# Patient Record
Sex: Female | Born: 1970
Health system: Southern US, Community
[De-identification: ages and names within clinical notes are randomized; demographics above are authoritative.]

## PROBLEM LIST (undated history)

## (undated) DIAGNOSIS — I214 Non-ST elevation (NSTEMI) myocardial infarction: Secondary | ICD-10-CM

## (undated) DIAGNOSIS — F419 Anxiety disorder, unspecified: Secondary | ICD-10-CM

## (undated) DIAGNOSIS — D649 Anemia, unspecified: Secondary | ICD-10-CM

## (undated) DIAGNOSIS — I2510 Atherosclerotic heart disease of native coronary artery without angina pectoris: Secondary | ICD-10-CM

## (undated) DIAGNOSIS — R102 Pelvic and perineal pain: Secondary | ICD-10-CM

## (undated) DIAGNOSIS — R011 Cardiac murmur, unspecified: Secondary | ICD-10-CM

## (undated) DIAGNOSIS — R519 Headache, unspecified: Secondary | ICD-10-CM

## (undated) DIAGNOSIS — I255 Ischemic cardiomyopathy: Secondary | ICD-10-CM

## (undated) DIAGNOSIS — T884XXA Failed or difficult intubation, initial encounter: Secondary | ICD-10-CM

## (undated) DIAGNOSIS — E786 Lipoprotein deficiency: Secondary | ICD-10-CM

## (undated) DIAGNOSIS — R51 Headache: Secondary | ICD-10-CM

## (undated) DIAGNOSIS — N920 Excessive and frequent menstruation with regular cycle: Secondary | ICD-10-CM

## (undated) HISTORY — PX: ABDOMINAL HYSTERECTOMY: SHX81

## (undated) HISTORY — DX: Ischemic cardiomyopathy: I25.5

## (undated) HISTORY — PX: OTHER SURGICAL HISTORY: SHX169

## (undated) HISTORY — DX: Non-ST elevation (NSTEMI) myocardial infarction: I21.4

## (undated) HISTORY — DX: Atherosclerotic heart disease of native coronary artery without angina pectoris: I25.10

## (undated) HISTORY — PX: WISDOM TOOTH EXTRACTION: SHX21

---

## 2008-04-05 ENCOUNTER — Emergency Department (HOSPITAL_COMMUNITY): Admission: EM | Admit: 2008-04-05 | Discharge: 2008-04-05 | Payer: Self-pay | Admitting: Emergency Medicine

## 2008-05-17 ENCOUNTER — Encounter: Admission: RE | Admit: 2008-05-17 | Discharge: 2008-05-17 | Payer: Self-pay | Admitting: Obstetrics and Gynecology

## 2008-06-15 ENCOUNTER — Encounter (INDEPENDENT_AMBULATORY_CARE_PROVIDER_SITE_OTHER): Payer: Self-pay | Admitting: Diagnostic Radiology

## 2008-06-15 ENCOUNTER — Ambulatory Visit (HOSPITAL_COMMUNITY): Admission: RE | Admit: 2008-06-15 | Discharge: 2008-06-15 | Payer: Self-pay | Admitting: Surgery

## 2008-08-08 ENCOUNTER — Encounter (INDEPENDENT_AMBULATORY_CARE_PROVIDER_SITE_OTHER): Payer: Self-pay | Admitting: Surgery

## 2008-08-08 ENCOUNTER — Ambulatory Visit (HOSPITAL_COMMUNITY): Admission: RE | Admit: 2008-08-08 | Discharge: 2008-08-09 | Payer: Self-pay | Admitting: Surgery

## 2009-02-02 IMAGING — US US SOFT TISSUE HEAD/NECK
1 series · 13 of 25 positions shown · non-contrast
Comparison: None.

CLINICAL DATA: Thyroid goiter.

THYROID ULTRASOUND
TECHNIQUE: Ultrasound examination of the thyroid gland and
adjacent soft tissues was performed.

[Series 1: us soft tissue head/neck · 0.11mm/px · 13 of 26 slices shown]
[im 1/26]
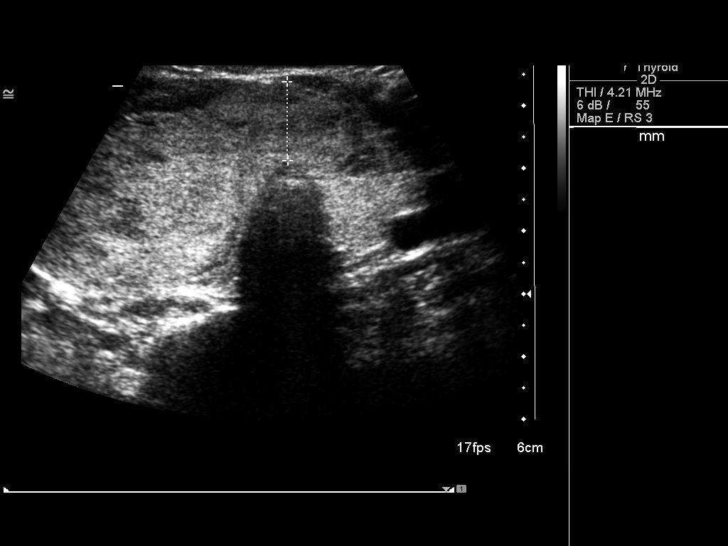
[im 3/26]
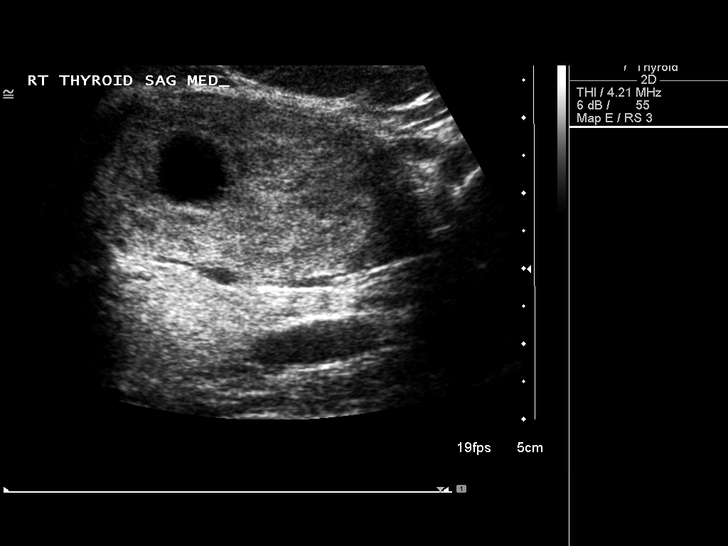
[im 5/26]
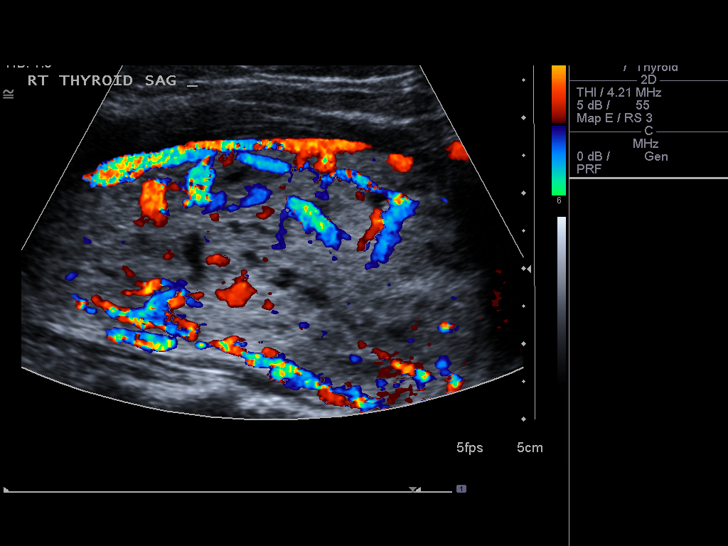
[im 7/26]
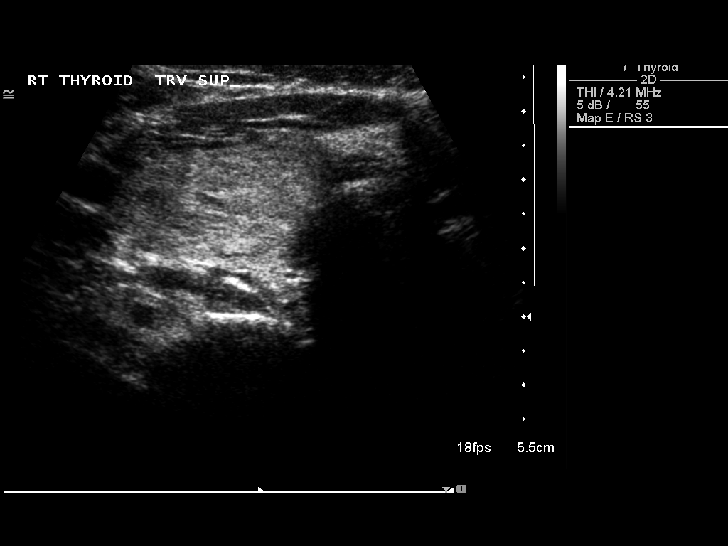
[im 9/26]
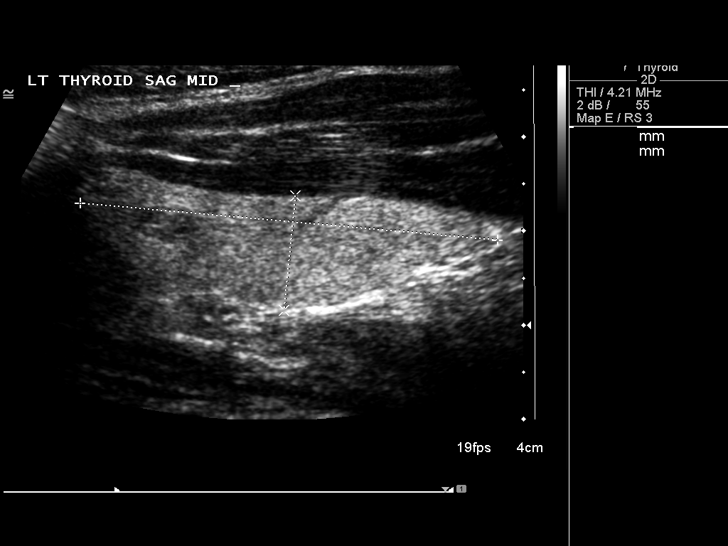
[im 11/26]
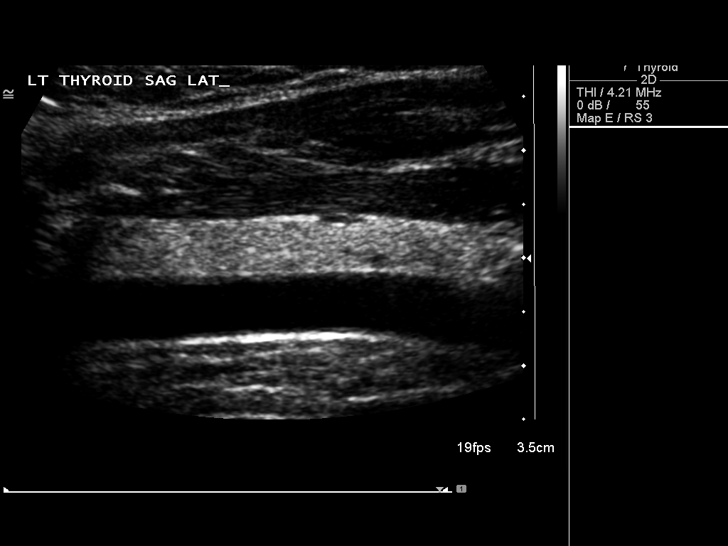
[im 13/26]
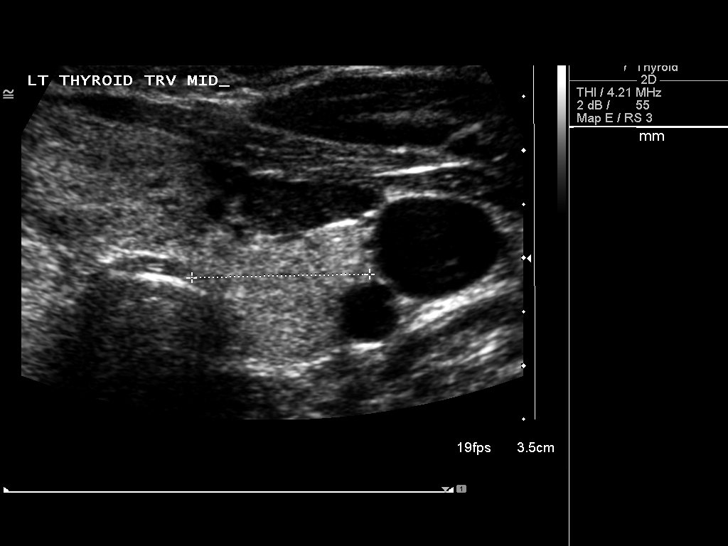
[im 15/26]
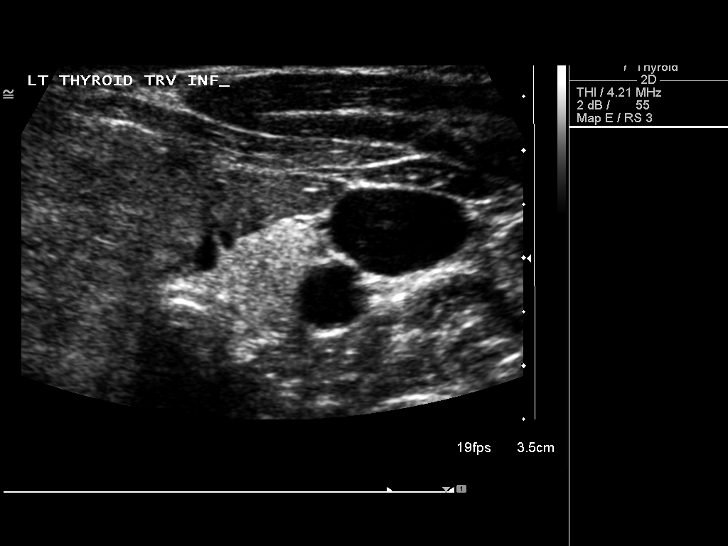
[im 17/26]
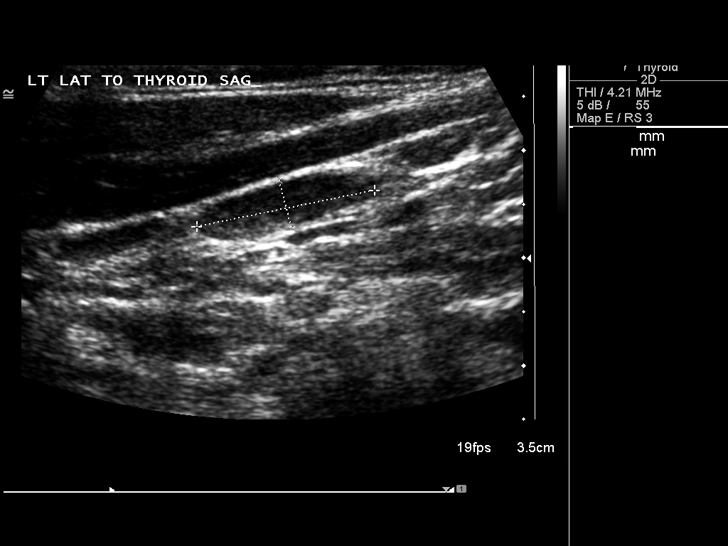
[im 19/26]
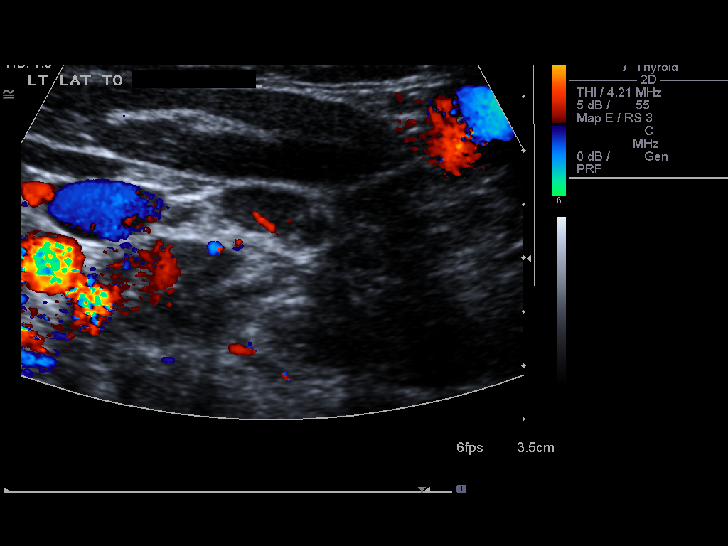
[im 21/26]
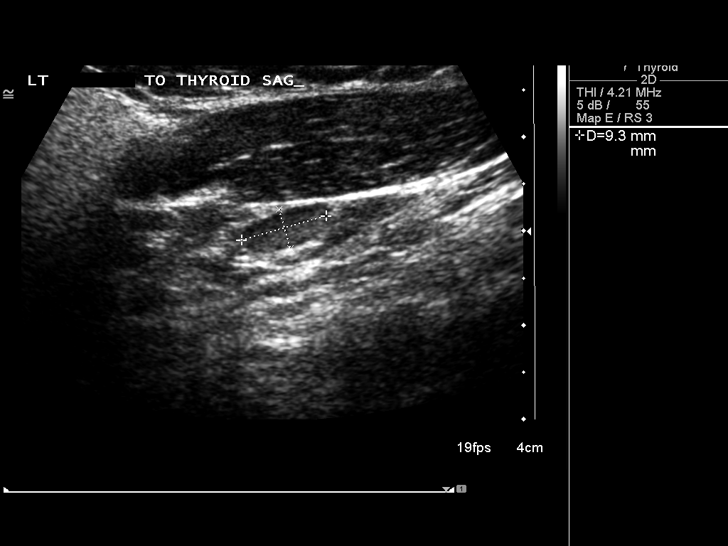
[im 23/26]
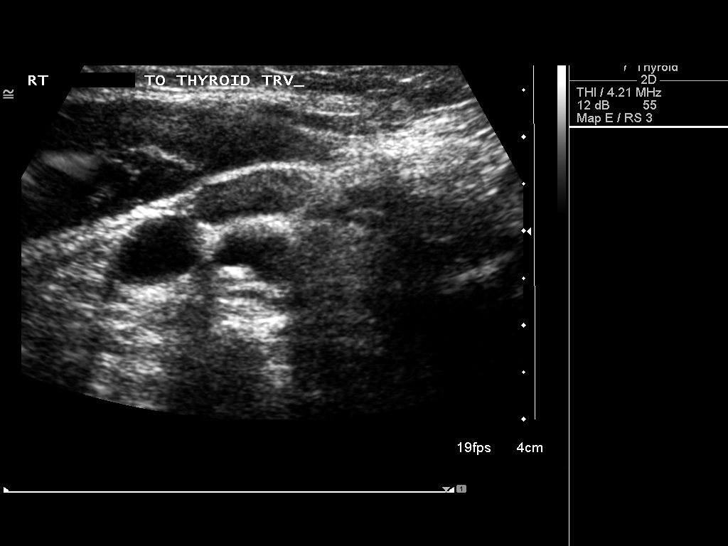
[im 26/26]
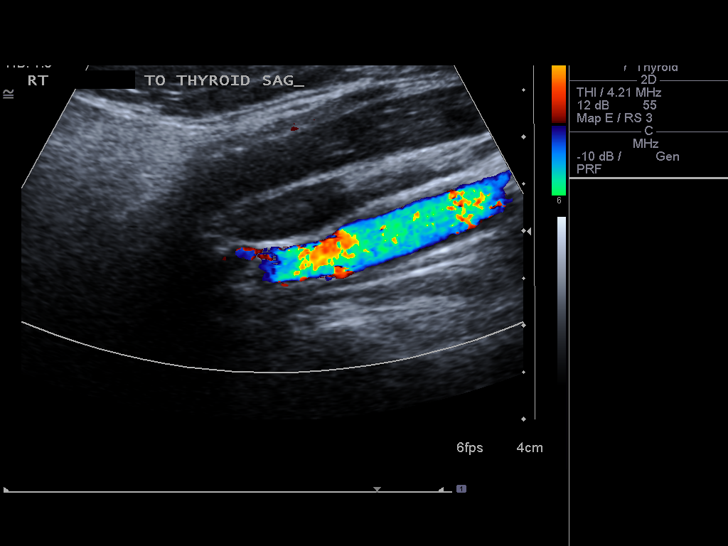

[13 of 25 positions shown; findings below may reference images not displayed]

FINDINGS: Right lobe of the thyroid gland measures 7.1 x 3.3 x
cm.  The left lobe of the thyroid gland measures 4.4 x 1.2 x
cm.  The isthmus measures 1.3 cm.

The entire right lobe of the thyroid gland (extending into the
isthmus) appears heterogeneous with different echogenicity than the
left lobe of the thyroid gland and may represent a dominant mass
replacing a majority of the right lobe.  Fine-needle aspirate
recommended to exclude malignancy.

Within the upper pole of the left lobe of the thyroid gland is a
0.6 x 0.4 x 0.6 cm solid-appearing lesion.  Stability of this can
be confirmed on follow-up.

Lateral to the left lobe of thyroid gland are not what appeared be
lymph nodes spanning over 0.9 and 1.7 cm in length with maximal
transverse dimension is 0.9 cm.
IMPRESSION: The entire right lobe of the thyroid gland (with extension into the
isthmus) appears heterogeneous and may represent a dominant solid
mass.  Fine-needle aspirate recommended to exclude malignancy.

Dominant left upper pole lesion measures up to 0.6 cm.  Stability
can be confirmed on follow-up.

## 2010-09-05 LAB — HEMOGLOBIN AND HEMATOCRIT, BLOOD
HCT: 33.8 % — ABNORMAL LOW (ref 36.0–46.0)
Hemoglobin: 10.9 g/dL — ABNORMAL LOW (ref 12.0–15.0)

## 2010-09-05 LAB — CBC
HCT: 27.5 % — ABNORMAL LOW (ref 36.0–46.0)
Hemoglobin: 9.1 g/dL — ABNORMAL LOW (ref 12.0–15.0)
MCHC: 33.1 g/dL (ref 30.0–36.0)
MCV: 78.3 fL (ref 78.0–100.0)
Platelets: 222 10*3/uL (ref 150–400)
WBC: 7.1 10*3/uL (ref 4.0–10.5)

## 2010-09-05 LAB — CALCIUM: Calcium: 8.1 mg/dL — ABNORMAL LOW (ref 8.4–10.5)

## 2010-10-08 NOTE — Op Note (Signed)
NAMEJASHAE, [name]                ACCOUNT NO.:  [id number]   MEDICAL RECORD NO.:  [id number]          PATIENT TYPE:  OIB   LOCATION:  1526                         FACILITY:  [facility]   PHYSICIAN:  Thornton Park. Daphine Deutscher, MD  DATE OF BIRTH:  16-Jan-1971   DATE OF PROCEDURE:  08/08/2008  DATE OF DISCHARGE:                               OPERATIVE REPORT   PREOPERATIVE DIAGNOSIS:  Right thyroid mass in a 41 year old lady with  follicular cells seen on thyroid aspirate and a history of right-sided  torticollis.   PROCEDURE:  Right thyroid lobectomy.   SURGEON:  Thornton Park. Daphine Deutscher, MD.   ASSISTANT:  Clovis Pu. Cornett, M.D.   ANESTHESIA:  General endotracheal.   DESCRIPTION OF PROCEDURE:  [name] was taken to room 11 on August 08, 2008 and given general anesthesia.  The neck was hyperextended by first  putting a roll parallel to the spine between the shoulder blades and  then hyperextending her neck.  The neck was prepped with a chlorhexidine  equivalent and draped sterilely.  The mass was very palpable and  distorting the anatomy on the right but I found 2 fingerbreadths above  the clavicle on both sides a nice skin crease and went ahead and  confined my incision at that location.  The incision was made and  carried down below the platysma and then the subplatysmal flaps were  generated.  I was operating from the left side and concentrated on the  patient's right side and began a tedious dissection because she had a  lot of stickiness around this lobe consistent with perhaps either the  aspirate or thyroiditis.  We went ahead and mobilized this and I stayed  right on the gland, using medium clips where necessary and the Harmonic  scalpel.  I went up and identified the superior pole after first  ligating the middle thyroid vein.  I mobilized the superior pole,  getting right at the top and perhaps leaving a little bit of tissue at  the superior pole, which I ligated with 2-0 silks, clips,  and then used  the Harmonic scalpel.  I mobilized the inferior pole, then came up  inferiorly, identifying the inferior arterial feeders.  I stayed on the  gland, teasing that away, and could see where the recurrent laryngeal  nerve would be entering up near the ligament of Allyson Sabal and again stayed  on the gland and did not appear to interrupt the blood supply to what  was probably the location of the parathyroids nor interfere with the  recurrent laryngeal nerve.  This was then divided in the midline with  the Harmonic scalpel.  We then sent the specimen off and observed the  bed for bleeding.  We  irrigated and no bleeding was seen.  I did put some little tabs of  Surgicel in the thyroid bed.  I closed the strap muscles with  interrupted 4-0 Vicryl and then the platysma with interrupted 5-0  Monocryl.  Staples were used intermittently on the skin.  The patient  was awakened and taken to the recovery room  in satisfactory condition.      Thornton Park Daphine Deutscher, MD  Electronically Signed     MBM/MEDQ  D:  08/08/2008  T:  08/08/2008  Job:  [id number]   cc:   C. Duane Lope, M.D.  Fax: [phone]

## 2011-02-25 LAB — CK TOTAL AND CKMB (NOT AT ARMC)
CK, MB: 3.6
Relative Index: 1
Total CK: 362 — ABNORMAL HIGH
Total CK: 366 — ABNORMAL HIGH

## 2011-08-10 ENCOUNTER — Emergency Department (HOSPITAL_COMMUNITY)
Admission: EM | Admit: 2011-08-10 | Discharge: 2011-08-10 | Disposition: A | Discharge status: Home / Self Care | Payer: Federal, State, Local not specified - PPO | Attending: Emergency Medicine | Admitting: Emergency Medicine

## 2011-08-10 ENCOUNTER — Encounter (HOSPITAL_COMMUNITY): Payer: Self-pay | Admitting: *Deleted

## 2011-08-10 ENCOUNTER — Emergency Department (HOSPITAL_COMMUNITY): Payer: Federal, State, Local not specified - PPO

## 2011-08-10 DIAGNOSIS — N83209 Unspecified ovarian cyst, unspecified side: Secondary | ICD-10-CM

## 2011-08-10 DIAGNOSIS — K8020 Calculus of gallbladder without cholecystitis without obstruction: Secondary | ICD-10-CM | POA: Insufficient documentation

## 2011-08-10 DIAGNOSIS — R109 Unspecified abdominal pain: Secondary | ICD-10-CM | POA: Insufficient documentation

## 2011-08-10 DIAGNOSIS — N200 Calculus of kidney: Secondary | ICD-10-CM | POA: Insufficient documentation

## 2011-08-10 HISTORY — DX: Anxiety disorder, unspecified: F41.9

## 2011-08-10 LAB — POCT PREGNANCY, URINE: Preg Test, Ur: NEGATIVE

## 2011-08-10 LAB — COMPREHENSIVE METABOLIC PANEL
ALT: 24 U/L (ref 0–35)
Calcium: 9.1 mg/dL (ref 8.4–10.5)
Creatinine, Ser: 0.83 mg/dL (ref 0.50–1.10)
GFR calc non Af Amer: 85 mL/min — ABNORMAL LOW (ref >90)
Glucose, Bld: 149 mg/dL — ABNORMAL HIGH (ref 70–99)
Potassium: 3.4 mEq/L — ABNORMAL LOW (ref 3.5–5.1)

## 2011-08-10 LAB — URINE MICROSCOPIC-ADD ON

## 2011-08-10 LAB — LIPASE, BLOOD: Lipase: 51 U/L (ref 11–59)

## 2011-08-10 LAB — CBC
MCH: 22.2 pg — ABNORMAL LOW (ref 26.0–34.0)
MCHC: 31.2 g/dL (ref 30.0–36.0)
Platelets: 275 10*3/uL (ref 150–400)
RBC: 3.97 MIL/uL (ref 3.87–5.11)
WBC: 6.6 10*3/uL (ref 4.0–10.5)

## 2011-08-10 LAB — URINALYSIS, ROUTINE W REFLEX MICROSCOPIC
Protein, ur: NEGATIVE mg/dL
Specific Gravity, Urine: 1.011 (ref 1.005–1.030)
pH: 7.5 (ref 5.0–8.0)

## 2011-08-10 LAB — DIFFERENTIAL
Lymphs Abs: 1.7 10*3/uL (ref 0.7–4.0)
Monocytes Absolute: 0.5 10*3/uL (ref 0.1–1.0)
Monocytes Relative: 7 % (ref 3–12)
Neutro Abs: 4.3 10*3/uL (ref 1.7–7.7)
Neutrophils Relative %: 65 % (ref 43–77)

## 2011-08-10 MED ORDER — HYDROCODONE-ACETAMINOPHEN 5-325 MG PO TABS: 1.0000 | ORAL_TABLET | ORAL | Status: AC | PRN

## 2011-08-10 MED ORDER — ONDANSETRON HCL 4 MG/2ML IJ SOLN
4.0000 mg | Freq: Once | INTRAMUSCULAR | Status: AC
  Administered 2011-08-10: 4 mg via INTRAVENOUS
  Filled 2011-08-10: qty 2

## 2011-08-10 MED ORDER — MORPHINE SULFATE 4 MG/ML IJ SOLN
4.0000 mg | Freq: Once | INTRAMUSCULAR | Status: AC
  Administered 2011-08-10: 4 mg via INTRAVENOUS
  Filled 2011-08-10: qty 1

## 2011-08-10 NOTE — ED Notes (Signed)
Patient given discharge instructions, information, prescriptions, and diet order. Patient states that they adequately understand discharge information given and to return to ED if symptoms return or worsen.     

## 2011-08-10 NOTE — ED Provider Notes (Signed)
History     CSN: [id number]  Arrival date & time 08/10/11  4098   First MD Initiated Contact with Patient 08/10/11 2031      Chief Complaint  Patient presents with  . Flank Pain     HPI  History provided by the patient. Patient is a 41 year old female with history of anxiety who presents with complaints of increasing right flank and back pain that began this evening. Pain is sharp and cramping. Pain seemed to become worse after eating. Patient did have some radiation of pain to right groin but this resolved. Patient reports having similar episode 2 weeks ago but it resolved on its own overnight. Symptoms are associated with slight nausea. Patient denies having fever, chills, vomiting, diarrhea, constipation, dysuria, hematuria, urinary frequency. Patient is currently menstruating. She denies lower abdomen or pelvic cramps. Patient has no significant history of surgery on abdomen.    Past Medical History  Diagnosis Date  . Anxiety     Past Surgical History  Procedure Date  . Goiter     removed it    No family history on file.  History  Substance Use Topics  . Smoking status: Not on file  . Smokeless tobacco: Not on file  . Alcohol Use: No    OB History    Grav Para Term Preterm Abortions TAB SAB Ect Mult Living                  Review of Systems  Constitutional: Negative for fever and chills.  Respiratory: Negative for shortness of breath.   Cardiovascular: Negative for chest pain.  Gastrointestinal: Positive for nausea. Negative for vomiting, abdominal pain, diarrhea and constipation.  Genitourinary: Positive for flank pain and vaginal bleeding. Negative for dysuria, frequency, hematuria and vaginal discharge.  All other systems reviewed and are negative.    Allergies  Bactrim  Home Medications   Current Outpatient Rx  Name Route Sig Dispense Refill  . ACETAMINOPHEN 500 MG PO TABS Oral Take 500 mg by mouth every 6 (six) hours as needed. For pain    .  CALCIUM CARBONATE 600 MG PO TABS Oral Take 600 mg by mouth daily.    Marland Kitchen VITAMIN D 1000 UNITS PO TABS Oral Take 1,000 Units by mouth daily.    Marland Kitchen ROSUVASTATIN CALCIUM 10 MG PO TABS Oral Take 10 mg by mouth daily.    . VENLAFAXINE HCL 100 MG PO TABS Oral Take 100 mg by mouth 2 (two) times daily.      BP 159/75  Pulse 92  Temp(Src) 98.5 F (36.9 C) (Oral)  Resp 16  SpO2 99%  LMP 08/10/2011  Physical Exam  Nursing note and vitals reviewed. Constitutional: She is oriented to person, place, and time. She appears well-developed and well-nourished. No distress.  HENT:  Head: Normocephalic.  Cardiovascular: Normal rate and regular rhythm.   Pulmonary/Chest: Effort normal and breath sounds normal. No respiratory distress. She has no wheezes. She has no rales.  Abdominal: Soft. She exhibits no distension. There is CVA tenderness. There is no rebound, no guarding and negative Murphy's sign.       Right CVA tenderness  Musculoskeletal: She exhibits no edema and no tenderness.  Neurological: She is alert and oriented to person, place, and time.  Skin: Skin is warm and dry. No rash noted.  Psychiatric: She has a normal mood and affect. Her behavior is normal.    ED Course  Procedures   Results for orders placed during the hospital  encounter of 08/10/11  URINALYSIS, ROUTINE W REFLEX MICROSCOPIC      Component Value Range   Color, Urine YELLOW  YELLOW    APPearance CLEAR  CLEAR    Specific Gravity, Urine 1.011  1.005 - 1.030    pH 7.5  5.0 - 8.0    Glucose, UA NEGATIVE  NEGATIVE (mg/dL)   Hgb urine dipstick SMALL (*) NEGATIVE    Bilirubin Urine NEGATIVE  NEGATIVE    Ketones, ur NEGATIVE  NEGATIVE (mg/dL)   Protein, ur NEGATIVE  NEGATIVE (mg/dL)   Urobilinogen, UA 0.2  0.0 - 1.0 (mg/dL)   Nitrite NEGATIVE  NEGATIVE    Leukocytes, UA NEGATIVE  NEGATIVE   CBC      Component Value Range   WBC 6.6  4.0 - 10.5 (K/uL)   RBC 3.97  3.87 - 5.11 (MIL/uL)   Hemoglobin 8.8 (*) 12.0 - 15.0 (g/dL)     HCT 21.3 (*) 08.6 - 46.0 (%)   MCV 71.0 (*) 78.0 - 100.0 (fL)   MCH 22.2 (*) 26.0 - 34.0 (pg)   MCHC 31.2  30.0 - 36.0 (g/dL)   RDW 57.8 (*) 46.9 - 15.5 (%)   Platelets 275  150 - 400 (K/uL)  DIFFERENTIAL      Component Value Range   Neutrophils Relative 65  43 - 77 (%)   Neutro Abs 4.3  1.7 - 7.7 (K/uL)   Lymphocytes Relative 26  12 - 46 (%)   Lymphs Abs 1.7  0.7 - 4.0 (K/uL)   Monocytes Relative 7  3 - 12 (%)   Monocytes Absolute 0.5  0.1 - 1.0 (K/uL)   Eosinophils Relative 1  0 - 5 (%)   Eosinophils Absolute 0.1  0.0 - 0.7 (K/uL)   Basophils Relative 0  0 - 1 (%)   Basophils Absolute 0.0  0.0 - 0.1 (K/uL)  COMPREHENSIVE METABOLIC PANEL      Component Value Range   Sodium 134 (*) 135 - 145 (mEq/L)   Potassium 3.4 (*) 3.5 - 5.1 (mEq/L)   Chloride 99  96 - 112 (mEq/L)   CO2 27  19 - 32 (mEq/L)   Glucose, Bld 149 (*) 70 - 99 (mg/dL)   BUN 10  6 - 23 (mg/dL)   Creatinine, Ser 6.29  0.50 - 1.10 (mg/dL)   Calcium 9.1  8.4 - 52.8 (mg/dL)   Total Protein 7.2  6.0 - 8.3 (g/dL)   Albumin 3.8  3.5 - 5.2 (g/dL)   AST 27  0 - 37 (U/L)   ALT 24  0 - 35 (U/L)   Alkaline Phosphatase 55  39 - 117 (U/L)   Total Bilirubin 0.2 (*) 0.3 - 1.2 (mg/dL)   GFR calc non Af Amer 85 (*) >90 (mL/min)   GFR calc Af Amer >90  >90 (mL/min)  LIPASE, BLOOD      Component Value Range   Lipase 51  11 - 59 (U/L)  POCT PREGNANCY, URINE      Component Value Range   Preg Test, Ur NEGATIVE  NEGATIVE   URINE MICROSCOPIC-ADD ON      Component Value Range   Squamous Epithelial / LPF RARE  RARE    RBC / HPF 3-6  <3 (RBC/hpf)   Bacteria, UA RARE  RARE      Ct Abdomen Pelvis Wo Contrast  08/10/2011  *RADIOLOGY REPORT*  Clinical Data: evaluate for kidney stone  CT ABDOMEN AND PELVIS WITHOUT CONTRAST  Technique:  Multidetector CT imaging of  the abdomen and pelvis was performed following the standard protocol without intravenous contrast.  Comparison: Rule none  Findings: Lung bases are clear.  There is a stone  identified within the neck of the gallbladder measuring 8 mm.  No focal liver abnormality.  There is no biliary dilatation.  The pancreas appears within normal limits.  The spleen appears normal.  Left adrenal nodule measures 0.9 cm and 19 HU.  The right adrenal gland is normal.  Normal appearance of the right kidney.  The inferior pole of the left kidney there is a stone measuring 0.6 cm, image 28.  No right hydronephrosis or hydroureter.  There is no left-sided hydronephrosis or hydroureter. No ureterolithiasis identified. Urinary bladder is collapsed around a Foley catheter.  No enlarged upper abdominal lymph nodes.  There is no pelvic or inguinal adenopathy.  Left ovarian cyst measures 4.1 x 3.3 cm.  Septated cyst within the right ovary measures 4.7 x 3.2 x 5.3 cm.  Enlarged fibroid uterus is identified.  The cervix appears thickened and there are multiple cystic structures identified.  There is no free fluid identified within the pelvis.  IMPRESSION:  1.  Nonobstructing left renal calculus. 2.  Bilateral ovarian cysts.  Within the right ovary there is a large septated cyst.  Suggest further imaging with pelvic sonogram. 3.  Thickening and cystic changes involving the cervix. In a patient presenting with chronic vaginal bleeding, gynecologic follow up would be advised.  Original Report Authenticated By: Rosealee Albee, M.D.     1. Cholelithiasis   2. Ovarian cyst       MDM  8:45 PM patient seen and evaluated. Patient no acute distress.   Pt feeling better after pain medications.  I have discussed with pt and husband lab results and CT scan.  No obstructing kidney stones.  8mm gallstone.  Pt also with ovarian cysts and thickening of cervix.  Pt does state that she has upcoming appointment with OB/GYN on Thursday.  I will also provide referral for general surgeon for follow up of gallstone.  Pt was discussed with attending physician.  He agrees with plan.   Angus Seller, [place] 08/11/11 2326

## 2011-08-10 NOTE — ED Notes (Signed)
Pt began having pain x 2 weeks ago that has been intermittent since that point.  Pt began to have pain again this evening just before dinner, but attributed the pain to being tired.  Pt's pain became worse after eating.  Pt states she has been nauseated without vomitus or diarrhea.

## 2011-08-10 NOTE — ED Notes (Signed)
Pt sts that she is having extreme pain and discomfort in her right lower flank/ back pain. Pt sts that the pain is also in her left lower flank/back but that it is much worse on her right side. Patient sts that she had this pain earlier in the week, took tylenol and felt better. sts pain today is worse than earlier this week. Patient sts she is very nauseated.

## 2011-08-10 NOTE — Discharge Instructions (Signed)
You were seen and evaluated today for your right back and side pains. Your lab tests today have not shown any concerning signs for your symptoms. Your CAT scan today does show that you have gallstones. At this time your providers feel this may be the cause of your symptoms of pain. You have been given a referral for the general surgeon to followup with these symptoms. Your CAT scan today also showed that you have ovarian cysts and changes around your uterus. Please keep your appointment with your OB/GYN doctor tomorrow for continued evaluation and workup. If you develop any worsening pain, persistent nausea vomiting, fever, chills please return to the emergency room.  Biliary Colic  Biliary colic is a steady or irregular pain in the upper abdomen. It is usually under the right side of the rib cage. It happens when gallstones interfere with the normal flow of bile from the gallbladder. Bile is a liquid that helps to digest fats. Bile is made in the liver and stored in the gallbladder. When you eat a meal, bile passes from the gallbladder through the cystic duct and the common bile duct into the small intestine. There, it mixes with partially digested food. If a gallstone blocks either of these ducts, the normal flow of bile is blocked. The muscle cells in the bile duct contract forcefully to try to move the stone. This causes the pain of biliary colic.  SYMPTOMS   A person with biliary colic usually complains of pain in the upper abdomen. This pain can be:   In the center of the upper abdomen just below the breastbone.   In the upper-right part of the abdomen, near the gallbladder and liver.   Spread back toward the right shoulder blade.   Nausea and vomiting.   The pain usually occurs after eating.   Biliary colic is usually triggered by the digestive system's demand for bile. The demand for bile is high after fatty meals. Symptoms can also occur when a person who has been fasting suddenly eats a  very large meal. Most episodes of biliary colic pass after 1 to 5 hours. After the most intense pain passes, your abdomen may continue to ache mildly for about 24 hours.  DIAGNOSIS  After you describe your symptoms, your caregiver will perform a physical exam. He or she will pay attention to the upper right portion of your belly (abdomen). This is the area of your liver and gallbladder. An ultrasound will help your caregiver look for gallstones. Specialized scans of the gallbladder may also be done. Blood tests may be done, especially if you have fever or if your pain persists. PREVENTION  Biliary colic can be prevented by controlling the risk factors for gallstones. Some of these risk factors, such as heredity, increasing age, and pregnancy are a normal part of life. Obesity and a high-fat diet are risk factors you can change through a healthy lifestyle. Women going through menopause who take hormone replacement therapy (estrogen) are also more likely to develop biliary colic. TREATMENT   Pain medication may be prescribed.   You may be encouraged to eat a fat-free diet.   If the first episode of biliary colic is severe, or episodes of colic keep retuning, surgery to remove the gallbladder (cholecystectomy) is usually recommended. This procedure can be done through small incisions using an instrument called a laparoscope. The procedure often requires a brief stay in the hospital. Some people can leave the hospital the same day. It is the most  widely used treatment in people troubled by painful gallstones. It is effective and safe, with no complications in more than 90% of cases.   If surgery cannot be done, medication that dissolves gallstones may be used. This medication is expensive and can take months or years to work. Only small stones will dissolve.   Rarely, medication to dissolve gallstones is combined with a procedure called shock-wave lithotripsy. This procedure uses carefully aimed shock  waves to break up gallstones. In many people treated with this procedure, gallstones form again within a few years.  PROGNOSIS  If gallstones block your cystic duct or common bile duct, you are at risk for repeated episodes of biliary colic. There is also a 25% chance that you will develop a gallbladder infection(acute cholecystitis), or some other complication of gallstones within 10 to 20 years. If you have surgery, schedule it at a time that is convenient for you and at a time when you are not sick. HOME CARE INSTRUCTIONS   Drink plenty of clear fluids.   Avoid fatty, greasy or fried foods, or any foods that make your pain worse.   Take medications as directed.  SEEK MEDICAL CARE IF:   You develop a fever over 100.5 F (38.1 C).   Your pain gets worse over time.   You develop nausea that prevents you from eating and drinking.   You develop vomiting.  SEEK IMMEDIATE MEDICAL CARE IF:   You have continuous or severe belly (abdominal) pain which is not relieved with medications.   You develop nausea and vomiting which is not relieved with medications.   You have symptoms of biliary colic and you suddenly develop a fever and shaking chills. This may signal cholecystitis. Call your caregiver immediately.   You develop a yellow color to your skin or the white part of your eyes (jaundice).  Document Released: 10/13/2005 Document Revised: 05/01/2011 Document Reviewed: 12/23/2007 [facility] Patient Information 2012 [place], [place].   Cholelithiasis Cholelithiasis (also called gallstones) is a form of gallbladder disease where gallstones form in your gallbladder. The gallbladder is a non-essential organ that stores bile made in the liver, which helps digest fats. Gallstones begin as small crystals and slowly grow into stones. Gallstone pain occurs when the gallbladder spasms, and a gallstone is blocking the duct. Pain can also occur when a stone passes out of the duct.  Women are more likely  to develop gallstones than men. Other factors that increase the risk of gallbladder disease are:  Having multiple pregnancies. Physicians sometimes advise removing diseased gallbladders before future pregnancies.   Obesity.   Diets heavy in fried foods and fat.   Increasing age (older than 1).   Prolonged use of medications containing female hormones.   Diabetes mellitus.   Rapid weight loss.   Family history of gallstones (heredity).  SYMPTOMS  Feeling sick to your stomach (nauseous).   Abdominal pain.   Yellowing of the skin (jaundice).   Sudden pain. It may persist from several minutes to several hours.   Worsening pain with deep breathing or when jarred.   Fever.   Tenderness to the touch.  In some cases, when gallstones do not move into the bile duct, people have no pain or symptoms. These are called "silent" gallstones. TREATMENT In severe cases, emergency surgery may be required. HOME CARE INSTRUCTIONS   Only take over-the-counter or prescription medicines for pain, discomfort, or fever as directed by your caregiver.   Follow a low-fat diet until seen again. Fat causes  the gallbladder to contract, which can result in pain.   Follow up as instructed. Attacks are almost always recurrent and surgery is usually required for permanent treatment.  SEEK IMMEDIATE MEDICAL CARE IF:   Your pain increases and is not controlled by medications.   You have an oral temperature above 102 F (38.9 C), not controlled by medication.   You develop nausea and vomiting.  MAKE SURE YOU:   Understand these instructions.   Will watch your condition.   Will get help right away if you are not doing well or get worse.  Document Released: 05/08/2005 Document Revised: 05/01/2011 Document Reviewed: 07/11/2010 [facility] Patient Information 2012 [place], [place].   Ovarian Cyst The ovaries are small organs that are on each side of the uterus. The ovaries are the organs that produce  the female hormones, estrogen and progesterone. An ovarian cyst is a sac filled with fluid that can vary in its size. It is normal for a small cyst to form in women who are in the childbearing age and who have menstrual periods. This type of cyst is called a follicle cyst that becomes an ovulation cyst (corpus luteum cyst) after it produces the women's egg. It later goes away on its own if the woman does not become pregnant. There are other kinds of ovarian cysts that may cause problems and may need to be treated. The most serious problem is a cyst with cancer. It should be noted that menopausal women who have an ovarian cyst are at a higher risk of it being a cancer cyst. They should be evaluated very quickly, thoroughly and followed closely. This is especially true in menopausal women because of the high rate of ovarian cancer in women in menopause. CAUSES AND TYPES OF OVARIAN CYSTS:  FUNCTIONAL CYST: The follicle/corpus luteum cyst is a functional cyst that occurs every month during ovulation with the menstrual cycle. They go away with the next menstrual cycle if the woman does not get pregnant. Usually, there are no symptoms with a functional cyst.   ENDOMETRIOMA CYST: This cyst develops from the lining of the uterus tissue. This cyst gets in or on the ovary. It grows every month from the bleeding during the menstrual period. It is also called a "chocolate cyst" because it becomes filled with blood that turns brown. This cyst can cause pain in the lower abdomen during intercourse and with your menstrual period.   CYSTADENOMA CYST: This cyst develops from the cells on the outside of the ovary. They usually are not cancerous. They can get very big and cause lower abdomen pain and pain with intercourse. This type of cyst can twist on itself, cut off its blood supply and cause severe pain. It also can easily rupture and cause a lot of pain.   DERMOID CYST: This type of cyst is sometimes found in both  ovaries. They are found to have different kinds of body tissue in the cyst. The tissue includes skin, teeth, hair, and/or cartilage. They usually do not have symptoms unless they get very big. Dermoid cysts are rarely cancerous.   POLYCYSTIC OVARY: This is a rare condition with hormone problems that produces many small cysts on both ovaries. The cysts are follicle-like cysts that never produce an egg and become a corpus luteum. It can cause an increase in body weight, infertility, acne, increase in body and facial hair and lack of menstrual periods or rare menstrual periods. Many women with this problem develop type 2 diabetes. The exact  cause of this problem is unknown. A polycystic ovary is rarely cancerous.   THECA LUTEIN CYST: Occurs when too much hormone (human chorionic gonadotropin) is produced and over-stimulates the ovaries to produce an egg. They are frequently seen when doctors stimulate the ovaries for invitro-fertilization (test tube babies).   LUTEOMA CYST: This cyst is seen during pregnancy. Rarely it can cause an obstruction to the birth canal during labor and delivery. They usually go away after delivery.  SYMPTOMS   Pelvic pain or pressure.   Pain during sexual intercourse.   Increasing girth (swelling) of the abdomen.   Abnormal menstrual periods.   Increasing pain with menstrual periods.   You stop having menstrual periods and you are not pregnant.  DIAGNOSIS  The diagnosis can be made during:  Routine or annual pelvic examination (common).   Ultrasound.   X-ray of the pelvis.   CT Scan.   MRI.   Blood tests.  TREATMENT   Treatment may only be to follow the cyst monthly for 2 to 3 months with your caregiver. Many go away on their own, especially functional cysts.   May be aspirated (drained) with a long needle with ultrasound, or by laparoscopy (inserting a tube into the pelvis through a small incision).   The whole cyst can be removed by laparoscopy.    Sometimes the cyst may need to be removed through an incision in the lower abdomen.   Hormone treatment is sometimes used to help dissolve certain cysts.   Birth control pills are sometimes used to help dissolve certain cysts.  HOME CARE INSTRUCTIONS  Follow your caregiver's advice regarding:  Medicine.   Follow up visits to evaluate and treat the cyst.   You may need to come back or make an appointment with another caregiver, to find the exact cause of your cyst, if your caregiver is not a gynecologist.   Get your yearly and recommended pelvic examinations and Pap tests.   Let your caregiver know if you have had an ovarian cyst in the past.  SEEK MEDICAL CARE IF:   Your periods are late, irregular, they stop, or are painful.   Your stomach (abdomen) or pelvic pain does not go away.   Your stomach becomes larger or swollen.   You have pressure on your bladder or trouble emptying your bladder completely.   You have painful sexual intercourse.   You have feelings of fullness, pressure, or discomfort in your stomach.   You lose weight for no apparent reason.   You feel generally ill.   You become constipated.   You lose your appetite.   You develop acne.   You have an increase in body and facial hair.   You are gaining weight, without changing your exercise and eating habits.   You think you are pregnant.  SEEK IMMEDIATE MEDICAL CARE IF:   You have increasing abdominal pain.   You feel sick to your stomach (nausea) and/or vomit.   You develop a fever that comes on suddenly.   You develop abdominal pain during a bowel movement.   Your menstrual periods become heavier than usual.  Document Released: 05/12/2005 Document Revised: 05/01/2011 Document Reviewed: 03/15/2009 [facility] Patient Information 2012 [place], [place].

## 2011-08-13 NOTE — ED Provider Notes (Signed)
Medical screening examination/treatment/procedure(s) were performed by non-physician practitioner and as supervising physician I was immediately available for consultation/collaboration.   Landis Cassaro, MD 08/13/11 0824 

## 2011-08-21 ENCOUNTER — Encounter (INDEPENDENT_AMBULATORY_CARE_PROVIDER_SITE_OTHER): Payer: Federal, State, Local not specified - PPO | Admitting: General Surgery

## 2011-08-26 ENCOUNTER — Other Ambulatory Visit: Payer: Self-pay | Admitting: Obstetrics and Gynecology

## 2011-08-27 ENCOUNTER — Inpatient Hospital Stay (HOSPITAL_COMMUNITY): Admission: RE | Admit: 2011-08-27 | Payer: Federal, State, Local not specified - PPO | Source: Ambulatory Visit

## 2011-08-27 ENCOUNTER — Other Ambulatory Visit: Payer: Self-pay | Admitting: Obstetrics and Gynecology

## 2011-08-27 ENCOUNTER — Other Ambulatory Visit (HOSPITAL_COMMUNITY): Payer: Self-pay | Admitting: *Deleted

## 2011-09-02 ENCOUNTER — Encounter (HOSPITAL_COMMUNITY)
Admission: RE | Admit: 2011-09-02 | Discharge: 2011-09-02 | Disposition: A | Discharge status: Home / Self Care | Payer: Federal, State, Local not specified - PPO | Source: Ambulatory Visit | Attending: Obstetrics and Gynecology | Admitting: Obstetrics and Gynecology

## 2011-09-02 ENCOUNTER — Encounter (HOSPITAL_COMMUNITY): Payer: Self-pay

## 2011-09-02 DIAGNOSIS — D649 Anemia, unspecified: Secondary | ICD-10-CM | POA: Insufficient documentation

## 2011-09-02 HISTORY — DX: Lipoprotein deficiency: E78.6

## 2011-09-02 HISTORY — DX: Anemia, unspecified: D64.9

## 2011-09-02 HISTORY — DX: Cardiac murmur, unspecified: R01.1

## 2011-09-02 MED ORDER — FERUMOXYTOL INJECTION 510 MG/17 ML
510.0000 mg | INTRAVENOUS | Status: DC
  Administered 2011-09-02: 510 mg via INTRAVENOUS

## 2011-09-02 MED ORDER — FERUMOXYTOL INJECTION 510 MG/17 ML
INTRAVENOUS | Status: AC
  Filled 2011-09-02: qty 17

## 2011-09-02 MED ORDER — SODIUM CHLORIDE 0.9 % IV SOLN
INTRAVENOUS | Status: DC
  Administered 2011-09-02: 14:00:00 via INTRAVENOUS

## 2011-09-02 NOTE — Discharge Instructions (Signed)
Next appointment is Tuesday 09/09/11 at 1:30   Short Stay [phone]    Call your doctor for any problems.   Ferumoxytol injection What is this medicine? FERUMOXYTOL is an iron complex. Iron is used to make healthy red blood cells, which carry oxygen and nutrients throughout the body. This medicine is used to treat iron deficiency anemia in people with chronic kidney disease. This medicine may be used for other purposes; ask your health care provider or pharmacist if you have questions. What should I tell my health care provider before I take this medicine? They need to know if you have any of these conditions: -anemia not caused by low iron levels -high levels of iron in the blood -magnetic resonance imaging (MRI) test scheduled -an unusual or allergic reaction to iron, other medicines, foods, dyes, or preservatives -pregnant or trying to get pregnant -breast-feeding How should I use this medicine? This medicine is for infusion into a vein. It is given by a health care professional in a hospital or clinic setting. Talk to your pediatrician regarding the use of this medicine in children. Special care may be needed. Overdosage: If you think you've taken too much of this medicine contact a poison control center or emergency room at once. Overdosage: If you think you have taken too much of this medicine contact a poison control center or emergency room at once. NOTE: This medicine is only for you. Do not share this medicine with others. What if I miss a dose? It is important not to miss your dose. Call your doctor or health care professional if you are unable to keep an appointment. What may interact with this medicine? This medicine may interact with the following medications: -other iron products This list may not describe all possible interactions. Give your health care provider a list of all the medicines, herbs, non-prescription drugs, or dietary supplements you use. Also tell them if you  smoke, drink alcohol, or use illegal drugs. Some items may interact with your medicine. What should I watch for while using this medicine? Visit your doctor or healthcare professional regularly. Tell your doctor or healthcare professional if your symptoms do not start to get better or if they get worse. You may need blood work done while you are taking this medicine. You may need to follow a special diet. Talk to your doctor. Foods that contain iron include: whole grains/cereals, dried fruits, beans, or peas, leafy green vegetables, and organ meats (liver, kidney). What side effects may I notice from receiving this medicine? Side effects that you should report to your doctor or health care professional as soon as possible: -allergic reactions like skin rash, itching or hives, swelling of the face, lips, or tongue -breathing problems -changes in blood pressure -feeling faint or lightheaded, falls -fever or chills -flushing, sweating, or hot feelings -swelling of the ankles or feet Side effects that usually do not require medical attention (Report these to your doctor or health care professional if they continue or are bothersome.): -diarrhea -headache -nausea, vomiting -stomach pain This list may not describe all possible side effects. Call your doctor for medical advice about side effects. You may report side effects to FDA at 1-800-FDA-1088. Where should I keep my medicine? This drug is given in a hospital or clinic and will not be stored at home. NOTE: This sheet is a summary. It may not cover all possible information. If you have questions about this medicine, talk to your doctor, pharmacist, or health care provider.  2012, [employer]. (02/02/2008 9:48:25 PM)

## 2011-09-04 ENCOUNTER — Other Ambulatory Visit (HOSPITAL_COMMUNITY): Payer: Self-pay | Admitting: *Deleted

## 2011-09-09 ENCOUNTER — Encounter (HOSPITAL_COMMUNITY): Payer: Self-pay

## 2011-09-09 ENCOUNTER — Encounter (HOSPITAL_COMMUNITY)
Admission: RE | Admit: 2011-09-09 | Discharge: 2011-09-09 | Disposition: A | Discharge status: Home / Self Care | Payer: Federal, State, Local not specified - PPO | Source: Ambulatory Visit | Attending: Obstetrics and Gynecology | Admitting: Obstetrics and Gynecology

## 2011-09-09 MED ORDER — SODIUM CHLORIDE 0.9 % IV SOLN
INTRAVENOUS | Status: DC
  Administered 2011-09-09: 14:00:00 via INTRAVENOUS

## 2011-09-09 MED ORDER — FERUMOXYTOL INJECTION 510 MG/17 ML
INTRAVENOUS | Status: AC
  Administered 2011-09-09: 510 mg via INTRAVENOUS
  Filled 2011-09-09: qty 17

## 2011-09-09 MED ORDER — FERUMOXYTOL INJECTION 510 MG/17 ML
510.0000 mg | INTRAVENOUS | Status: AC
  Administered 2011-09-09: 510 mg via INTRAVENOUS

## 2011-09-09 NOTE — Discharge Instructions (Signed)
Contact your D for any questions or concerns

## 2011-09-23 ENCOUNTER — Encounter (HOSPITAL_COMMUNITY): Payer: Self-pay | Admitting: Pharmacist

## 2011-09-24 ENCOUNTER — Other Ambulatory Visit: Payer: Self-pay | Admitting: Obstetrics and Gynecology

## 2011-09-29 ENCOUNTER — Encounter (HOSPITAL_COMMUNITY)
Admission: RE | Admit: 2011-09-29 | Discharge: 2011-09-29 | Disposition: A | Discharge status: Home / Self Care | Payer: Federal, State, Local not specified - PPO | Source: Ambulatory Visit | Attending: Obstetrics and Gynecology | Admitting: Obstetrics and Gynecology

## 2011-09-29 ENCOUNTER — Encounter (HOSPITAL_COMMUNITY): Payer: Self-pay

## 2011-09-29 LAB — SURGICAL PCR SCREEN: MRSA, PCR: NEGATIVE

## 2011-09-29 LAB — COMPREHENSIVE METABOLIC PANEL
AST: 18 U/L (ref 0–37)
Albumin: 4.1 g/dL (ref 3.5–5.2)
Alkaline Phosphatase: 41 U/L (ref 39–117)
BUN: 7 mg/dL (ref 6–23)
Chloride: 103 mEq/L (ref 96–112)
Potassium: 3.3 mEq/L — ABNORMAL LOW (ref 3.5–5.1)
Total Bilirubin: 0.2 mg/dL — ABNORMAL LOW (ref 0.3–1.2)

## 2011-09-29 LAB — CBC
HCT: 40.1 % (ref 36.0–46.0)
RDW: 21.9 % — ABNORMAL HIGH (ref 11.5–15.5)
WBC: 5 10*3/uL (ref 4.0–10.5)

## 2011-09-29 NOTE — Patient Instructions (Addendum)
20 Elisavet Buehrer  09/29/2011   Your procedure is scheduled on:  5/13  Enter through the Main Entrance of [facility] at 1130 AM.  Pick up the phone at the desk and dial [phone].   Call this number if you have problems the morning of surgery: [phone]   Remember:   Do not eat food:After Midnight.  Do not drink clear liquids: after 7AM  Take these medicines the morning of surgery with A SIP OF WATER: NA   Do not wear jewelry, make-up or nail polish.  Do not wear lotions, powders, or perfumes. You may wear deodorant.  Do not shave 48 hours prior to surgery.  Do not bring valuables to the hospital.  Contacts, dentures or bridgework may not be worn into surgery.  Leave suitcase in the car. After surgery it may be brought to your room.  For patients admitted to the hospital, checkout time is 11:00 AM the day of discharge.   Patients discharged the day of surgery will not be allowed to drive home.  Name and phone number of your driver: NA  Special Instructions: CHG Shower Use Special Wash: 1/2 bottle night before surgery and 1/2 bottle morning of surgery.   Please read over the following fact sheets that you were given: MRSA Information

## 2011-10-06 ENCOUNTER — Encounter (HOSPITAL_COMMUNITY): Payer: Self-pay | Admitting: *Deleted

## 2011-10-06 ENCOUNTER — Ambulatory Visit (HOSPITAL_COMMUNITY): Payer: Federal, State, Local not specified - PPO | Admitting: Anesthesiology

## 2011-10-06 ENCOUNTER — Ambulatory Visit (HOSPITAL_COMMUNITY)
Admission: RE | Admit: 2011-10-06 | Discharge: 2011-10-07 | Disposition: A | Discharge status: Home / Self Care | Payer: Federal, State, Local not specified - PPO | Source: Ambulatory Visit | Attending: Obstetrics and Gynecology | Admitting: Obstetrics and Gynecology

## 2011-10-06 ENCOUNTER — Encounter (HOSPITAL_COMMUNITY): Payer: Self-pay | Admitting: Anesthesiology

## 2011-10-06 ENCOUNTER — Encounter (HOSPITAL_COMMUNITY)
Admission: RE | Disposition: A | Discharge status: Home / Self Care | Payer: Self-pay | Source: Ambulatory Visit | Attending: Obstetrics and Gynecology

## 2011-10-06 DIAGNOSIS — N946 Dysmenorrhea, unspecified: Secondary | ICD-10-CM | POA: Insufficient documentation

## 2011-10-06 DIAGNOSIS — N83209 Unspecified ovarian cyst, unspecified side: Secondary | ICD-10-CM | POA: Insufficient documentation

## 2011-10-06 DIAGNOSIS — N949 Unspecified condition associated with female genital organs and menstrual cycle: Secondary | ICD-10-CM | POA: Insufficient documentation

## 2011-10-06 DIAGNOSIS — D500 Iron deficiency anemia secondary to blood loss (chronic): Secondary | ICD-10-CM | POA: Insufficient documentation

## 2011-10-06 DIAGNOSIS — N920 Excessive and frequent menstruation with regular cycle: Secondary | ICD-10-CM | POA: Insufficient documentation

## 2011-10-06 HISTORY — DX: Pelvic and perineal pain: R10.2

## 2011-10-06 HISTORY — DX: Excessive and frequent menstruation with regular cycle: N92.0

## 2011-10-06 LAB — PREPARE RBC (CROSSMATCH)

## 2011-10-06 LAB — ABO/RH: ABO/RH(D): A POS

## 2011-10-06 SURGERY — ROBOTIC ASSISTED TOTAL HYSTERECTOMY
Anesthesia: General | Site: Abdomen | Wound class: Clean Contaminated

## 2011-10-06 MED ORDER — MIDAZOLAM HCL 2 MG/2ML IJ SOLN
INTRAMUSCULAR | Status: AC
  Filled 2011-10-06: qty 2

## 2011-10-06 MED ORDER — ZOLPIDEM TARTRATE 5 MG PO TABS: 5.0000 mg | ORAL_TABLET | Freq: Every evening | ORAL | Status: DC | PRN

## 2011-10-06 MED ORDER — DEXAMETHASONE SODIUM PHOSPHATE 10 MG/ML IJ SOLN
INTRAMUSCULAR | Status: AC
  Filled 2011-10-06: qty 1

## 2011-10-06 MED ORDER — DEXTROSE IN LACTATED RINGERS 5 % IV SOLN
INTRAVENOUS | Status: DC
  Administered 2011-10-07: via INTRAVENOUS

## 2011-10-06 MED ORDER — MORPHINE SULFATE 10 MG/ML IJ SOLN
INTRAMUSCULAR | Status: AC
  Filled 2011-10-06: qty 1

## 2011-10-06 MED ORDER — NEOSTIGMINE METHYLSULFATE 1 MG/ML IJ SOLN
INTRAMUSCULAR | Status: DC | PRN
  Administered 2011-10-06: 3 mg via INTRAVENOUS

## 2011-10-06 MED ORDER — SUFENTANIL CITRATE 50 MCG/ML IV SOLN
INTRAVENOUS | Status: AC
  Filled 2011-10-06: qty 1

## 2011-10-06 MED ORDER — ARTIFICIAL TEARS OP OINT
TOPICAL_OINTMENT | OPHTHALMIC | Status: DC | PRN
  Administered 2011-10-06: 1 via OPHTHALMIC

## 2011-10-06 MED ORDER — PROPOFOL 10 MG/ML IV EMUL
INTRAVENOUS | Status: DC | PRN
  Administered 2011-10-06: 100 mg via INTRAVENOUS
  Administered 2011-10-06: 50 mg via INTRAVENOUS

## 2011-10-06 MED ORDER — ROCURONIUM BROMIDE 50 MG/5ML IV SOLN
INTRAVENOUS | Status: AC
  Filled 2011-10-06: qty 1

## 2011-10-06 MED ORDER — ARTIFICIAL TEARS OP OINT
TOPICAL_OINTMENT | OPHTHALMIC | Status: AC
  Filled 2011-10-06: qty 3.5

## 2011-10-06 MED ORDER — LACTATED RINGERS IV SOLN
INTRAVENOUS | Status: DC
  Administered 2011-10-06: 13:00:00 via INTRAVENOUS
  Administered 2011-10-06: 125 mL/h via INTRAVENOUS

## 2011-10-06 MED ORDER — PROMETHAZINE HCL 25 MG/ML IJ SOLN: 6.2500 mg | INTRAMUSCULAR | Status: DC | PRN

## 2011-10-06 MED ORDER — MIDAZOLAM HCL 2 MG/2ML IJ SOLN: 0.5000 mg | Freq: Once | INTRAMUSCULAR | Status: DC | PRN

## 2011-10-06 MED ORDER — VENLAFAXINE HCL ER 75 MG PO CP24
75.0000 mg | ORAL_CAPSULE | Freq: Every day | ORAL | Status: DC
  Filled 2011-10-06: qty 1

## 2011-10-06 MED ORDER — OXYCODONE-ACETAMINOPHEN 5-325 MG PO TABS
1.0000 | ORAL_TABLET | ORAL | Status: DC | PRN
Start: 2011-10-06 — End: 2011-10-07
  Administered 2011-10-07: 1 via ORAL
  Filled 2011-10-06: qty 1

## 2011-10-06 MED ORDER — KETOROLAC TROMETHAMINE 30 MG/ML IJ SOLN
15.0000 mg | Freq: Once | INTRAMUSCULAR | Status: AC | PRN
  Administered 2011-10-06: 30 mg via INTRAVENOUS

## 2011-10-06 MED ORDER — ACETAMINOPHEN 325 MG PO TABS: 325.0000 mg | ORAL_TABLET | ORAL | Status: DC | PRN

## 2011-10-06 MED ORDER — KETOROLAC TROMETHAMINE 30 MG/ML IJ SOLN
INTRAMUSCULAR | Status: AC
  Filled 2011-10-06: qty 1

## 2011-10-06 MED ORDER — FENTANYL CITRATE 0.05 MG/ML IJ SOLN
INTRAMUSCULAR | Status: AC
  Administered 2011-10-06: 50 ug via INTRAVENOUS
  Filled 2011-10-06: qty 2

## 2011-10-06 MED ORDER — FENTANYL CITRATE 0.05 MG/ML IJ SOLN
25.0000 ug | INTRAMUSCULAR | Status: DC | PRN
  Administered 2011-10-06: 50 ug via INTRAVENOUS

## 2011-10-06 MED ORDER — DEXAMETHASONE SODIUM PHOSPHATE 10 MG/ML IJ SOLN
INTRAMUSCULAR | Status: DC | PRN
  Administered 2011-10-06: 10 mg via INTRAVENOUS

## 2011-10-06 MED ORDER — SUFENTANIL CITRATE 50 MCG/ML IV SOLN
INTRAVENOUS | Status: DC | PRN
  Administered 2011-10-06: 20 ug via INTRAVENOUS
  Administered 2011-10-06 (×3): 10 ug via INTRAVENOUS

## 2011-10-06 MED ORDER — ONDANSETRON HCL 4 MG/2ML IJ SOLN
INTRAMUSCULAR | Status: AC
  Filled 2011-10-06: qty 2

## 2011-10-06 MED ORDER — MORPHINE SULFATE 10 MG/ML IJ SOLN
INTRAMUSCULAR | Status: DC | PRN
  Administered 2011-10-06: 10 mg via INTRAVENOUS

## 2011-10-06 MED ORDER — NEOSTIGMINE METHYLSULFATE 1 MG/ML IJ SOLN
INTRAMUSCULAR | Status: AC
  Filled 2011-10-06: qty 10

## 2011-10-06 MED ORDER — MEPERIDINE HCL 25 MG/ML IJ SOLN: 6.2500 mg | INTRAMUSCULAR | Status: DC | PRN

## 2011-10-06 MED ORDER — ONDANSETRON HCL 4 MG/2ML IJ SOLN
INTRAMUSCULAR | Status: DC | PRN
  Administered 2011-10-06: 4 mg via INTRAVENOUS

## 2011-10-06 MED ORDER — BUPIVACAINE HCL (PF) 0.25 % IJ SOLN
INTRAMUSCULAR | Status: AC
  Filled 2011-10-06: qty 30

## 2011-10-06 MED ORDER — CEFAZOLIN SODIUM 1-5 GM-% IV SOLN
INTRAVENOUS | Status: AC
  Filled 2011-10-06: qty 50

## 2011-10-06 MED ORDER — MIDAZOLAM HCL 5 MG/5ML IJ SOLN
INTRAMUSCULAR | Status: DC | PRN
  Administered 2011-10-06: 2 mg via INTRAVENOUS

## 2011-10-06 MED ORDER — MICROFIBRILLAR COLL HEMOSTAT EX PADS
MEDICATED_PAD | CUTANEOUS | Status: DC | PRN
  Administered 2011-10-06: 1 via TOPICAL

## 2011-10-06 MED ORDER — LIDOCAINE HCL (CARDIAC) 20 MG/ML IV SOLN
INTRAVENOUS | Status: DC | PRN
  Administered 2011-10-06: 40 mg via INTRAVENOUS

## 2011-10-06 MED ORDER — GLYCOPYRROLATE 0.2 MG/ML IJ SOLN
INTRAMUSCULAR | Status: AC
  Filled 2011-10-06: qty 2

## 2011-10-06 MED ORDER — ROCURONIUM BROMIDE 100 MG/10ML IV SOLN
INTRAVENOUS | Status: DC | PRN
  Administered 2011-10-06 (×2): 10 mg via INTRAVENOUS
  Administered 2011-10-06: 50 mg via INTRAVENOUS

## 2011-10-06 MED ORDER — GLYCOPYRROLATE 0.2 MG/ML IJ SOLN
INTRAMUSCULAR | Status: DC | PRN
  Administered 2011-10-06: 0.1 mg via INTRAVENOUS
  Administered 2011-10-06: .4 mg via INTRAVENOUS

## 2011-10-06 MED ORDER — BUPIVACAINE HCL (PF) 0.25 % IJ SOLN
INTRAMUSCULAR | Status: DC | PRN
  Administered 2011-10-06: 17 mL

## 2011-10-06 MED ORDER — TRAMADOL HCL 50 MG PO TABS: 50.0000 mg | ORAL_TABLET | Freq: Four times a day (QID) | ORAL | Status: DC | PRN

## 2011-10-06 MED ORDER — LACTATED RINGERS IR SOLN
Status: DC | PRN
  Administered 2011-10-06: 3000 mL

## 2011-10-06 MED ORDER — CEFAZOLIN SODIUM 1-5 GM-% IV SOLN
1.0000 g | INTRAVENOUS | Status: AC
  Administered 2011-10-06: 1 g via INTRAVENOUS

## 2011-10-06 MED ORDER — PROPOFOL 10 MG/ML IV EMUL
INTRAVENOUS | Status: AC
  Filled 2011-10-06: qty 40

## 2011-10-06 MED ORDER — LIDOCAINE HCL (CARDIAC) 20 MG/ML IV SOLN
INTRAVENOUS | Status: AC
  Filled 2011-10-06: qty 5

## 2011-10-06 SURGICAL SUPPLY — 72 items
BAG URINE DRAINAGE (UROLOGICAL SUPPLIES) ×4 IMPLANT
BARRIER ADHS 3X4 INTERCEED (GAUZE/BANDAGES/DRESSINGS) IMPLANT
BLADE LAP MORCELLATOR 15X9.5 (ELECTROSURGICAL) ×4 IMPLANT
CABLE HIGH FREQUENCY MONO STRZ (ELECTRODE) ×4 IMPLANT
CATH FOLEY 3WAY  5CC 16FR (CATHETERS) ×1
CATH FOLEY 3WAY 5CC 16FR (CATHETERS) ×3 IMPLANT
CHLORAPREP W/TINT 26ML (MISCELLANEOUS) ×4 IMPLANT
CLOTH BEACON ORANGE TIMEOUT ST (SAFETY) ×4 IMPLANT
CONT PATH 16OZ SNAP LID 3702 (MISCELLANEOUS) ×4 IMPLANT
COVER MAYO STAND STRL (DRAPES) ×4 IMPLANT
COVER TABLE BACK 60X90 (DRAPES) ×8 IMPLANT
COVER TIP SHEARS 8 DVNC (MISCELLANEOUS) ×3 IMPLANT
COVER TIP SHEARS 8MM DA VINCI (MISCELLANEOUS) ×1
DECANTER SPIKE VIAL GLASS SM (MISCELLANEOUS) ×4 IMPLANT
DERMABOND ADVANCED (GAUZE/BANDAGES/DRESSINGS) ×1
DERMABOND ADVANCED .7 DNX12 (GAUZE/BANDAGES/DRESSINGS) ×3 IMPLANT
DRAPE HUG U DISPOSABLE (DRAPE) ×4 IMPLANT
DRAPE LG THREE QUARTER DISP (DRAPES) ×8 IMPLANT
DRAPE MONITOR DA VINCI (DRAPE) IMPLANT
DRAPE WARM FLUID 44X44 (DRAPE) ×4 IMPLANT
ELECT REM PT RETURN 9FT ADLT (ELECTROSURGICAL) ×4
ELECTRODE REM PT RTRN 9FT ADLT (ELECTROSURGICAL) ×3 IMPLANT
EVACUATOR SMOKE 8.L (FILTER) ×4 IMPLANT
GAUZE VASELINE 3X9 (GAUZE/BANDAGES/DRESSINGS) IMPLANT
GLOVE BIO SURGEON STRL SZ7.5 (GLOVE) ×12 IMPLANT
GOWN STRL REIN XL XLG (GOWN DISPOSABLE) ×24 IMPLANT
GYRUS RUMI II 2.5CM BLUE (DISPOSABLE)
GYRUS RUMI II 3.5CM BLUE (DISPOSABLE)
GYRUS RUMI II 4.0CM BLUE (DISPOSABLE)
HEMOSTAT SURGICEL 2X14 (HEMOSTASIS) ×4 IMPLANT
KIT ACCESSORY DA VINCI DISP (KITS) ×1
KIT ACCESSORY DVNC DISP (KITS) ×3 IMPLANT
KIT DISP ACCESSORY 4 ARM (KITS) IMPLANT
NEEDLE INSUFFLATION 14GA 120MM (NEEDLE) ×4 IMPLANT
OCCLUDER COLPOPNEUMO (BALLOONS) ×8 IMPLANT
PACK LAVH (CUSTOM PROCEDURE TRAY) ×4 IMPLANT
PAD PREP 24X48 CUFFED NSTRL (MISCELLANEOUS) ×8 IMPLANT
PLUG CATH AND CAP STER (CATHETERS) ×4 IMPLANT
PROTECTOR NERVE ULNAR (MISCELLANEOUS) ×8 IMPLANT
RUMI II 3.0CM BLUE KOH-EFFICIE (DISPOSABLE) ×4 IMPLANT
RUMI II GYRUS 2.5CM BLUE (DISPOSABLE) IMPLANT
RUMI II GYRUS 3.5CM BLUE (DISPOSABLE) IMPLANT
RUMI II GYRUS 4.0CM BLUE (DISPOSABLE) IMPLANT
SET CYSTO W/LG BORE CLAMP LF (SET/KITS/TRAYS/PACK) ×4 IMPLANT
SET IRRIG TUBING LAPAROSCOPIC (IRRIGATION / IRRIGATOR) ×4 IMPLANT
SOLUTION ELECTROLUBE (MISCELLANEOUS) ×4 IMPLANT
SPONGE LAP 18X18 X RAY DECT (DISPOSABLE) ×4 IMPLANT
SUT VIC AB 0 CT1 27 (SUTURE) ×4
SUT VIC AB 0 CT1 27XBRD ANBCTR (SUTURE) ×6 IMPLANT
SUT VIC AB 0 CT1 27XBRD ANTBC (SUTURE) ×6 IMPLANT
SUT VICRYL 0 UR6 27IN ABS (SUTURE) ×8 IMPLANT
SUT VICRYL RAPIDE 4/0 PS 2 (SUTURE) ×8 IMPLANT
SUT VLOC 180 0 9IN  GS21 (SUTURE) ×1
SUT VLOC 180 0 9IN GS21 (SUTURE) ×3 IMPLANT
SYR 50ML LL SCALE MARK (SYRINGE) ×12 IMPLANT
SYRINGE 10CC LL (SYRINGE) ×4 IMPLANT
SYSTEM CONVERTIBLE TROCAR (TROCAR) IMPLANT
TIP RUMI ORANGE 6.7MMX12CM (TIP) ×4 IMPLANT
TIP UTERINE 5.1X6CM LAV DISP (MISCELLANEOUS) IMPLANT
TIP UTERINE 6.7X10CM GRN DISP (MISCELLANEOUS) IMPLANT
TIP UTERINE 6.7X6CM WHT DISP (MISCELLANEOUS) IMPLANT
TIP UTERINE 6.7X8CM BLUE DISP (MISCELLANEOUS) IMPLANT
TOWEL OR 17X24 6PK STRL BLUE (TOWEL DISPOSABLE) ×12 IMPLANT
TROCAR DISP BLADELESS 8 DVNC (TROCAR) ×3 IMPLANT
TROCAR DISP BLADELESS 8MM (TROCAR) ×1
TROCAR XCEL 12X100 BLDLESS (ENDOMECHANICALS) IMPLANT
TROCAR XCEL NON-BLD 5MMX100MML (ENDOMECHANICALS) ×4 IMPLANT
TROCAR Z-THREAD 12X150 (TROCAR) ×4 IMPLANT
TROCAR Z-THREAD FIOS 12X100MM (TROCAR) IMPLANT
TUBING FILTER THERMOFLATOR (ELECTROSURGICAL) ×4 IMPLANT
WARMER LAPAROSCOPE (MISCELLANEOUS) ×4 IMPLANT
WATER STERILE IRR 1000ML POUR (IV SOLUTION) ×12 IMPLANT

## 2011-10-06 NOTE — Op Note (Signed)
10/06/2011  4:41 PM  PATIENT:  [name]  41 y.o. female  PRE-OPERATIVE DIAGNOSIS:  Menorrhagia, Pelvic Pain  POST-OPERATIVE DIAGNOSIS:  Menorrhagia, pelvic pain  PROCEDURE:  Procedure(s): ROBOTIC ASSISTED TOTAL HYSTERECTOMY BILATERAL SALPINGECTOMY ROBOTIC ASSISTED LAPAROSCOPIC LYSIS OF ADHESION RIGHT OOPHERECTOMY CUL DE PLASTY UTERINE MORCELLATION  SURGEON:  Surgeon(s): Lenoard Aden, MD Alphonsus Sias. Ernestina Penna, MD  ASSISTANTSErnestina Penna, MD   ANESTHESIA:   local and general  ESTIMATED BLOOD LOSS: * No blood loss amount entered *   DRAINS: Urinary Catheter (Foley)   LOCAL MEDICATIONS USED:  MARCAINE     SPECIMEN:  Source of Specimen:  UTERUS , CERVIX, BILATERAL TUBES , RIGHT OVARY  DISPOSITION OF SPECIMEN:  PATHOLOGY  COUNTS:  YES  DICTATION # [id number]  PLAN OF CARE: DC IN AM  PATIENT DISPOSITION:  PACU - hemodynamically stable.

## 2011-10-06 NOTE — Transfer of Care (Signed)
Immediate Anesthesia Transfer of Care Note  Patient: [name]  Procedure(s) Performed: Procedure(s) (LRB): ROBOTIC ASSISTED TOTAL HYSTERECTOMY (N/A) BILATERAL SALPINGECTOMY (Bilateral) ROBOTIC ASSISTED LAPAROSCOPIC LYSIS OF ADHESION (N/A)  Patient Location: PACU  Anesthesia Type: General  Level of Consciousness: awake  Airway & Oxygen Therapy: Patient Spontanous Breathing and Patient connected to nasal cannula oxygen  Post-op Assessment: Report given to PACU RN, Post -op Vital signs reviewed and stable and Patient moving all extremities X 4  Post vital signs: Reviewed and stable  Complications: No apparent anesthesia complications

## 2011-10-06 NOTE — H&P (Signed)
NAMESTEFAN, [name]                ACCOUNT NO.:  [id number]  MEDICAL RECORD NO.:  [id number]  LOCATION:                                 FACILITY:  PHYSICIAN:  Lenoard Aden, M.D.DATE OF BIRTH:  01/04/1971  DATE OF ADMISSION:  10/06/2011 DATE OF DISCHARGE:                             HISTORY & PHYSICAL   CHIEF COMPLAINT:  Dysmenorrhea and menorrhagia with secondary anemia. Hemoglobin of 8.8 prior to iron therapy.  HISTORY OF PRESENT ILLNESS:  She is a 41 year old white female, G1, P1 with a history of dysmenorrhea, menorrhagia, and fibroids for definitive therapy.  ALLERGIES:  She is allergic to __________  MEDICATIONS:  __________, iron supplements __________  __________ one vaginal delivery, history of removal of thyroid goiter.  FAMILY HISTORY:  Noncontributory.  PHYSICAL EXAMINATION:  GENERAL:  Well-developed, well-nourished female with a height of 58-1/2 inches and weight of 140 pounds. HEENT:  Normal NECK:  Supple.  Full range of motion. LUNGS:  Clear. HEART:  Regular rhythm. ABDOMEN:  Soft, nontender. PELVIC:  An irregularly shaped 10-12 week size uterus.  No adnexal masses. EXTREMITIES:  No cords. NEURO:  Nonfocal. SKIN:  Intact.  IMPRESSION: 1. __________ dysmenorrhea, menorrhagia, secondary anemia __________     therapy. 2. Bilateral simple ovarian cyst.  PLAN:  Da Vinci-assisted total laparoscopic hysterectomy, bilateral salpingectomy, possible ovarian cystectomy.  Risks of anesthesia, infection, bleeding, injury to abdominal organs, need for repair were discussed.  Delayed versus immediate complications to include bowel and bladder injury noted.  The patient acknowledges and wishes to proceed.     Lenoard Aden, M.D.     RJT/MEDQ  D:  10/05/2011  T:  10/05/2011  Job:  [id number]

## 2011-10-06 NOTE — Anesthesia Postprocedure Evaluation (Signed)
Anesthesia Post Note  Patient: [name]  Procedure(s) Performed: Procedure(s) (LRB): ROBOTIC ASSISTED TOTAL HYSTERECTOMY (N/A) BILATERAL SALPINGECTOMY (Bilateral) ROBOTIC ASSISTED LAPAROSCOPIC LYSIS OF ADHESION (N/A)  Anesthesia type: General  Patient location: PACU  Post pain: Pain level controlled  Post assessment: Post-op Vital signs reviewed  Last Vitals:  Filed Vitals:   10/06/11 1745  BP:   Pulse:   Temp: 37.1 C  Resp:     Post vital signs: Reviewed  Level of consciousness: sedated  Complications: No apparent anesthesia complications

## 2011-10-06 NOTE — Anesthesia Preprocedure Evaluation (Addendum)
Anesthesia Evaluation  Patient identified by MRN, date of birth, ID band Patient awake    Reviewed: Allergy & Precautions, H&P , Patient's Chart, lab work & pertinent test results, reviewed documented beta blocker date and time   History of Anesthesia Complications Negative for: history of anesthetic complications  Airway Mallampati: III TM Distance: >3 FB Neck ROM: full  Mouth opening: Limited Mouth Opening  Dental No notable dental hx.    Pulmonary neg pulmonary ROS,  breath sounds clear to auscultation  Pulmonary exam normal       Cardiovascular Exercise Tolerance: Good negative cardio ROS  + Valvular Problems/Murmurs Rhythm:regular Rate:Normal     Neuro/Psych PSYCHIATRIC DISORDERS negative neurological ROS  negative psych ROS   GI/Hepatic negative GI ROS, Neg liver ROS,   Endo/Other  negative endocrine ROS  Renal/GU negative Renal ROS     Musculoskeletal   Abdominal   Peds  Hematology negative hematology ROS (+)   Anesthesia Other Findings Anxiety     Anemia   due to heavy periods    Hypocholesteremia   medical treatment Heart murmur   aschild antibiotics if having Dental work    Pelvic pain     Menorrhagia    Reproductive/Obstetrics negative OB ROS                          Anesthesia Physical Anesthesia Plan  ASA: II  Anesthesia Plan: General ETT   Post-op Pain Management:    Induction:   Airway Management Planned: Video Laryngoscope Planned  Additional Equipment:   Intra-op Plan:   Post-operative Plan:   Informed Consent: I have reviewed the patients History and Physical, chart, labs and discussed the procedure including the risks, benefits and alternatives for the proposed anesthesia with the patient or authorized representative who has indicated his/her understanding and acceptance.   Dental Advisory Given  Plan Discussed with: CRNA and Surgeon  Anesthesia Plan  Comments:        Anesthesia Quick Evaluation

## 2011-10-06 NOTE — Progress Notes (Signed)
Patient ID: [name], female   DOB: [birth year], 41 y.o.   MRN: [id number] Patient seen and examined. Consent witnessed and signed. No changes noted. Update completed.  H&P dictated.

## 2011-10-07 LAB — CBC
MCHC: 33.4 g/dL (ref 30.0–36.0)
Platelets: 223 10*3/uL (ref 150–400)
RDW: 21.1 % — ABNORMAL HIGH (ref 11.5–15.5)

## 2011-10-07 MED ORDER — TRAMADOL HCL 50 MG PO TABS: 50.0000 mg | ORAL_TABLET | Freq: Four times a day (QID) | ORAL | Status: AC | PRN

## 2011-10-07 MED ORDER — OXYCODONE-ACETAMINOPHEN 5-325 MG PO TABS: 1.0000 | ORAL_TABLET | ORAL | Status: AC | PRN

## 2011-10-07 NOTE — Progress Notes (Signed)
Pt   Ambulated  Out   Teaching complete  

## 2011-10-07 NOTE — Addendum Note (Signed)
Addendum  created 10/07/11 1610 by Graciela Husbands, CRNA   Modules edited:Notes Section

## 2011-10-07 NOTE — Anesthesia Postprocedure Evaluation (Signed)
  Anesthesia Post-op Note  Patient: [name]  Procedure(s) Performed: Procedure(s) (LRB): ROBOTIC ASSISTED TOTAL HYSTERECTOMY (N/A) BILATERAL SALPINGECTOMY (Bilateral) ROBOTIC ASSISTED LAPAROSCOPIC LYSIS OF ADHESION (N/A)  Patient Location: 317  Anesthesia Type: General  Level of Consciousness: awake, alert  and oriented  Airway and Oxygen Therapy: Patient Spontanous Breathing  Post-op Pain: mild  Post-op Assessment: Post-op Vital signs reviewed and Patient's Cardiovascular Status Stable  Post-op Vital Signs: Reviewed and stable  Complications: No apparent anesthesia complications

## 2011-10-07 NOTE — Op Note (Signed)
[name], [name]                ACCOUNT NO.:  [id number]  MEDICAL RECORD NO.:  [id number]  LOCATION:  9317                          FACILITY:  [facility]  PHYSICIAN:  Lenoard Aden, M.D.DATE OF BIRTH:  Oct 14, 1970  DATE OF PROCEDURE: DATE OF DISCHARGE:                              OPERATIVE REPORT   SURGEON:  Lenoard Aden, MD  DESCRIPTION OF PROCEDURE:  After being apprised of the risks of anesthesia, infection, bleeding, injury to abdominal organs, need for repair delayed versus immediate complications to include bowel and bladder injury, possible need for repair, the patient was brought to the operating room and was administered a general anesthetic without complications.  She was prepped and draped in usual sterile fashion. Feet were placed in Yellofin stirrups.  Exam under anesthesia revealed a 12-14-week size anteflexed and irregularly shaped uterus and no palpable adnexal masses.  RUMI retractor was placed in standard fashion vaginally after placement of suture and anterior lip of the cervix, which were secured through the RUMI retractor.  At this time, a supraumbilical incision made with a scalpel.  Veress needle was placed, opening pressure of -2.  A 3.5 liters of CO2 was insufflated without difficulty. Trocar was placed atraumatically.  Picture was taken.  Visualization reveals a multilobulated uterus consistent with large fibroid, normal left ovary, normal left tube, normal right tube, right ovary as enlarged and adhesed into the right cul-de-sac.  At this time, accessory ports were made, two on the right and two on the left.  Three robotic ports were placed and one 5-mm assistant port.  At this time, the robot was docked in a standard fashion.  ProGrasp, PK forceps and Endo Shears were placed.  At this time, the procedure was initiated on the left side where the retroperitoneal space was entered cephalad to the round ligament parallel to the infundibulum ligament and  the ureter was identified.  The space was then dissected.  The tubo-ovarian ligament was clamped and cut.  The right tube was separated.  The round ligament was divided.  The bladder flap was developed sharply.  The retroperitoneal space was entered.  The ureter was identified and pushed down off the medial leaf of the peritoneum.  The left uterine vessels were skeletonized, cauterized, but not cut.  The uterus was then pushed to the patient's left.  The tube was detached along the mesosalpinx. Retroperitoneal space was entered in standard fashion and due to the multiple adhesions and enlargement of this ovary, decision was made to remove the ovary.  Thereby, the ureter was identified.  The IP ligament was isolated to the retroperitoneal space, cauterized and cut.  Further development of retroperitoneal space allowed [place] dissection of the right ovary off the ovarian fossa on noting the ureter during the entire process.  At this time, the right round ligament was cauterized, cut, and divided.  The bladder flap was developed.  Uterine vessels were skeletonized.  At this time, the uterine vessels on the uterus were cauterized and cut.  The balloon was inflated and the specimen was then detached at the cervical vaginal junction, detached circumferentially using the Endo Shears.  Due to the enlargement of the specimen,  the specimen was retracted, the RUMI retractor was placed.  The vagina was then closed using a 0 V-Loc suture in a continuous running fashion. Culdoplasty suture also having been placed.  Good hemostasis was achieved.  Irrigation was accomplished and the vagina was completely closed.  At this time, the morcellated was entered and the uterus, tubes, and right ovary were removed through the morcellator in a standard fashion.  Good hemostasis was noted.  All pieces were removed. The instruments were then all removed under direct visualization.  CO2 was released.  Incision was closed  using 0 Vicryl, 4-0 Vicryl, and Dermabond.  Everything was removed from the vagina.  Urine was clear.  Please note, the ureters were noted to be peristalsing bilaterally and normally at the end of the procedure.  The patient tolerated the procedure well, was awakened and transferred to recovery in good condition.     Lenoard Aden, M.D.     RJT/MEDQ  D:  10/06/2011  T:  10/07/2011  Job:  [id number]

## 2011-10-07 NOTE — Progress Notes (Signed)
1 Day Post-Op Procedure(s) (LRB): ROBOTIC ASSISTED TOTAL HYSTERECTOMY (N/A) BILATERAL SALPINGECTOMY (Bilateral) ROBOTIC ASSISTED LAPAROSCOPIC LYSIS OF ADHESION (N/A)  Subjective: Patient reports nausea, incisional pain, tolerating PO, + flatus and no problems voiding.     Component Value Date/Time  WBC 11.1* 10/07/2011 0535  RBC 4.44 10/07/2011 0535  HGB 12.4 10/07/2011 0535  HCT 37.1 10/07/2011 0535  PLT 223 10/07/2011 0535  MCV 83.6 10/07/2011 0535  MCH 27.9 10/07/2011 0535  MCHC 33.4 10/07/2011 0535  RDW 21.1* 10/07/2011 0535  LYMPHSABS 1.7 08/10/2011 2049  MONOABS 0.5 08/10/2011 2049  EOSABS 0.1 08/10/2011 2049  BASOSABS 0.0 08/10/2011 2049  Objective: BP 110/69  Pulse 86  Temp(Src) 98.4 F (36.9 C) (Oral)  Resp 18  Ht 5' (1.524 m)  Wt 63.504 kg (140 lb)  BMI 27.34 kg/m2  SpO2 95%  I have reviewed patient's vital signs, intake and output, medications and labs.  General: alert, cooperative, appears stated age, no distress and mildly obese Resp: clear to auscultation bilaterally and normal percussion bilaterally Cardio: regular rate and rhythm, S1, S2 normal, no murmur, click, rub or gallop and normal apical impulse GI: soft, non-tender; bowel sounds normal; no masses,  no organomegaly and incision: clean, dry and intact Extremities: extremities normal, atraumatic, no cyanosis or edema, Homans sign is negative, no sign of DVT and no edema, redness or tenderness in the calves or thighs Vaginal Bleeding: minimal  Assessment: s/p Procedure(s) (LRB): ROBOTIC ASSISTED TOTAL HYSTERECTOMY (N/A) BILATERAL SALPINGECTOMY (Bilateral) ROBOTIC ASSISTED LAPAROSCOPIC LYSIS OF ADHESION (N/A): stable, progressing well and tolerating diet  Plan: Advance diet Encourage ambulation Advance to PO medication Discontinue IV fluids Discharge home  LOS: 1 day    Britanny Marksberry J 10/07/2011, 7:25 AM

## 2011-10-09 LAB — TYPE AND SCREEN
Antibody Screen: NEGATIVE
Unit division: 0

## 2015-10-21 DIAGNOSIS — R1032 Left lower quadrant pain: Secondary | ICD-10-CM | POA: Diagnosis not present

## 2015-10-26 DIAGNOSIS — N83202 Unspecified ovarian cyst, left side: Secondary | ICD-10-CM | POA: Diagnosis not present

## 2015-10-26 DIAGNOSIS — K4020 Bilateral inguinal hernia, without obstruction or gangrene, not specified as recurrent: Secondary | ICD-10-CM | POA: Diagnosis not present

## 2015-11-01 DIAGNOSIS — N83209 Unspecified ovarian cyst, unspecified side: Secondary | ICD-10-CM | POA: Diagnosis not present

## 2015-11-13 ENCOUNTER — Ambulatory Visit: Payer: Self-pay | Admitting: Surgery

## 2015-11-13 DIAGNOSIS — Z01818 Encounter for other preprocedural examination: Secondary | ICD-10-CM | POA: Diagnosis not present

## 2015-11-13 DIAGNOSIS — K5909 Other constipation: Secondary | ICD-10-CM | POA: Diagnosis not present

## 2015-11-13 DIAGNOSIS — K4020 Bilateral inguinal hernia, without obstruction or gangrene, not specified as recurrent: Secondary | ICD-10-CM | POA: Diagnosis not present

## 2015-11-13 NOTE — H&P (Signed)
[name] 11/13/2015 9:49 AM Location: [facility] Patient #: [id number] DOB: [birth year] Married / Language: English / Race: White Female  Patient Care Team: Lona Kettle, MD as PCP - General (Family Medicine) Michael Boston, MD as Consulting Physician (General Surgery) Brien Few, MD as Consulting Physician (Obstetrics and Gynecology)   History of Present Illness Rachel Hector MD; 11/13/2015 1:26 PM) The patient is a 45 year old female who presents for an evaluation of a hernia. Note for "Hernia": Patient sent for surgical consultation by her gynecologist, Dr. Bryson Corona. Concern for left inguinal hernia.  Pleasant Hispanic woman. Originally from [place] and then [place]. Has been in [place] for many years now. Noticed left lower quadrant/groin pain intermittently over the past year. Now constant soreness. Discussed with her primary care physician. Concern perhaps an ovarian issue based on studies. Saw her gynecologist. She does have a 5 cm left ovarian cyst. She already has had a hysterectomy and right salpingo-oophorectomy. Dr. Ronita Hipps suspected more hernia issue based on a recent CT scan that showed small bilateral fat-containing inguinal hernias. Patient has a brother-in-law is a doctor that wondered if she had an incarcerated hernia full of small intestine given the fact that the patient has had some constipation issues. However it's not severe. 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 Can walk a half hour without difficulty. No history kidney stones. No problems with urination or urinary tract infections. She's never abdominal surgery. She did have an episode of severe headaches after a skiing accident and is seen her neurologist. She is on a headache medicine that she thinks is an antiseizure medicine. She's been warned against using Tylenol or other over-the-counter pain medications with that.   Other Problems Illene Regulus, CMA;  11/13/2015 9:49 AM) Heart murmur Migraine Headache  Past Surgical History Illene Regulus, CMA; 11/13/2015 9:49 AM) Thyroid Surgery  Diagnostic Studies History Lars Mage Spillers, CMA; 11/13/2015 9:49 AM) Mammogram >3 years ago  Allergies Lars Mage Spillers, CMA; 11/13/2015 9:50 AM) Bactrim *ANTI-INFECTIVE AGENTS - MISC.*    Review of Systems Lars Mage Spillers CMA; 11/13/2015 9:49 AM) Gastrointestinal Present- Abdominal Pain. Not Present- Bloating, Bloody Stool, Change in Bowel Habits, Chronic diarrhea, Constipation, Difficulty Swallowing, Excessive gas, Gets full quickly at meals, Hemorrhoids, Indigestion, Nausea, Rectal Pain and Vomiting.  Vitals (Alisha Spillers CMA; 11/13/2015 9:50 AM) 11/13/2015 9:49 AM Weight: 130 lb Height: 60in Body Surface Area: 1.55 m Body Mass Index: 25.39 kg/m  Pulse: 54 (Regular)  BP: 120/70 (Sitting, Left Arm, Standard)       Physical Exam Rachel Hector MD; 11/13/2015 10:24 AM) General Mental Status-Alert. General Appearance-Not in acute distress, Not Sickly. Orientation-Oriented X3. Hydration-Well hydrated. Voice-Normal.  Integumentary Global Assessment Upon inspection and palpation of skin surfaces of the - Axillae: non-tender, no inflammation or ulceration, no drainage. and Distribution of scalp and body hair is normal. General Characteristics Temperature - normal warmth is noted.  Head and Neck Head-normocephalic, atraumatic with no lesions or palpable masses. Face Global Assessment - atraumatic, no absence of expression. Neck Global Assessment - no abnormal movements, no bruit auscultated on the right, no bruit auscultated on the left, no decreased range of motion, non-tender. Trachea-midline. Thyroid Gland Characteristics - non-tender.  Eye Eyeball - Left-Extraocular movements intact, No Nystagmus. Eyeball - Right-Extraocular movements intact, No Nystagmus. Cornea - Left-No Hazy. Cornea -  Right-No Hazy. Sclera/Conjunctiva - Left-No scleral icterus, No Discharge. Sclera/Conjunctiva - Right-No scleral icterus, No Discharge. Pupil - Left-Direct reaction to light normal. Pupil -  Right-Direct reaction to light normal.  ENMT Ears Pinna - Left - no drainage observed, no generalized tenderness observed. Right - no drainage observed, no generalized tenderness observed. Nose and Sinuses External Inspection of the Nose - no destructive lesion observed. Inspection of the nares - Left - quiet respiration. Right - quiet respiration. Mouth and Throat Lips - Upper Lip - no fissures observed, no pallor noted. Lower Lip - no fissures observed, no pallor noted. Nasopharynx - no discharge present. Oral Cavity/Oropharynx - Tongue - no dryness observed. Oral Mucosa - no cyanosis observed. Hypopharynx - no evidence of airway distress observed.  Chest and Lung Exam Inspection Movements - Normal and Symmetrical. Accessory muscles - No use of accessory muscles in breathing. Palpation Palpation of the chest reveals - Non-tender. Auscultation Breath sounds - Normal and Clear.  Cardiovascular Auscultation Rhythm - Regular. Murmurs & Other Heart Sounds - Auscultation of the heart reveals - No Murmurs and No Systolic Clicks.  Abdomen Inspection Inspection of the abdomen reveals - No Visible peristalsis and No Abnormal pulsations. Umbilicus - No Bleeding, No Urine drainage. Palpation/Percussion Palpation and Percussion of the abdomen reveal - Soft, Non Tender, No Rebound tenderness, No Rigidity (guarding) and No Cutaneous hyperesthesia. Note: Abdomen soft. Nontender, nondistended. No guarding. No umbilical no other hernias   Female Genitourinary Sexual Maturity Tanner 5 - Adult hair pattern. Note: Discomfort in left groin. Less on the right. Subtle bulging with coughing left greater than right. No vaginal bleeding nor discharge   Peripheral Vascular Upper Extremity Inspection -  Left - No Cyanotic nailbeds, Not Ischemic. Right - No Cyanotic nailbeds, Not Ischemic.  Neurologic Neurologic evaluation reveals -normal attention span and ability to concentrate, able to name objects and repeat phrases. Appropriate fund of knowledge , normal sensation and normal coordination. Mental Status Affect - not angry, not paranoid. Cranial Nerves-Normal Bilaterally. Gait-Normal.  Neuropsychiatric Mental status exam performed with findings of-able to articulate well with normal speech/language, rate, volume and coherence, thought content normal with ability to perform basic computations and apply abstract reasoning and no evidence of hallucinations, delusions, obsessions or homicidal/suicidal ideation.  Musculoskeletal Global Assessment Spine, Ribs and Pelvis - no instability, subluxation or laxity. Right Upper Extremity - no instability, subluxation or laxity.  Lymphatic Head & Neck  General Head & Neck Lymphatics: Bilateral - Description - No Localized lymphadenopathy. Axillary  General Axillary Region: Bilateral - Description - No Localized lymphadenopathy. Femoral & Inguinal  Generalized Femoral & Inguinal Lymphatics: Left - Description - No Localized lymphadenopathy. Right - Description - No Localized lymphadenopathy.    Assessment & Plan Rachel Hector MD; 11/13/2015 10:27 AM) BILATERAL INGUINAL HERNIA WITHOUT OBSTRUCTION OR GANGRENE, RECURRENCE NOT SPECIFIED (K40.20) Impression: Bilateral inguinal hernias seen on CT scan. Left side rather symptomatic. They are not large. However I suspect given the fact that her pain is reproduced with activity and straining, it is most likely the etiology for her groin pain.  I think she would benefit from surgery to repair both areas since she has evidence on the contralateral side as well. Laparoscopic underlay repair mesh.  Should she require her left ovary removed for a persistent or enlarging cyst, can coordinate with  Dr. table in. Can try and do this robotically fats which Dr. Rutherford Limerick prefers. She is due to see him next month for a follow-up ultrasound and will let me know what the plan as. Her graft I think it is reasonable to control pain with ice and heat. Usually recommend her over-the-counter anti-inflammatories.  She recalls being told by her neurologist to avoid some of those medications do not conflict with her headache medicine. She cannot recall the name of the medicine now. She will touch weightbase with the neurologist. She feels reassured that it is not incarcerated with small bowel bowel. She has a lot of relatives that her physicians that have been giving her advice. While Dr. Hassell Done in our group did her thyroid surgery many years ago, she feels comfortable with me being her hernia surgeon should it come to that. PREOP - ING HERNIA - ENCOUNTER FOR PREOPERATIVE EXAMINATION FOR GENERAL SURGICAL PROCEDURE (Z01.818) Current Plans You are being scheduled for surgery - Our schedulers will call you.  You should hear from our office's scheduling department within 5 working days about the location, date, and time of surgery. We try to make accommodations for patient's preferences in scheduling surgery, but sometimes the OR schedule or the surgeon's schedule prevents [place] from making those accommodations.  If you have not heard from our office [phone]) in 5 working days, call the office and ask for your surgeon's nurse.  If you have other questions about your diagnosis, plan, or surgery, call the office and ask for your surgeon's nurse.  Written instructions provided The anatomy & physiology of the abdominal wall and pelvic floor was discussed. The pathophysiology of hernias in the inguinal and pelvic region was discussed. Natural history risks such as progressive enlargement, pain, incarceration, and strangulation was discussed. Contributors to complications such as smoking, obesity, diabetes, prior surgery,  etc were discussed.  I feel the risks of no intervention will lead to serious problems that outweigh the operative risks; therefore, I recommended surgery to reduce and repair the hernia. I explained laparoscopic techniques with possible need for an open approach. I noted usual use of mesh to patch and/or buttress hernia repair  Risks such as bleeding, infection, abscess, need for further treatment, heart attack, death, and other risks were discussed. I noted a good likelihood this will help address the problem. Goals of post-operative recovery were discussed as well. Possibility that this will not correct all symptoms was explained. I stressed the importance of low-impact activity, aggressive pain control, avoiding constipation, & not pushing through pain to minimize risk of post-operative chronic pain or injury. Possibility of reherniation was discussed. We will work to minimize complications.  An educational handout further explaining the pathology & treatment options was given as well. Questions were answered. The patient expresses understanding & wishes to proceed with surgery.  Pt Education - Pamphlet Given - Laparoscopic Hernia Repair: discussed with patient and provided information. Pt Education - [facility] Pain Control (Pecola Haxton) Pt Education - [facility] Hernia Post-Op HCI (Ryszard Socarras): discussed with patient and provided information. CHRONIC CONSTIPATION (K59.09) Impression: Mild constipation. She was worried that small bowel may be in the inguinal hernia as her brother-in-law Dr. suggested. I do not think that is a case. There are 2 small and just contained fat based on CT scan. She feels reassured.  I think she would benefit from a fiber bowel regimen to help overcome mild constipation. Consider colonoscopy if it does not improve. Only mild/moderate constipation. Not severe. Current Plans Pt Education - [facility] Constipation (AT) Pt Education - [facility] Good Bowel Health (Shelli Portilla)  [name], M.D.,  F.A.C.S. Gastrointestinal and Minimally Invasive Surgery [facility] [address], [place], [place] [address] [phone] Main / Paging

## 2015-12-06 DIAGNOSIS — N83292 Other ovarian cyst, left side: Secondary | ICD-10-CM | POA: Diagnosis not present

## 2015-12-06 DIAGNOSIS — R1032 Left lower quadrant pain: Secondary | ICD-10-CM | POA: Diagnosis not present

## 2015-12-06 DIAGNOSIS — G43719 Chronic migraine without aura, intractable, without status migrainosus: Secondary | ICD-10-CM | POA: Diagnosis not present

## 2015-12-18 ENCOUNTER — Ambulatory Visit: Payer: Self-pay | Admitting: Surgery

## 2015-12-18 NOTE — H&P (Signed)
[name]  Location: [facility] Surgery Patient #: [id number] DOB: 16-Aug-1970 Married / Language: English / Race: White Female   History of Present Illness  The patient is a 45 year old female who presents for an evaluation of a hernia. Note for "Hernia": Patient sent for surgical consultation by her gynecologist, Dr. Bryson Corona. Concern for left inguinal hernia.  Pleasant Hispanic woman. Originally from [place] and then [place]. Has been in [place] for many years now. Noticed left lower quadrant/groin pain intermittently over the past year. Now constant soreness. Discussed with her primary care physician. Concern perhaps an ovarian issue based on studies. Saw her gynecologist. She does have a 5 cm left ovarian cyst. She already has had a hysterectomy and right salpingo-oophorectomy. Dr. Ronita Hipps suspected more hernia issue based on a recent CT scan that showed small bilateral fat-containing inguinal hernias. Patient has a brother-in-law is a doctor that wondered if she had an incarcerated hernia full of small intestine given the fact that the patient has had some constipation issues. However it's not severe. 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 Can walk a half hour without difficulty. No history kidney stones. No problems with urination or urinary tract infections. She's never abdominal surgery. She did have an episode of severe headaches after a skiing accident and is seen her neurologist. She is on a headache medicine that she thinks is an antiseizure medicine. She's been warned against using Tylenol or other over-the-counter pain medications with that.   Other Problems Illene Regulus, CMA; 11/13/2015 9:49 AM) Heart murmur Migraine Headache  Past Surgical History Illene Regulus, CMA; 11/13/2015 9:49 AM) Thyroid Surgery  Diagnostic Studies History Lars Mage Spillers, CMA; 11/13/2015 9:49 AM) Mammogram >3 years ago  Allergies Lars Mage Spillers,  CMA; 11/13/2015 9:50 AM) Bactrim *ANTI-INFECTIVE AGENTS - MISC.*    Review of Systems Lars Mage Spillers CMA; 11/13/2015 9:49 AM) Gastrointestinal Present- Abdominal Pain. Not Present- Bloating, Bloody Stool, Change in Bowel Habits, Chronic diarrhea, Constipation, Difficulty Swallowing, Excessive gas, Gets full quickly at meals, Hemorrhoids, Indigestion, Nausea, Rectal Pain and Vomiting.  Vitals (Alisha Spillers CMA; 11/13/2015 9:50 AM) 11/13/2015 9:49 AM Weight: 130 lb Height: 60in Body Surface Area: 1.55 m Body Mass Index: 25.39 kg/m  Pulse: 54 (Regular)  BP: 120/70 (Sitting, Left Arm, Standard)       Physical Exam Adin Hector MD; 11/13/2015 10:24 AM) General Mental Status-Alert. General Appearance-Not in acute distress, Not Sickly. Orientation-Oriented X3. Hydration-Well hydrated. Voice-Normal.  Integumentary Global Assessment Upon inspection and palpation of skin surfaces of the - Axillae: non-tender, no inflammation or ulceration, no drainage. and Distribution of scalp and body hair is normal. General Characteristics Temperature - normal warmth is noted.  Head and Neck Head-normocephalic, atraumatic with no lesions or palpable masses. Face Global Assessment - atraumatic, no absence of expression. Neck Global Assessment - no abnormal movements, no bruit auscultated on the right, no bruit auscultated on the left, no decreased range of motion, non-tender. Trachea-midline. Thyroid Gland Characteristics - non-tender.  Eye Eyeball - Left-Extraocular movements intact, No Nystagmus. Eyeball - Right-Extraocular movements intact, No Nystagmus. Cornea - Left-No Hazy. Cornea - Right-No Hazy. Sclera/Conjunctiva - Left-No scleral icterus, No Discharge. Sclera/Conjunctiva - Right-No scleral icterus, No Discharge. Pupil - Left-Direct reaction to light normal. Pupil - Right-Direct reaction to light normal.  ENMT Ears Pinna - Left -  no drainage observed, no generalized tenderness observed. Right - no drainage observed, no generalized tenderness observed. Nose and Sinuses External Inspection of the Nose -  no destructive lesion observed. Inspection of the nares - Left - quiet respiration. Right - quiet respiration. Mouth and Throat Lips - Upper Lip - no fissures observed, no pallor noted. Lower Lip - no fissures observed, no pallor noted. Nasopharynx - no discharge present. Oral Cavity/Oropharynx - Tongue - no dryness observed. Oral Mucosa - no cyanosis observed. Hypopharynx - no evidence of airway distress observed.  Chest and Lung Exam Inspection Movements - Normal and Symmetrical. Accessory muscles - No use of accessory muscles in breathing. Palpation Palpation of the chest reveals - Non-tender. Auscultation Breath sounds - Normal and Clear.  Cardiovascular Auscultation Rhythm - Regular. Murmurs & Other Heart Sounds - Auscultation of the heart reveals - No Murmurs and No Systolic Clicks.  Abdomen Inspection Inspection of the abdomen reveals - No Visible peristalsis and No Abnormal pulsations. Umbilicus - No Bleeding, No Urine drainage. Palpation/Percussion Palpation and Percussion of the abdomen reveal - Soft, Non Tender, No Rebound tenderness, No Rigidity (guarding) and No Cutaneous hyperesthesia. Note: Abdomen soft. Nontender, nondistended. No guarding. No umbilical no other hernias   Female Genitourinary Sexual Maturity Tanner 5 - Adult hair pattern. Note: Discomfort in left groin. Less on the right. Subtle bulging with coughing left greater than right. No vaginal bleeding nor discharge   Peripheral Vascular Upper Extremity Inspection - Left - No Cyanotic nailbeds, Not Ischemic. Right - No Cyanotic nailbeds, Not Ischemic.  Neurologic Neurologic evaluation reveals -normal attention span and ability to concentrate, able to name objects and repeat phrases. Appropriate fund of knowledge , normal  sensation and normal coordination. Mental Status Affect - not angry, not paranoid. Cranial Nerves-Normal Bilaterally. Gait-Normal.  Neuropsychiatric Mental status exam performed with findings of-able to articulate well with normal speech/language, rate, volume and coherence, thought content normal with ability to perform basic computations and apply abstract reasoning and no evidence of hallucinations, delusions, obsessions or homicidal/suicidal ideation.  Musculoskeletal Global Assessment Spine, Ribs and Pelvis - no instability, subluxation or laxity. Right Upper Extremity - no instability, subluxation or laxity.  Lymphatic Head & Neck  General Head & Neck Lymphatics: Bilateral - Description - No Localized lymphadenopathy. Axillary  General Axillary Region: Bilateral - Description - No Localized lymphadenopathy. Femoral & Inguinal  Generalized Femoral & Inguinal Lymphatics: Left - Description - No Localized lymphadenopathy. Right - Description - No Localized lymphadenopathy.    Assessment & Plan ( BILATERAL INGUINAL HERNIA WITHOUT OBSTRUCTION OR GANGRENE, RECURRENCE NOT SPECIFIED (K40.20) Impression: Bilateral inguinal hernias seen on CT scan. Left side rather symptomatic. They are not large. However I suspect given the fact that her pain is reproduced with activity and straining, it is most likely the etiology for her groin pain.  I think she would benefit from surgery to repair both areas since she has evidence on the contralateral side as well. Laparoscopic underlay repair mesh.  Should she require her left ovary removed for a persistent or enlarging cyst, can coordinate with Dr. Hinton Lovely. Can try and do this robotically which Dr. Ronita Hipps prefers. She is due to see him next month for a follow-up ultrasound and will let me know what the plan as. Her graft I think it is reasonable to control pain with ice and heat. Usually recommend her over-the-counter anti-inflammatories. She  recalls being told by her neurologist to avoid some of those medications do not conflict with her headache medicine. She cannot recall the name of the medicine now. She will touch weightbase with the neurologist. She feels reassured that it is not incarcerated with  small bowel bowel. She has a lot of relatives that her physicians that have been giving her advice. While Dr. Hassell Done in our group did her thyroid surgery many years ago, she feels comfortable with me being her hernia surgeon should it come to that.    PREOP - ING HERNIA - ENCOUNTER FOR PREOPERATIVE EXAMINATION FOR GENERAL SURGICAL PROCEDURE (Z01.818) Current Plans You are being scheduled for surgery - Our schedulers will call you.  You should hear from our office's scheduling department within 5 working days about the location, date, and time of surgery. We try to make accommodations for patient's preferences in scheduling surgery, but sometimes the OR schedule or the surgeon's schedule prevents [place] from making those accommodations.  If you have not heard from our office [phone]) in 5 working days, call the office and ask for your surgeon's nurse.  If you have other questions about your diagnosis, plan, or surgery, call the office and ask for your surgeon's nurse.  Written instructions provided The anatomy & physiology of the abdominal wall and pelvic floor was discussed. The pathophysiology of hernias in the inguinal and pelvic region was discussed. Natural history risks such as progressive enlargement, pain, incarceration, and strangulation was discussed. Contributors to complications such as smoking, obesity, diabetes, prior surgery, etc were discussed.  I feel the risks of no intervention will lead to serious problems that outweigh the operative risks; therefore, I recommended surgery to reduce and repair the hernia. I explained laparoscopic techniques with possible need for an open approach. I noted usual use of mesh to patch  and/or buttress hernia repair  Risks such as bleeding, infection, abscess, need for further treatment, heart attack, death, and other risks were discussed. I noted a good likelihood this will help address the problem. Goals of post-operative recovery were discussed as well. Possibility that this will not correct all symptoms was explained. I stressed the importance of low-impact activity, aggressive pain control, avoiding constipation, & not pushing through pain to minimize risk of post-operative chronic pain or injury. Possibility of reherniation was discussed. We will work to minimize complications.  An educational handout further explaining the pathology & treatment options was given as well. Questions were answered. The patient expresses understanding & wishes to proceed with surgery.  Pt Education - Pamphlet Given - Laparoscopic Hernia Repair: discussed with patient and provided information. Pt Education - [facility] Pain Control (Evelin Cake) Pt Education - [facility] Hernia Post-Op HCI (Siobahn Worsley): discussed with patient and provided information.  CHRONIC CONSTIPATION (K59.09) Impression: Mild constipation. She was worried that small bowel may be in the inguinal hernia as her brother-in-law Dr. suggested. I do not think that is a case. There are 2 small and just contained fat based on CT scan. She feels reassured.  I think she would benefit from a fiber bowel regimen to help overcome mild constipation. Consider colonoscopy if it does not improve. Only mild/moderate constipation. Not severe. Current Plans Pt Education - [facility] Constipation (AT) Pt Education - [facility] Good Bowel Health (Ojani Berenson)  Adin Hector, M.D., F.A.C.S. Gastrointestinal and Minimally Invasive Surgery [facility] [address] [place], [place] [address] [phone] Main / Paging

## 2016-01-07 NOTE — Patient Instructions (Addendum)
[name]  01/07/2016   Your procedure is scheduled on: 01-22-16  Report to [facility] Main  Entrance take [facility]  elevators to 3rd floor to  [place] at 530  AM.  Call this number if you have problems the morning of surgery [phone]   Remember: ONLY 1 PERSON MAY GO WITH YOU TO SHORT STAY TO GET  READY MORNING OF [place].  Do not eat food or drink liquids :After Midnight.     Take these medicines the morning of surgery with A SIP OF WATER: none               You may not have any metal on your body including hair pins and              piercings  Do not wear jewelry, make-up, lotions, powders or perfumes, deodorant             Do not wear nail polish.  Do not shave  48 hours prior to surgery.     Do not bring valuables to the hospital. [place].  Contacts, dentures or bridgework may not be worn into surgery.      Patients discharged the day of surgery will not be allowed to drive home.  Name and phone number of your driver:  Special Instructions: coughing and deep breathing exercises, leg exercises               Please read over the following fact sheets you were given: _____________________________________________________________________             [facility] - Preparing for Surgery Before surgery, you can play an important role.  Because skin is not sterile, your skin needs to be as free of germs as possible.  You can reduce the number of germs on your skin by washing with CHG (chlorahexidine gluconate) soap before surgery.  CHG is an antiseptic cleaner which kills germs and bonds with the skin to continue killing germs even after washing. Please DO NOT use if you have an allergy to CHG or antibacterial soaps.  If your skin becomes reddened/irritated stop using the CHG and inform your nurse when you arrive at Short Stay. Do not shave (including legs and underarms) for at least 48 hours  prior to the first CHG shower.  You may shave your face/neck. Please follow these instructions carefully:  1.  Shower with CHG Soap the night before surgery and the  morning of Surgery.  2.  If you choose to wash your hair, wash your hair first as usual with your  normal  shampoo.  3.  After you shampoo, rinse your hair and body thoroughly to remove the  shampoo.                           4.  Use CHG as you would any other liquid soap.  You can apply chg directly  to the skin and wash                       Gently with a scrungie or clean washcloth.  5.  Apply the CHG Soap to your body ONLY FROM THE NECK DOWN.   Do not use on face/ open  Wound or open sores. Avoid contact with eyes, ears mouth and genitals (private parts).                       Wash face,  Genitals (private parts) with your normal soap.             6.  Wash thoroughly, paying special attention to the area where your surgery  will be performed.  7.  Thoroughly rinse your body with warm water from the neck down.  8.  DO NOT shower/wash with your normal soap after using and rinsing off  the CHG Soap.                9.  Pat yourself dry with a clean towel.            10.  Wear clean pajamas.            11.  Place clean sheets on your bed the night of your first shower and do not  sleep with pets. Day of Surgery : Do not apply any lotions/deodorants the morning of surgery.  Please wear clean clothes to the hospital/surgery center.  FAILURE TO FOLLOW THESE INSTRUCTIONS MAY RESULT IN THE CANCELLATION OF YOUR SURGERY PATIENT SIGNATURE_________________________________  NURSE SIGNATURE__________________________________  ________________________________________________________________________

## 2016-01-11 ENCOUNTER — Encounter (HOSPITAL_COMMUNITY): Payer: Self-pay | Admitting: *Deleted

## 2016-01-11 ENCOUNTER — Encounter (HOSPITAL_COMMUNITY)
Admission: RE | Admit: 2016-01-11 | Discharge: 2016-01-11 | Disposition: A | Discharge status: Home / Self Care | Payer: Federal, State, Local not specified - PPO | Source: Ambulatory Visit | Attending: Surgery | Admitting: Surgery

## 2016-01-11 DIAGNOSIS — Z01812 Encounter for preprocedural laboratory examination: Secondary | ICD-10-CM | POA: Insufficient documentation

## 2016-01-11 HISTORY — DX: Headache, unspecified: R51.9

## 2016-01-11 HISTORY — DX: Headache: R51

## 2016-01-11 LAB — CBC
HEMATOCRIT: 38.8 % (ref 36.0–46.0)
HEMOGLOBIN: 13.3 g/dL (ref 12.0–15.0)
MCH: 30 pg (ref 26.0–34.0)
MCHC: 34.3 g/dL (ref 30.0–36.0)
MCV: 87.6 fL (ref 78.0–100.0)
Platelets: 245 10*3/uL (ref 150–400)
RBC: 4.43 MIL/uL (ref 3.87–5.11)
RDW: 12.6 % (ref 11.5–15.5)
WBC: 5.2 10*3/uL (ref 4.0–10.5)

## 2016-01-11 NOTE — Progress Notes (Signed)
Spoke with [name] at [place] office of [facility] and informed that patient saw the Acetaminophen on my worksheet as a preop drug and informed us that cannnot take Tylenol .  Tylenol causes her to have headaches.  [name] stated she would send Dr Johney Maine a message.

## 2016-01-22 ENCOUNTER — Ambulatory Visit (HOSPITAL_COMMUNITY): Payer: Federal, State, Local not specified - PPO | Admitting: Anesthesiology

## 2016-01-22 ENCOUNTER — Encounter (HOSPITAL_COMMUNITY)
Admission: RE | Disposition: A | Discharge status: Home / Self Care | Payer: Self-pay | Source: Ambulatory Visit | Attending: Surgery

## 2016-01-22 ENCOUNTER — Ambulatory Visit (HOSPITAL_COMMUNITY)
Admission: RE | Admit: 2016-01-22 | Discharge: 2016-01-22 | Disposition: A | Discharge status: Home / Self Care | Payer: Federal, State, Local not specified - PPO | Source: Ambulatory Visit | Attending: Surgery | Admitting: Surgery

## 2016-01-22 ENCOUNTER — Encounter (HOSPITAL_COMMUNITY): Payer: Self-pay | Admitting: Anesthesiology

## 2016-01-22 DIAGNOSIS — K5909 Other constipation: Secondary | ICD-10-CM | POA: Insufficient documentation

## 2016-01-22 DIAGNOSIS — G43909 Migraine, unspecified, not intractable, without status migrainosus: Secondary | ICD-10-CM | POA: Insufficient documentation

## 2016-01-22 DIAGNOSIS — T884XXA Failed or difficult intubation, initial encounter: Secondary | ICD-10-CM

## 2016-01-22 DIAGNOSIS — Z9079 Acquired absence of other genital organ(s): Secondary | ICD-10-CM | POA: Diagnosis not present

## 2016-01-22 DIAGNOSIS — K4020 Bilateral inguinal hernia, without obstruction or gangrene, not specified as recurrent: Secondary | ICD-10-CM | POA: Diagnosis not present

## 2016-01-22 DIAGNOSIS — N83202 Unspecified ovarian cyst, left side: Secondary | ICD-10-CM | POA: Diagnosis not present

## 2016-01-22 DIAGNOSIS — Z90721 Acquired absence of ovaries, unilateral: Secondary | ICD-10-CM | POA: Diagnosis not present

## 2016-01-22 DIAGNOSIS — K4120 Bilateral femoral hernia, without obstruction or gangrene, not specified as recurrent: Secondary | ICD-10-CM | POA: Insufficient documentation

## 2016-01-22 DIAGNOSIS — Z79899 Other long term (current) drug therapy: Secondary | ICD-10-CM | POA: Diagnosis not present

## 2016-01-22 DIAGNOSIS — D1779 Benign lipomatous neoplasm of other sites: Secondary | ICD-10-CM | POA: Diagnosis not present

## 2016-01-22 DIAGNOSIS — Z90710 Acquired absence of both cervix and uterus: Secondary | ICD-10-CM | POA: Diagnosis not present

## 2016-01-22 HISTORY — DX: Failed or difficult intubation, initial encounter: T88.4XXA

## 2016-01-22 HISTORY — PX: INGUINAL HERNIA REPAIR: SHX194

## 2016-01-22 SURGERY — REPAIR, HERNIA, INGUINAL, BILATERAL, LAPAROSCOPIC
Anesthesia: General | Site: Abdomen | Laterality: Bilateral

## 2016-01-22 MED ORDER — BUPIVACAINE LIPOSOME 1.3 % IJ SUSP
20.0000 mL | Freq: Once | INTRAMUSCULAR | Status: DC
  Filled 2016-01-22: qty 20

## 2016-01-22 MED ORDER — CHLORHEXIDINE GLUCONATE CLOTH 2 % EX PADS: 6.0000 | MEDICATED_PAD | Freq: Once | CUTANEOUS | Status: DC

## 2016-01-22 MED ORDER — ATROPINE SULFATE 0.4 MG/ML IV SOSY
PREFILLED_SYRINGE | INTRAVENOUS | Status: AC
  Filled 2016-01-22: qty 2.5

## 2016-01-22 MED ORDER — SUGAMMADEX SODIUM 200 MG/2ML IV SOLN
INTRAVENOUS | Status: AC
  Filled 2016-01-22: qty 2

## 2016-01-22 MED ORDER — DEXAMETHASONE SODIUM PHOSPHATE 10 MG/ML IJ SOLN
INTRAMUSCULAR | Status: AC
  Filled 2016-01-22: qty 1

## 2016-01-22 MED ORDER — ONDANSETRON HCL 4 MG/2ML IJ SOLN
INTRAMUSCULAR | Status: DC | PRN
  Administered 2016-01-22: 4 mg via INTRAVENOUS

## 2016-01-22 MED ORDER — LACTATED RINGERS IV SOLN
INTRAVENOUS | Status: DC
  Administered 2016-01-22: 1000 mL via INTRAVENOUS

## 2016-01-22 MED ORDER — ROCURONIUM BROMIDE 100 MG/10ML IV SOLN
INTRAVENOUS | Status: DC | PRN
  Administered 2016-01-22: 5 mg via INTRAVENOUS
  Administered 2016-01-22: 50 mg via INTRAVENOUS

## 2016-01-22 MED ORDER — MIDAZOLAM HCL 2 MG/2ML IJ SOLN
INTRAMUSCULAR | Status: DC | PRN
  Administered 2016-01-22: 2 mg via INTRAVENOUS

## 2016-01-22 MED ORDER — EPHEDRINE 5 MG/ML INJ
INTRAVENOUS | Status: AC
  Filled 2016-01-22: qty 10

## 2016-01-22 MED ORDER — MEPERIDINE HCL 25 MG/ML IJ SOLN
INTRAMUSCULAR | Status: DC | PRN
  Administered 2016-01-22: 12.5 mg via INTRAVENOUS

## 2016-01-22 MED ORDER — BUPIVACAINE-EPINEPHRINE 0.25% -1:200000 IJ SOLN
INTRAMUSCULAR | Status: DC | PRN
  Administered 2016-01-22: 50 mL

## 2016-01-22 MED ORDER — PROMETHAZINE HCL 25 MG/ML IJ SOLN: 6.2500 mg | INTRAMUSCULAR | Status: DC | PRN

## 2016-01-22 MED ORDER — SUFENTANIL CITRATE 50 MCG/ML IV SOLN
INTRAVENOUS | Status: DC | PRN
  Administered 2016-01-22 (×2): 10 ug via INTRAVENOUS

## 2016-01-22 MED ORDER — ROCURONIUM BROMIDE 10 MG/ML (PF) SYRINGE
PREFILLED_SYRINGE | INTRAVENOUS | Status: AC
  Filled 2016-01-22: qty 10

## 2016-01-22 MED ORDER — DEXAMETHASONE SODIUM PHOSPHATE 10 MG/ML IJ SOLN
INTRAMUSCULAR | Status: DC | PRN
  Administered 2016-01-22: 10 mg via INTRAVENOUS

## 2016-01-22 MED ORDER — PROPOFOL 10 MG/ML IV BOLUS
INTRAVENOUS | Status: DC | PRN
  Administered 2016-01-22: 50 mg via INTRAVENOUS
  Administered 2016-01-22: 100 mg via INTRAVENOUS

## 2016-01-22 MED ORDER — MEPERIDINE HCL 50 MG/ML IJ SOLN
INTRAMUSCULAR | Status: AC
  Filled 2016-01-22: qty 1

## 2016-01-22 MED ORDER — FENTANYL CITRATE (PF) 100 MCG/2ML IJ SOLN: 25.0000 ug | INTRAMUSCULAR | Status: DC | PRN

## 2016-01-22 MED ORDER — SUFENTANIL CITRATE 50 MCG/ML IV SOLN
INTRAVENOUS | Status: AC
  Filled 2016-01-22: qty 1

## 2016-01-22 MED ORDER — MIDAZOLAM HCL 2 MG/2ML IJ SOLN
INTRAMUSCULAR | Status: AC
  Filled 2016-01-22: qty 2

## 2016-01-22 MED ORDER — BUPIVACAINE-EPINEPHRINE 0.25% -1:200000 IJ SOLN
INTRAMUSCULAR | Status: AC
Start: 2016-01-22 — End: 2016-01-22
  Filled 2016-01-22: qty 1

## 2016-01-22 MED ORDER — SUGAMMADEX SODIUM 200 MG/2ML IV SOLN
INTRAVENOUS | Status: DC | PRN
  Administered 2016-01-22: 200 mg via INTRAVENOUS

## 2016-01-22 MED ORDER — SODIUM CHLORIDE 0.9 % IJ SOLN
INTRAMUSCULAR | Status: AC
  Filled 2016-01-22: qty 10

## 2016-01-22 MED ORDER — TRAMADOL HCL 50 MG PO TABS: 50.0000 mg | ORAL_TABLET | Freq: Four times a day (QID) | ORAL | 0 refills | Status: DC | PRN

## 2016-01-22 MED ORDER — BUPIVACAINE-EPINEPHRINE 0.25% -1:200000 IJ SOLN
INTRAMUSCULAR | Status: AC
  Filled 2016-01-22: qty 1

## 2016-01-22 MED ORDER — CELECOXIB 200 MG PO CAPS
400.0000 mg | ORAL_CAPSULE | ORAL | Status: AC
  Administered 2016-01-22: 400 mg via ORAL
  Filled 2016-01-22: qty 2

## 2016-01-22 MED ORDER — KETOROLAC TROMETHAMINE 30 MG/ML IJ SOLN
INTRAMUSCULAR | Status: AC
  Filled 2016-01-22: qty 1

## 2016-01-22 MED ORDER — ATROPINE SULFATE 0.4 MG/ML IJ SOLN
INTRAMUSCULAR | Status: DC | PRN
  Administered 2016-01-22: 0.4 mg via INTRAVENOUS

## 2016-01-22 MED ORDER — GABAPENTIN 300 MG PO CAPS
300.0000 mg | ORAL_CAPSULE | ORAL | Status: AC
  Administered 2016-01-22: 300 mg via ORAL
  Filled 2016-01-22: qty 1

## 2016-01-22 MED ORDER — KETOROLAC TROMETHAMINE 30 MG/ML IJ SOLN
INTRAMUSCULAR | Status: DC | PRN
  Administered 2016-01-22: 30 mg via INTRAVENOUS

## 2016-01-22 MED ORDER — LIDOCAINE HCL (CARDIAC) 20 MG/ML IV SOLN
INTRAVENOUS | Status: DC | PRN
  Administered 2016-01-22: 100 mg via INTRAVENOUS

## 2016-01-22 MED ORDER — CEFAZOLIN SODIUM-DEXTROSE 2-4 GM/100ML-% IV SOLN
2.0000 g | INTRAVENOUS | Status: AC
  Administered 2016-01-22: 2 g via INTRAVENOUS

## 2016-01-22 MED ORDER — SUCCINYLCHOLINE CHLORIDE 20 MG/ML IJ SOLN
INTRAMUSCULAR | Status: DC | PRN
  Administered 2016-01-22: 40 mg via INTRAVENOUS
  Administered 2016-01-22: 60 mg via INTRAVENOUS

## 2016-01-22 MED ORDER — ONDANSETRON HCL 4 MG/2ML IJ SOLN
INTRAMUSCULAR | Status: AC
  Filled 2016-01-22: qty 2

## 2016-01-22 MED ORDER — CEFAZOLIN SODIUM-DEXTROSE 2-4 GM/100ML-% IV SOLN
INTRAVENOUS | Status: AC
  Filled 2016-01-22: qty 100

## 2016-01-22 MED ORDER — LACTATED RINGERS IR SOLN
Status: DC | PRN
  Administered 2016-01-22: 1000 mL

## 2016-01-22 MED ORDER — PROPOFOL 10 MG/ML IV BOLUS
INTRAVENOUS | Status: AC
  Filled 2016-01-22: qty 20

## 2016-01-22 MED ORDER — LACTATED RINGERS IV SOLN
INTRAVENOUS | Status: DC | PRN
  Administered 2016-01-22: 07:00:00 via INTRAVENOUS

## 2016-01-22 SURGICAL SUPPLY — 32 items
CABLE HIGH FREQUENCY MONO STRZ (ELECTRODE) ×3 IMPLANT
CHLORAPREP W/TINT 26ML (MISCELLANEOUS) ×3 IMPLANT
COVER SURGICAL LIGHT HANDLE (MISCELLANEOUS) ×3 IMPLANT
DECANTER SPIKE VIAL GLASS SM (MISCELLANEOUS) ×3 IMPLANT
DEVICE SECURE STRAP 25 ABSORB (INSTRUMENTS) IMPLANT
DRAPE LAPAROSCOPIC ABDOMINAL (DRAPES) ×3 IMPLANT
DRAPE WARM FLUID 44X44 (DRAPE) ×3 IMPLANT
DRSG TEGADERM 2-3/8X2-3/4 SM (GAUZE/BANDAGES/DRESSINGS) ×6 IMPLANT
DRSG TEGADERM 4X4.75 (GAUZE/BANDAGES/DRESSINGS) ×3 IMPLANT
ELECT REM PT RETURN 9FT ADLT (ELECTROSURGICAL) ×3
ELECTRODE REM PT RTRN 9FT ADLT (ELECTROSURGICAL) ×2 IMPLANT
GAUZE SPONGE 2X2 8PLY STRL LF (GAUZE/BANDAGES/DRESSINGS) ×2 IMPLANT
GLOVE ECLIPSE 8.0 STRL XLNG CF (GLOVE) ×3 IMPLANT
GLOVE INDICATOR 8.0 STRL GRN (GLOVE) ×3 IMPLANT
GOWN STRL REUS W/TWL XL LVL3 (GOWN DISPOSABLE) ×6 IMPLANT
IRRIG SUCT STRYKERFLOW 2 WTIP (MISCELLANEOUS)
IRRIGATION SUCT STRKRFLW 2 WTP (MISCELLANEOUS) IMPLANT
KIT BASIN OR (CUSTOM PROCEDURE TRAY) ×3 IMPLANT
MESH ULTRAPRO 6X6 15CM15CM (Mesh General) ×6 IMPLANT
PAD POSITIONING PINK XL (MISCELLANEOUS) ×3 IMPLANT
SCISSORS LAP 5X35 DISP (ENDOMECHANICALS) ×3 IMPLANT
SLEEVE ADV FIXATION 5X100MM (TROCAR) ×3 IMPLANT
SPONGE GAUZE 2X2 STER 10/PKG (GAUZE/BANDAGES/DRESSINGS) ×1
SUT MNCRL AB 4-0 PS2 18 (SUTURE) ×3 IMPLANT
SUT VIC AB 3-0 SH 27 (SUTURE) ×2
SUT VIC AB 3-0 SH 27XBRD (SUTURE) ×4 IMPLANT
TACKER 5MM HERNIA 3.5CML NAB (ENDOMECHANICALS) IMPLANT
TOWEL OR 17X26 10 PK STRL BLUE (TOWEL DISPOSABLE) ×3 IMPLANT
TRAY LAPAROSCOPIC (CUSTOM PROCEDURE TRAY) ×3 IMPLANT
TROCAR ADV FIXATION 5X100MM (TROCAR) ×3 IMPLANT
TROCAR XCEL BLUNT TIP 100MML (ENDOMECHANICALS) ×3 IMPLANT
TUBING INSUF HEATED (TUBING) ×3 IMPLANT

## 2016-01-22 NOTE — Interval H&P Note (Signed)
History and Physical Interval Note:  01/22/2016 7:32 AM  [name]  has presented today for surgery, with the diagnosis of Bilateral inguinal hernias  The various methods of treatment have been discussed with the patient and family. After consideration of risks, benefits and other options for treatment, the patient has consented to  Procedure(s): Wright (Bilateral) INSERTION OF MESH (Bilateral) as a surgical intervention .  The patient's history has been reviewed, patient examined, no change in status, stable for surgery.  I have reviewed the patient's chart and labs.  Questions were answered to the patient's satisfaction.     Satsuki Zillmer C.

## 2016-01-22 NOTE — H&P (Signed)
[name]  Location: [facility] Patient #: [id number] DOB: 12/20/1970 Married / Language: English / Race: White Female  Patient Care Team: Lona Kettle, MD as PCP - General (Family Medicine) Michael Boston, MD as Consulting Physician (General Surgery) Brien Few, MD as Consulting Physician (Obstetrics and Gynecology)   History of Present Illness  The patient is a 45 year old female who presents for an evaluation of a hernia. Note for "Hernia": Patient sent for surgical consultation by her gynecologist, Dr. Bryson Corona. Concern for left inguinal hernia.  Pleasant Hispanic woman. Originally from [place] and then [place]. Has been in [place] for many years now. Noticed left lower quadrant/groin pain intermittently over the past year. Now constant soreness. Discussed with her primary care physician. Concern perhaps an ovarian issue based on studies. Saw her gynecologist. She does have a 5 cm left ovarian cyst. She already has had a hysterectomy and right salpingo-oophorectomy. Dr. Ronita Hipps suspected more hernia issue based on a recent CT scan that showed small bilateral fat-containing inguinal hernias. Patient has a brother-in-law is a doctor that wondered if she had an incarcerated hernia full of small intestine given the fact that the patient has had some constipation issues. However it's not severe. 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 Can walk a half hour without difficulty. No history kidney stones. No problems with urination or urinary tract infections. She's never abdominal surgery. She did have an episode of severe headaches after a skiing accident and is seen her neurologist. She is on a headache medicine that she thinks is an antiseizure medicine. She's been warned against using Tylenol or other over-the-counter pain medications with that.  No new events   Other Problems Illene Regulus, CMA; 11/13/2015 9:49 AM) Heart murmur Migraine  Headache  Past Surgical History Illene Regulus, CMA; 11/13/2015 9:49 AM) Thyroid Surgery  Diagnostic Studies History Lars Mage Spillers, CMA; 11/13/2015 9:49 AM) Mammogram >3 years ago  Allergies Lars Mage Spillers, CMA; 11/13/2015 9:50 AM) Bactrim *ANTI-INFECTIVE AGENTS - MISC.*    Review of Systems Lars Mage Spillers CMA; 11/13/2015 9:49 AM) Gastrointestinal Present- Abdominal Pain. Not Present- Bloating, Bloody Stool, Change in Bowel Habits, Chronic diarrhea, Constipation, Difficulty Swallowing, Excessive gas, Gets full quickly at meals, Hemorrhoids, Indigestion, Nausea, Rectal Pain and Vomiting.  Vitals (Alisha Spillers CMA; 11/13/2015 9:50 AM) 11/13/2015 9:49 AM Weight: 130 lb Height: 60in Body Surface Area: 1.55 m Body Mass Index: 25.39 kg/m  Pulse: 54 (Regular)  BP: 120/70 (Sitting, Left Arm, Standard)  BP (!) 144/77 (BP Location: Right Arm)   Pulse 65   Temp 98.3 F (36.8 C) (Oral)   Resp 18   LMP 08/10/2011   SpO2 99%       Physical Exam Adin Hector MD; 11/13/2015 10:24 AM) General Mental Status-Alert. General Appearance-Not in acute distress, Not Sickly. Orientation-Oriented X3. Hydration-Well hydrated. Voice-Normal.  Integumentary Global Assessment Upon inspection and palpation of skin surfaces of the - Axillae: non-tender, no inflammation or ulceration, no drainage. and Distribution of scalp and body hair is normal. General Characteristics Temperature - normal warmth is noted.  Head and Neck Head-normocephalic, atraumatic with no lesions or palpable masses. Face Global Assessment - atraumatic, no absence of expression. Neck Global Assessment - no abnormal movements, no bruit auscultated on the right, no bruit auscultated on the left, no decreased range of motion, non-tender. Trachea-midline. Thyroid Gland Characteristics - non-tender.  Eye Eyeball - Left-Extraocular movements intact, No Nystagmus. Eyeball -  Right-Extraocular movements intact, No Nystagmus. Cornea -  Left-No Hazy. Cornea - Right-No Hazy. Sclera/Conjunctiva - Left-No scleral icterus, No Discharge. Sclera/Conjunctiva - Right-No scleral icterus, No Discharge. Pupil - Left-Direct reaction to light normal. Pupil - Right-Direct reaction to light normal.  ENMT Ears Pinna - Left - no drainage observed, no generalized tenderness observed. Right - no drainage observed, no generalized tenderness observed. Nose and Sinuses External Inspection of the Nose - no destructive lesion observed. Inspection of the nares - Left - quiet respiration. Right - quiet respiration. Mouth and Throat Lips - Upper Lip - no fissures observed, no pallor noted. Lower Lip - no fissures observed, no pallor noted. Nasopharynx - no discharge present. Oral Cavity/Oropharynx - Tongue - no dryness observed. Oral Mucosa - no cyanosis observed. Hypopharynx - no evidence of airway distress observed.  Chest and Lung Exam Inspection Movements - Normal and Symmetrical. Accessory muscles - No use of accessory muscles in breathing. Palpation Palpation of the chest reveals - Non-tender. Auscultation Breath sounds - Normal and Clear.  Cardiovascular Auscultation Rhythm - Regular. Murmurs & Other Heart Sounds - Auscultation of the heart reveals - No Murmurs and No Systolic Clicks.  Abdomen Inspection Inspection of the abdomen reveals - No Visible peristalsis and No Abnormal pulsations. Umbilicus - No Bleeding, No Urine drainage. Palpation/Percussion Palpation and Percussion of the abdomen reveal - Soft, Non Tender, No Rebound tenderness, No Rigidity (guarding) and No Cutaneous hyperesthesia. Note: Abdomen soft. Nontender, nondistended. No guarding. No umbilical no other hernias   Female Genitourinary Sexual Maturity Tanner 5 - Adult hair pattern. Note: Discomfort in left groin. Less on the right. Subtle bulging with coughing left greater than right. No  vaginal bleeding nor discharge   Peripheral Vascular Upper Extremity Inspection - Left - No Cyanotic nailbeds, Not Ischemic. Right - No Cyanotic nailbeds, Not Ischemic.  Neurologic Neurologic evaluation reveals -normal attention span and ability to concentrate, able to name objects and repeat phrases. Appropriate fund of knowledge , normal sensation and normal coordination. Mental Status Affect - not angry, not paranoid. Cranial Nerves-Normal Bilaterally. Gait-Normal.  Neuropsychiatric Mental status exam performed with findings of-able to articulate well with normal speech/language, rate, volume and coherence, thought content normal with ability to perform basic computations and apply abstract reasoning and no evidence of hallucinations, delusions, obsessions or homicidal/suicidal ideation.  Musculoskeletal Global Assessment Spine, Ribs and Pelvis - no instability, subluxation or laxity. Right Upper Extremity - no instability, subluxation or laxity.  Lymphatic Head & Neck  General Head & Neck Lymphatics: Bilateral - Description - No Localized lymphadenopathy. Axillary  General Axillary Region: Bilateral - Description - No Localized lymphadenopathy. Femoral & Inguinal  Generalized Femoral & Inguinal Lymphatics: Left - Description - No Localized lymphadenopathy. Right - Description - No Localized lymphadenopathy.    Assessment & Plan  BILATERAL INGUINAL HERNIA WITHOUT OBSTRUCTION OR GANGRENE, RECURRENCE NOT SPECIFIED (K40.20) Impression: Bilateral inguinal hernias seen on CT scan. Left side rather symptomatic. They are not large. However I suspect given the fact that her pain is reproduced with activity and straining, it is most likely the etiology for her groin pain.  I think she would benefit from surgery to repair both areas since she has evidence on the contralateral side as well. Laparoscopic underlay repair mesh.  Should she require her left ovary removed for a  persistent or enlarging cyst, can coordinate with Dr. Ronita Hipps. Can try and do this robotically fats which Dr. Ronita Hipps prefers. She is due to see him next month for a follow-up ultrasound and will let me know what  the plan as. Her graft I think it is reasonable to control pain with ice and heat. Usually recommend her over-the-counter anti-inflammatories. She recalls being told by her neurologist to avoid some of those medications do not conflict with her headache medicine. She cannot recall the name of the medicine now. She will touch weightbase with the neurologist. She feels reassured that it is not incarcerated with small bowel bowel. She has a lot of relatives that her physicians that have been giving her advice. While Dr. Hassell Done in our group did her thyroid surgery many years ago, she feels comfortable with me being her hernia surgeon should it come to that.  PREOP - ING HERNIA - ENCOUNTER FOR PREOPERATIVE EXAMINATION FOR GENERAL SURGICAL PROCEDURE (Z01.818) Current Plans You are being scheduled for surgery - Our schedulers will call you.  You should hear from our office's scheduling department within 5 working days about the location, date, and time of surgery. We try to make accommodations for patient's preferences in scheduling surgery, but sometimes the OR schedule or the surgeon's schedule prevents [place] from making those accommodations.  If you have not heard from our office [phone]) in 5 working days, call the office and ask for your surgeon's nurse.  If you have other questions about your diagnosis, plan, or surgery, call the office and ask for your surgeon's nurse.  Written instructions provided The anatomy & physiology of the abdominal wall and pelvic floor was discussed. The pathophysiology of hernias in the inguinal and pelvic region was discussed. Natural history risks such as progressive enlargement, pain, incarceration, and strangulation was discussed. Contributors to complications  such as smoking, obesity, diabetes, prior surgery, etc were discussed.  I feel the risks of no intervention will lead to serious problems that outweigh the operative risks; therefore, I recommended surgery to reduce and repair the hernia. I explained laparoscopic techniques with possible need for an open approach. I noted usual use of mesh to patch and/or buttress hernia repair  Risks such as bleeding, infection, abscess, need for further treatment, heart attack, death, and other risks were discussed. I noted a good likelihood this will help address the problem. Goals of post-operative recovery were discussed as well. Possibility that this will not correct all symptoms was explained. I stressed the importance of low-impact activity, aggressive pain control, avoiding constipation, & not pushing through pain to minimize risk of post-operative chronic pain or injury. Possibility of reherniation was discussed. We will work to minimize complications.  An educational handout further explaining the pathology & treatment options was given as well. Questions were answered. The patient expresses understanding & wishes to proceed with surgery.  Pt Education - Pamphlet Given - Laparoscopic Hernia Repair: discussed with patient and provided information. Pt Education - [facility]CS Pain Control (Tovia Kisner) Pt Education - [facility] Hernia Post-Op HCI (Osborne Serio): discussed with patient and provided information. CHRONIC CONSTIPATION (K59.09) Impression: Mild constipation. She was worried that small bowel may be in the inguinal hernia as her brother-in-law Dr. suggested. I do not think that is a case. There are 2 small and just contained fat based on CT scan. She feels reassured.  I think she would benefit from a fiber bowel regimen to help overcome mild constipation. Consider colonoscopy if it does not improve. Only mild/moderate constipation. Not severe. Current Plans Pt Education - CCS Constipation (AT) Pt Education - [facility] Good Bowel  Health (Daaiel Starlin)  Adin Hector, M.D., F.A.C.S. Gastrointestinal and Minimally Invasive Surgery [facility] [address]  [place], [place] [address] [phone] Main / Paging

## 2016-01-22 NOTE — Anesthesia Preprocedure Evaluation (Signed)
Anesthesia Evaluation  Patient identified by MRN, date of birth, ID band Patient awake    Reviewed: Allergy & Precautions, NPO status , Patient's Chart, lab work & pertinent test results  Airway Mallampati: II  TM Distance: >3 FB Neck ROM: Full    Dental no notable dental hx.    Pulmonary neg pulmonary ROS,    Pulmonary exam normal breath sounds clear to auscultation       Cardiovascular negative cardio ROS Normal cardiovascular exam Rhythm:Regular Rate:Normal     Neuro/Psych  Headaches, Anxiety    GI/Hepatic negative GI ROS, Neg liver ROS,   Endo/Other  negative endocrine ROS  Renal/GU negative Renal ROS  negative genitourinary   Musculoskeletal negative musculoskeletal ROS (+)   Abdominal   Peds negative pediatric ROS (+)  Hematology  (+) anemia ,   Anesthesia Other Findings   Reproductive/Obstetrics negative OB ROS                             Anesthesia Physical Anesthesia Plan  ASA: I  Anesthesia Plan: General   Post-op Pain Management:    Induction: Intravenous  Airway Management Planned: Oral ETT  Additional Equipment:   Intra-op Plan:   Post-operative Plan: Extubation in OR  Informed Consent: I have reviewed the patients History and Physical, chart, labs and discussed the procedure including the risks, benefits and alternatives for the proposed anesthesia with the patient or authorized representative who has indicated his/her understanding and acceptance.   Dental advisory given  Plan Discussed with: CRNA  Anesthesia Plan Comments:         Anesthesia Quick Evaluation

## 2016-01-22 NOTE — Op Note (Signed)
01/22/2016  9:24 AM  PATIENT:  [name]  45 y.o. female  Patient Care Team: Lona Kettle, MD as PCP - General (Family Medicine) Michael Boston, MD as Consulting Physician (General Surgery) Brien Few, MD as Consulting Physician (Obstetrics and Gynecology)  PRE-OPERATIVE DIAGNOSIS:  Bilateral inguinal hernias  POST-OPERATIVE DIAGNOSIS:    Bilateral inguinal hernias Bilateral femoral hernias  PROCEDURE:   LAPAROSCOPIC BILATERAL FEMORAL WITH INSERTION OF MESH LAPAROSCOPIC BILATERAL INGUINAL HERNIA REPAIRS WITH INSERTION OF MESH  SURGEON:  Surgeon(s): Michael Boston, MD  ASSISTANT: RNFA   ANESTHESIA:   Regional ilioinguinal and genitofemoral and spermatic cord nerve blocks with GETA  EBL:  Total I/O In: -  Out: 57 [Blood:50]  Delay start of Pharmacological VTE agent (>24hrs) due to surgical blood loss or risk of bleeding:  no  DRAINS: NONE  SPECIMEN:  NONE  DISPOSITION OF SPECIMEN:  N/A  COUNTS:  YES  PLAN OF CARE: Discharge to home after PACU  PATIENT DISPOSITION:  PACU - hemodynamically stable.  INDICATION:   Patient with worsening left-sided groin pain.  Status post hysterectomy and right salpingo-oophorectomy.  Had CT scan showing bilateral hernias.  Suspicion that the left was definite symptomatic.  I offered laparoscopic exploration and repair of hernias found.  The anatomy & physiology of the abdominal wall and pelvic floor was discussed.  The pathophysiology of hernias in the inguinal and pelvic region was discussed.  Natural history risks such as progressive enlargement, pain, incarceration & strangulation was discussed.   Contributors to complications such as smoking, obesity, diabetes, prior surgery, etc were discussed.    I feel the risks of no intervention will lead to serious problems that outweigh the operative risks; therefore, I recommended surgery to reduce and repair the hernia.  I explained laparoscopic techniques with possible need for an open  approach.  I noted usual use of mesh to patch and/or buttress hernia repair  Risks such as bleeding, infection, abscess, need for further treatment, heart attack, death, and other risks were discussed.  I noted a good likelihood this will help address the problem.   Goals of post-operative recovery were discussed as well.  Possibility that this will not correct all symptoms was explained.  I stressed the importance of low-impact activity, aggressive pain control, avoiding constipation, & not pushing through pain to minimize risk of post-operative chronic pain or injury. Possibility of reherniation was discussed.  We will work to minimize complications.     An educational handout further explaining the pathology & treatment options was given as well.  Questions were answered.  The patient expresses understanding & wishes to proceed with surgery.  OR FINDINGS: Patient had right greater than left inguinal hernias with inguinal canal lipomas.  Patient had left greater than right femoral hernias.  Repair with underlay 15 x 15 cm mesh.  Ultrapro.  DESCRIPTION:   The patient was identified & brought into the operating room. The patient was positioned supine with arms tucked. SCDs were active during the entire case. The patient underwent general anesthesia without any difficulty.  The abdomen was prepped and draped in a sterile fashion. The patient's bladder was emptied.  A Surgical Timeout confirmed our plan.  I made a transverse incision through the inferior umbilical fold.  I made a small transverse nick through the anterior rectus fascia contralateral to the inguinal hernia side and placed a 0-vicryl stitch through the fascia.  I placed a Hasson trocar into the preperitoneal plane.  Entry was clean.  We induced carbon  dioxide insufflation. Camera inspection revealed no injury.  I used a 28mm angled scope to bluntly free the peritoneum off the infraumbilical anterior abdominal wall.  I created enough of a  preperitoneal pocket to place 56mm ports into the right & left mid-abdomen into this preperitoneal cavity.  I focused attention on the left side since that was the symptomatic hernia side.   I used blunt & focused sharp dissection to free the peritoneum off the flank and down to the pubic rim.  I freed the anteriolateral bladder wall off the anteriolateral pelvic wall, sparing midline attachments.   I located a swath of peritoneum going into a hernia fascial defect at the internal ring and femoral canal consistent with indirect inguinal and femoral hernias.  I gradually freed the peritoneal hernia sac off safely and reduced it into the preperitoneal space.  I freed the peritoneum off the round ligament spermatic vessels & vas deferens.  Really was quite atrophic and it up having transected freed off.  Closed the peritoneal defect at that location.  This consisted of prior adhesions from her hysterectomy and nephrectomy I freed peritoneum off the retroperitoneum along the psoas muscle.    I checked & assured hemostasis.    I turned attention on the opposite side.  I did dissection in a similar, mirror-image fashion. The patient had a slightly larger right indirect inguinal hernia.  A more subtle but definite right femoral hernias well.  There are no evidence of any direct space nor obturator hernias.      I chose 15x15 cm sheets of ultra-lightweight polypropylene mesh (Ultrapro), one for each side.  I cut a single sigmoid-shaped slit ~6cm from a corner of each mesh.  I placed the meshes into the preperitoneal space & laid them as overlapping diamonds such that at the inferior points, a 6x6 cm corner flap rested in the true anterolateral pelvis, covering the obturator & femoral foramina.   I allowed the bladder to return to the pubis, this helping tuck the corners of the mesh in the anteriolateral pelvis.  The medial corners overlapped each other across midline cephalad to the pubic rim.   This provided >2 inch  coverage around the hernia.  Because the defects well covered and not particularly large, I did not place any tacks.  I held the hernia sacs cephalad & evacuated carbon dioxide.  I closed the fascia with absorbable suture.  I closed the skin using 4-0 monocryl stitch.  Sterile dressings were applied.   The patient was extubated & arrived in the PACU in stable condition..  I had discussed postoperative care with the patient in the holding area.  Instructions are written in the chart.  I discussed operative findings, updated the patient's status, discussed probable steps to recovery, and gave postoperative recommendations to the patient's spouse.  Recommendations were made.  Questions were answered.  He expressed understanding & appreciation.   Adin Hector, M.D., F.A.C.S. Gastrointestinal and Minimally Invasive Surgery [facility] [address], [place], [place] [address] [phone] Main / Paging

## 2016-01-22 NOTE — Anesthesia Postprocedure Evaluation (Signed)
Anesthesia Post Note  Patient: [name]  Procedure(s) Performed: Procedure(s) (LRB): LAPAROSCOPIC BILATERAL FEMORAL AND RIGHT INGUINAL HERNIA REPAIRWITH INSERTION OF MESH (Bilateral)  Patient location during evaluation: PACU Anesthesia Type: General Level of consciousness: awake and alert Pain management: pain level controlled Vital Signs Assessment: post-procedure vital signs reviewed and stable Respiratory status: spontaneous breathing, nonlabored ventilation, respiratory function stable and patient connected to nasal cannula oxygen Cardiovascular status: blood pressure returned to baseline and stable Postop Assessment: no signs of nausea or vomiting Anesthetic complications: no    Last Vitals:  Vitals:   01/22/16 1014 01/22/16 1122  BP: 125/77 117/71  Pulse: 94 92  Resp: 16 16  Temp: 36.9 C 37.1 C    Last Pain:  Vitals:   01/22/16 1122  TempSrc: Oral  PainSc: 1                  Mita Vallo J

## 2016-01-22 NOTE — Discharge Instructions (Signed)
HERNIA REPAIR: POST OP INSTRUCTIONS ° °###################################################################### ° °EAT °Gradually transition to a high fiber diet with a fiber supplement over the next few weeks after discharge.  Start with a pureed / full liquid diet (see below) ° °WALK °Walk an hour a day.  Control your pain to do that.   ° °CONTROL PAIN °Control pain so that you can walk, sleep, tolerate sneezing/coughing, go up/down stairs. ° °HAVE A BOWEL MOVEMENT DAILY °Keep your bowels regular to avoid problems.  OK to try a laxative to override constipation.  OK to use an antidairrheal to slow down diarrhea.  Call if not better after 2 tries ° °CALL IF YOU HAVE PROBLEMS/CONCERNS °Call if you are still struggling despite following these instructions. °Call if you have concerns not answered by these instructions ° °###################################################################### ° ° ° °1. DIET: Follow a light bland diet the first 24 hours after arrival home, such as soup, liquids, crackers, etc.  Be sure to include lots of fluids daily.  Avoid fast food or heavy meals as your are more likely to get nauseated.  Eat a low fat the next few days after surgery. °2. Take your usually prescribed home medications unless otherwise directed. °3. PAIN CONTROL: °a. Pain is best controlled by a usual combination of three different methods TOGETHER: °i. Ice/Heat °ii. Over the counter pain medication °iii. Prescription pain medication °b. Most patients will experience some swelling and bruising around the hernia(s) such as the bellybutton, groins, or old incisions.  Ice packs or heating pads (30-60 minutes up to 6 times a day) will help. Use ice for the first few days to help decrease swelling and bruising, then switch to heat to help relax tight/sore spots and speed recovery.  Some people prefer to use ice alone, heat alone, alternating between ice & heat.  Experiment to what works for you.  Swelling and bruising can take  several weeks to resolve.   °c. It is helpful to take an over-the-counter pain medication regularly for the first few weeks.  Choose one of the following that works best for you: °i. Naproxen (Aleve, etc)  Two 220mg tabs twice a day °ii. Ibuprofen (Advil, etc) Three 200mg tabs four times a day (every meal & bedtime) °iii. Acetaminophen (Tylenol, etc) 325-650mg four times a day (every meal & bedtime) °d. A  prescription for pain medication should be given to you upon discharge.  Take your pain medication as prescribed.  °i. If you are having problems/concerns with the prescription medicine (does not control pain, nausea, vomiting, rash, itching, etc), please call us [phone] to see if we need to switch you to a different pain medicine that will work better for you and/or control your side effect better. °ii. If you need a refill on your pain medication, please contact your pharmacy.  They will contact our office to request authorization. Prescriptions will not be filled after 5 pm or on week-ends. °4. Avoid getting constipated.  Between the surgery and the pain medications, it is common to experience some constipation.  Increasing fluid intake and taking a fiber supplement (such as Metamucil, Citrucel, FiberCon, MiraLax, etc) 1-2 times a day regularly will usually help prevent this problem from occurring.  A mild laxative (prune juice, Milk of Magnesia, MiraLax, etc) should be taken according to package directions if there are no bowel movements after 48 hours.   °5. Wash / shower every day.  You may shower over the dressings as they are waterproof.   °6. Remove   your waterproof bandages 5 days after surgery.  You may leave the incision open to air.  You may replace a dressing/Band-Aid to cover the incision for comfort if you wish.  Continue to shower over incision(s) after the dressing is off. ° ° ° °7. ACTIVITIES as tolerated:   °a. You may resume regular (light) daily activities beginning the next day--such  as daily self-care, walking, climbing stairs--gradually increasing activities as tolerated.  If you can walk 30 minutes without difficulty, it is safe to try more intense activity such as jogging, treadmill, bicycling, low-impact aerobics, swimming, etc. °b. Save the most intensive and strenuous activity for last such as sit-ups, heavy lifting, contact sports, etc  Refrain from any heavy lifting or straining until you are off narcotics for pain control.   °c. DO NOT PUSH THROUGH PAIN.  Let pain be your guide: If it hurts to do something, don't do it.  Pain is your body warning you to avoid that activity for another week until the pain goes down. °d. You may drive when you are no longer taking prescription pain medication, you can comfortably wear a seatbelt, and you can safely maneuver your car and apply brakes. °e. You may have sexual intercourse when it is comfortable.  °8. FOLLOW UP in our office °a. Please call [facility] at [phone] to set up an appointment to see your surgeon in the office for a follow-up appointment approximately 2-3 weeks after your surgery. °b. Make sure that you call for this appointment the day you arrive home to insure a convenient appointment time. °9.  IF YOU HAVE DISABILITY OR FAMILY LEAVE FORMS, BRING THEM TO THE OFFICE FOR PROCESSING.  DO NOT GIVE THEM TO YOUR DOCTOR. ° °WHEN TO CALL US [phone]: °1. Poor pain control °2. Reactions / problems with new medications (rash/itching, nausea, etc)  °3. Fever over 101.5 F (38.5 C) °4. Inability to urinate °5. Nausea and/or vomiting °6. Worsening swelling or bruising °7. Continued bleeding from incision. °8. Increased pain, redness, or drainage from the incision ° ° The clinic staff is available to answer your questions during regular business hours (8:30am-5pm).  Please don’t hesitate to call and ask to speak to one of our nurses for clinical concerns.  ° If you have a medical emergency, go to the nearest emergency room or call  911. ° A surgeon from [facility] is always on call at the hospitals in [place] ° °[facility] °[address], [address], [place], [place]  [address] ? ° [address] [address], [place], [place]   [address] °MAIN: [phone] ? TOLL FREE: [phone] ? FAX: [phone] °[email or web address] ° ° °

## 2016-01-22 NOTE — Anesthesia Procedure Notes (Addendum)
Procedure Name: Intubation Date/Time: 01/22/2016 7:52 AM Performed by: Danley Danker L Patient Re-evaluated:Patient Re-evaluated prior to inductionOxygen Delivery Method: Circle system utilized Preoxygenation: Pre-oxygenation with 100% oxygen Intubation Type: IV induction Ventilation: Mask ventilation with difficulty, Two handed mask ventilation required and Oral airway inserted - appropriate to patient size Laryngoscope Size: Glidescope and 3 Grade View: Grade I Tube type: Parker flex tip Tube size: 7.0 mm Number of attempts: 2 Airway Equipment and Method: Video-laryngoscopy (Difficult Mask ventilation requiring 2 hands) Placement Confirmation: ETT inserted through vocal cords under direct vision,  positive ETCO2 and breath sounds checked- equal and bilateral Secured at: 21 cm Tube secured with: Tape Dental Injury: Teeth and Oropharynx as per pre-operative assessment  Difficulty Due To: Difficulty was anticipated, Difficult Airway- due to reduced neck mobility, Difficult Airway- due to anterior larynx and Difficult Airway- due to limited oral opening Future Recommendations: Recommend- induction with short-acting agent, and alternative techniques readily available

## 2016-01-22 NOTE — Transfer of Care (Signed)
Immediate Anesthesia Transfer of Care Note  Patient: [name]  Procedure(s) Performed: Procedure(s): LAPAROSCOPIC BILATERAL FEMORAL AND RIGHT INGUINAL HERNIA REPAIRWITH INSERTION OF MESH (Bilateral)  Patient Location: PACU  Anesthesia Type:General  Level of Consciousness: awake  Airway & Oxygen Therapy: Patient Spontanous Breathing and Patient connected to face mask oxygen  Post-op Assessment: Report given to RN and Post -op Vital signs reviewed and stable  Post vital signs: Reviewed and stable  Last Vitals:  Vitals:   01/22/16 0545  BP: (!) 144/77  Pulse: 65  Resp: 18  Temp: 36.8 C    Last Pain:  Vitals:   01/22/16 0545  TempSrc: Oral      Patients Stated Pain Goal: 4 ([id number])  Complications: No apparent anesthesia complications

## 2016-02-12 DIAGNOSIS — K080 Exfoliation of teeth due to systemic causes: Secondary | ICD-10-CM | POA: Diagnosis not present

## 2016-02-26 DIAGNOSIS — F430 Acute stress reaction: Secondary | ICD-10-CM | POA: Diagnosis not present

## 2016-03-04 DIAGNOSIS — F430 Acute stress reaction: Secondary | ICD-10-CM | POA: Diagnosis not present

## 2016-03-13 DIAGNOSIS — F4310 Post-traumatic stress disorder, unspecified: Secondary | ICD-10-CM | POA: Diagnosis not present

## 2016-03-19 DIAGNOSIS — F430 Acute stress reaction: Secondary | ICD-10-CM | POA: Diagnosis not present

## 2016-03-27 DIAGNOSIS — F430 Acute stress reaction: Secondary | ICD-10-CM | POA: Diagnosis not present

## 2016-04-02 DIAGNOSIS — F4323 Adjustment disorder with mixed anxiety and depressed mood: Secondary | ICD-10-CM | POA: Diagnosis not present

## 2016-04-02 DIAGNOSIS — F410 Panic disorder [episodic paroxysmal anxiety] without agoraphobia: Secondary | ICD-10-CM | POA: Diagnosis not present

## 2016-04-09 DIAGNOSIS — F430 Acute stress reaction: Secondary | ICD-10-CM | POA: Diagnosis not present

## 2016-04-15 DIAGNOSIS — F430 Acute stress reaction: Secondary | ICD-10-CM | POA: Diagnosis not present

## 2016-04-23 DIAGNOSIS — F430 Acute stress reaction: Secondary | ICD-10-CM | POA: Diagnosis not present

## 2016-04-23 DIAGNOSIS — F410 Panic disorder [episodic paroxysmal anxiety] without agoraphobia: Secondary | ICD-10-CM | POA: Diagnosis not present

## 2016-04-23 DIAGNOSIS — F4323 Adjustment disorder with mixed anxiety and depressed mood: Secondary | ICD-10-CM | POA: Diagnosis not present

## 2016-04-25 DIAGNOSIS — G43719 Chronic migraine without aura, intractable, without status migrainosus: Secondary | ICD-10-CM | POA: Diagnosis not present

## 2016-04-29 DIAGNOSIS — F430 Acute stress reaction: Secondary | ICD-10-CM | POA: Diagnosis not present

## 2016-05-08 DIAGNOSIS — F430 Acute stress reaction: Secondary | ICD-10-CM | POA: Diagnosis not present

## 2016-05-15 DIAGNOSIS — F430 Acute stress reaction: Secondary | ICD-10-CM | POA: Diagnosis not present

## 2016-05-27 DIAGNOSIS — F430 Acute stress reaction: Secondary | ICD-10-CM | POA: Diagnosis not present

## 2016-06-05 DIAGNOSIS — F430 Acute stress reaction: Secondary | ICD-10-CM | POA: Diagnosis not present

## 2016-06-16 DIAGNOSIS — R1031 Right lower quadrant pain: Secondary | ICD-10-CM | POA: Diagnosis not present

## 2016-06-16 DIAGNOSIS — Z8719 Personal history of other diseases of the digestive system: Secondary | ICD-10-CM | POA: Diagnosis not present

## 2016-06-16 DIAGNOSIS — G8929 Other chronic pain: Secondary | ICD-10-CM | POA: Diagnosis not present

## 2016-06-16 DIAGNOSIS — F430 Acute stress reaction: Secondary | ICD-10-CM | POA: Diagnosis not present

## 2016-06-16 DIAGNOSIS — Z9889 Other specified postprocedural states: Secondary | ICD-10-CM | POA: Diagnosis not present

## 2016-06-17 DIAGNOSIS — R1031 Right lower quadrant pain: Secondary | ICD-10-CM | POA: Diagnosis not present

## 2016-06-20 DIAGNOSIS — G243 Spasmodic torticollis: Secondary | ICD-10-CM | POA: Diagnosis not present

## 2016-06-20 DIAGNOSIS — G44321 Chronic post-traumatic headache, intractable: Secondary | ICD-10-CM | POA: Diagnosis not present

## 2016-06-20 DIAGNOSIS — G43009 Migraine without aura, not intractable, without status migrainosus: Secondary | ICD-10-CM | POA: Diagnosis not present

## 2016-06-23 DIAGNOSIS — F430 Acute stress reaction: Secondary | ICD-10-CM | POA: Diagnosis not present

## 2016-06-27 ENCOUNTER — Other Ambulatory Visit: Payer: Self-pay | Admitting: Neurology

## 2016-06-27 DIAGNOSIS — R519 Headache, unspecified: Secondary | ICD-10-CM

## 2016-06-27 DIAGNOSIS — R51 Headache: Principal | ICD-10-CM

## 2016-07-08 DIAGNOSIS — F430 Acute stress reaction: Secondary | ICD-10-CM | POA: Diagnosis not present

## 2016-07-11 ENCOUNTER — Encounter (HOSPITAL_COMMUNITY): Payer: Self-pay | Admitting: *Deleted

## 2016-07-11 ENCOUNTER — Emergency Department (HOSPITAL_COMMUNITY): Payer: Federal, State, Local not specified - PPO

## 2016-07-11 ENCOUNTER — Inpatient Hospital Stay (HOSPITAL_COMMUNITY)
Admission: EM | Admit: 2016-07-11 | Discharge: 2016-07-17 | DRG: 234 | Disposition: A | Discharge status: Home / Self Care | Payer: Federal, State, Local not specified - PPO | Attending: Surgery | Admitting: Surgery

## 2016-07-11 DIAGNOSIS — I2510 Atherosclerotic heart disease of native coronary artery without angina pectoris: Secondary | ICD-10-CM | POA: Diagnosis not present

## 2016-07-11 DIAGNOSIS — Z79899 Other long term (current) drug therapy: Secondary | ICD-10-CM

## 2016-07-11 DIAGNOSIS — R51 Headache: Secondary | ICD-10-CM

## 2016-07-11 DIAGNOSIS — E7800 Pure hypercholesterolemia, unspecified: Secondary | ICD-10-CM | POA: Diagnosis present

## 2016-07-11 DIAGNOSIS — E8770 Fluid overload, unspecified: Secondary | ICD-10-CM | POA: Diagnosis not present

## 2016-07-11 DIAGNOSIS — E119 Type 2 diabetes mellitus without complications: Secondary | ICD-10-CM | POA: Diagnosis not present

## 2016-07-11 DIAGNOSIS — T463X5A Adverse effect of coronary vasodilators, initial encounter: Secondary | ICD-10-CM | POA: Diagnosis not present

## 2016-07-11 DIAGNOSIS — Z791 Long term (current) use of non-steroidal anti-inflammatories (NSAID): Secondary | ICD-10-CM | POA: Diagnosis not present

## 2016-07-11 DIAGNOSIS — R9431 Abnormal electrocardiogram [ECG] [EKG]: Secondary | ICD-10-CM | POA: Diagnosis not present

## 2016-07-11 DIAGNOSIS — R079 Chest pain, unspecified: Secondary | ICD-10-CM | POA: Diagnosis not present

## 2016-07-11 DIAGNOSIS — Z4682 Encounter for fitting and adjustment of non-vascular catheter: Secondary | ICD-10-CM | POA: Diagnosis not present

## 2016-07-11 DIAGNOSIS — I214 Non-ST elevation (NSTEMI) myocardial infarction: Secondary | ICD-10-CM | POA: Diagnosis not present

## 2016-07-11 DIAGNOSIS — R918 Other nonspecific abnormal finding of lung field: Secondary | ICD-10-CM | POA: Diagnosis not present

## 2016-07-11 DIAGNOSIS — Z951 Presence of aortocoronary bypass graft: Secondary | ICD-10-CM

## 2016-07-11 DIAGNOSIS — I25110 Atherosclerotic heart disease of native coronary artery with unstable angina pectoris: Secondary | ICD-10-CM | POA: Diagnosis not present

## 2016-07-11 DIAGNOSIS — J90 Pleural effusion, not elsewhere classified: Secondary | ICD-10-CM | POA: Diagnosis not present

## 2016-07-11 LAB — HEPATIC FUNCTION PANEL
ALK PHOS: 56 U/L (ref 38–126)
ALT: 69 U/L — AB (ref 14–54)
AST: 68 U/L — AB (ref 15–41)
Albumin: 4.8 g/dL (ref 3.5–5.0)
Total Bilirubin: 1.2 mg/dL (ref 0.3–1.2)
Total Protein: 8.7 g/dL — ABNORMAL HIGH (ref 6.5–8.1)

## 2016-07-11 LAB — CBC
HCT: 39.6 % (ref 36.0–46.0)
Hemoglobin: 14 g/dL (ref 12.0–15.0)
MCH: 29 pg (ref 26.0–34.0)
MCHC: 35.4 g/dL (ref 30.0–36.0)
MCV: 82 fL (ref 78.0–100.0)
Platelets: 238 10*3/uL (ref 150–400)
RBC: 4.83 MIL/uL (ref 3.87–5.11)
RDW: 13.1 % (ref 11.5–15.5)
WBC: 8.6 10*3/uL (ref 4.0–10.5)

## 2016-07-11 LAB — BASIC METABOLIC PANEL
Anion gap: 8 (ref 5–15)
BUN: 16 mg/dL (ref 6–20)
CO2: 23 mmol/L (ref 22–32)
CREATININE: 0.64 mg/dL (ref 0.44–1.00)
Calcium: 9.6 mg/dL (ref 8.9–10.3)
Chloride: 106 mmol/L (ref 101–111)
GFR calc Af Amer: >60 mL/min (ref >60)
GLUCOSE: 165 mg/dL — AB (ref 65–99)
Potassium: 3.5 mmol/L (ref 3.5–5.1)
Sodium: 137 mmol/L (ref 135–145)

## 2016-07-11 LAB — APTT: aPTT: 31 seconds (ref 24–36)

## 2016-07-11 LAB — PROTIME-INR
INR: 0.93
Prothrombin Time: 12.5 seconds (ref 11.4–15.2)

## 2016-07-11 LAB — LIPASE, BLOOD: Lipase: 49 U/L (ref 11–51)

## 2016-07-11 LAB — I-STAT TROPONIN, ED: TROPONIN I, POC: 5.47 ng/mL — AB (ref 0.00–0.08)

## 2016-07-11 LAB — TROPONIN I: Troponin I: 4.53 ng/mL (ref <0.03)

## 2016-07-11 MED ORDER — ATORVASTATIN CALCIUM 80 MG PO TABS
80.0000 mg | ORAL_TABLET | Freq: Every day | ORAL | Status: DC
  Administered 2016-07-13 – 2016-07-16 (×4): 80 mg via ORAL
  Filled 2016-07-11 (×4): qty 1

## 2016-07-11 MED ORDER — METOPROLOL TARTRATE 12.5 MG HALF TABLET
12.5000 mg | ORAL_TABLET | Freq: Two times a day (BID) | ORAL | Status: DC
  Administered 2016-07-11: 12.5 mg via ORAL
  Filled 2016-07-11: qty 1

## 2016-07-11 MED ORDER — NITROGLYCERIN 2 % TD OINT
1.0000 [in_us] | TOPICAL_OINTMENT | Freq: Once | TRANSDERMAL | Status: AC
  Administered 2016-07-11: 1 [in_us] via TOPICAL
  Filled 2016-07-11: qty 1

## 2016-07-11 MED ORDER — ASPIRIN EC 81 MG PO TBEC: 81.0000 mg | DELAYED_RELEASE_TABLET | Freq: Every day | ORAL | Status: DC

## 2016-07-11 MED ORDER — ACETAMINOPHEN 325 MG PO TABS: 650.0000 mg | ORAL_TABLET | ORAL | Status: DC | PRN

## 2016-07-11 MED ORDER — SODIUM CHLORIDE 0.9 % IV SOLN
INTRAVENOUS | Status: AC
  Administered 2016-07-11: 23:00:00 via INTRAVENOUS

## 2016-07-11 MED ORDER — ONDANSETRON HCL 4 MG/2ML IJ SOLN
4.0000 mg | Freq: Four times a day (QID) | INTRAMUSCULAR | Status: DC | PRN
  Administered 2016-07-12 (×2): 4 mg via INTRAVENOUS
  Filled 2016-07-11 (×3): qty 2

## 2016-07-11 MED ORDER — HEPARIN BOLUS VIA INFUSION
3500.0000 [IU] | Freq: Once | INTRAVENOUS | Status: AC
  Administered 2016-07-11: 3500 [IU] via INTRAVENOUS
  Filled 2016-07-11: qty 3500

## 2016-07-11 MED ORDER — ASPIRIN 81 MG PO CHEW
324.0000 mg | CHEWABLE_TABLET | Freq: Once | ORAL | Status: AC
  Administered 2016-07-11: 324 mg via ORAL
  Filled 2016-07-11: qty 4

## 2016-07-11 MED ORDER — HEPARIN (PORCINE) IN NACL 100-0.45 UNIT/ML-% IJ SOLN
950.0000 [IU]/h | INTRAMUSCULAR | Status: DC
  Administered 2016-07-11: 700 [IU]/h via INTRAVENOUS
  Filled 2016-07-11: qty 250

## 2016-07-11 MED ORDER — NITROGLYCERIN 0.4 MG SL SUBL: 0.4000 mg | SUBLINGUAL_TABLET | SUBLINGUAL | Status: DC | PRN

## 2016-07-11 NOTE — ED Triage Notes (Signed)
Pt and family member reports pt started to have L side cp a week ago, radiating to her L neck and back.  Pt describes pain as tight and pressure, with SOB when laying down.  Pt reports nausea which started yesterday.  Pt's family member reports last night pain was unbearable and pt was not able to sleep d/t pain.  Pt is A&O x 4.

## 2016-07-11 NOTE — ED Provider Notes (Signed)
[facility] DEPT Provider Note   CSN: [id number] Arrival date & time: 07/11/16  1947     History   Chief Complaint Chief Complaint  Patient presents with  . Chest Pain    HPI [name] is a 46 y.o. female.  HPI Patient states she started to get left-sided chest pain almost one week ago. She noticed it while she was walking and she thought it was pressure of a bag that she was caring across her chest. She reports it got pretty severe and she had to rest and it improved. Since that time, she has been getting chest pain with exertion. She is also experiencing pain that radiates up the left side of her neck and into her left arm. She has continued to go to work and worked today. The pain got severe again today. Yesterday during the night the pain was very bad and she had difficulty sleeping. At this time, she reports that it is again improved with rest she still perceives some discomfort on the far left side of her chest.  Patient has no prior cardiac history. Family history is unknown she reports she is adopted. She does have a history of hypercholesterolemia. Patient had a bilateral inguinal hernia repair approximately 2 months ago. She reports that has healed at this point. She is not expressing lower extremity or calf pain. Past Medical History:  Diagnosis Date  . Anemia    due to heavy periods  . Anxiety   . Difficult intubation 01/22/2016  . Headache   . Heart murmur    aschild antibiotics if having Dental work  . Hypocholesteremia    medical treatment  . Menorrhagia   . Pelvic pain     Patient Active Problem List   Diagnosis Date Noted  . NSTEMI (non-ST elevated myocardial infarction) ([place]) 07/11/2016    Past Surgical History:  Procedure Laterality Date  . ABDOMINAL HYSTERECTOMY    . goiter     left side goiter removed  . INGUINAL HERNIA REPAIR Bilateral 01/22/2016   Procedure: LAPAROSCOPIC BILATERAL FEMORAL AND RIGHT INGUINAL HERNIA REPAIRWITH INSERTION OF  MESH;  Surgeon: Michael Boston, MD;  Location: [facility] ORS;  Service: General;  Laterality: Bilateral;  . SVD     x 1  . WISDOM TOOTH EXTRACTION      OB History    No data available       Home Medications    Prior to Admission medications   Medication Sig Start Date End Date Taking? Authorizing Provider  baclofen (LIORESAL) 10 MG tablet Take 10 mg by mouth 2 (two) times daily as needed. For headaches. Limit to two days per week. 12/06/15   Historical Provider, MD  SUMAtriptan (IMITREX) 100 MG tablet Take 100 mg by mouth as directed. As needed for migraine. May repeat in 2 hours. 12/06/15   Historical Provider, MD  traMADol (ULTRAM) 50 MG tablet Take 1-2 tablets (50-100 mg total) by mouth every 6 (six) hours as needed for moderate pain or severe pain. 01/22/16   Michael Boston, MD  zonisamide (ZONEGRAN) 100 MG capsule Take 100 mg by mouth at bedtime.    Historical Provider, MD    Family History No family history on file.  Social History Social History  Substance Use Topics  . Smoking status: Never Smoker  . Smokeless tobacco: Never Used  . Alcohol use No     Allergies   Acetaminophen and Bactrim   Review of Systems Review of Systems 10 Systems reviewed and are negative  for acute change except as noted in the HPI.   Physical Exam Updated Vital Signs BP 144/92   Pulse 93   Resp 20   Ht 5' (1.524 m)   Wt 130 lb (59 kg)   LMP 08/10/2011   SpO2 98%   BMI 25.39 kg/m   Physical Exam  Constitutional: She is oriented to person, place, and time. She appears well-developed and well-nourished. No distress.  HENT:  Head: Normocephalic and atraumatic.  Mouth/Throat: Oropharynx is clear and moist.  Eyes: Conjunctivae and EOM are normal.  Neck: Neck supple.  Cardiovascular: Normal rate and regular rhythm.   No murmur heard. Pulmonary/Chest: Effort normal and breath sounds normal. No respiratory distress.  Abdominal: Soft. There is no tenderness.  Musculoskeletal: She exhibits no  edema.  Neurological: She is alert and oriented to person, place, and time. She exhibits normal muscle tone. Coordination normal.  Patient has slight mouth asymmetry with the right side with some droop. Patient and husband report this is a chronic condition and not recent.  Skin: Skin is warm and dry.  Psychiatric: She has a normal mood and affect.  Nursing note and vitals reviewed.    ED Treatments / Results  Labs (all labs ordered are listed, but only abnormal results are displayed) Labs Reviewed  I-STAT TROPOININ, ED - Abnormal; Notable for the following:       Result Value   Troponin i, poc 5.47 (*)    All other components within normal limits  CBC  APTT  BASIC METABOLIC PANEL  TROPONIN I  LIPASE, BLOOD  HEPATIC FUNCTION PANEL  PROTIME-INR    EKG  EKG Interpretation  Date/Time:  Friday July 11 2016 19:54:30 EST Ventricular Rate:  101 PR Interval:    QRS Duration: 71 QT Interval:  378 QTC Calculation: 490 R Axis:   35 Text Interpretation:  Sinus tachycardia Anterior infarct, age indeterminate Baseline wander in lead(s) I III aVL anterior lateral ischemia Confirmed by Johnney Killian, MD, Jeannie Done [phone]) on 07/11/2016 8:44:24 PM       Radiology Dg Chest 2 View  Result Date: 07/11/2016 CLINICAL DATA:  Left upper quadrant chest pain extending into the left axillary region, shoulder and neck for 2 weeks. Worsening symptoms today. EXAM: CHEST  2 VIEW COMPARISON:  Portable chest 04/05/2008. FINDINGS: The heart size and mediastinal contours are normal. The lungs are clear. There is no pleural effusion or pneumothorax. No acute osseous findings are identified. Mild thoracic spine degenerative changes are noted. Telemetry leads overlie the chest. There are surgical clips in the right paratracheal region. IMPRESSION: No active cardiopulmonary process. Electronically Signed   By: Richardean Sale M.D.   On: 07/11/2016 20:18    Procedures Procedures (including critical care  time) CRITICAL CARE Performed by: Charlesetta Shanks   Total critical care time: 30 minutes  Critical care time was exclusive of separately billable procedures and treating other patients.  Critical care was necessary to treat or prevent imminent or life-threatening deterioration.  Critical care was time spent personally by me on the following activities: development of treatment plan with patient and/or surrogate as well as nursing, discussions with consultants, evaluation of patient's response to treatment, examination of patient, obtaining history from patient or surrogate, ordering and performing treatments and interventions, ordering and review of laboratory studies, ordering and review of radiographic studies, pulse oximetry and re-evaluation of patient's condition. Medications Ordered in ED Medications  heparin bolus via infusion 3,500 Units (not administered)    Followed by  heparin ADULT  infusion 100 units/mL (25000 units/265mL sodium chloride 0.45%) (not administered)  aspirin chewable tablet 324 mg (324 mg Oral Given 07/11/16 2107)     Initial Impression / Assessment and Plan / ED Course  I have reviewed the triage vital signs and the nursing notes.  Pertinent labs & imaging results that were available during my care of the patient were reviewed by me and considered in my medical decision making (see chart for details).     Consult: (21:05) discussed with Dr.Fujim (night fellow). Will initiate aspirin and heparin and transferred to [facility] for ongoing cardiology therapy.  21:50 patient reports pain is resolved after initiation of heparin and aspirin. Will add Nitropaste topically. Final Clinical Impressions(s) / ED Diagnoses   Final diagnoses:  NSTEMI (non-ST elevated myocardial infarction) [facility])   Patient presents with 1 week of exertional chest pain with radiation to the arm and shoulder. EKG shows ischemic changes with anterior and lateral T-wave inversions.  Troponin is elevated. Findings are consistent with an STEMI. Patient is alert and appropriate. She does not have any respiratory distress. Skin is warm and dry. She is transferred in stable condition to [facility]   No medications on file     Charlesetta Shanks, MD 07/11/16 2205

## 2016-07-11 NOTE — ED Notes (Signed)
Pt has been complaining of chest pain for one week especially during walking and exertion.

## 2016-07-11 NOTE — Progress Notes (Signed)
ANTICOAGULATION CONSULT NOTE - Initial Consult  Pharmacy Consult for IV heparin Indication: chest pain/ACS  Allergies  Allergen Reactions  . Acetaminophen Other (See Comments)    Causes headaches   . Bactrim Itching and Rash         Patient Measurements: Height: 5' (152.4 cm) Weight: 130 lb (59 kg) IBW/kg (Calculated) : 45.5 Heparin Dosing Weight: 57.5 kg  Vital Signs: BP: 144/92 (02/16 2100) Pulse Rate: 93 (02/16 2100)  Labs:  Recent Labs  07/11/16 2045  HGB 14.0  HCT 39.6  PLT 238  APTT 31  LABPROT 12.5  INR 0.93  CREATININE 0.64  TROPONINI 4.53*    Estimated Creatinine Clearance: 69.1 mL/min (by C-G formula based on SCr of 0.64 mg/dL).   Medical History: Past Medical History:  Diagnosis Date  . Anemia    due to heavy periods  . Anxiety   . Difficult intubation 01/22/2016  . Headache   . Heart murmur    aschild antibiotics if having Dental work  . Hypocholesteremia    medical treatment  . Menorrhagia   . Pelvic pain     Assessment: 46 y/oF who reports having left sided chest pain a week ago, radiating to neck and back. She began having nausea yesterday. Troponin elevated in ED. Pharmacy consulted to dose IV heparin infusion for ACS. Patient not on any antocoagulants PTA. Baseline aPTT, PT/INR, CBC WNL.  Goal of Therapy:  Heparin level 0.3-0.7 units/ml Monitor platelets by anticoagulation protocol: Yes   Plan:  Heparin 3500 units IV bolus x 1, then start heparin infusion at 700 units/hr. Heparin level 6 hours after infusion started Daily heparin level and CBC while on heparin infusion. Monitor closely for s/sx of bleeding.    Lindell Spar, PharmD, BCPS Pager: [phone] 07/11/2016 9:03 PM

## 2016-07-11 NOTE — ED Notes (Signed)
Heparin verified with Anderson Malta, RN

## 2016-07-11 NOTE — H&P (Signed)
History & Physical    Patient ID: [name] MRN: [id number], DOB/AGE: [birth year]   Admit date: 07/11/2016   Primary Physician: Melinda Crutch, MD Primary Cardiologist: None  Patient Profile    46 y o woman with CP and nstemi  Past Medical History    Past Medical History:  Diagnosis Date  . Anemia    due to heavy periods  . Anxiety   . Difficult intubation 01/22/2016  . Headache   . Heart murmur    aschild antibiotics if having Dental work  . Hypocholesteremia    medical treatment  . Menorrhagia   . Pelvic pain     Past Surgical History:  Procedure Laterality Date  . ABDOMINAL HYSTERECTOMY    . goiter     left side goiter removed  . INGUINAL HERNIA REPAIR Bilateral 01/22/2016   Procedure: LAPAROSCOPIC BILATERAL FEMORAL AND RIGHT INGUINAL HERNIA REPAIRWITH INSERTION OF MESH;  Surgeon: Michael Boston, MD;  Location: [facility];  Service: General;  Laterality: Bilateral;  . SVD     x 1  . WISDOM TOOTH EXTRACTION       Allergies  Allergies  Allergen Reactions  . Acetaminophen Other (See Comments)    Causes headaches   . Bactrim Itching and Rash         History of Present Illness    [name] is an adopted 46 y o woman with no sign PMH. She presents to [facility] with 1 week history of CP with mild activity. Radiating to left arm. She continued to go to work and was able to tolerate the pain. Then yesterday pain exacerbated and started to occur at rest. Pain was radiating to right arm, and back. She continued to have pain this am and went to the ED. In the ED she was found to have ST changes suggestive of ischemia and pain at rest and a troponin elevation. I was called for a consult.   She reports no new stressors. No smoking or ethanol use. She takes no cardiac meds. At [facility] she got ASA and nitro patch which relived her pain. Currently she reports only some mild back pain which she is not sure is related to her heart. She blames the bed.   Home Medications    Prior to  Admission medications   Medication Sig Start Date End Date Taking? Authorizing Provider  amitriptyline (ELAVIL) 50 MG tablet Take 25 mg by mouth at bedtime. 06/16/16  Yes Historical Provider, MD  APPLE CIDER VINEGAR PO Take 2 tablets by mouth daily.   Yes Historical Provider, MD  baclofen (LIORESAL) 10 MG tablet Take 10 mg by mouth 2 (two) times daily as needed (headache). Limit to two days per week. 12/06/15  Yes Historical Provider, MD  LORazepam (ATIVAN) 0.5 MG tablet Take 0.5 mg by mouth at bedtime. And one tablet for anxiety as needed. Do not exceed 2 tablets.   Yes Historical Provider, MD  naproxen (NAPROSYN) 500 MG tablet Take 500 mg by mouth 2 (two) times daily.   Yes Historical Provider, MD  SUMAtriptan (IMITREX) 100 MG tablet Take 100 mg by mouth as directed. As needed for migraine. May repeat in 2 hours. 12/06/15  Yes Historical Provider, MD  zonisamide (ZONEGRAN) 100 MG capsule Take 100 mg by mouth at bedtime.   Yes Historical Provider, MD  traMADol (ULTRAM) 50 MG tablet Take 1-2 tablets (50-100 mg total) by mouth every 6 (six) hours as needed for moderate pain or severe pain. Patient not taking:  Reported on 07/11/2016 01/22/16   Michael Boston, MD    Family History    Unknown since adopted  Social History    Social History   Social History  . Marital status: Married    Spouse name: N/A  . Number of children: N/A  . Years of education: N/A   Occupational History  . Not on file.   Social History Main Topics  . Smoking status: Never Smoker  . Smokeless tobacco: Never Used  . Alcohol use No  . Drug use: No  . Sexual activity: Yes    Birth control/ protection: None   Other Topics Concern  . Not on file   Social History Narrative  . No narrative on file     Review of Systems    General:  No chills, fever, night sweats or weight changes.  Cardiovascular:  No chest pain, dyspnea on exertion, edema, orthopnea, palpitations, paroxysmal nocturnal dyspnea. Dermatological:  No rash, lesions/masses Respiratory: No cough, dyspnea Urologic: No hematuria, dysuria Abdominal:   No nausea, vomiting, diarrhea, bright red blood per rectum, melena, or hematemesis Neurologic:  No visual changes, wkns, changes in mental status. All other systems reviewed and are otherwise negative except as noted above.  Physical Exam    Blood pressure (!) 144/96, pulse 87, temperature 98.2 F (36.8 C), resp. rate 16, height 4\' 11"  (1.499 m), weight 57 kg (125 lb 11.2 oz), last menstrual period 08/10/2011, SpO2 95 %.  General: Pleasant, NAD Psych: Normal affect. Neuro: Alert and oriented X 3. Moves all extremities spontaneously. HEENT: Normal  Neck: Supple without bruits or JVD. Lungs:  Resp regular and unlabored, CTA. Heart: RRR no s3, s4, or murmurs. Abdomen: Soft, non-tender, non-distended, BS + x 4.  Extremities: No clubbing, cyanosis or edema. DP/PT/Radials 2+ and equal bilaterally.  Labs    Troponin [facility] of Care Test)  Recent Labs  07/11/16 2025  TROPIPOC 5.47*    Recent Labs  07/11/16 2045  TROPONINI 4.53*   Lab Results  Component Value Date   WBC 8.6 07/11/2016   HGB 14.0 07/11/2016   HCT 39.6 07/11/2016   MCV 82.0 07/11/2016   PLT 238 07/11/2016    Recent Labs Lab 07/11/16 2045  NA 137  K 3.5  CL 106  CO2 23  BUN 16  CREATININE 0.64  CALCIUM 9.6  PROT 8.7*  BILITOT 1.2  ALKPHOS 56  ALT 69*  AST 68*  GLUCOSE 165*   No results found for: CHOL, HDL, LDLCALC, TRIG No results found for: [facility]   Radiology Studies    Dg Chest 2 View  Result Date: 07/11/2016 CLINICAL DATA:  Left upper quadrant chest pain extending into the left axillary region, shoulder and neck for 2 weeks. Worsening symptoms today. EXAM: CHEST  2 VIEW COMPARISON:  Portable chest 04/05/2008. FINDINGS: The heart size and mediastinal contours are normal. The lungs are clear. There is no pleural effusion or pneumothorax. No acute osseous findings are identified. Mild thoracic  spine degenerative changes are noted. Telemetry leads overlie the chest. There are surgical clips in the right paratracheal region. IMPRESSION: No active cardiopulmonary process. Electronically Signed   By: Richardean Sale M.D.   On: 07/11/2016 20:18    ECG & Cardiac Imaging    NSR with t wave inversions in antero lateral views. Wellens T waves. No ST elevations  Assessment & Plan    Rachel Adolphe has no cardiac PMH. Now with NSTEMI. Ischemic changes on ECG. Anginal pain has resolved. Will continue  on anti anginal therapy and start on heparin and ASA. Will plan to take to cath alb if her CP comes back overnight otherwise plan for LHC over weekend. Likely needs to be done within 24-48h and should not wait till weekday.  Plan: - Heparin, nitro paste - ASA in am - ECG in am - Started on Atorva 80 - TFT, Lipid panel in am  Signed, Cristina Gong, MD 07/11/2016, 11:29 PM

## 2016-07-11 NOTE — ED Notes (Signed)
Triage RN made aware of critical troponin, pt taken back to [facility] A.

## 2016-07-11 NOTE — ED Notes (Signed)
2 Attempts to Call report

## 2016-07-12 ENCOUNTER — Encounter (HOSPITAL_COMMUNITY)
Admission: EM | Disposition: A | Discharge status: Home / Self Care | Payer: Self-pay | Source: Home / Self Care | Attending: Surgery

## 2016-07-12 ENCOUNTER — Inpatient Hospital Stay (HOSPITAL_COMMUNITY): Payer: Federal, State, Local not specified - PPO

## 2016-07-12 ENCOUNTER — Inpatient Hospital Stay (HOSPITAL_COMMUNITY): Payer: Federal, State, Local not specified - PPO | Admitting: Anesthesiology

## 2016-07-12 ENCOUNTER — Encounter (HOSPITAL_COMMUNITY): Payer: Self-pay | Admitting: Certified Registered Nurse Anesthetist

## 2016-07-12 DIAGNOSIS — I214 Non-ST elevation (NSTEMI) myocardial infarction: Principal | ICD-10-CM

## 2016-07-12 DIAGNOSIS — Z951 Presence of aortocoronary bypass graft: Secondary | ICD-10-CM

## 2016-07-12 DIAGNOSIS — I25110 Atherosclerotic heart disease of native coronary artery with unstable angina pectoris: Secondary | ICD-10-CM

## 2016-07-12 DIAGNOSIS — I2510 Atherosclerotic heart disease of native coronary artery without angina pectoris: Secondary | ICD-10-CM

## 2016-07-12 HISTORY — PX: CORONARY ARTERY BYPASS GRAFT: SHX141

## 2016-07-12 HISTORY — PX: LEFT HEART CATH AND CORONARY ANGIOGRAPHY: CATH118249

## 2016-07-12 HISTORY — DX: Non-ST elevation (NSTEMI) myocardial infarction: I21.4

## 2016-07-12 LAB — BASIC METABOLIC PANEL
Anion gap: 10 (ref 5–15)
BUN: 11 mg/dL (ref 6–20)
CHLORIDE: 106 mmol/L (ref 101–111)
CO2: 23 mmol/L (ref 22–32)
Calcium: 9.2 mg/dL (ref 8.9–10.3)
Creatinine, Ser: 0.64 mg/dL (ref 0.44–1.00)
GFR calc non Af Amer: >60 mL/min (ref >60)
Glucose, Bld: 146 mg/dL — ABNORMAL HIGH (ref 65–99)
POTASSIUM: 3.6 mmol/L (ref 3.5–5.1)
Sodium: 139 mmol/L (ref 135–145)

## 2016-07-12 LAB — CBC
HCT: 29 % — ABNORMAL LOW (ref 36.0–46.0)
HEMATOCRIT: 27.9 % — AB (ref 36.0–46.0)
HEMOGLOBIN: 9.6 g/dL — AB (ref 12.0–15.0)
HEMOGLOBIN: 9.9 g/dL — AB (ref 12.0–15.0)
MCH: 29.2 pg (ref 26.0–34.0)
MCH: 28.9 pg (ref 26.0–34.0)
MCHC: 34.4 g/dL (ref 30.0–36.0)
MCHC: 34.1 g/dL (ref 30.0–36.0)
MCV: 84.8 fL (ref 78.0–100.0)
MCV: 84.8 fL (ref 78.0–100.0)
PLATELETS: 190 10*3/uL (ref 150–400)
Platelets: 156 10*3/uL (ref 150–400)
RBC: 3.29 MIL/uL — AB (ref 3.87–5.11)
RBC: 3.42 MIL/uL — AB (ref 3.87–5.11)
RDW: 13.2 % (ref 11.5–15.5)
RDW: 13.3 % (ref 11.5–15.5)
WBC: 11.5 10*3/uL — ABNORMAL HIGH (ref 4.0–10.5)
WBC: 16.2 10*3/uL — AB (ref 4.0–10.5)

## 2016-07-12 LAB — GLUCOSE, CAPILLARY
GLUCOSE-CAPILLARY: 132 mg/dL — AB (ref 65–99)
GLUCOSE-CAPILLARY: 107 mg/dL — AB (ref 65–99)
GLUCOSE-CAPILLARY: 146 mg/dL — AB (ref 65–99)
GLUCOSE-CAPILLARY: 151 mg/dL — AB (ref 65–99)
GLUCOSE-CAPILLARY: 114 mg/dL — AB (ref 65–99)
Glucose-Capillary: 114 mg/dL — ABNORMAL HIGH (ref 65–99)
Glucose-Capillary: 135 mg/dL — ABNORMAL HIGH (ref 65–99)

## 2016-07-12 LAB — POCT I-STAT, CHEM 8
BUN: 8 mg/dL (ref 6–20)
CALCIUM ION: 1.1 mmol/L — AB (ref 1.15–1.40)
CHLORIDE: 111 mmol/L (ref 101–111)
Creatinine, Ser: 0.4 mg/dL — ABNORMAL LOW (ref 0.44–1.00)
Glucose, Bld: 152 mg/dL — ABNORMAL HIGH (ref 65–99)
HEMATOCRIT: 27 % — AB (ref 36.0–46.0)
Hemoglobin: 9.2 g/dL — ABNORMAL LOW (ref 12.0–15.0)
Potassium: 3.2 mmol/L — ABNORMAL LOW (ref 3.5–5.1)
SODIUM: 145 mmol/L (ref 135–145)
TCO2: 22 mmol/L (ref 0–100)

## 2016-07-12 LAB — POCT I-STAT 3, ART BLOOD GAS (G3+)
ACID-BASE DEFICIT: 4 mmol/L — AB (ref 0.0–2.0)
ACID-BASE DEFICIT: 6 mmol/L — AB (ref 0.0–2.0)
Acid-base deficit: 3 mmol/L — ABNORMAL HIGH (ref 0.0–2.0)
BICARBONATE: 20.3 mmol/L (ref 20.0–28.0)
Bicarbonate: 19.4 mmol/L — ABNORMAL LOW (ref 20.0–28.0)
Bicarbonate: 21.6 mmol/L (ref 20.0–28.0)
O2 SAT: 99 %
O2 SAT: 99 %
O2 Saturation: 98 %
PCO2 ART: 31.9 mmHg — AB (ref 32.0–48.0)
PO2 ART: 130 mmHg — AB (ref 83.0–108.0)
Patient temperature: 36.9
Patient temperature: 37.8
TCO2: 21 mmol/L (ref 0–100)
TCO2: 21 mmol/L (ref 0–100)
TCO2: 23 mmol/L (ref 0–100)
pCO2 arterial: 38.8 mmHg (ref 32.0–48.0)
pCO2 arterial: 37.2 mmHg (ref 32.0–48.0)
pH, Arterial: 7.411 (ref 7.350–7.450)
pH, Arterial: 7.311 — ABNORMAL LOW (ref 7.350–7.450)
pH, Arterial: 7.376 (ref 7.350–7.450)
pO2, Arterial: 130 mmHg — ABNORMAL HIGH (ref 83.0–108.0)
pO2, Arterial: 112 mmHg — ABNORMAL HIGH (ref 83.0–108.0)

## 2016-07-12 LAB — POCT I-STAT 4, (NA,K, GLUC, HGB,HCT)
Glucose, Bld: 144 mg/dL — ABNORMAL HIGH (ref 65–99)
HEMATOCRIT: 25 % — AB (ref 36.0–46.0)
Hemoglobin: 8.5 g/dL — ABNORMAL LOW (ref 12.0–15.0)
Potassium: 3.1 mmol/L — ABNORMAL LOW (ref 3.5–5.1)
SODIUM: 143 mmol/L (ref 135–145)

## 2016-07-12 LAB — APTT: APTT: 30 s (ref 24–36)

## 2016-07-12 LAB — ABO/RH: ABO/RH(D): A POS

## 2016-07-12 LAB — CREATININE, SERUM
CREATININE: 0.56 mg/dL (ref 0.44–1.00)
GFR calc Af Amer: >60 mL/min (ref >60)

## 2016-07-12 LAB — MAGNESIUM: MAGNESIUM: 2.9 mg/dL — AB (ref 1.7–2.4)

## 2016-07-12 LAB — TYPE AND SCREEN
ABO/RH(D): A POS
ANTIBODY SCREEN: NEGATIVE

## 2016-07-12 LAB — ECHO INTRAOPERATIVE TEE
HEIGHTINCHES: 59 in
WEIGHTICAEL: 2044.8 [oz_av]

## 2016-07-12 LAB — MRSA PCR SCREENING: MRSA BY PCR: NEGATIVE

## 2016-07-12 LAB — BRAIN NATRIURETIC PEPTIDE: B Natriuretic Peptide: 234.8 pg/mL — ABNORMAL HIGH (ref 0.0–100.0)

## 2016-07-12 LAB — HEPARIN LEVEL (UNFRACTIONATED): HEPARIN UNFRACTIONATED: 0.14 [IU]/mL — AB (ref 0.30–0.70)

## 2016-07-12 LAB — TSH: TSH: 1.146 u[IU]/mL (ref 0.350–4.500)

## 2016-07-12 LAB — PLATELET COUNT: Platelets: 120 10*3/uL — ABNORMAL LOW (ref 150–400)

## 2016-07-12 LAB — PROTIME-INR
INR: 1.21
PROTHROMBIN TIME: 15.4 s — AB (ref 11.4–15.2)

## 2016-07-12 LAB — HEMOGLOBIN AND HEMATOCRIT, BLOOD
HEMATOCRIT: 19.1 % — AB (ref 36.0–46.0)
HEMOGLOBIN: 6.6 g/dL — AB (ref 12.0–15.0)

## 2016-07-12 LAB — T4, FREE: Free T4: 0.9 ng/dL (ref 0.61–1.12)

## 2016-07-12 LAB — TROPONIN I: TROPONIN I: 7.45 ng/mL — AB (ref <0.03)

## 2016-07-12 SURGERY — LEFT HEART CATH AND CORONARY ANGIOGRAPHY
Anesthesia: LOCAL

## 2016-07-12 SURGERY — CORONARY ARTERY BYPASS GRAFTING (CABG)
Anesthesia: General | Site: Chest

## 2016-07-12 MED ORDER — DEXTROSE 5 % IV SOLN
1.5000 g | INTRAVENOUS | Status: DC
  Filled 2016-07-12: qty 1.5

## 2016-07-12 MED ORDER — ALBUMIN HUMAN 5 % IV SOLN
INTRAVENOUS | Status: DC | PRN
  Administered 2016-07-12: 15:00:00 via INTRAVENOUS

## 2016-07-12 MED ORDER — DOPAMINE-DEXTROSE 3.2-5 MG/ML-% IV SOLN
3.0000 ug/kg/min | INTRAVENOUS | Status: DC
  Administered 2016-07-12: 3 ug/kg/min via INTRAVENOUS

## 2016-07-12 MED ORDER — MORPHINE SULFATE (PF) 2 MG/ML IV SOLN
2.0000 mg | INTRAVENOUS | Status: DC | PRN
Start: 2016-07-12 — End: 2016-07-12

## 2016-07-12 MED ORDER — THROMBIN 20000 UNITS EX SOLR
CUTANEOUS | Status: AC
  Filled 2016-07-12: qty 20000

## 2016-07-12 MED ORDER — HEPARIN SODIUM (PORCINE) 1000 UNIT/ML IJ SOLN
INTRAMUSCULAR | Status: AC
  Filled 2016-07-12: qty 1

## 2016-07-12 MED ORDER — METOPROLOL TARTRATE 12.5 MG HALF TABLET: 12.5000 mg | ORAL_TABLET | Freq: Two times a day (BID) | ORAL | Status: DC

## 2016-07-12 MED ORDER — ONDANSETRON HCL 4 MG/2ML IJ SOLN
INTRAMUSCULAR | Status: DC | PRN
  Administered 2016-07-12: 4 mg via INTRAVENOUS

## 2016-07-12 MED ORDER — ACETAMINOPHEN 325 MG PO TABS: 650.0000 mg | ORAL_TABLET | ORAL | Status: DC | PRN

## 2016-07-12 MED ORDER — BISACODYL 10 MG RE SUPP: 10.0000 mg | Freq: Every day | RECTAL | Status: DC

## 2016-07-12 MED ORDER — ASPIRIN 81 MG PO CHEW: 81.0000 mg | CHEWABLE_TABLET | Freq: Every day | ORAL | Status: DC

## 2016-07-12 MED ORDER — SODIUM CHLORIDE 0.9 % IV SOLN
30.0000 meq | Freq: Once | INTRAVENOUS | Status: AC
  Administered 2016-07-12: 30 meq via INTRAVENOUS
  Filled 2016-07-12: qty 15

## 2016-07-12 MED ORDER — FENTANYL CITRATE (PF) 250 MCG/5ML IJ SOLN
INTRAMUSCULAR | Status: DC | PRN
  Administered 2016-07-12 (×5): 250 ug via INTRAVENOUS

## 2016-07-12 MED ORDER — PLASMA-LYTE 148 IV SOLN
INTRAVENOUS | Status: DC | PRN
  Administered 2016-07-12: 500 mL via INTRAVASCULAR

## 2016-07-12 MED ORDER — SODIUM CHLORIDE 0.9 % IV SOLN: INTRAVENOUS | Status: DC

## 2016-07-12 MED ORDER — ASPIRIN EC 325 MG PO TBEC
325.0000 mg | DELAYED_RELEASE_TABLET | Freq: Every day | ORAL | Status: DC
  Administered 2016-07-13: 325 mg via ORAL
  Filled 2016-07-12 (×2): qty 1

## 2016-07-12 MED ORDER — THROMBIN 20000 UNITS EX SOLR
CUTANEOUS | Status: DC | PRN
  Administered 2016-07-12 (×2): 20000 [IU] via TOPICAL

## 2016-07-12 MED ORDER — LACTATED RINGERS IV SOLN: INTRAVENOUS | Status: DC

## 2016-07-12 MED ORDER — MORPHINE SULFATE (PF) 2 MG/ML IV SOLN: 1.0000 mg | INTRAVENOUS | Status: DC | PRN

## 2016-07-12 MED ORDER — DEXMEDETOMIDINE HCL 200 MCG/2ML IV SOLN
INTRAVENOUS | Status: DC | PRN
  Administered 2016-07-12: 0.2 ug/kg/h via INTRAVENOUS

## 2016-07-12 MED ORDER — PANTOPRAZOLE SODIUM 40 MG PO TBEC: 40.0000 mg | DELAYED_RELEASE_TABLET | Freq: Every day | ORAL | Status: DC

## 2016-07-12 MED ORDER — ONDANSETRON HCL 4 MG/2ML IJ SOLN
4.0000 mg | Freq: Four times a day (QID) | INTRAMUSCULAR | Status: DC | PRN
  Administered 2016-07-13: 4 mg via INTRAVENOUS

## 2016-07-12 MED ORDER — SODIUM CHLORIDE 0.9 % IV SOLN
1000.0000 mg | Freq: Once | INTRAVENOUS | Status: DC
  Filled 2016-07-12: qty 10

## 2016-07-12 MED ORDER — FAMOTIDINE IN NACL 20-0.9 MG/50ML-% IV SOLN
20.0000 mg | Freq: Two times a day (BID) | INTRAVENOUS | Status: DC
  Administered 2016-07-12: 20 mg via INTRAVENOUS

## 2016-07-12 MED ORDER — ASPIRIN 81 MG PO CHEW: 81.0000 mg | CHEWABLE_TABLET | ORAL | Status: DC

## 2016-07-12 MED ORDER — HEPARIN SODIUM (PORCINE) 1000 UNIT/ML IJ SOLN
INTRAMUSCULAR | Status: DC | PRN
  Administered 2016-07-12: 3000 [IU] via INTRAVENOUS

## 2016-07-12 MED ORDER — SODIUM CHLORIDE 0.9 % IV SOLN: 250.0000 mL | INTRAVENOUS | Status: DC | PRN

## 2016-07-12 MED ORDER — ALBUMIN HUMAN 5 % IV SOLN
250.0000 mL | INTRAVENOUS | Status: AC | PRN
  Administered 2016-07-12: 250 mL via INTRAVENOUS

## 2016-07-12 MED ORDER — SODIUM CHLORIDE 0.9% FLUSH
3.0000 mL | Freq: Two times a day (BID) | INTRAVENOUS | Status: DC
  Administered 2016-07-13: 3 mL via INTRAVENOUS

## 2016-07-12 MED ORDER — MIDAZOLAM HCL 2 MG/2ML IJ SOLN
INTRAMUSCULAR | Status: AC
  Filled 2016-07-12: qty 2

## 2016-07-12 MED ORDER — MIDAZOLAM HCL 2 MG/2ML IJ SOLN: 2.0000 mg | INTRAMUSCULAR | Status: DC | PRN

## 2016-07-12 MED ORDER — NITROGLYCERIN IN D5W 200-5 MCG/ML-% IV SOLN: 5.0000 ug/min | INTRAVENOUS | Status: DC

## 2016-07-12 MED ORDER — SODIUM CHLORIDE 0.9 % WEIGHT BASED INFUSION: 1.0000 mL/kg/h | INTRAVENOUS | Status: DC

## 2016-07-12 MED ORDER — PROPOFOL 10 MG/ML IV BOLUS
INTRAVENOUS | Status: DC | PRN
  Administered 2016-07-12: 40 mg via INTRAVENOUS

## 2016-07-12 MED ORDER — MAGNESIUM SULFATE 4 GM/100ML IV SOLN
4.0000 g | Freq: Once | INTRAVENOUS | Status: AC
  Administered 2016-07-12: 4 g via INTRAVENOUS
  Filled 2016-07-12: qty 100

## 2016-07-12 MED ORDER — SODIUM CHLORIDE 0.9 % IV SOLN
INTRAVENOUS | Status: DC
  Filled 2016-07-12: qty 30

## 2016-07-12 MED ORDER — SODIUM CHLORIDE 0.9% FLUSH: 3.0000 mL | Freq: Two times a day (BID) | INTRAVENOUS | Status: DC

## 2016-07-12 MED ORDER — LACTATED RINGERS IV SOLN: 500.0000 mL | Freq: Once | INTRAVENOUS | Status: DC | PRN

## 2016-07-12 MED ORDER — NITROGLYCERIN IN D5W 200-5 MCG/ML-% IV SOLN: 0.0000 ug/min | INTRAVENOUS | Status: DC

## 2016-07-12 MED ORDER — LACTATED RINGERS IV SOLN
INTRAVENOUS | Status: DC | PRN
  Administered 2016-07-12: 13:00:00 via INTRAVENOUS

## 2016-07-12 MED ORDER — VANCOMYCIN HCL 10 G IV SOLR
1250.0000 mg | INTRAVENOUS | Status: AC
  Administered 2016-07-12: 1250 mg via INTRAVENOUS
  Filled 2016-07-12: qty 1250

## 2016-07-12 MED ORDER — HEPARIN (PORCINE) IN NACL 2-0.9 UNIT/ML-% IJ SOLN
INTRAMUSCULAR | Status: DC | PRN
  Administered 2016-07-12: 1500 mL

## 2016-07-12 MED ORDER — METOPROLOL TARTRATE 25 MG/10 ML ORAL SUSPENSION: 12.5000 mg | Freq: Two times a day (BID) | ORAL | Status: DC

## 2016-07-12 MED ORDER — MIDAZOLAM HCL 5 MG/5ML IJ SOLN
INTRAMUSCULAR | Status: DC | PRN
  Administered 2016-07-12: 4 mg via INTRAVENOUS

## 2016-07-12 MED ORDER — EPINEPHRINE PF 1 MG/ML IJ SOLN
0.0000 ug/min | INTRAVENOUS | Status: DC
  Filled 2016-07-12: qty 4

## 2016-07-12 MED ORDER — CHLORHEXIDINE GLUCONATE 0.12 % MT SOLN
15.0000 mL | OROMUCOSAL | Status: AC
  Administered 2016-07-12: 15 mL via OROMUCOSAL
  Filled 2016-07-12: qty 15

## 2016-07-12 MED ORDER — TRAMADOL HCL 50 MG PO TABS
50.0000 mg | ORAL_TABLET | ORAL | Status: DC | PRN
  Administered 2016-07-13 – 2016-07-14 (×4): 100 mg via ORAL
  Filled 2016-07-12 (×4): qty 2

## 2016-07-12 MED ORDER — SODIUM CHLORIDE 0.9% FLUSH
3.0000 mL | INTRAVENOUS | Status: DC | PRN
Start: 2016-07-13 — End: 2016-07-14

## 2016-07-12 MED ORDER — MAGNESIUM SULFATE 50 % IJ SOLN
40.0000 meq | INTRAMUSCULAR | Status: DC
  Filled 2016-07-12: qty 10

## 2016-07-12 MED ORDER — MIDAZOLAM HCL 10 MG/2ML IJ SOLN
INTRAMUSCULAR | Status: AC
  Filled 2016-07-12: qty 2

## 2016-07-12 MED ORDER — HEPARIN (PORCINE) IN NACL 2-0.9 UNIT/ML-% IJ SOLN
INTRAMUSCULAR | Status: AC
  Filled 2016-07-12: qty 1500

## 2016-07-12 MED ORDER — DEXMEDETOMIDINE HCL IN NACL 200 MCG/50ML IV SOLN: 0.0000 ug/kg/h | INTRAVENOUS | Status: DC

## 2016-07-12 MED ORDER — BISACODYL 5 MG PO TBEC
10.0000 mg | DELAYED_RELEASE_TABLET | Freq: Every day | ORAL | Status: DC
  Administered 2016-07-13: 10 mg via ORAL
  Filled 2016-07-12: qty 2

## 2016-07-12 MED ORDER — DEXMEDETOMIDINE HCL IN NACL 400 MCG/100ML IV SOLN
0.1000 ug/kg/h | INTRAVENOUS | Status: DC
  Filled 2016-07-12: qty 100

## 2016-07-12 MED ORDER — METOPROLOL TARTRATE 5 MG/5ML IV SOLN: 2.5000 mg | INTRAVENOUS | Status: DC | PRN

## 2016-07-12 MED ORDER — THROMBIN 20000 UNITS EX SOLR
OROMUCOSAL | Status: DC | PRN
  Administered 2016-07-12 (×3): 4 mL via TOPICAL

## 2016-07-12 MED ORDER — HEPARIN SODIUM (PORCINE) 1000 UNIT/ML IJ SOLN
INTRAMUSCULAR | Status: DC | PRN
  Administered 2016-07-12: 17 mL via INTRAVENOUS

## 2016-07-12 MED ORDER — SODIUM CHLORIDE 0.9% FLUSH: 3.0000 mL | INTRAVENOUS | Status: DC | PRN

## 2016-07-12 MED ORDER — LIDOCAINE HCL (CARDIAC) 20 MG/ML IV SOLN
INTRAVENOUS | Status: DC | PRN
  Administered 2016-07-12: 100 mg via INTRAVENOUS

## 2016-07-12 MED ORDER — PHENYLEPHRINE 40 MCG/ML (10ML) SYRINGE FOR IV PUSH (FOR BLOOD PRESSURE SUPPORT)
PREFILLED_SYRINGE | INTRAVENOUS | Status: AC
  Filled 2016-07-12: qty 30

## 2016-07-12 MED ORDER — ROCURONIUM BROMIDE 50 MG/5ML IV SOSY
PREFILLED_SYRINGE | INTRAVENOUS | Status: AC
  Filled 2016-07-12: qty 15

## 2016-07-12 MED ORDER — DEXTROSE 5 % IV SOLN
1.5000 g | Freq: Two times a day (BID) | INTRAVENOUS | Status: AC
  Administered 2016-07-12 – 2016-07-14 (×4): 1.5 g via INTRAVENOUS
  Filled 2016-07-12 (×4): qty 1.5

## 2016-07-12 MED ORDER — SODIUM CHLORIDE 0.9 % IV SOLN
INTRAVENOUS | Status: DC
  Administered 2016-07-12: 16:00:00 via INTRAVENOUS

## 2016-07-12 MED ORDER — FENTANYL CITRATE (PF) 250 MCG/5ML IJ SOLN
INTRAMUSCULAR | Status: AC
  Filled 2016-07-12: qty 5

## 2016-07-12 MED ORDER — TRANEXAMIC ACID 1000 MG/10ML IV SOLN
1.5000 mg/kg/h | INTRAVENOUS | Status: DC
  Filled 2016-07-12: qty 25

## 2016-07-12 MED ORDER — PHENYLEPHRINE HCL 10 MG/ML IJ SOLN
0.0000 ug/min | INTRAMUSCULAR | Status: DC
  Administered 2016-07-12: 30 ug/min via INTRAVENOUS
  Filled 2016-07-12: qty 2

## 2016-07-12 MED ORDER — TRANEXAMIC ACID (OHS) BOLUS VIA INFUSION
15.0000 mg/kg | INTRAVENOUS | Status: DC
  Filled 2016-07-12: qty 870

## 2016-07-12 MED ORDER — LACTATED RINGERS IV SOLN
INTRAVENOUS | Status: DC
  Administered 2016-07-13: 04:00:00 via INTRAVENOUS

## 2016-07-12 MED ORDER — SODIUM CHLORIDE 0.9 % WEIGHT BASED INFUSION
3.0000 mL/kg/h | INTRAVENOUS | Status: DC
  Administered 2016-07-12: 3 mL/kg/h via INTRAVENOUS

## 2016-07-12 MED ORDER — LACTATED RINGERS IV SOLN
INTRAVENOUS | Status: DC | PRN
  Administered 2016-07-12: 12:00:00 via INTRAVENOUS

## 2016-07-12 MED ORDER — INSULIN REGULAR BOLUS VIA INFUSION
0.0000 [IU] | Freq: Three times a day (TID) | INTRAVENOUS | Status: DC
Start: 2016-07-12 — End: 2016-07-13
  Filled 2016-07-12: qty 10

## 2016-07-12 MED ORDER — PROPOFOL 10 MG/ML IV BOLUS
INTRAVENOUS | Status: AC
  Filled 2016-07-12: qty 40

## 2016-07-12 MED ORDER — VERAPAMIL HCL 2.5 MG/ML IV SOLN
INTRA_ARTERIAL | Status: DC | PRN
  Administered 2016-07-12: 10:00:00 via INTRA_ARTERIAL

## 2016-07-12 MED ORDER — DEXTROSE 5 % IV SOLN
INTRAVENOUS | Status: DC | PRN
  Administered 2016-07-12: .75 g via INTRAVENOUS
  Administered 2016-07-12: 1.5 g via INTRAVENOUS

## 2016-07-12 MED ORDER — IOPAMIDOL (ISOVUE-370) INJECTION 76%
INTRAVENOUS | Status: AC
  Filled 2016-07-12: qty 100

## 2016-07-12 MED ORDER — LIDOCAINE 2% (20 MG/ML) 5 ML SYRINGE
INTRAMUSCULAR | Status: AC
  Filled 2016-07-12: qty 5

## 2016-07-12 MED ORDER — SODIUM BICARBONATE 8.4 % IV SOLN: 50.0000 meq | Freq: Once | INTRAVENOUS | Status: DC

## 2016-07-12 MED ORDER — DOPAMINE-DEXTROSE 3.2-5 MG/ML-% IV SOLN
INTRAVENOUS | Status: DC | PRN
  Administered 2016-07-12: 3 ug/kg/min via INTRAVENOUS

## 2016-07-12 MED ORDER — CEFUROXIME SODIUM 750 MG IJ SOLR
750.0000 mg | INTRAMUSCULAR | Status: DC
  Filled 2016-07-12: qty 750

## 2016-07-12 MED ORDER — SODIUM CHLORIDE 0.9 % IV SOLN
INTRAVENOUS | Status: DC
  Filled 2016-07-12: qty 2.5

## 2016-07-12 MED ORDER — PROTAMINE SULFATE 10 MG/ML IV SOLN
INTRAVENOUS | Status: DC | PRN
  Administered 2016-07-12: 30 mg via INTRAVENOUS
  Administered 2016-07-12: 20 mg via INTRAVENOUS
  Administered 2016-07-12: 30 mg via INTRAVENOUS
  Administered 2016-07-12: 50 mg via INTRAVENOUS
  Administered 2016-07-12: 40 mg via INTRAVENOUS

## 2016-07-12 MED ORDER — ONDANSETRON HCL 4 MG/2ML IJ SOLN
INTRAMUSCULAR | Status: AC
  Filled 2016-07-12: qty 2

## 2016-07-12 MED ORDER — PHENYLEPHRINE HCL 10 MG/ML IJ SOLN
INTRAMUSCULAR | Status: DC | PRN
  Administered 2016-07-12 (×2): 80 ug via INTRAVENOUS

## 2016-07-12 MED ORDER — ASPIRIN 81 MG PO CHEW: 324.0000 mg | CHEWABLE_TABLET | Freq: Every day | ORAL | Status: DC

## 2016-07-12 MED ORDER — TRANEXAMIC ACID 1000 MG/10ML IV SOLN
1000.0000 mg | Freq: Once | INTRAVENOUS | Status: AC
  Administered 2016-07-12: 1000 mg via INTRAVENOUS
  Filled 2016-07-12: qty 10

## 2016-07-12 MED ORDER — MIDAZOLAM HCL 2 MG/2ML IJ SOLN
INTRAMUSCULAR | Status: DC | PRN
  Administered 2016-07-12: 1 mg via INTRAVENOUS

## 2016-07-12 MED ORDER — HEMOSTATIC AGENTS (NO CHARGE) OPTIME
TOPICAL | Status: DC | PRN
  Administered 2016-07-12: 1 via TOPICAL

## 2016-07-12 MED ORDER — SODIUM CHLORIDE 0.9 % IV SOLN
INTRAVENOUS | Status: DC | PRN
  Administered 2016-07-12: 1.5 mg/kg/h via INTRAVENOUS

## 2016-07-12 MED ORDER — SODIUM CHLORIDE 0.9 % IV SOLN: 250.0000 mL | INTRAVENOUS | Status: DC

## 2016-07-12 MED ORDER — SODIUM CHLORIDE 0.45 % IV SOLN
INTRAVENOUS | Status: DC | PRN
  Administered 2016-07-12: 16:00:00 via INTRAVENOUS

## 2016-07-12 MED ORDER — VANCOMYCIN HCL IN DEXTROSE 1-5 GM/200ML-% IV SOLN
1000.0000 mg | Freq: Once | INTRAVENOUS | Status: AC
  Administered 2016-07-12: 1000 mg via INTRAVENOUS
  Filled 2016-07-12: qty 200

## 2016-07-12 MED ORDER — VERAPAMIL HCL 2.5 MG/ML IV SOLN: INTRAVENOUS | Status: DC | PRN

## 2016-07-12 MED ORDER — EPHEDRINE 5 MG/ML INJ
INTRAVENOUS | Status: AC
  Filled 2016-07-12: qty 20

## 2016-07-12 MED ORDER — TRANEXAMIC ACID (OHS) PUMP PRIME SOLUTION
2.0000 mg/kg | INTRAVENOUS | Status: DC
  Filled 2016-07-12: qty 1.16

## 2016-07-12 MED ORDER — HEPARIN BOLUS VIA INFUSION
2000.0000 [IU] | Freq: Once | INTRAVENOUS | Status: AC
  Administered 2016-07-12: 2000 [IU] via INTRAVENOUS
  Filled 2016-07-12: qty 2000

## 2016-07-12 MED ORDER — ONDANSETRON HCL 4 MG/2ML IJ SOLN: 4.0000 mg | Freq: Four times a day (QID) | INTRAMUSCULAR | Status: DC | PRN

## 2016-07-12 MED ORDER — PAPAVERINE HCL 30 MG/ML IJ SOLN
INTRAMUSCULAR | Status: DC
  Filled 2016-07-12: qty 2.5

## 2016-07-12 MED ORDER — DOCUSATE SODIUM 100 MG PO CAPS
200.0000 mg | ORAL_CAPSULE | Freq: Every day | ORAL | Status: DC
  Administered 2016-07-13: 200 mg via ORAL
  Filled 2016-07-12 (×2): qty 2

## 2016-07-12 MED ORDER — PROTAMINE SULFATE 10 MG/ML IV SOLN
INTRAVENOUS | Status: AC
  Filled 2016-07-12: qty 25

## 2016-07-12 MED ORDER — LIDOCAINE HCL (PF) 1 % IJ SOLN
INTRAMUSCULAR | Status: AC
  Filled 2016-07-12: qty 30

## 2016-07-12 MED ORDER — INSULIN REGULAR HUMAN 100 UNIT/ML IJ SOLN
INTRAMUSCULAR | Status: DC | PRN
  Administered 2016-07-12: 1.2 [IU]/h via INTRAVENOUS

## 2016-07-12 MED ORDER — ASPIRIN EC 81 MG PO TBEC: 81.0000 mg | DELAYED_RELEASE_TABLET | Freq: Every day | ORAL | Status: DC

## 2016-07-12 MED ORDER — NITROGLYCERIN IN D5W 200-5 MCG/ML-% IV SOLN
2.0000 ug/min | INTRAVENOUS | Status: DC
  Administered 2016-07-12: 5 ug/min via INTRAVENOUS
  Filled 2016-07-12: qty 250

## 2016-07-12 MED ORDER — VERAPAMIL HCL 2.5 MG/ML IV SOLN
INTRAVENOUS | Status: AC
  Filled 2016-07-12: qty 2

## 2016-07-12 MED ORDER — FENTANYL CITRATE (PF) 250 MCG/5ML IJ SOLN
INTRAMUSCULAR | Status: AC
  Filled 2016-07-12: qty 20

## 2016-07-12 MED ORDER — PHENYLEPHRINE HCL 10 MG/ML IJ SOLN
INTRAMUSCULAR | Status: DC | PRN
  Administered 2016-07-12: 20 ug/min via INTRAVENOUS

## 2016-07-12 MED ORDER — SODIUM BICARBONATE 8.4 % IV SOLN
50.0000 meq | Freq: Once | INTRAVENOUS | Status: AC
  Administered 2016-07-12: 50 meq via INTRAVENOUS

## 2016-07-12 MED ORDER — POTASSIUM CHLORIDE 2 MEQ/ML IV SOLN
80.0000 meq | INTRAVENOUS | Status: DC
  Filled 2016-07-12: qty 40

## 2016-07-12 MED ORDER — LIDOCAINE HCL (PF) 1 % IJ SOLN
INTRAMUSCULAR | Status: DC | PRN
  Administered 2016-07-12: 1 mL

## 2016-07-12 MED ORDER — ROCURONIUM BROMIDE 100 MG/10ML IV SOLN
INTRAVENOUS | Status: DC | PRN
  Administered 2016-07-12 (×2): 50 mg via INTRAVENOUS
  Administered 2016-07-12: 40 mg via INTRAVENOUS

## 2016-07-12 MED ORDER — HEPARIN (PORCINE) IN NACL 100-0.45 UNIT/ML-% IJ SOLN
950.0000 [IU]/h | INTRAMUSCULAR | Status: DC
  Filled 2016-07-12: qty 250

## 2016-07-12 MED ORDER — NITROGLYCERIN 1 MG/10 ML FOR IR/CATH LAB
INTRA_ARTERIAL | Status: AC
  Filled 2016-07-12: qty 10

## 2016-07-12 MED ORDER — DOPAMINE-DEXTROSE 3.2-5 MG/ML-% IV SOLN
0.0000 ug/kg/min | INTRAVENOUS | Status: DC
  Filled 2016-07-12: qty 250

## 2016-07-12 MED ORDER — MORPHINE SULFATE (PF) 2 MG/ML IV SOLN
2.0000 mg | INTRAVENOUS | Status: DC | PRN
  Administered 2016-07-12 – 2016-07-13 (×4): 2 mg via INTRAVENOUS
  Administered 2016-07-13: 4 mg via INTRAVENOUS
  Administered 2016-07-13 (×2): 2 mg via INTRAVENOUS
  Filled 2016-07-12 (×4): qty 1
  Filled 2016-07-12: qty 2
  Filled 2016-07-12 (×2): qty 1
  Filled 2016-07-12: qty 2
  Filled 2016-07-12: qty 1

## 2016-07-12 MED ORDER — 0.9 % SODIUM CHLORIDE (POUR BTL) OPTIME
TOPICAL | Status: DC | PRN
  Administered 2016-07-12: 6000 mL

## 2016-07-12 MED ORDER — OXYCODONE HCL 5 MG PO TABS
5.0000 mg | ORAL_TABLET | ORAL | Status: DC | PRN
  Administered 2016-07-13: 5 mg via ORAL
  Filled 2016-07-12: qty 1

## 2016-07-12 MED ORDER — SODIUM CHLORIDE 0.9 % IV SOLN
30.0000 ug/min | INTRAVENOUS | Status: DC
  Filled 2016-07-12: qty 2

## 2016-07-12 SURGICAL SUPPLY — 12 items

## 2016-07-12 SURGICAL SUPPLY — 96 items
BAG DECANTER FOR FLEXI CONT (MISCELLANEOUS) ×2 IMPLANT
BANDAGE ACE 4X5 VEL STRL LF (GAUZE/BANDAGES/DRESSINGS) IMPLANT
BANDAGE ACE 6X5 VEL STRL LF (GAUZE/BANDAGES/DRESSINGS) IMPLANT
BASKET HEART (ORDER IN 25'S) (MISCELLANEOUS) ×1
BASKET HEART (ORDER IN 25S) (MISCELLANEOUS) ×1 IMPLANT
BLADE STERNUM SYSTEM 6 (BLADE) ×2 IMPLANT
BNDG GAUZE ELAST 4 BULKY (GAUZE/BANDAGES/DRESSINGS) IMPLANT
CANISTER SUCTION 2500CC (MISCELLANEOUS) ×2 IMPLANT
CATH ROBINSON RED A/P 18FR (CATHETERS) ×4 IMPLANT
CATH THORACIC 28FR (CATHETERS) ×2 IMPLANT
CATH THORACIC 36FR (CATHETERS) ×2 IMPLANT
CATH THORACIC 36FR RT ANG (CATHETERS) ×2 IMPLANT
CLIP TI MEDIUM 24 (CLIP) IMPLANT
CLIP TI WIDE RED SMALL 24 (CLIP) IMPLANT
CRADLE DONUT ADULT HEAD (MISCELLANEOUS) ×2 IMPLANT
DRAPE CARDIOVASCULAR INCISE (DRAPES) ×1
DRAPE SLUSH/WARMER DISC (DRAPES) ×2 IMPLANT
DRAPE SRG 135X102X78XABS (DRAPES) ×1 IMPLANT
DRSG COVADERM 4X14 (GAUZE/BANDAGES/DRESSINGS) ×2 IMPLANT
ELECT CAUTERY BLADE 6.4 (BLADE) ×2 IMPLANT
ELECT REM PT RETURN 9FT ADLT (ELECTROSURGICAL) ×4
ELECTRODE REM PT RTRN 9FT ADLT (ELECTROSURGICAL) ×2 IMPLANT
FELT TEFLON 1X6 (MISCELLANEOUS) ×2 IMPLANT
GAUZE SPONGE 4X4 12PLY STRL (GAUZE/BANDAGES/DRESSINGS) ×2 IMPLANT
GLOVE BIO SURGEON STRL SZ 6 (GLOVE) IMPLANT
GLOVE BIO SURGEON STRL SZ 6.5 (GLOVE) ×2 IMPLANT
GLOVE BIO SURGEON STRL SZ7 (GLOVE) ×4 IMPLANT
GLOVE BIO SURGEON STRL SZ7.5 (GLOVE) ×2 IMPLANT
GLOVE BIOGEL PI IND STRL 6 (GLOVE) IMPLANT
GLOVE BIOGEL PI IND STRL 6.5 (GLOVE) ×4 IMPLANT
GLOVE BIOGEL PI IND STRL 7.0 (GLOVE) ×1 IMPLANT
GLOVE BIOGEL PI INDICATOR 6 (GLOVE)
GLOVE BIOGEL PI INDICATOR 6.5 (GLOVE) ×4
GLOVE BIOGEL PI INDICATOR 7.0 (GLOVE) ×1
GLOVE EUDERMIC 7 POWDERFREE (GLOVE) ×8 IMPLANT
GLOVE ORTHO TXT STRL SZ7.5 (GLOVE) IMPLANT
GOWN STRL REUS W/ TWL LRG LVL3 (GOWN DISPOSABLE) ×4 IMPLANT
GOWN STRL REUS W/ TWL XL LVL3 (GOWN DISPOSABLE) ×2 IMPLANT
GOWN STRL REUS W/TWL LRG LVL3 (GOWN DISPOSABLE) ×4
GOWN STRL REUS W/TWL XL LVL3 (GOWN DISPOSABLE) ×2
HEMOSTAT POWDER SURGIFOAM 1G (HEMOSTASIS) ×6 IMPLANT
HEMOSTAT SURGICEL 2X14 (HEMOSTASIS) ×2 IMPLANT
INSERT FOGARTY 61MM (MISCELLANEOUS) IMPLANT
INSERT FOGARTY XLG (MISCELLANEOUS) IMPLANT
KIT BASIN OR (CUSTOM PROCEDURE TRAY) ×2 IMPLANT
KIT CATH CPB BARTLE (MISCELLANEOUS) ×2 IMPLANT
KIT ROOM TURNOVER OR (KITS) ×2 IMPLANT
KIT SUCTION CATH 14FR (SUCTIONS) ×2 IMPLANT
KIT VASOVIEW HEMOPRO VH 3000 (KITS) IMPLANT
NS IRRIG 1000ML POUR BTL (IV SOLUTION) ×12 IMPLANT
PACK OPEN HEART (CUSTOM PROCEDURE TRAY) ×2 IMPLANT
PAD ARMBOARD 7.5X6 YLW CONV (MISCELLANEOUS) ×4 IMPLANT
PAD ELECT DEFIB RADIOL ZOLL (MISCELLANEOUS) ×2 IMPLANT
PENCIL BUTTON HOLSTER BLD 10FT (ELECTRODE) ×2 IMPLANT
PUNCH AORTIC ROTATE 4.0MM (MISCELLANEOUS) IMPLANT
PUNCH AORTIC ROTATE 4.5MM 8IN (MISCELLANEOUS) ×2 IMPLANT
PUNCH AORTIC ROTATE 5MM 8IN (MISCELLANEOUS) IMPLANT
SET CARDIOPLEGIA MPS 5001102 (MISCELLANEOUS) ×2 IMPLANT
SPONGE INTESTINAL PEANUT (DISPOSABLE) IMPLANT
SPONGE LAP 18X18 X RAY DECT (DISPOSABLE) IMPLANT
SPONGE LAP 4X18 X RAY DECT (DISPOSABLE) IMPLANT
SUT BONE WAX W31G (SUTURE) ×2 IMPLANT
SUT MNCRL AB 4-0 PS2 18 (SUTURE) IMPLANT
SUT PROLENE 3 0 SH DA (SUTURE) IMPLANT
SUT PROLENE 3 0 SH1 36 (SUTURE) ×2 IMPLANT
SUT PROLENE 4 0 RB 1 (SUTURE)
SUT PROLENE 4 0 SH DA (SUTURE) IMPLANT
SUT PROLENE 4-0 RB1 .5 CRCL 36 (SUTURE) IMPLANT
SUT PROLENE 5 0 C 1 36 (SUTURE) IMPLANT
SUT PROLENE 6 0 C 1 30 (SUTURE) IMPLANT
SUT PROLENE 7 0 BV 1 (SUTURE) IMPLANT
SUT PROLENE 7 0 BV1 MDA (SUTURE) ×2 IMPLANT
SUT PROLENE 8 0 BV175 6 (SUTURE) IMPLANT
SUT SILK  1 MH (SUTURE)
SUT SILK 1 MH (SUTURE) IMPLANT
SUT STEEL STERNAL CCS#1 18IN (SUTURE) IMPLANT
SUT STEEL SZ 6 DBL 3X14 BALL (SUTURE) IMPLANT
SUT VIC AB 1 CTX 36 (SUTURE) ×3
SUT VIC AB 1 CTX36XBRD ANBCTR (SUTURE) ×3 IMPLANT
SUT VIC AB 2-0 CT1 27 (SUTURE)
SUT VIC AB 2-0 CT1 TAPERPNT 27 (SUTURE) IMPLANT
SUT VIC AB 2-0 CTX 27 (SUTURE) IMPLANT
SUT VIC AB 3-0 SH 27 (SUTURE)
SUT VIC AB 3-0 SH 27X BRD (SUTURE) IMPLANT
SUT VIC AB 3-0 X1 27 (SUTURE) IMPLANT
SUT VICRYL 4-0 PS2 18IN ABS (SUTURE) IMPLANT
SUTURE E-PAK OPEN HEART (SUTURE) ×2 IMPLANT
SYSTEM SAHARA CHEST DRAIN ATS (WOUND CARE) ×2 IMPLANT
TOWEL GREEN STERILE (TOWEL DISPOSABLE) ×8 IMPLANT
TOWEL GREEN STERILE FF (TOWEL DISPOSABLE) ×4 IMPLANT
TOWEL OR 17X24 6PK STRL BLUE (TOWEL DISPOSABLE) ×2 IMPLANT
TOWEL OR 17X26 10 PK STRL BLUE (TOWEL DISPOSABLE) ×2 IMPLANT
TRAY FOLEY IC TEMP SENS 16FR (CATHETERS) ×2 IMPLANT
TUBING INSUFFLATION (TUBING) IMPLANT
UNDERPAD 30X30 (UNDERPADS AND DIAPERS) ×2 IMPLANT
WATER STERILE IRR 1000ML POUR (IV SOLUTION) ×4 IMPLANT

## 2016-07-12 NOTE — Transfer of Care (Signed)
Immediate Anesthesia Transfer of Care Note  Patient: Torin Lackland  Procedure(s) Performed: Procedure(s) with comments: CORONARY ARTERY BYPASS GRAFTING (CABG), ON PUMP, TIMES ONE, USING LEFT INTERNAL MAMMARY ARTERY WITH TEE (N/A) - LIMA to LAD  Patient Location: SICU  Anesthesia Type:General  Level of Consciousness: Patient remains intubated per anesthesia plan  Airway & Oxygen Therapy: Patient remains intubated per anesthesia plan and Patient placed on Ventilator (see vital sign flow sheet for setting)  Post-op Assessment: Report given to RN and Post -op Vital signs reviewed and stable  Post vital signs: Reviewed and stable  Last Vitals:  Vitals:   07/12/16 1600 07/12/16 1615  BP: 105/78   Pulse:    Resp: 17 15  Temp: 36.9 C 37 C    Last Pain:  Vitals:   07/12/16 1037  TempSrc:   PainSc: 3          Complications: No apparent anesthesia complications

## 2016-07-12 NOTE — Progress Notes (Signed)
  Echocardiogram Echocardiogram Transesophageal has been performed.  [name] 07/12/2016, 1:04 PM

## 2016-07-12 NOTE — Anesthesia Procedure Notes (Signed)
Central Venous Catheter Insertion Performed by: Suzette Battiest Start/End2/17/2018 12:20 PM, 07/12/2016 12:30 PM Patient location: Pre-op. Preanesthetic checklist: patient identified, IV checked, site marked, risks and benefits discussed, surgical consent, monitors and equipment checked, pre-op evaluation, timeout performed and anesthesia consent Hand hygiene performed  and maximum sterile barriers used  PA cath was placed.Swan type:thermodilution PA Cath depth:47 Procedure performed using ultrasound guided technique. Ultrasound Notes:anatomy identified, needle tip was noted to be adjacent to the nerve/plexus identified, no ultrasound evidence of intravascular and/or intraneural injection and image(s) printed for medical record Attempts: 1 Patient tolerated the procedure well with no immediate complications.

## 2016-07-12 NOTE — OR Nursing (Signed)
Twenty minute call to SICU charge nurse at 1517. Spoke to [place].

## 2016-07-12 NOTE — Progress Notes (Signed)
Patient ID: [name], female   DOB: 1970/07/09, 46 y.o.   MRN: [id number]  SICU Evening Rounds:   Hemodynamically stable  CI = 2.4  Urine output good  CT output low  CBC    Component Value Date/Time   WBC 11.5 (H) 07/12/2016 1600   RBC 3.29 (L) 07/12/2016 1600   HGB 8.5 (L) 07/12/2016 1602   HCT 25.0 (L) 07/12/2016 1602   PLT 156 07/12/2016 1600   MCV 84.8 07/12/2016 1600   MCH 29.2 07/12/2016 1600   MCHC 34.4 07/12/2016 1600   RDW 13.2 07/12/2016 1600   LYMPHSABS 1.7 08/10/2011 2049   MONOABS 0.5 08/10/2011 2049   EOSABS 0.1 08/10/2011 2049   BASOSABS 0.0 08/10/2011 2049     BMET    Component Value Date/Time   NA 143 07/12/2016 1602   K 3.1 (L) 07/12/2016 1602   CL 106 07/12/2016 0034   CO2 23 07/12/2016 0034   GLUCOSE 144 (H) 07/12/2016 1602   BUN 11 07/12/2016 0034   CREATININE 0.64 07/12/2016 0034   CALCIUM 9.2 07/12/2016 0034   GFRNONAA >60 07/12/2016 0034   GFRAA >60 07/12/2016 0034     A/P:  Stable postop course. Continue current plans

## 2016-07-12 NOTE — Progress Notes (Signed)
    Subjective:  Still having some SSCP   Objective:  Vitals:   07/11/16 2316 07/12/16 0435 07/12/16 0600 07/12/16 0751  BP: (!) 144/96 95/64 112/75 100/66  Pulse:  82 87 80  Resp: 16 18  17   Temp: 98.2 F (36.8 C) 98.7 F (37.1 C)  98.5 F (36.9 C)  TempSrc:  Oral  Oral  SpO2: 95% 98% 98% 98%  Weight:  127 lb 12.8 oz (58 kg)    Height:        Intake/Output from previous day: No intake or output data in the 24 hours ending 07/12/16 0855  Physical Exam: Affect appropriate Healthy:  appears stated age HEENT: normal Neck supple with no adenopathy JVP normal no bruits no thyromegaly Lungs clear with no wheezing and good diaphragmatic motion Heart:  S1/S2 SEM  murmur, no rub, gallop or click PMI normal Abdomen: benighn, BS positve, no tenderness, no AAA no bruit.  No HSM or HJR Distal pulses intact with no bruits No edema Neuro non-focal Skin warm and dry No muscular weakness   Lab Results: Basic Metabolic Panel:  Recent Labs  07/11/16 2045 07/12/16 0034  NA 137 139  K 3.5 3.6  CL 106 106  CO2 23 23  GLUCOSE 165* 146*  BUN 16 11  CREATININE 0.64 0.64  CALCIUM 9.6 9.2   Liver Function Tests:  Recent Labs  07/11/16 2045  AST 68*  ALT 69*  ALKPHOS 56  BILITOT 1.2  PROT 8.7*  ALBUMIN 4.8    Recent Labs  07/11/16 2045  LIPASE 49   CBC:  Recent Labs  07/11/16 2045  WBC 8.6  HGB 14.0  HCT 39.6  MCV 82.0  PLT 238   Cardiac Enzymes:  Recent Labs  07/11/16 2045 07/12/16 0034  TROPONINI 4.53* 7.45*     Recent Labs  07/12/16 0034  TSH 1.146    Imaging: Dg Chest 2 View  Result Date: 07/11/2016 CLINICAL DATA:  Left upper quadrant chest pain extending into the left axillary region, shoulder and neck for 2 weeks. Worsening symptoms today. EXAM: CHEST  2 VIEW COMPARISON:  Portable chest 04/05/2008. FINDINGS: The heart size and mediastinal contours are normal. The lungs are clear. There is no pleural effusion or pneumothorax. No acute  osseous findings are identified. Mild thoracic spine degenerative changes are noted. Telemetry leads overlie the chest. There are surgical clips in the right paratracheal region. IMPRESSION: No active cardiopulmonary process. Electronically Signed   By: Richardean Sale M.D.   On: 07/11/2016 20:18    Cardiac Studies:  ECG: marked anterior T wave inversions    Telemetry: SR no VT   Echo:   Medications:   . sodium chloride   Intravenous STAT  . aspirin EC  81 mg Oral Daily  . atorvastatin  80 mg Oral q1800  . metoprolol tartrate  12.5 mg Oral BID     . heparin 950 Units/hr (07/12/16 [id number])    Assessment/Plan:  SEMI:  Chest pain this am troponin now over 7 called Dr [name] interventionalist on call Favor diagnostic Cath this am discussed with patient willing to proceed Husband coming in Continue ASA/Beta Blocker And heparin F/U echo for her SEM   Jenkins Rouge 07/12/2016, 8:55 AM

## 2016-07-12 NOTE — Progress Notes (Signed)
[place] for heparin Indication: chest pain/ACS  Allergies  Allergen Reactions  . Acetaminophen Other (See Comments)    Causes headaches   . Bactrim Itching and Rash         Patient Measurements: Height: 4\' 11"  (149.9 cm) Weight: 127 lb 12.8 oz (58 kg) IBW/kg (Calculated) : 43.2 Heparin Dosing Weight: 57.5 kg  Vital Signs: Temp: 98.7 F (37.1 C) (02/17 0435) Temp Source: Oral (02/17 0435) BP: 95/64 (02/17 0435) Pulse Rate: 82 (02/17 0435)  Labs:  Recent Labs  07/11/16 2045 07/12/16 0034 07/12/16 0536  HGB 14.0  --   --   HCT 39.6  --   --   PLT 238  --   --   APTT 31  --   --   LABPROT 12.5  --   --   INR 0.93  --   --   HEPARINUNFRC  --   --  0.14*  CREATININE 0.64 0.64  --   TROPONINI 4.53* 7.45*  --     Estimated Creatinine Clearance: 66.7 mL/min (by C-G formula based on SCr of 0.64 mg/dL).  Assessment: 46 y.o. female with chest pain for heparin  Goal of Therapy:  Heparin level 0.3-0.7 units/ml Monitor platelets by anticoagulation protocol: Yes   Plan:  Heparin 2000 units IV bolus, then increase heparin 950 units/hr Check heparin level in 6 hours.  Phillis Knack, PharmD, BCPS

## 2016-07-12 NOTE — OR Nursing (Signed)
Forty-five minute call to SICU charge nurse at 1451. Spoke to [place].

## 2016-07-12 NOTE — Anesthesia Procedure Notes (Signed)
Procedure Name: Intubation Date/Time: 07/12/2016 12:12 PM Performed by: Ollen Bowl Pre-anesthesia Checklist: Patient identified, Emergency Drugs available, Suction available, Patient being monitored and Timeout performed Patient Re-evaluated:Patient Re-evaluated prior to inductionOxygen Delivery Method: Circle system utilized and Simple face mask Preoxygenation: Pre-oxygenation with 100% oxygen Intubation Type: IV induction Ventilation: Mask ventilation without difficulty Laryngoscope Size: Glidescope Grade View: Grade II Tube type: Subglottic suction tube Tube size: 7.5 mm Number of attempts: 1 Airway Equipment and Method: Patient positioned with wedge pillow,  Stylet and Video-laryngoscopy Placement Confirmation: ETT inserted through vocal cords under direct vision,  positive ETCO2 and breath sounds checked- equal and bilateral Secured at: 20 cm Tube secured with: Tape Dental Injury: Teeth and Oropharynx as per pre-operative assessment

## 2016-07-12 NOTE — Op Note (Signed)
CARDIOVASCULAR SURGERY OPERATIVE NOTE  07/12/2016  Surgeon:  Gaye Pollack, MD  First Assistant: Jadene Pierini,  PA-C   Preoperative Diagnosis:  High grade ostial/proximal LAD coronary artery stenosis s/p acute NSTEMI   Postoperative Diagnosis:  Same   Procedure: Emergent  1. Median Sternotomy 2. Extracorporeal circulation 3.   Coronary artery bypass grafting x 1   Left internal mammary graft to the LAD   Anesthesia:  General Endotracheal   Clinical History/Surgical Indication:  The patient has a history of hypercholesterolemia and statin intolerance and has had chest pain off and on over the past week. Thursday she had a bad episode but did not seek medical care and went to work Friday. Then last night had another severe episode and presented to [facility] ER. ECG showed anterolateral T-wave inversion with troponin of 4. Her pain resolved after admission and she was transferred to [facility] for further evaluation. This am her pain returned on IV heparin and topical nitrates and troponin was up to 7.45. She was taken to cath lab and has a 99% ostial and 95% proximal LAD stenosis. The LCX and RCA look fine. LVEF is 45-50% with anterolateral hypokinesis. She is still having a little chest pain. I was called to the cath lab to evaluate.  She has a high grade ostial and proximal LAD stenosis with acute NSTEMI and stuttering chest pain o IV heparin, NTG. This will require emergent CABG to the LAD. I discussed the operative procedure with the patient, her husband and her brother-in-law in [place] by conference call who is an anesthesiologist including alternatives, benefits and risks; including but not limited to bleeding, blood transfusion, infection, stroke, myocardial infarction, graft failure, heart block requiring a permanent pacemaker, organ dysfunction, and death.  [name] understands and agrees to  proceed.   Preparation:  The patient was seen in the preoperative holding area and the correct patient, correct operation were confirmed with the patient after reviewing the medical record and catheterization. The consent was signed by me. Preoperative antibiotics were given. A pulmonary arterial line and radial arterial line were placed by the anesthesia team. The patient was taken back to the operating room and positioned supine on the operating room table. After being placed under general endotracheal anesthesia by the anesthesia team a foley catheter was placed. The neck, chest, abdomen, and both legs were prepped with betadine soap and solution and draped in the usual sterile manner. A surgical time-out was taken and the correct patient and operative procedure were confirmed with the nursing and anesthesia staff.  TEE: performed by Dr. Suzette Battiest  This showed fairly normal LV function. Trivial MR, No AS or AI. Trivial TR, normal RV function.  Cardiopulmonary Bypass:  A median sternotomy was performed. The pericardium was opened in the midline. Right ventricular function appeared normal. The ascending aorta was of normal size and had no palpable plaque. There were no contraindications to aortic cannulation or cross-clamping. The patient was fully systemically heparinized and the ACT was maintained > 400 sec. The proximal aortic arch was cannulated with a 20 F aortic cannula for arterial inflow. Venous cannulation was performed via the right atrial appendage using a two-staged venous cannula. An antegrade cardioplegia/vent cannula was inserted into the mid-ascending aorta. Aortic occlusion was performed with a single cross-clamp. Temperature was maintained at 37 degrees Centigrade and topical cooling of the heart with iced saline were used. Hyperkalemic antegrade cold blood cardioplegia was used to induce diastolic arrest and was then given at about  20 minute intervals throughout the period of  arrest to maintain myocardial temperature at or below 10 degrees centigrade. A temperature probe was inserted into the interventricular septum and an insulating pad was placed in the pericardium.   Left internal mammary harvest:  The left side of the sternum was retracted using the Rultract retractor. The left internal mammary artery was harvested as a pedicle graft. All side branches were clipped. It was a small to medium-sized vessel of good quality with good blood flow. It was ligated distally and divided. It was sprayed with topical papaverine solution to prevent vasospasm.   Coronary arteries:  The coronary arteries were examined.   LAD:  No disease in the mid or distal vessel which was large.  LCX:  No disease  RCA:  No disease   Grafts:  1. LIMA to the LAD: 2.5 mm. It was sewn end to side using 8-0 prolene continuous suture.    Completion:  The patient was rewarmed to 37 degrees Centigrade. The clamp was removed from the LIMA pedicle and there was rapid warming of the septum and return of ventricular fibrillation. The crossclamp was removed with a time of 20 minutes. There was spontaneous return of sinus rhythm. The distal and proximal anastomoses were checked for hemostasis. The position of the grafts was satisfactory. Two temporary epicardial pacing wires were placed on the right atrium and two on the right ventricle. The patient was weaned from CPB without difficulty on no inotropes. CPB time was 33 minutes. Cardiac output was 4 LPM. TEE showed preserved LV function. Heparin was fully reversed with protamine and the aortic and venous cannulas removed. Hemostasis was achieved. Mediastinal and left pleural drainage tubes were placed. The sternum was closed with  #6 stainless steel wires. The fascia was closed with continuous # 1 vicryl suture. The subcutaneous tissue was closed with 2-0 vicryl continuous suture. The skin was closed with 3-0 vicryl subcuticular suture. All sponge,  needle, and instrument counts were reported correct at the end of the case. Dry sterile dressings were placed over the incisions and around the chest tubes which were connected to pleurevac suction. The patient was then transported to the surgical intensive care unit in critical but stable condition.

## 2016-07-12 NOTE — Progress Notes (Signed)
Right radial 39french arterial sheath removed and a TR band placed.  12cc of air placed in the band.  Good sat waveform on the right thumb.  Site looks good with no hematoma.  RN will continue to monitor and remove air from band when needed.

## 2016-07-12 NOTE — Brief Op Note (Signed)
07/11/2016 - 07/12/2016  2:21 PM  PATIENT:  [name]  46 y.o. female  PRE-OPERATIVE DIAGNOSIS:  LEFT HEART CATH  POST-OPERATIVE DIAGNOSIS:  CAD  PROCEDURE:  Procedure(s): CORONARY ARTERY BYPASS GRAFTING (CABG), ON PUMP, TIMES ONE, USING LEFT INTERNAL MAMMARY ARTERY WITH TEE (N/A) LIMA-LAD  SURGEON:  Surgeon(s) and Role:    * Gaye Pollack, MD - Primary  PHYSICIAN ASSISTANT: WAYNE GOLD PA-C  ANESTHESIA:   general  EBL:  Total I/O In: 1070.2 [I.V.:1070.2] Out: 250 [Urine:250]  BLOOD ADMINISTERED:none  DRAINS: ROUTINE   LOCAL MEDICATIONS USED:  NONE  SPECIMEN:  No Specimen  DISPOSITION OF SPECIMEN:  N/A  COUNTS:  YES  TOURNIQUET:  * No tourniquets in log *  DICTATION: .Dragon Dictation  PLAN OF CARE: Admit to inpatient   PATIENT DISPOSITION:  ICU - intubated and hemodynamically stable.   Delay start of Pharmacological VTE agent (>24hrs) due to surgical blood loss or risk of bleeding: yes  COMPLICATIONS: NO KNOWN

## 2016-07-12 NOTE — Anesthesia Preprocedure Evaluation (Signed)
Anesthesia Evaluation  Patient identified by MRN, date of birth, ID band Patient awake    Reviewed: Allergy & Precautions, NPO status , Patient's Chart, lab work & pertinent test results  History of Anesthesia Complications (+) DIFFICULT AIRWAY  Airway Mallampati: III  TM Distance: <3 FB Neck ROM: Full  Mouth opening: Limited Mouth Opening  Dental  (+) Dental Advisory Given   Pulmonary neg pulmonary ROS,    breath sounds clear to auscultation       Cardiovascular + CAD and + Past MI   Rhythm:Regular Rate:Normal     Neuro/Psych negative neurological ROS     GI/Hepatic negative GI ROS, Neg liver ROS,   Endo/Other  negative endocrine ROS  Renal/GU negative Renal ROS     Musculoskeletal   Abdominal   Peds  Hematology negative hematology ROS (+)   Anesthesia Other Findings   Reproductive/Obstetrics                             Lab Results  Component Value Date   WBC 8.6 07/11/2016   HGB 14.0 07/11/2016   HCT 39.6 07/11/2016   MCV 82.0 07/11/2016   PLT 238 07/11/2016   Lab Results  Component Value Date   CREATININE 0.64 07/12/2016   BUN 11 07/12/2016   NA 139 07/12/2016   K 3.6 07/12/2016   CL 106 07/12/2016   CO2 23 07/12/2016    Anesthesia Physical Anesthesia Plan  ASA: IV  Anesthesia Plan: General   Post-op Pain Management:    Induction: Intravenous  Airway Management Planned: Oral ETT and Video Laryngoscope Planned  Additional Equipment: PA Cath, Ultrasound Guidance Line Placement, CVP, Arterial line and TEE  Intra-op Plan:   Post-operative Plan: Post-operative intubation/ventilation  Informed Consent: I have reviewed the patients History and Physical, chart, labs and discussed the procedure including the risks, benefits and alternatives for the proposed anesthesia with the patient or authorized representative who has indicated his/her understanding and acceptance.    Dental advisory given  Plan Discussed with:   Anesthesia Plan Comments:         Anesthesia Quick Evaluation

## 2016-07-12 NOTE — Anesthesia Procedure Notes (Addendum)
Central Venous Catheter Insertion Performed by: Suzette Battiest, anesthesiologist Start/End2/17/2018 12:20 PM, 07/12/2016 12:30 PM Patient location: Pre-op. Preanesthetic checklist: patient identified, IV checked, site marked, risks and benefits discussed, surgical consent, monitors and equipment checked, pre-op evaluation, timeout performed and anesthesia consent Lidocaine 1% used for infiltration and patient sedated Hand hygiene performed  and maximum sterile barriers used  Catheter size: 9 Fr MAC introducer PA Cath depth:47 Procedure performed using ultrasound guided technique. Ultrasound Notes:anatomy identified, needle tip was noted to be adjacent to the nerve/plexus identified, no ultrasound evidence of intravascular and/or intraneural injection and image(s) printed for medical record Attempts: 2 Following insertion, line sutured and dressing applied. Post procedure assessment: blood return through all ports, free fluid flow and no air  Patient tolerated the procedure well with no immediate complications.

## 2016-07-12 NOTE — Anesthesia Postprocedure Evaluation (Signed)
Anesthesia Post Note  Patient: [name]  Procedure(s) Performed: Procedure(s) (LRB): CORONARY ARTERY BYPASS GRAFTING (CABG), ON PUMP, TIMES ONE, USING LEFT INTERNAL MAMMARY ARTERY WITH TEE (N/A)  Patient location during evaluation: SICU Anesthesia Type: General Level of consciousness: sedated Pain management: pain level controlled Vital Signs Assessment: post-procedure vital signs reviewed and stable Respiratory status: patient remains intubated per anesthesia plan Cardiovascular status: stable Anesthetic complications: no       Last Vitals:  Vitals:   07/12/16 1600 07/12/16 1615  BP: 105/78   Pulse:    Resp: 17 15  Temp: 36.9 C 37 C    Last Pain:  Vitals:   07/12/16 1037  TempSrc:   PainSc: 3                  [name]

## 2016-07-12 NOTE — Progress Notes (Signed)
EKG CRITICAL VALUE     12 lead EKG performed.  Critical value noted.  Dicky Doe, RN notified.   Martie Lee, CCT 07/12/2016 4:37 PM

## 2016-07-12 NOTE — Interval H&P Note (Signed)
Cath Lab Visit (complete for each Cath Lab visit)  Clinical Evaluation Leading to the Procedure:   ACS: Yes.    Non-ACS:    Anginal Classification: CCS III  Anti-ischemic medical therapy: No Therapy  Non-Invasive Test Results: No non-invasive testing performed  Prior CABG: No previous CABG      History and Physical Interval Note:  07/12/2016 9:45 AM  [name]  has presented today for surgery, with the diagnosis of urgent  The various methods of treatment have been discussed with the patient and family. After consideration of risks, benefits and other options for treatment, the patient has consented to  Procedure(s): Left Heart Cath and Coronary Angiography (N/A) as a surgical intervention .  The patient's history has been reviewed, patient examined, no change in status, stable for surgery.  I have reviewed the patient's chart and labs.  Questions were answered to the patient's satisfaction.     Quay Burow

## 2016-07-12 NOTE — Progress Notes (Signed)
Patient ID: [name], female   DOB: 1971/02/11, 46 y.o.   MRN: [id number]   SICU Evening Rounds:   Hemodynamically stable in sinus rhythm CI = 3.2  On dop 3, neo  Weaning Precedex. Still on vent  Urine output good  CT output low  CBC    Component Value Date/Time   WBC 11.5 (H) 07/12/2016 1600   RBC 3.29 (L) 07/12/2016 1600   HGB 8.5 (L) 07/12/2016 1602   HCT 25.0 (L) 07/12/2016 1602   PLT 156 07/12/2016 1600   MCV 84.8 07/12/2016 1600   MCH 29.2 07/12/2016 1600   MCHC 34.4 07/12/2016 1600   RDW 13.2 07/12/2016 1600   LYMPHSABS 1.7 08/10/2011 2049   MONOABS 0.5 08/10/2011 2049   EOSABS 0.1 08/10/2011 2049   BASOSABS 0.0 08/10/2011 2049     BMET    Component Value Date/Time   NA 143 07/12/2016 1602   K 3.1 (L) 07/12/2016 1602   CL 106 07/12/2016 0034   CO2 23 07/12/2016 0034   GLUCOSE 144 (H) 07/12/2016 1602   BUN 11 07/12/2016 0034   CREATININE 0.64 07/12/2016 0034   CALCIUM 9.2 07/12/2016 0034   GFRNONAA >60 07/12/2016 0034   GFRAA >60 07/12/2016 0034     A/P:  Stable postop course. Continue current plans

## 2016-07-12 NOTE — Procedures (Signed)
Extubation Procedure Note  Patient Details:   Name: [name] DOB: 1970/12/11 MRN: [id number]   Airway Documentation:  Airway 7.5 mm (Active)  Secured at (cm) 20 cm 07/12/2016  4:15 PM  Measured From Lips 07/12/2016  4:15 PM  [place] 07/12/2016  4:15 PM  Secured By Pink Tape 07/12/2016  4:15 PM  Site Condition Dry 07/12/2016  4:15 PM    Evaluation  O2 sats: stable throughout Complications: No apparent complications Patient did tolerate procedure well. Bilateral Breath Sounds: Clear   Yes   NIF -20, VC 1.5L.  Patient extubated without cuff leak per Dr. Cyndia Bent.  Patient tolerated well.  Lacretia Nicks 07/12/2016, 8:04 PM

## 2016-07-12 NOTE — Consult Note (Signed)
[address]       [place] [address]             [phone]      Cardiothoracic Surgery Consultation  Reason for Consult: High grade ostial/proximal LAD stenosis with acute NSTEMI Referring Physician: Dr. Lenna Sciara. [name]  [name] is an 46 y.o. female.  HPI:   The patient has a history of hypercholesterolemia and statin intolerance and has had chest pain off and on over the past week. Thursday she had a bad episode but did not seek medical care and went to work Friday. Then last night had another severe episode and presented to [facility] ER. ECG showed anterolateral T-wave inversion with troponin of 4. Her pain resolved after admission and she was transferred to [facility] for further evaluation. This am her pain returned on IV heparin and topical nitrates and troponin was up to 7.45. She was taken to cath lab and has a 99% ostial and 95% proximal LAD stenosis. The LCX and RCA look fine. LVEF is 45-50% with anterolateral hypokinesis. She is still having a little chest pain. I was called to the cath lab to evaluate.  Past Medical History:  Diagnosis Date  . Anemia    due to heavy periods  . Anxiety   . Difficult intubation 01/22/2016  . Headache   . Heart murmur    aschild antibiotics if having Dental work  . Hypocholesteremia    medical treatment  . Menorrhagia   . Pelvic pain     Past Surgical History:  Procedure Laterality Date  . ABDOMINAL HYSTERECTOMY    . goiter     left side goiter removed  . INGUINAL HERNIA REPAIR Bilateral 01/22/2016   Procedure: LAPAROSCOPIC BILATERAL FEMORAL AND RIGHT INGUINAL HERNIA REPAIRWITH INSERTION OF MESH;  Surgeon: Michael Boston, MD;  Location: [facility]S;  Service: General;  Laterality: Bilateral;  . SVD     x 1  . WISDOM TOOTH EXTRACTION      History reviewed. No pertinent family history.  Social History:  reports that she has never smoked. She has never used smokeless tobacco. She reports that she does not drink alcohol or use  drugs.  Allergies:  Allergies  Allergen Reactions  . Acetaminophen Other (See Comments)    Causes headaches   . Bactrim Itching and Rash         Medications:  I have reviewed the patient's current medications. Prior to Admission:  Prescriptions Prior to Admission  Medication Sig Dispense Refill Last Dose  . amitriptyline (ELAVIL) 50 MG tablet Take 25 mg by mouth at bedtime.  5 07/10/2016 at Unknown time  . APPLE CIDER VINEGAR PO Take 2 tablets by mouth daily.   07/10/2016 at Unknown time  . baclofen (LIORESAL) 10 MG tablet Take 10 mg by mouth 2 (two) times daily as needed (headache). Limit to two days per week.   07/10/2016 at Unknown time  . LORazepam (ATIVAN) 0.5 MG tablet Take 0.5 mg by mouth at bedtime. And one tablet for anxiety as needed. Do not exceed 2 tablets.   07/10/2016 at Unknown time  . naproxen (NAPROSYN) 500 MG tablet Take 500 mg by mouth 2 (two) times daily.   07/10/2016 at Unknown time  . SUMAtriptan (IMITREX) 100 MG tablet Take 100 mg by mouth as directed. As needed for migraine. May repeat in 2 hours.   unknown  . zonisamide (ZONEGRAN) 100 MG capsule Take 100 mg by mouth at bedtime.  07/10/2016 at Unknown time  . traMADol (ULTRAM) 50 MG tablet Take 1-2 tablets (50-100 mg total) by mouth every 6 (six) hours as needed for moderate pain or severe pain. (Patient not taking: Reported on 07/11/2016) 30 tablet 0 Not Taking at Unknown time   Scheduled: . sodium chloride   Intravenous STAT  . [START ON 07/13/2016] aspirin  81 mg Oral Pre-Cath  . [MAR Hold] aspirin EC  81 mg Oral Daily  . [MAR Hold] atorvastatin  80 mg Oral q1800  . cefUROXime (ZINACEF)  IV  1.5 g Intravenous To OR  . cefUROXime (ZINACEF)  IV  750 mg Intravenous To OR  . dexmedetomidine  0.1-0.7 mcg/kg/hr Intravenous To OR  . DOPamine  0-10 mcg/kg/min Intravenous To OR  . epinephrine  0-10 mcg/min Intravenous To OR  . heparin-papaverine-plasmalyte irrigation   Irrigation To OR  . heparin 30,000 units/NS 1000  mL solution for CELLSAVER   Other To OR  . insulin (NOVOLIN-R) infusion   Intravenous To OR  . magnesium sulfate  40 mEq Other To OR  . [MAR Hold] metoprolol tartrate  12.5 mg Oral BID  . nitroGLYCERIN  2-200 mcg/min Intravenous To OR  . phenylephrine 47m/250mL NS (0.080mml) infusion  30-200 mcg/min Intravenous To OR  . potassium chloride  80 mEq Other To OR  . sodium chloride flush  3 mL Intravenous Q12H  . tranexamic acid (CYKLOKAPRON) infusion ([facility])  1.5 mg/kg/hr Intravenous To OR  . tranexamic acid  15 mg/kg Intravenous To OR  . tranexamic acid  2 mg/kg Intracatheter To OR  . vancomycin  1,250 mg Intravenous To OR   Continuous: . [START ON 07/13/2016] sodium chloride 3 mL/kg/hr (07/12/16 0931)   Followed by  . [START ON 07/13/2016] sodium chloride    . heparin 950 Units/hr (07/12/16 1030)  . heparin    . heparin     PRZCH:YIFOYDhloride, [MAR Hold] acetaminophen, heparin, heparin, lidocaine (PF), midazolam, [MAR Hold] nitroGLYCERIN, [MAR Hold] ondansetron (ZOFRAN) IV, ondansetron, Radial Cocktail (Verapamil 5 mg, NTG, Lidocaine), Radial Cocktail/Verapamil only, sodium chloride flush  Results for orders placed or performed during the hospital encounter of 07/11/16 (from the past 48 hour(s))  I-stat troponin, ED     Status: Abnormal   Collection Time: 07/11/16  8:25 PM  Result Value Ref Range   Troponin i, poc 5.47 (HH) 0.00 - 0.08 ng/mL   Comment NOTIFIED PHYSICIAN    Comment 3            Comment: Due to the release kinetics of cTnI, a negative result within the first hours of the onset of symptoms does not rule out myocardial infarction with certainty. If myocardial infarction is still suspected, repeat the test at appropriate intervals.   Basic metabolic panel     Status: Abnormal   Collection Time: 07/11/16  8:45 PM  Result Value Ref Range   Sodium 137 135 - 145 mmol/L   Potassium 3.5 3.5 - 5.1 mmol/L   Chloride 106 101 - 111 mmol/L   CO2 23 22 - 32 mmol/L   Glucose,  Bld 165 (H) 65 - 99 mg/dL   BUN 16 6 - 20 mg/dL   Creatinine, Ser 0.64 0.44 - 1.00 mg/dL   Calcium 9.6 8.9 - 10.3 mg/dL   GFR calc non Af Amer >60 >60 mL/min   GFR calc Af Amer >60 >60 mL/min    Comment: (NOTE) The eGFR has been calculated using the CKD EPI equation. This calculation has not been validated  in all clinical situations. eGFR's persistently <60 mL/min signify possible Chronic Kidney Disease.    Anion gap 8 5 - 15  CBC     Status: None   Collection Time: 07/11/16  8:45 PM  Result Value Ref Range   WBC 8.6 4.0 - 10.5 K/uL   RBC 4.83 3.87 - 5.11 MIL/uL   Hemoglobin 14.0 12.0 - 15.0 g/dL   HCT 39.6 36.0 - 46.0 %   MCV 82.0 78.0 - 100.0 fL   MCH 29.0 26.0 - 34.0 pg   MCHC 35.4 30.0 - 36.0 g/dL   RDW 13.1 11.5 - 15.5 %   Platelets 238 150 - 400 K/uL  Troponin I     Status: Abnormal   Collection Time: 07/11/16  8:45 PM  Result Value Ref Range   Troponin I 4.53 (HH) <0.03 ng/mL    Comment: CRITICAL RESULT CALLED TO, READ BACK BY AND VERIFIED WITHAlanson Aly RN [phone] [place],N   Lipase, blood     Status: None   Collection Time: 07/11/16  8:45 PM  Result Value Ref Range   Lipase 49 11 - 51 U/L  Hepatic function panel     Status: Abnormal   Collection Time: 07/11/16  8:45 PM  Result Value Ref Range   Total Protein 8.7 (H) 6.5 - 8.1 g/dL   Albumin 4.8 3.5 - 5.0 g/dL   AST 68 (H) 15 - 41 U/L   ALT 69 (H) 14 - 54 U/L   Alkaline Phosphatase 56 38 - 126 U/L   Total Bilirubin 1.2 0.3 - 1.2 mg/dL   Bilirubin, Direct <0.1 (L) 0.1 - 0.5 mg/dL   Indirect Bilirubin NOT CALCULATED 0.3 - 0.9 mg/dL  Protime-INR     Status: None   Collection Time: 07/11/16  8:45 PM  Result Value Ref Range   Prothrombin Time 12.5 11.4 - 15.2 seconds   INR 0.93   APTT     Status: None   Collection Time: 07/11/16  8:45 PM  Result Value Ref Range   aPTT 31 24 - 36 seconds  MRSA PCR Screening     Status: None   Collection Time: 07/11/16 11:01 PM  Result Value Ref Range   MRSA by  PCR NEGATIVE NEGATIVE    Comment:        The GeneXpert MRSA Assay (FDA approved for NASAL specimens only), is one component of a comprehensive MRSA colonization surveillance program. It is not intended to diagnose MRSA infection nor to guide or monitor treatment for MRSA infections.   TSH     Status: None   Collection Time: 07/12/16 12:34 AM  Result Value Ref Range   TSH 1.146 0.350 - 4.500 uIU/mL    Comment: Performed by a 3rd Generation assay with a functional sensitivity of <=0.01 uIU/mL.  T4, free     Status: None   Collection Time: 07/12/16 12:34 AM  Result Value Ref Range   Free T4 0.90 0.61 - 1.12 ng/dL    Comment: (NOTE) Biotin ingestion may interfere with free T4 tests. If the results are inconsistent with the TSH level, previous test results, or the clinical presentation, then consider biotin interference. If needed, order repeat testing after stopping biotin.   Basic metabolic panel     Status: Abnormal   Collection Time: 07/12/16 12:34 AM  Result Value Ref Range   Sodium 139 135 - 145 mmol/L   Potassium 3.6 3.5 - 5.1 mmol/L   Chloride 106 101 - 111 mmol/L  CO2 23 22 - 32 mmol/L   Glucose, Bld 146 (H) 65 - 99 mg/dL   BUN 11 6 - 20 mg/dL   Creatinine, Ser 0.64 0.44 - 1.00 mg/dL   Calcium 9.2 8.9 - 10.3 mg/dL   GFR calc non Af Amer >60 >60 mL/min   GFR calc Af Amer >60 >60 mL/min    Comment: (NOTE) The eGFR has been calculated using the CKD EPI equation. This calculation has not been validated in all clinical situations. eGFR's persistently <60 mL/min signify possible Chronic Kidney Disease.    Anion gap 10 5 - 15  Troponin I     Status: Abnormal   Collection Time: 07/12/16 12:34 AM  Result Value Ref Range   Troponin I 7.45 (HH) <0.03 ng/mL    Comment: CRITICAL RESULT CALLED TO, READ BACK BY AND VERIFIED WITH: BARNHILL S,RN 07/12/16 0124 WAYK   Brain natriuretic peptide     Status: Abnormal   Collection Time: 07/12/16 12:35 AM  Result Value Ref Range     B Natriuretic Peptide 234.8 (H) 0.0 - 100.0 pg/mL  Heparin level (unfractionated)     Status: Abnormal   Collection Time: 07/12/16  5:36 AM  Result Value Ref Range   Heparin Unfractionated 0.14 (L) 0.30 - 0.70 IU/mL    Comment:        IF HEPARIN RESULTS ARE BELOW EXPECTED VALUES, AND PATIENT DOSAGE HAS BEEN CONFIRMED, SUGGEST FOLLOW UP TESTING OF ANTITHROMBIN III LEVELS.     Dg Chest 2 View  Result Date: 07/11/2016 CLINICAL DATA:  Left upper quadrant chest pain extending into the left axillary region, shoulder and neck for 2 weeks. Worsening symptoms today. EXAM: CHEST  2 VIEW COMPARISON:  Portable chest 04/05/2008. FINDINGS: The heart size and mediastinal contours are normal. The lungs are clear. There is no pleural effusion or pneumothorax. No acute osseous findings are identified. Mild thoracic spine degenerative changes are noted. Telemetry leads overlie the chest. There are surgical clips in the right paratracheal region. IMPRESSION: No active cardiopulmonary process. Electronically Signed   By: Richardean Sale M.D.   On: 07/11/2016 20:18    Review of Systems  Constitutional: Positive for malaise/fatigue.  Respiratory: Positive for shortness of breath.   Cardiovascular: Positive for chest pain. Negative for palpitations, orthopnea, leg swelling and PND.  Gastrointestinal: Negative.   Genitourinary: Negative.   Musculoskeletal: Negative.   Neurological: Negative.   Psychiatric/Behavioral: Negative.    Blood pressure 100/66, pulse 80, temperature 98.5 F (36.9 C), temperature source Oral, resp. rate 17, height _0  (1.499 m), weight 58 kg (127 lb 12.8 oz), last menstrual period 08/10/2011, SpO2 98 %. Physical Exam  Constitutional: She is oriented to person, place, and time. She appears well-developed and well-nourished.  Eyes: EOM are normal. Pupils are equal, round, and reactive to light.  Cardiovascular: Normal rate, regular rhythm and normal heart sounds.   No murmur  heard. Respiratory: Effort normal and breath sounds normal. No respiratory distress. She has no rales.  Neurological: She is alert and oriented to person, place, and time.  Skin: Skin is warm and dry.  Psychiatric: She has a normal mood and affect.   Klare Criss  Cardiac catheterization  Order# [id number]  Reading physician: Lorretta Harp, MD Ordering physician: Lorretta Harp, MD Study date: 07/12/16  Physicians   Panel Physicians Referring Physician Case Authorizing Physician  Lorretta Harp, MD (Primary)    Procedures   Left Heart Cath and Coronary Angiography  Conclusion  LM lesion, 99 %stenosed.  Ost LAD to Prox LAD lesion, 95 %stenosed.  There is mild to moderate left ventricular systolic dysfunction.  LV end diastolic pressure is severely elevated.  The left ventricular ejection fraction is 45-50% by visual estimate.   [name] is a 46 y.o. female    [id number] LOCATION:  FACILITY: [facility]  PHYSICIAN: Quay Burow, M.D. 05/28/1970   DATE OF PROCEDURE:  07/12/2016  DATE OF DISCHARGE:     CARDIAC CATHETERIZATION     History obtained from chart review. [name] is a 46 year old female married without prior cardiac history or risk factors. She had bilateral inguinal hernia repair in August of last year. She developed chest pain over the last week has been off and on, more pronounced over the last 24 hours. She presented to [facility] where she was found to have anterolateral T-wave inversion. She was heparinized and transferred to [facility]. Her initial troponin was 4 increased to 7 overnight. She was pain-free last night but developed recurrent chest pain this morning on IV heparin and topical nitrates. It was elected to bring her to the cath lab to define her anatomy.   PROCEDURE DESCRIPTION:   The patient was brought to the second floor [facility] cath lab in the postabsorptive state. She was not premedicated  . Her right wrist was prepped and shaved in usual sterile fashion. Xylocaine 1% was used for local anesthesia. A 6 French sheath was inserted into the right radial  artery using standard Seldinger technique. The patient received 3000 units  of heparin  intravenously.  5 [place] TIG catheter and pigtail catheters were used for selective coronary angiography and left ventriculography respectively. Isovue dye was used for the entirety of the case. Retrograde aortic, left ventricular and pullback pressures were recorded. The patient received radial cocktail via the SideArm sheath.    IMPRESSION: Ms. Harl has 99% ostial LAD off the left main with a sequential 95% proximal LAD. She has no other significant coronary artery disease. She does have an anteroapical wall motion around the EF in the 45-50% range. She'll need urgent coronary artery bypass grafting with a LIMA to LAD. Dr. Cyndia Bent has reviewed the Films and spoke with the patient and family. The sheath was removed and a TR band was placed on the right wrist to achieve complete hemostasis. Heparin was restarted. The patient will be leaving leaving the cath lab shortly for coronary artery bypass grafting.  Quay Burow. MD, [facility] 07/12/2016 10:25 AM      Indications   Non-STEMI (non-ST elevated myocardial infarction) ([place]) [I21.4 (ICD-10-CM)]  Procedural Details/Technique   Technical Details Estimated blood loss <50 mL.  During this procedure the patient was administered the following to achieve and maintain moderate conscious sedation: Versed 1 mg, while the patient's heart rate, blood pressure, and oxygen saturation were continuously monitored. The period of conscious sedation was 0 minutes, of which I was present face-to-face 100% of this time.    Coronary Findings   Dominance: Right  Left Main  LM lesion, 99% stenosed.  Left Anterior Descending  Ost LAD to Prox LAD lesion, 95% stenosed.  Wall Motion              Left  Heart   Left Ventricle The left ventricular size is normal. There is mild to moderate left ventricular systolic dysfunction. LV end diastolic pressure is severely elevated. The left ventricular ejection fraction is 45-50% by visual estimate. There are LV function abnormalities. There  was moderate anteroapical hypokinesia    Coronary Diagrams   Diagnostic Diagram      Assessment/Plan:  She has a high grade ostial and proximal LAD stenosis with acute NSTEMI and stuttering chest pain o IV heparin, NTG. This will require emergent CABG to the LAD. I discussed the operative procedure with the patient, her husband and her brother-in-law in [place] by conference call who is an anesthesiologist including alternatives, benefits and risks; including but not limited to bleeding, blood transfusion, infection, stroke, myocardial infarction, graft failure, heart block requiring a permanent pacemaker, organ dysfunction, and death.  [name] understands and agrees to proceed. OR is getting prepared.  Gaye Pollack, MD 07/12/2016, 10:54 AM

## 2016-07-12 NOTE — H&P (View-Only) (Signed)
    Subjective:  Still having some SSCP   Objective:  Vitals:   07/11/16 2316 07/12/16 0435 07/12/16 0600 07/12/16 0751  BP: (!) 144/96 95/64 112/75 100/66  Pulse:  82 87 80  Resp: 16 18  17   Temp: 98.2 F (36.8 C) 98.7 F (37.1 C)  98.5 F (36.9 C)  TempSrc:  Oral  Oral  SpO2: 95% 98% 98% 98%  Weight:  127 lb 12.8 oz (58 kg)    Height:        Intake/Output from previous day: No intake or output data in the 24 hours ending 07/12/16 0855  Physical Exam: Affect appropriate Healthy:  appears stated age HEENT: normal Neck supple with no adenopathy JVP normal no bruits no thyromegaly Lungs clear with no wheezing and good diaphragmatic motion Heart:  S1/S2 SEM  murmur, no rub, gallop or click PMI normal Abdomen: benighn, BS positve, no tenderness, no AAA no bruit.  No HSM or HJR Distal pulses intact with no bruits No edema Neuro non-focal Skin warm and dry No muscular weakness   Lab Results: Basic Metabolic Panel:  Recent Labs  07/11/16 2045 07/12/16 0034  NA 137 139  K 3.5 3.6  CL 106 106  CO2 23 23  GLUCOSE 165* 146*  BUN 16 11  CREATININE 0.64 0.64  CALCIUM 9.6 9.2   Liver Function Tests:  Recent Labs  07/11/16 2045  AST 68*  ALT 69*  ALKPHOS 56  BILITOT 1.2  PROT 8.7*  ALBUMIN 4.8    Recent Labs  07/11/16 2045  LIPASE 49   CBC:  Recent Labs  07/11/16 2045  WBC 8.6  HGB 14.0  HCT 39.6  MCV 82.0  PLT 238   Cardiac Enzymes:  Recent Labs  07/11/16 2045 07/12/16 0034  TROPONINI 4.53* 7.45*     Recent Labs  07/12/16 0034  TSH 1.146    Imaging: Dg Chest 2 View  Result Date: 07/11/2016 CLINICAL DATA:  Left upper quadrant chest pain extending into the left axillary region, shoulder and neck for 2 weeks. Worsening symptoms today. EXAM: CHEST  2 VIEW COMPARISON:  Portable chest 04/05/2008. FINDINGS: The heart size and mediastinal contours are normal. The lungs are clear. There is no pleural effusion or pneumothorax. No acute  osseous findings are identified. Mild thoracic spine degenerative changes are noted. Telemetry leads overlie the chest. There are surgical clips in the right paratracheal region. IMPRESSION: No active cardiopulmonary process. Electronically Signed   By: Richardean Sale M.D.   On: 07/11/2016 20:18    Cardiac Studies:  ECG: marked anterior T wave inversions    Telemetry: SR no VT   Echo:   Medications:   . sodium chloride   Intravenous STAT  . aspirin EC  81 mg Oral Daily  . atorvastatin  80 mg Oral q1800  . metoprolol tartrate  12.5 mg Oral BID     . heparin 950 Units/hr (07/12/16 [id number])    Assessment/Plan:  SEMI:  Chest pain this am troponin now over 7 called Dr [name] interventionalist on call Favor diagnostic Cath this am discussed with patient willing to proceed Husband coming in Continue ASA/Beta Blocker And heparin F/U echo for her SEM   Jenkins Rouge 07/12/2016, 8:55 AM

## 2016-07-13 ENCOUNTER — Other Ambulatory Visit (HOSPITAL_COMMUNITY): Payer: Federal, State, Local not specified - PPO

## 2016-07-13 ENCOUNTER — Inpatient Hospital Stay (HOSPITAL_COMMUNITY): Payer: Federal, State, Local not specified - PPO

## 2016-07-13 LAB — HIV ANTIBODY (ROUTINE TESTING W REFLEX): HIV Screen 4th Generation wRfx: NONREACTIVE

## 2016-07-13 LAB — CBC
HCT: 27.5 % — ABNORMAL LOW (ref 36.0–46.0)
HCT: 26.1 % — ABNORMAL LOW (ref 36.0–46.0)
Hemoglobin: 9.4 g/dL — ABNORMAL LOW (ref 12.0–15.0)
Hemoglobin: 8.7 g/dL — ABNORMAL LOW (ref 12.0–15.0)
MCH: 29.3 pg (ref 26.0–34.0)
MCH: 28.9 pg (ref 26.0–34.0)
MCHC: 34.2 g/dL (ref 30.0–36.0)
MCHC: 33.3 g/dL (ref 30.0–36.0)
MCV: 85.7 fL (ref 78.0–100.0)
MCV: 86.7 fL (ref 78.0–100.0)
PLATELETS: 169 10*3/uL (ref 150–400)
PLATELETS: 147 10*3/uL — AB (ref 150–400)
RBC: 3.21 MIL/uL — ABNORMAL LOW (ref 3.87–5.11)
RBC: 3.01 MIL/uL — AB (ref 3.87–5.11)
RDW: 13.8 % (ref 11.5–15.5)
RDW: 14 % (ref 11.5–15.5)
WBC: 13.5 10*3/uL — ABNORMAL HIGH (ref 4.0–10.5)
WBC: 13.1 10*3/uL — ABNORMAL HIGH (ref 4.0–10.5)

## 2016-07-13 LAB — CREATININE, SERUM
Creatinine, Ser: 0.93 mg/dL (ref 0.44–1.00)
GFR calc Af Amer: >60 mL/min (ref >60)
GFR calc non Af Amer: >60 mL/min (ref >60)

## 2016-07-13 LAB — BASIC METABOLIC PANEL
Anion gap: 10 (ref 5–15)
BUN: 9 mg/dL (ref 6–20)
CALCIUM: 8 mg/dL — AB (ref 8.9–10.3)
CO2: 20 mmol/L — ABNORMAL LOW (ref 22–32)
Chloride: 110 mmol/L (ref 101–111)
Creatinine, Ser: 0.58 mg/dL (ref 0.44–1.00)
GFR calc Af Amer: >60 mL/min (ref >60)
GLUCOSE: 131 mg/dL — AB (ref 65–99)
Potassium: 3.6 mmol/L (ref 3.5–5.1)
Sodium: 140 mmol/L (ref 135–145)

## 2016-07-13 LAB — MAGNESIUM
Magnesium: 2.4 mg/dL (ref 1.7–2.4)
Magnesium: 2.5 mg/dL — ABNORMAL HIGH (ref 1.7–2.4)

## 2016-07-13 LAB — GLUCOSE, CAPILLARY
GLUCOSE-CAPILLARY: 100 mg/dL — AB (ref 65–99)
GLUCOSE-CAPILLARY: 101 mg/dL — AB (ref 65–99)
GLUCOSE-CAPILLARY: 107 mg/dL — AB (ref 65–99)
GLUCOSE-CAPILLARY: 133 mg/dL — AB (ref 65–99)
GLUCOSE-CAPILLARY: 162 mg/dL — AB (ref 65–99)
GLUCOSE-CAPILLARY: 172 mg/dL — AB (ref 65–99)
GLUCOSE-CAPILLARY: 93 mg/dL (ref 65–99)
Glucose-Capillary: 100 mg/dL — ABNORMAL HIGH (ref 65–99)
Glucose-Capillary: 94 mg/dL (ref 65–99)
Glucose-Capillary: 122 mg/dL — ABNORMAL HIGH (ref 65–99)
Glucose-Capillary: 109 mg/dL — ABNORMAL HIGH (ref 65–99)
Glucose-Capillary: 111 mg/dL — ABNORMAL HIGH (ref 65–99)
Glucose-Capillary: 93 mg/dL (ref 65–99)
Glucose-Capillary: 97 mg/dL (ref 65–99)
Glucose-Capillary: 113 mg/dL — ABNORMAL HIGH (ref 65–99)

## 2016-07-13 LAB — POCT I-STAT, CHEM 8
BUN: 19 mg/dL (ref 6–20)
CALCIUM ION: 1.18 mmol/L (ref 1.15–1.40)
CHLORIDE: 110 mmol/L (ref 101–111)
Creatinine, Ser: 0.7 mg/dL (ref 0.44–1.00)
Glucose, Bld: 142 mg/dL — ABNORMAL HIGH (ref 65–99)
HEMATOCRIT: 26 % — AB (ref 36.0–46.0)
Hemoglobin: 8.8 g/dL — ABNORMAL LOW (ref 12.0–15.0)
POTASSIUM: 3.6 mmol/L (ref 3.5–5.1)
SODIUM: 144 mmol/L (ref 135–145)
TCO2: 20 mmol/L (ref 0–100)

## 2016-07-13 LAB — HEMOGLOBIN A1C
Hgb A1c MFr Bld: 6.5 % — ABNORMAL HIGH (ref 4.8–5.6)
Mean Plasma Glucose: 140 mg/dL

## 2016-07-13 MED ORDER — ENOXAPARIN SODIUM 40 MG/0.4ML ~~LOC~~ SOLN
40.0000 mg | Freq: Every day | SUBCUTANEOUS | Status: DC
  Administered 2016-07-13 – 2016-07-16 (×4): 40 mg via SUBCUTANEOUS
  Filled 2016-07-13 (×4): qty 0.4

## 2016-07-13 MED ORDER — SODIUM CHLORIDE 0.9 % IV SOLN
30.0000 meq | Freq: Once | INTRAVENOUS | Status: AC
  Administered 2016-07-13: 30 meq via INTRAVENOUS
  Filled 2016-07-13: qty 15

## 2016-07-13 MED ORDER — INSULIN ASPART 100 UNIT/ML ~~LOC~~ SOLN
0.0000 [IU] | SUBCUTANEOUS | Status: DC
  Administered 2016-07-13: 2 [IU] via SUBCUTANEOUS
  Administered 2016-07-13 – 2016-07-14 (×2): 4 [IU] via SUBCUTANEOUS

## 2016-07-13 MED ORDER — AMITRIPTYLINE HCL 50 MG PO TABS
25.0000 mg | ORAL_TABLET | Freq: Every day | ORAL | Status: DC
  Administered 2016-07-13 – 2016-07-16 (×4): 25 mg via ORAL
  Filled 2016-07-13 (×4): qty 1

## 2016-07-13 MED ORDER — INSULIN DETEMIR 100 UNIT/ML ~~LOC~~ SOLN
15.0000 [IU] | Freq: Once | SUBCUTANEOUS | Status: AC
  Administered 2016-07-13: 15 [IU] via SUBCUTANEOUS
  Filled 2016-07-13: qty 0.15

## 2016-07-13 MED ORDER — FUROSEMIDE 10 MG/ML IJ SOLN
20.0000 mg | Freq: Two times a day (BID) | INTRAMUSCULAR | Status: DC
  Administered 2016-07-13: 20 mg via INTRAVENOUS
  Filled 2016-07-13: qty 2

## 2016-07-13 MED ORDER — METOPROLOL TARTRATE 12.5 MG HALF TABLET
12.5000 mg | ORAL_TABLET | Freq: Two times a day (BID) | ORAL | Status: DC
  Administered 2016-07-13: 12.5 mg via ORAL
  Filled 2016-07-13 (×2): qty 1

## 2016-07-13 MED ORDER — ZONISAMIDE 100 MG PO CAPS
100.0000 mg | ORAL_CAPSULE | Freq: Every day | ORAL | Status: DC
  Administered 2016-07-13 – 2016-07-16 (×4): 100 mg via ORAL
  Filled 2016-07-13 (×4): qty 1

## 2016-07-13 MED ORDER — SODIUM CHLORIDE 0.45 % IV SOLN: INTRAVENOUS | Status: DC

## 2016-07-13 MED ORDER — ISOSORBIDE MONONITRATE ER 30 MG PO TB24
30.0000 mg | ORAL_TABLET | Freq: Every day | ORAL | Status: DC
  Administered 2016-07-13 – 2016-07-16 (×4): 30 mg via ORAL
  Filled 2016-07-13 (×4): qty 1

## 2016-07-13 NOTE — Progress Notes (Signed)
Patient ID: [name], female   DOB: 03-Dec-1970, 46 y.o.   MRN: [id number]  SICU Evening Rounds:   Hemodynamically stable    Urine output good    CBC    Component Value Date/Time   WBC 13.1 (H) 07/13/2016 1656   RBC 3.01 (L) 07/13/2016 1656   HGB 8.7 (L) 07/13/2016 1656   HCT 26.1 (L) 07/13/2016 1656   PLT 147 (L) 07/13/2016 1656   MCV 86.7 07/13/2016 1656   MCH 28.9 07/13/2016 1656   MCHC 33.3 07/13/2016 1656   RDW 14.0 07/13/2016 1656   LYMPHSABS 1.7 08/10/2011 2049   MONOABS 0.5 08/10/2011 2049   EOSABS 0.1 08/10/2011 2049   BASOSABS 0.0 08/10/2011 2049     BMET    Component Value Date/Time   NA 144 07/13/2016 1643   K 3.6 07/13/2016 1643   CL 110 07/13/2016 1643   CO2 20 (L) 07/13/2016 0448   GLUCOSE 142 (H) 07/13/2016 1643   BUN 19 07/13/2016 1643   CREATININE 0.93 07/13/2016 1656   CALCIUM 8.0 (L) 07/13/2016 0448   GFRNONAA >60 07/13/2016 1656   GFRAA >60 07/13/2016 1656     A/P:  Stable postop course. Continue current plans

## 2016-07-13 NOTE — Progress Notes (Signed)
1 Day Post-Op Procedure(s) (LRB): CORONARY ARTERY BYPASS GRAFTING (CABG), ON PUMP, TIMES ONE, USING LEFT INTERNAL MAMMARY ARTERY WITH TEE (N/A) Subjective:  Sore  Objective: Vital signs in last 24 hours: Temp:  [98.4 F (36.9 C)-100.6 F (38.1 C)] 100.2 F (37.9 C) (02/18 0700) Pulse Rate:  [72-100] 87 (02/17 1145) Cardiac Rhythm: Sinus tachycardia (02/18 0700) Resp:  [8-35] 23 (02/18 0700) BP: (92-152)/(66-109) 102/72 (02/18 0700) SpO2:  [0 %-100 %] 96 % (02/18 0700) Arterial Line BP: (87-111)/(53-69) 99/62 (02/18 0700) FiO2 (%):  [40 %-50 %] 40 % (02/17 1926) Weight:  [61.3 kg (135 lb 2.3 oz)] 61.3 kg (135 lb 2.3 oz) (02/18 0500)  Hemodynamic parameters for last 24 hours: PAP: (20-27)/(10-18) 25/14 CO:  [3.6 L/min-4.8 L/min] 4.1 L/min CI:  [2.4 L/min/m2-3.2 L/min/m2] 2.7 L/min/m2  Intake/Output from previous day: 02/17 0701 - 02/18 0700 In: 5907.1 [I.V.:3982.1; Blood:150; NG/GT:30; IV Piggyback:1745] Out: [id number] [Urine:3515; Blood:1300; Chest Tube:240] Intake/Output this shift: No intake/output data recorded.  General appearance: awake, cooperative, edematous Neurologic: intact Heart: regular rate and rhythm, S1, S2 normal, no murmur, rub present Lungs: clear to auscultation bilaterally Extremities: edema mild edema Wound: dressing dry  Lab Results:  Recent Labs  07/12/16 2157 07/12/16 2201 07/13/16 0448  WBC 16.2*  --  13.5*  HGB 9.9* 9.2* 9.4*  HCT 29.0* 27.0* 27.5*  PLT 190  --  169   BMET:  Recent Labs  07/12/16 0034  07/12/16 2201 07/13/16 0448  NA 139  < > 145 140  K 3.6  < > 3.2* 3.6  CL 106  --  111 110  CO2 23  --   --  20*  GLUCOSE 146*  < > 152* 131*  BUN 11  --  8 9  CREATININE 0.64  < > 0.40* 0.58  CALCIUM 9.2  --   --  8.0*  < > = values in this interval not displayed.  PT/INR:  Recent Labs  07/12/16 1600  LABPROT 15.4*  INR 1.21   ABG    Component Value Date/Time   PHART 7.376 07/12/2016 2104   HCO3 21.6 07/12/2016 2104   TCO2 22 07/12/2016 2201   ACIDBASEDEF 3.0 (H) 07/12/2016 2104   O2SAT 98.0 07/12/2016 2104   CBG (last 3)   Recent Labs  07/13/16 0409 07/13/16 0500 07/13/16 0610  GLUCAP 133* 122* 109*   CXR: ok  ECG: anterior MI, ST-T changes resolved.  Assessment/Plan: S/P Procedure(s) (LRB): CORONARY ARTERY BYPASS GRAFTING (CABG), ON PUMP, TIMES ONE, USING LEFT INTERNAL MAMMARY ARTERY WITH TEE (N/A)  She is hemodynamically stable in sinus tachy rhythm Wean off dopamine and neo Will put on Imdur for a month to prevent IMA spasm since it appeared spastic with handling the vessel. Hold beta blocker until stable off dopamine and neo. Diurese later DC chest tubes, arterial line and swan OOB, IS Will give her a dose of Levemir this am to get off insulin drip and follow CBG's    LOS: 2 days    [name] 07/13/2016

## 2016-07-13 NOTE — Progress Notes (Signed)
Spoke with Dr. Cyndia Bent regarding pre-extubation ABG results, and made him aware that she does not have a cuff leak. Order received to give 1 amp sodium bicarb, and extubate. Also made aware of burst of SVT with heart rate up to the 160s. May wean Dopamine if needed. Will continue to closely monitor. Richarda Blade RN

## 2016-07-13 NOTE — Progress Notes (Signed)
Subjective:  Somewhat sleepy but no c/o SOB.  Tolerated emergency CABG OK.  Objective:  Vital Signs in the last 24 hours: BP 106/72   Pulse 87   Temp (!) 100.4 F (38 C)   Resp (!) 29   Ht 4\' 11"  (1.499 m)   Wt 61.3 kg (135 lb 2.3 oz)   LMP 08/10/2011   SpO2 97%   BMI 27.30 kg/m   Physical Exam: Seepy Hispanic female in NAD Lungs: Reduced BS bases Cardiac:  Regular rhythm, normal S1 and S2, no S3 Extremities:  No edema present  Intake/Output from previous day: 02/17 0701 - 02/18 0700 In: 5927.1 [I.V.:4002.1; Blood:150; NG/GT:30; IV Piggyback:1745] Out: [id number] [Urine:3515; Blood:1300; Chest Tube:240]  Weight Filed Weights   07/11/16 2214 07/12/16 0435 07/13/16 0500  Weight: 57 kg (125 lb 11.2 oz) 58 kg (127 lb 12.8 oz) 61.3 kg (135 lb 2.3 oz)    Lab Results: Basic Metabolic Panel:  Recent Labs  07/12/16 0034  07/12/16 2201 07/13/16 0448  NA 139  < > 145 140  K 3.6  < > 3.2* 3.6  CL 106  --  111 110  CO2 23  --   --  20*  GLUCOSE 146*  < > 152* 131*  BUN 11  --  8 9  CREATININE 0.64  < > 0.40* 0.58  < > = values in this interval not displayed. CBC:  Recent Labs  07/12/16 2157 07/12/16 2201 07/13/16 0448  WBC 16.2*  --  13.5*  HGB 9.9* 9.2* 9.4*  HCT 29.0* 27.0* 27.5*  MCV 84.8  --  85.7  PLT 190  --  169   Cardiac Enzymes: Troponin (Point of Care Test)  Recent Labs  07/11/16 2025  TROPIPOC 5.47*   Cardiac Panel (last 3 results)  Recent Labs  07/11/16 2045 07/12/16 0034  TROPONINI 4.53* 7.45*    Telemetry: Personally reviewed.  Sinus tachycardia  Assessment/Plan:  1. Recent anterolateral MI with emergency CABG  EKG today shows anterolateral MI wit;h loss of R waves on EKG 2. CAD  Rec:  Stable early on but requiring pressors. Once off of them start beta blocker.  Get ECHO prior to discharge.     Kerry Hough  MD [facility]  07/13/2016, 8:52 AM

## 2016-07-14 ENCOUNTER — Inpatient Hospital Stay (HOSPITAL_COMMUNITY): Payer: Federal, State, Local not specified - PPO

## 2016-07-14 ENCOUNTER — Encounter (HOSPITAL_COMMUNITY): Payer: Self-pay | Admitting: Cardiovascular Disease

## 2016-07-14 DIAGNOSIS — Z951 Presence of aortocoronary bypass graft: Secondary | ICD-10-CM

## 2016-07-14 LAB — GLUCOSE, CAPILLARY
GLUCOSE-CAPILLARY: 102 mg/dL — AB (ref 65–99)
GLUCOSE-CAPILLARY: 163 mg/dL — AB (ref 65–99)
Glucose-Capillary: 141 mg/dL — ABNORMAL HIGH (ref 65–99)
Glucose-Capillary: 148 mg/dL — ABNORMAL HIGH (ref 65–99)
Glucose-Capillary: 207 mg/dL — ABNORMAL HIGH (ref 65–99)

## 2016-07-14 LAB — CBC
HCT: 24.2 % — ABNORMAL LOW (ref 36.0–46.0)
Hemoglobin: 8 g/dL — ABNORMAL LOW (ref 12.0–15.0)
MCH: 29 pg (ref 26.0–34.0)
MCHC: 33.1 g/dL (ref 30.0–36.0)
MCV: 87.7 fL (ref 78.0–100.0)
PLATELETS: 152 10*3/uL (ref 150–400)
RBC: 2.76 MIL/uL — AB (ref 3.87–5.11)
RDW: 14.4 % (ref 11.5–15.5)
WBC: 12.8 10*3/uL — ABNORMAL HIGH (ref 4.0–10.5)

## 2016-07-14 LAB — BASIC METABOLIC PANEL
Anion gap: 7 (ref 5–15)
BUN: 23 mg/dL — AB (ref 6–20)
CALCIUM: 8.3 mg/dL — AB (ref 8.9–10.3)
CO2: 23 mmol/L (ref 22–32)
CREATININE: 0.74 mg/dL (ref 0.44–1.00)
Chloride: 108 mmol/L (ref 101–111)
GFR calc Af Amer: >60 mL/min (ref >60)
GLUCOSE: 146 mg/dL — AB (ref 65–99)
Potassium: 4.4 mmol/L (ref 3.5–5.1)
SODIUM: 138 mmol/L (ref 135–145)

## 2016-07-14 MED ORDER — POTASSIUM CHLORIDE CRYS ER 20 MEQ PO TBCR
20.0000 meq | EXTENDED_RELEASE_TABLET | Freq: Every day | ORAL | Status: AC
  Administered 2016-07-15 – 2016-07-17 (×3): 20 meq via ORAL
  Filled 2016-07-14 (×3): qty 1

## 2016-07-14 MED ORDER — TRAMADOL HCL 50 MG PO TABS
50.0000 mg | ORAL_TABLET | ORAL | Status: DC | PRN
  Administered 2016-07-14 – 2016-07-15 (×2): 100 mg via ORAL
  Filled 2016-07-14 (×2): qty 2

## 2016-07-14 MED ORDER — FUROSEMIDE 10 MG/ML IJ SOLN
40.0000 mg | Freq: Once | INTRAMUSCULAR | Status: AC
  Administered 2016-07-14: 40 mg via INTRAVENOUS
  Filled 2016-07-14: qty 4

## 2016-07-14 MED ORDER — DOCUSATE SODIUM 100 MG PO CAPS
200.0000 mg | ORAL_CAPSULE | Freq: Every day | ORAL | Status: DC
  Administered 2016-07-14 – 2016-07-17 (×4): 200 mg via ORAL
  Filled 2016-07-14 (×3): qty 2

## 2016-07-14 MED ORDER — SODIUM CHLORIDE 0.9% FLUSH: 3.0000 mL | INTRAVENOUS | Status: DC | PRN

## 2016-07-14 MED ORDER — FUROSEMIDE 40 MG PO TABS
40.0000 mg | ORAL_TABLET | Freq: Every day | ORAL | Status: AC
  Administered 2016-07-15 – 2016-07-17 (×3): 40 mg via ORAL
  Filled 2016-07-14 (×3): qty 1

## 2016-07-14 MED ORDER — ONDANSETRON HCL 4 MG PO TABS: 4.0000 mg | ORAL_TABLET | Freq: Four times a day (QID) | ORAL | Status: DC | PRN

## 2016-07-14 MED ORDER — BISACODYL 5 MG PO TBEC
10.0000 mg | DELAYED_RELEASE_TABLET | Freq: Every day | ORAL | Status: DC | PRN
  Filled 2016-07-14: qty 2

## 2016-07-14 MED ORDER — SODIUM CHLORIDE 0.9% FLUSH
3.0000 mL | Freq: Two times a day (BID) | INTRAVENOUS | Status: DC
  Administered 2016-07-14: 10 mL via INTRAVENOUS
  Administered 2016-07-14 – 2016-07-16 (×5): 3 mL via INTRAVENOUS

## 2016-07-14 MED ORDER — METOPROLOL TARTRATE 12.5 MG HALF TABLET
12.5000 mg | ORAL_TABLET | Freq: Two times a day (BID) | ORAL | Status: DC
  Administered 2016-07-14 – 2016-07-17 (×7): 12.5 mg via ORAL
  Filled 2016-07-14 (×6): qty 1

## 2016-07-14 MED ORDER — ASPIRIN EC 325 MG PO TBEC
325.0000 mg | DELAYED_RELEASE_TABLET | Freq: Every day | ORAL | Status: DC
  Administered 2016-07-14 – 2016-07-17 (×4): 325 mg via ORAL
  Filled 2016-07-14 (×3): qty 1

## 2016-07-14 MED ORDER — BISACODYL 10 MG RE SUPP: 10.0000 mg | Freq: Every day | RECTAL | Status: DC | PRN

## 2016-07-14 MED ORDER — SODIUM CHLORIDE 0.9 % IV SOLN: 250.0000 mL | INTRAVENOUS | Status: DC | PRN

## 2016-07-14 MED ORDER — OXYCODONE HCL 5 MG PO TABS: 5.0000 mg | ORAL_TABLET | ORAL | Status: DC | PRN

## 2016-07-14 MED ORDER — PANTOPRAZOLE SODIUM 40 MG PO TBEC
40.0000 mg | DELAYED_RELEASE_TABLET | Freq: Every day | ORAL | Status: DC
  Administered 2016-07-15 – 2016-07-17 (×3): 40 mg via ORAL
  Filled 2016-07-14 (×3): qty 1

## 2016-07-14 MED ORDER — MOVING RIGHT ALONG BOOK
Freq: Once | Status: AC
  Administered 2016-07-14: 10:00:00
  Filled 2016-07-14: qty 1

## 2016-07-14 MED ORDER — INSULIN ASPART 100 UNIT/ML ~~LOC~~ SOLN
0.0000 [IU] | Freq: Three times a day (TID) | SUBCUTANEOUS | Status: DC
  Administered 2016-07-14: 2 [IU] via SUBCUTANEOUS
  Administered 2016-07-14: 8 [IU] via SUBCUTANEOUS
  Administered 2016-07-15 – 2016-07-16 (×5): 2 [IU] via SUBCUTANEOUS

## 2016-07-14 MED ORDER — ONDANSETRON HCL 4 MG/2ML IJ SOLN: 4.0000 mg | Freq: Four times a day (QID) | INTRAMUSCULAR | Status: DC | PRN

## 2016-07-14 MED FILL — Mannitol IV Soln 20%: INTRAVENOUS | Qty: 500 | Status: AC

## 2016-07-14 MED FILL — Sodium Chloride IV Soln 0.9%: INTRAVENOUS | Qty: 2000 | Status: AC

## 2016-07-14 MED FILL — Heparin Sodium (Porcine) Inj 1000 Unit/ML: INTRAMUSCULAR | Qty: 10 | Status: AC

## 2016-07-14 MED FILL — Electrolyte-R (PH 7.4) Solution: INTRAVENOUS | Qty: 3000 | Status: AC

## 2016-07-14 MED FILL — Lidocaine HCl IV Inj 20 MG/ML: INTRAVENOUS | Qty: 10 | Status: AC

## 2016-07-14 MED FILL — Sodium Bicarbonate IV Soln 8.4%: INTRAVENOUS | Qty: 50 | Status: AC

## 2016-07-14 MED FILL — Potassium Chloride Inj 2 mEq/ML: INTRAVENOUS | Qty: 40 | Status: AC

## 2016-07-14 MED FILL — Magnesium Sulfate Inj 50%: INTRAMUSCULAR | Qty: 10 | Status: AC

## 2016-07-14 MED FILL — Heparin Sodium (Porcine) Inj 1000 Unit/ML: INTRAMUSCULAR | Qty: 30 | Status: AC

## 2016-07-14 NOTE — Care Management Note (Signed)
Case Management Note  Patient Details  Name: [name] MRN: [id number] Date of Birth: Sep 18, 1970  Subjective/Objective:   S/p CABG                   Action/Plan:   PTA independent from home with husband.  Mom/husband will ensure pt has 24/7 supervision as recommended post discharge.  CM will continue to follow for discharge needs   Expected Discharge Date:                  Expected Discharge Plan:  Home/Self Care  In-House Referral:     Discharge planning Services  CM Consult  Post Acute Care Choice:    Choice offered to:     DME Arranged:    DME Agency:     HH Arranged:    HH Agency:     Status of Service:  In process, will continue to follow  If discussed at Long Length of Stay Meetings, dates discussed:    Additional Comments:  Maryclare Labrador, RN 07/14/2016, 11:31 AM

## 2016-07-14 NOTE — Progress Notes (Signed)
Patient ID: [name], female   DOB: 10-14-1970, 46 y.o.   MRN: [id number] EVENING ROUNDS NOTE :     [address]       [place] [address]             [phone]                 2 Days Post-Op Procedure(s) (LRB): CORONARY ARTERY BYPASS GRAFTING (CABG), ON PUMP, TIMES ONE, USING LEFT INTERNAL MAMMARY ARTERY WITH TEE (N/A)  Total Length of Stay:  LOS: 3 days  BP 107/80 (BP Location: Left Arm)   Pulse 87   Temp 98.5 F (36.9 C) (Oral)   Resp (!) 23   Ht 4\' 11"  (1.499 m)   Wt 135 lb 11.2 oz (61.6 kg)   LMP 08/10/2011   SpO2 98%   BMI 27.41 kg/m   .Intake/Output      02/19 0701 - 02/20 0700   P.O. 480   I.V. (mL/kg) 20 (0.3)   IV Piggyback    Total Intake(mL/kg) 500 (8.1)   Urine (mL/kg/hr) 1180 (1.4)   Chest Tube    Total Output 1180   Net -680       Urine Occurrence 1 x        Lab Results  Component Value Date   WBC 12.8 (H) 07/14/2016   HGB 8.0 (L) 07/14/2016   HCT 24.2 (L) 07/14/2016   PLT 152 07/14/2016   GLUCOSE 146 (H) 07/14/2016   ALT 69 (H) 07/11/2016   AST 68 (H) 07/11/2016   NA 138 07/14/2016   K 4.4 07/14/2016   CL 108 07/14/2016   CREATININE 0.74 07/14/2016   BUN 23 (H) 07/14/2016   CO2 23 07/14/2016   TSH 1.146 07/12/2016   INR 1.21 07/12/2016   HGBA1C 6.5 (H) 07/12/2016   Stable postop waiting for step down   Grace Isaac MD  Beeper [phone] Office [phone] 07/14/2016 8:47 PM

## 2016-07-14 NOTE — Progress Notes (Signed)
2 Days Post-Op Procedure(s) (LRB): CORONARY ARTERY BYPASS GRAFTING (CABG), ON PUMP, TIMES ONE, USING LEFT INTERNAL MAMMARY ARTERY WITH TEE (N/A) Subjective:  No complaints  Objective: Vital signs in last 24 hours: Temp:  [98.2 F (36.8 C)-100.4 F (38 C)] 98.3 F (36.8 C) (02/19 0400) Cardiac Rhythm: Normal sinus rhythm (02/19 0600) Resp:  [14-34] 21 (02/19 0700) BP: (95-120)/(68-88) 99/71 (02/19 0700) SpO2:  [92 %-98 %] 92 % (02/19 0700) Arterial Line BP: (103-120)/(65-68) 120/68 (02/18 1100) Weight:  [61.6 kg (135 lb 11.2 oz)] 61.6 kg (135 lb 11.2 oz) (02/19 0640)  Hemodynamic parameters for last 24 hours: PAP: (25-26)/(14-17) 25/14 CO:  [3.2 L/min] 3.2 L/min CI:  [2.1 L/min/m2] 2.1 L/min/m2  Intake/Output from previous day: 02/18 0701 - 02/19 0700 In: 1644.3 [P.O.:1020; I.V.:259.3; IV Piggyback:365] Out: 910 [Urine:830; Chest Tube:80] Intake/Output this shift: No intake/output data recorded.  General appearance: alert and cooperative Neurologic: intact Heart: regular rate and rhythm, S1, S2 normal, no murmur, click, rub or gallop Lungs: clear to auscultation bilaterally Extremities: edema moderate Wound: dressing dry  Lab Results:  Recent Labs  07/13/16 1656 07/14/16 0438  WBC 13.1* 12.8*  HGB 8.7* 8.0*  HCT 26.1* 24.2*  PLT 147* 152   BMET:  Recent Labs  07/13/16 0448 07/13/16 1643 07/13/16 1656 07/14/16 0438  NA 140 144  --  138  K 3.6 3.6  --  4.4  CL 110 110  --  108  CO2 20*  --   --  23  GLUCOSE 131* 142*  --  146*  BUN 9 19  --  23*  CREATININE 0.58 0.70 0.93 0.74  CALCIUM 8.0*  --   --  8.3*    PT/INR:  Recent Labs  07/12/16 1600  LABPROT 15.4*  INR 1.21   ABG    Component Value Date/Time   PHART 7.376 07/12/2016 2104   HCO3 21.6 07/12/2016 2104   TCO2 20 07/13/2016 1643   ACIDBASEDEF 3.0 (H) 07/12/2016 2104   O2SAT 98.0 07/12/2016 2104   CBG (last 3)   Recent Labs  07/13/16 1252 07/13/16 2016 07/14/16 0014  GLUCAP  113* 162* 163*   CLINICAL DATA:  CABG.  EXAM: PORTABLE CHEST 1 VIEW  COMPARISON:  07/13/2016.  FINDINGS: Interim removal of left chest tube and mediastinal drainage catheters. Interim removal Swan-Ganz catheter. Right IJ sheath in stable position. Prior CABG. Cardiomegaly with mild bilateral interstitial prominence. Mild component CHF cannot be excluded. Low lung volumes. Small left pleural effusion cannot be excluded. No pneumothorax .  IMPRESSION: 1. Interim removal of left chest tube and mediastinal drainage catheters. Interim removal Swan-Ganz catheter. Right IJ sheath in stable position.  2. Prior CABG. Cardiomegaly with mild bilateral interstitial prominence. Small left pleural effusion cannot be excluded. Mild component CHF cannot be excluded.  3. Low lung volumes.   Electronically Signed   By: Marcello Moores  Register   On: 07/14/2016 07:10  Assessment/Plan: S/P Procedure(s) (LRB): CORONARY ARTERY BYPASS GRAFTING (CABG), ON PUMP, TIMES ONE, USING LEFT INTERNAL MAMMARY ARTERY WITH TEE (N/A) Anteroseptal MI  She is hemodynamically stable in sinus rhythm. Continue low dose beta blocker.  volume excess: Continue diuresis. Did not respond much to 20 mg lasix so will give 40 mg this am.  Continue IS, ambulation  Transfer to 2W.   LOS: 3 days    [name] 07/14/2016

## 2016-07-14 NOTE — Progress Notes (Signed)
Inpatient Diabetes Program Recommendations  AACE/ADA: New Consensus Statement on Inpatient Glycemic Control (2015)  Target Ranges:  Prepandial:   less than 140 mg/dL      Peak postprandial:   less than 180 mg/dL (1-2 hours)      Critically ill patients:  140 - 180 mg/dL   Lab Results  Component Value Date   GLUCAP 148 (H) 07/14/2016   HGBA1C 6.5 (H) 07/12/2016    Review of Glycemic Control Results for SHAHD, VENZOR (MRN [id number]) as of 07/14/2016 14:25  Ref. Range 07/13/2016 12:52 07/13/2016 20:16 07/14/2016 00:14 07/14/2016 08:54 07/14/2016 12:27  Glucose-Capillary Latest Ref Range: 65 - 99 mg/dL 113 (H) 162 (H) 163 (H) 141 (H) 148 (H)    Inpatient Diabetes Program Recommendations:  Noted no prior hx diabetes and A1c 6.5 (hgb level @ this time 8.5). Please have patient followup with PCP after D/C regarding elevated A1c 8.5.  Thank you, Nani Gasser. Geoffery Aultman, RN, MSN, CDE Inpatient Glycemic Control Team Team Pager [phone] (8am-5pm) 07/14/2016 2:35 PM

## 2016-07-15 LAB — CBC
HCT: 24.6 % — ABNORMAL LOW (ref 36.0–46.0)
Hemoglobin: 8.1 g/dL — ABNORMAL LOW (ref 12.0–15.0)
MCH: 28.9 pg (ref 26.0–34.0)
MCHC: 32.9 g/dL (ref 30.0–36.0)
MCV: 87.9 fL (ref 78.0–100.0)
PLATELETS: 167 10*3/uL (ref 150–400)
RBC: 2.8 MIL/uL — ABNORMAL LOW (ref 3.87–5.11)
RDW: 14.2 % (ref 11.5–15.5)
WBC: 10.1 10*3/uL (ref 4.0–10.5)

## 2016-07-15 LAB — POCT I-STAT 3, ART BLOOD GAS (G3+)
ACID-BASE DEFICIT: 1 mmol/L (ref 0.0–2.0)
ACID-BASE EXCESS: 2 mmol/L (ref 0.0–2.0)
Acid-base deficit: 2 mmol/L (ref 0.0–2.0)
BICARBONATE: 22.7 mmol/L (ref 20.0–28.0)
BICARBONATE: 25.8 mmol/L (ref 20.0–28.0)
BICARBONATE: 22.5 mmol/L (ref 20.0–28.0)
O2 SAT: 100 %
O2 SAT: 100 %
O2 Saturation: 100 %
PH ART: 7.423 (ref 7.350–7.450)
TCO2: 24 mmol/L (ref 0–100)
TCO2: 27 mmol/L (ref 0–100)
TCO2: 23 mmol/L (ref 0–100)
pCO2 arterial: 34.6 mmHg (ref 32.0–48.0)
pCO2 arterial: 37.9 mmHg (ref 32.0–48.0)
pCO2 arterial: 34.4 mmHg (ref 32.0–48.0)
pH, Arterial: 7.426 (ref 7.350–7.450)
pH, Arterial: 7.441 (ref 7.350–7.450)
pO2, Arterial: 412 mmHg — ABNORMAL HIGH (ref 83.0–108.0)
pO2, Arterial: 479 mmHg — ABNORMAL HIGH (ref 83.0–108.0)
pO2, Arterial: 400 mmHg — ABNORMAL HIGH (ref 83.0–108.0)

## 2016-07-15 LAB — BASIC METABOLIC PANEL
Anion gap: 9 (ref 5–15)
BUN: 15 mg/dL (ref 6–20)
CALCIUM: 8.2 mg/dL — AB (ref 8.9–10.3)
CO2: 25 mmol/L (ref 22–32)
Chloride: 101 mmol/L (ref 101–111)
Creatinine, Ser: 0.59 mg/dL (ref 0.44–1.00)
GFR calc Af Amer: >60 mL/min (ref >60)
GLUCOSE: 145 mg/dL — AB (ref 65–99)
Potassium: 3.7 mmol/L (ref 3.5–5.1)
SODIUM: 135 mmol/L (ref 135–145)

## 2016-07-15 LAB — POCT I-STAT, CHEM 8
BUN: 11 mg/dL (ref 6–20)
BUN: 11 mg/dL (ref 6–20)
BUN: 7 mg/dL (ref 6–20)
BUN: 10 mg/dL (ref 6–20)
CALCIUM ION: 0.79 mmol/L — AB (ref 1.15–1.40)
CALCIUM ION: 1.21 mmol/L (ref 1.15–1.40)
CHLORIDE: 106 mmol/L (ref 101–111)
CREATININE: 0.4 mg/dL — AB (ref 0.44–1.00)
CREATININE: 0.3 mg/dL — AB (ref 0.44–1.00)
CREATININE: 0.4 mg/dL — AB (ref 0.44–1.00)
Calcium, Ion: 1.19 mmol/L (ref 1.15–1.40)
Calcium, Ion: 1.05 mmol/L — ABNORMAL LOW (ref 1.15–1.40)
Chloride: 111 mmol/L (ref 101–111)
Chloride: 103 mmol/L (ref 101–111)
Chloride: 109 mmol/L (ref 101–111)
Creatinine, Ser: 0.2 mg/dL — ABNORMAL LOW (ref 0.44–1.00)
GLUCOSE: 119 mg/dL — AB (ref 65–99)
GLUCOSE: 135 mg/dL — AB (ref 65–99)
GLUCOSE: 96 mg/dL (ref 65–99)
GLUCOSE: 170 mg/dL — AB (ref 65–99)
HCT: 29 % — ABNORMAL LOW (ref 36.0–46.0)
HCT: 21 % — ABNORMAL LOW (ref 36.0–46.0)
HCT: 27 % — ABNORMAL LOW (ref 36.0–46.0)
HCT: 19 % — ABNORMAL LOW (ref 36.0–46.0)
HEMOGLOBIN: 7.1 g/dL — AB (ref 12.0–15.0)
Hemoglobin: 9.9 g/dL — ABNORMAL LOW (ref 12.0–15.0)
Hemoglobin: 9.2 g/dL — ABNORMAL LOW (ref 12.0–15.0)
Hemoglobin: 6.5 g/dL — CL (ref 12.0–15.0)
POTASSIUM: 3.6 mmol/L (ref 3.5–5.1)
POTASSIUM: 3.6 mmol/L (ref 3.5–5.1)
Potassium: 3.6 mmol/L (ref 3.5–5.1)
Potassium: 3.7 mmol/L (ref 3.5–5.1)
Sodium: 142 mmol/L (ref 135–145)
Sodium: 142 mmol/L (ref 135–145)
Sodium: 144 mmol/L (ref 135–145)
Sodium: 138 mmol/L (ref 135–145)
TCO2: 24 mmol/L (ref 0–100)
TCO2: 24 mmol/L (ref 0–100)
TCO2: 28 mmol/L (ref 0–100)
TCO2: 25 mmol/L (ref 0–100)

## 2016-07-15 LAB — GLUCOSE, CAPILLARY
GLUCOSE-CAPILLARY: 133 mg/dL — AB (ref 65–99)
Glucose-Capillary: 145 mg/dL — ABNORMAL HIGH (ref 65–99)
Glucose-Capillary: 128 mg/dL — ABNORMAL HIGH (ref 65–99)
Glucose-Capillary: 129 mg/dL — ABNORMAL HIGH (ref 65–99)

## 2016-07-15 MED FILL — Sodium Chloride IV Soln 0.9%: INTRAVENOUS | Qty: 100 | Status: AC

## 2016-07-15 MED FILL — Dexmedetomidine HCl IV Soln 200 MCG/2ML: INTRAVENOUS | Qty: 4 | Status: AC

## 2016-07-15 MED FILL — Heparin Sodium (Porcine) Inj 1000 Unit/ML: INTRAMUSCULAR | Qty: 2500 | Status: AC

## 2016-07-15 NOTE — Progress Notes (Signed)
CARDIAC REHAB PHASE I   PRE:  Rate/Rhythm: 99 SR  BP:  Sitting: 92/59        SaO2: 95 RA  MODE:  Ambulation: 300 ft   POST:  Rate/Rhythm: 115 ST  BP:  Sitting: 100/61         SaO2: 95 RA  Pt able to get oob independently. Pt ambulated 300 ft on RA, hand held assist, steady gait, tolerated fairly well. Pt c/o DOE (had difficulty trying to walk and talk at the same time), fatigue with distance, standing rest x2, denies any other complaints. Pt to bed after walk (states she wants to take a nap), call bell within reach. Encouraged oob to chair, additional ambulation x1 today. Will follow.   [id number] Lenna Sciara, RN, BSN 07/15/2016 1:26 PM

## 2016-07-15 NOTE — Progress Notes (Signed)
3 Days Post-Op Procedure(s) (LRB): CORONARY ARTERY BYPASS GRAFTING (CABG), ON PUMP, TIMES ONE, USING LEFT INTERNAL MAMMARY ARTERY WITH TEE (N/A) Subjective:  No complaints  Objective: Vital signs in last 24 hours: Temp:  [98.2 F (36.8 C)-99.1 F (37.3 C)] 98.7 F (37.1 C) (02/20 0739) Cardiac Rhythm: Sinus tachycardia (02/20 0800) Resp:  [19-31] 24 (02/20 0800) BP: (93-112)/(66-80) 106/77 (02/20 0800) SpO2:  [91 %-98 %] 96 % (02/20 0800) Weight:  [61.4 kg (135 lb 5.8 oz)] 61.4 kg (135 lb 5.8 oz) (02/20 0600)  Hemodynamic parameters for last 24 hours:    Intake/Output from previous day: 02/19 0701 - 02/20 0700 In: 500 [P.O.:480; I.V.:20] Out: 1855 [Urine:1855] Intake/Output this shift: No intake/output data recorded.  General appearance: alert and cooperative Neurologic: intact Heart: regular rate and rhythm, S1, S2 normal, no murmur, click, rub or gallop Lungs: clear to auscultation bilaterally Extremities: edema mild Wound: incision ok  Lab Results:  Recent Labs  07/14/16 0438 07/15/16 0352  WBC 12.8* 10.1  HGB 8.0* 8.1*  HCT 24.2* 24.6*  PLT 152 167   BMET:  Recent Labs  07/14/16 0438 07/15/16 0352  NA 138 135  K 4.4 3.7  CL 108 101  CO2 23 25  GLUCOSE 146* 145*  BUN 23* 15  CREATININE 0.74 0.59  CALCIUM 8.3* 8.2*    PT/INR:  Recent Labs  07/12/16 1600  LABPROT 15.4*  INR 1.21   ABG    Component Value Date/Time   PHART 7.376 07/12/2016 2104   HCO3 21.6 07/12/2016 2104   TCO2 20 07/13/2016 1643   ACIDBASEDEF 3.0 (H) 07/12/2016 2104   O2SAT 98.0 07/12/2016 2104   CBG (last 3)   Recent Labs  07/14/16 1725 07/14/16 2208 07/15/16 0737  GLUCAP 207* 102* 133*    Assessment/Plan: S/P Procedure(s) (LRB): CORONARY ARTERY BYPASS GRAFTING (CABG), ON PUMP, TIMES ONE, USING LEFT INTERNAL MAMMARY ARTERY WITH TEE (N/A)  She is hemodynamically stable in sinus rhythm. BP is normal. No room for any increase in  Lopressor.  On Imdur for one  month to prevent arterial spasm with spastic IMA noted at surgery.  Volume excess: continue diuretic.  DM: Hgb A1c was 6.5 preop with no hx of DM. Will need outpatient follow up with PCP.  Awaiting bed on 2W. Continue IS, ambulation.  LOS: 4 days    Gaye Pollack 07/15/2016

## 2016-07-16 ENCOUNTER — Inpatient Hospital Stay (HOSPITAL_COMMUNITY): Payer: Federal, State, Local not specified - PPO

## 2016-07-16 DIAGNOSIS — R9431 Abnormal electrocardiogram [ECG] [EKG]: Secondary | ICD-10-CM

## 2016-07-16 LAB — ECHOCARDIOGRAM COMPLETE
E/e' ratio: 15.47
EWDT: 169 ms
FS: 23 % — AB (ref 28–44)
HEIGHTINCHES: 59 in
IV/PV OW: 1.17
LA diam end sys: 28 mm
LA vol A4C: 32.8 ml
LA vol: 41.9 mL
LADIAMINDEX: 1.81 cm/m2
LASIZE: 28 mm
LAVOLIN: 27 mL/m2
LV E/e' medial: 15.47
LV e' LATERAL: 6.53 cm/s
LVEEAVG: 15.47
LVOT area: 2.01 cm2
LVOT diameter: 16 mm
Lateral S' vel: 8.33 cm/s
MV Dec: 169
MV Peak grad: 4 mmHg
MV pk E vel: 101 m/s
MVPKAVEL: 71.3 m/s
PW: 11.4 mm — AB (ref 0.6–1.1)
Reg peak vel: 267 cm/s
TDI e' lateral: 6.53
TDI e' medial: 7.43
TR max vel: 267 cm/s
WEIGHTICAEL: 2137.6 [oz_av]

## 2016-07-16 LAB — GLUCOSE, CAPILLARY
GLUCOSE-CAPILLARY: 102 mg/dL — AB (ref 65–99)
GLUCOSE-CAPILLARY: 116 mg/dL — AB (ref 65–99)
Glucose-Capillary: 135 mg/dL — ABNORMAL HIGH (ref 65–99)
Glucose-Capillary: 146 mg/dL — ABNORMAL HIGH (ref 65–99)

## 2016-07-16 MED ORDER — ACETAMINOPHEN 325 MG PO TABS
650.0000 mg | ORAL_TABLET | Freq: Four times a day (QID) | ORAL | Status: DC | PRN
  Administered 2016-07-16: 650 mg via ORAL
  Filled 2016-07-16: qty 2

## 2016-07-16 MED ORDER — PERFLUTREN LIPID MICROSPHERE
1.0000 mL | INTRAVENOUS | Status: AC | PRN
Start: 2016-07-16 — End: 2016-07-16
  Administered 2016-07-16: 2 mL via INTRAVENOUS
  Filled 2016-07-16: qty 10

## 2016-07-16 NOTE — Discharge Summary (Signed)
Physician Discharge Summary  Patient ID: [name] MRN: [id number] DOB/AGE: [birth year] 46 y.o.  Admit date: 07/11/2016 Discharge date: 07/17/2016  Admission Diagnoses: Patient Active Problem List   Diagnosis Date Noted  . S/P CABG x 1 07/12/2016  . NSTEMI (non-ST elevated myocardial infarction) ([place]) 07/11/2016    Discharge Diagnoses:  Active Problems:   NSTEMI (non-ST elevated myocardial infarction) (H[facility])   S/P CABG x 1 Discharged Condition: good  HPI:  The patient has a history of hypercholesterolemia and statin intolerance and has had chest pain off and on over the past week. Thursday she had a bad episode but did not seek medical care and went to work Friday. Then last night had another severe episode and presented to [facility] ER. ECG showed anterolateral T-wave inversion with troponin of 4. Her pain resolved after admission and she was transferred to [facility] for further evaluation. This am her pain returned on IV heparin and topical nitrates and troponin was up to 7.45. She was taken to cath lab and has a 99% ostial and 95% proximal LAD stenosis. The LCX and RCA look fine. LVEF is 45-50% with anterolateral hypokinesis. She is still having a little chest pain. I was called to the cath lab to evaluate.  Hospital Course:  On 07/12/2016 [name] underwent a coronary artery bypass grafting x 1 with Dr. Arvid Right. She tolerated the procedure well and was transferred to the ICU. POD 1 she was extubated in a timely manner. We discontinued her chest tube and pacing wires. We transitioned her to Levemir and discontinued her insulin drip. We started Imdur to prevent IMA spasm.  She remained on Neo and dopamine, therefore we held her beta blocker. On POD 2 we were able to start a beta blocker and a diuretic regimen for fluid overload. POD 3 she continued to progress. She remained hemodynamically stable off pressor medication. She was transferred to the telemetry unit for continued care. On the floor we  discontinued her epicardial pacing wires and encouraged ambulation. She was tolerating room air. Today her incision is healing well, she is ambulating with limited assistance, her incision is healing well and she is ready for discharge.   Consults: cardiology  Significant Diagnostic Studies:  Cardiac cath  Conclusion     LM lesion, 99 %stenosed.  Ost LAD to Prox LAD lesion, 95 %stenosed.  There is mild to moderate left ventricular systolic dysfunction.  LV end diastolic pressure is severely elevated.  The left ventricular ejection fraction is 45-50% by visual estimate.     Treatments:   CARDIOVASCULAR SURGERY OPERATIVE NOTE  07/12/2016  Surgeon:  Gaye Pollack, MD  First Assistant: Jadene Pierini,  PA-C   Preoperative Diagnosis:  High grade ostial/proximal LAD coronary artery stenosis s/p acute NSTEMI   Postoperative Diagnosis:  Same   Procedure: Emergent  1. Median Sternotomy 2. Extracorporeal circulation 3.   Coronary artery bypass grafting x 1   Left internal mammary graft to the LAD  Discharge Exam: Blood pressure 103/65, pulse 98, temperature 98.4 F (36.9 C), temperature source Oral, resp. rate 18, height 4\' 11"  (1.499 m), weight 130 lb 9.6 oz (59.2 kg), last menstrual period 08/10/2011, SpO2 100 %.  General appearance: alert, cooperative and no distress Heart: regular rate and rhythm Lungs: clear Abdomen: benign Extremities: no edema Wound: incis healing well  Disposition: 01-Home or Self Care  Discharge Instructions    AMB Referral to Cardiac Rehabilitation - Phase II    Complete by:  As directed  Diagnosis:  NSTEMI   Discharge patient    Complete by:  As directed    Discharge disposition:  01-Home or Self Care   Discharge patient date:  07/17/2016     Allergies as of 07/17/2016      Reactions   Bactrim Itching, Rash         Medication List    STOP taking these medications   naproxen 500 MG tablet Commonly known as:   NAPROSYN   traMADol 50 MG tablet Commonly known as:  ULTRAM     TAKE these medications   amitriptyline 50 MG tablet Commonly known as:  ELAVIL Take 25 mg by mouth at bedtime.   APPLE CIDER VINEGAR PO Take 2 tablets by mouth daily.   aspirin 325 MG EC tablet Take 1 tablet (325 mg total) by mouth daily.   atorvastatin 80 MG tablet Commonly known as:  LIPITOR Take 1 tablet (80 mg total) by mouth daily at 6 PM.   baclofen 10 MG tablet Commonly known as:  LIORESAL Take 10 mg by mouth 2 (two) times daily as needed (headache). Limit to two days per week.   LORazepam 0.5 MG tablet Commonly known as:  ATIVAN Take 0.5 mg by mouth at bedtime. And one tablet for anxiety as needed. Do not exceed 2 tablets.   metoprolol tartrate 25 MG tablet Commonly known as:  LOPRESSOR Take 1 tablet (25 mg total) by mouth 2 (two) times daily.   oxyCODONE 5 MG immediate release tablet Commonly known as:  Oxy IR/ROXICODONE Take 1-2 tablets (5-10 mg total) by mouth every 6 (six) hours as needed for severe pain.   SUMAtriptan 100 MG tablet Commonly known as:  IMITREX Take 100 mg by mouth as directed. As needed for migraine. May repeat in 2 hours.   zonisamide 100 MG capsule Commonly known as:  ZONEGRAN Take 100 mg by mouth at bedtime.      Follow-up Information    Gaye Pollack, MD Follow up.   Specialty:  Cardiothoracic Surgery Why:  Your appointment is on 08/13/2016 at 9:30am. Please arrive at 9:00am for a chest xray at [facility] located on the first floor of our building.  Contact information: [place] [address] [phone]        Melinda Crutch, MD. Call in 1 day.   Specialty:  Family Medicine Why:  PLEASE CALL TO SCHEDULE Contact information: [place] [address] [phone]        nursing.   Why:  Your chest tube suture removal appointment is on 07/23/2016 at 10:00am. Please report to Dr. Cyndia Bent office.  Contact  information: Dr. Vivi Martens office         The patient has been discharged on:   1.Beta Blocker:  Yes Blue.Reese   ]                              No   [   ]                              If No, reason:  2.Ace Inhibitor/ARB: Yes [   ]                                     No  [    ]  If No,n reason:bp too low  3.Statin:   Yes y[   ]                  No  [   ]                  If No, reason:  4.Ecasa:  Yes  [ y  ]                  No   [   ]                  If No, reason:  Signed: Mickel Schreur E 07/17/2016, 7:36 AM

## 2016-07-16 NOTE — Progress Notes (Signed)
Epicardial pacing wires removed per MD order. No complaints of pain. Vitals WNL. Wires intact. Sites cleansed. Bed rest for one hour  Starting at 1045. Will continue to monitor.  Cyndia Bent RN

## 2016-07-16 NOTE — Progress Notes (Signed)
  Echocardiogram 2D Echocardiogram with Definity has been performed.  [name] 07/16/2016, 9:44 AM

## 2016-07-16 NOTE — Discharge Instructions (Signed)

## 2016-07-16 NOTE — Progress Notes (Addendum)
      [address]       [place] [address]             [phone]      4 Days Post-Op Procedure(s) (LRB): CORONARY ARTERY BYPASS GRAFTING (CABG), ON PUMP, TIMES ONE, USING LEFT INTERNAL MAMMARY ARTERY WITH TEE (N/A) Subjective: No issues overnight. She shares that she slept well.   Objective: Vital signs in last 24 hours: Temp:  [98.4 F (36.9 C)-99 F (37.2 C)] 98.5 F (36.9 C) (02/21 0527) Pulse Rate:  [97-103] 97 (02/21 0527) Cardiac Rhythm: Sinus tachycardia (02/20 2041) Resp:  [18] 18 (02/21 0527) BP: (94-116)/(61-76) 94/69 (02/21 0527) SpO2:  [94 %-99 %] 94 % (02/21 0527) Weight:  [60.6 kg (133 lb 9.6 oz)] 60.6 kg (133 lb 9.6 oz) (02/21 0527)    Intake/Output from previous day: 02/20 0701 - 02/21 0700 In: 960 [P.O.:960] Out: 1750 [Urine:1750] Intake/Output this shift: No intake/output data recorded.  General appearance: alert, cooperative and no distress Heart:sinus tachycardia with rate 100s Lungs: clear to auscultation bilaterally Abdomen: soft, non-tender; bowel sounds normal; no masses,  no organomegaly Extremities: extremities normal, atraumatic, no cyanosis or edema Wound: clean and dry without drainage  Lab Results:  Recent Labs  07/14/16 0438 07/15/16 0352  WBC 12.8* 10.1  HGB 8.0* 8.1*  HCT 24.2* 24.6*  PLT 152 167   BMET:  Recent Labs  07/14/16 0438 07/15/16 0352  NA 138 135  K 4.4 3.7  CL 108 101  CO2 23 25  GLUCOSE 146* 145*  BUN 23* 15  CREATININE 0.74 0.59  CALCIUM 8.3* 8.2*    PT/INR: No results for input(s): LABPROT, INR in the last 72 hours. ABG    Component Value Date/Time   PHART 7.376 07/12/2016 2104   HCO3 21.6 07/12/2016 2104   TCO2 20 07/13/2016 1643   ACIDBASEDEF 3.0 (H) 07/12/2016 2104   O2SAT 98.0 07/12/2016 2104   CBG (last 3)   Recent Labs  07/15/16 1631 07/15/16 2157 07/16/16 0615  GLUCAP 128* 129* 102*    Assessment/Plan: S/P Procedure(s) (LRB): CORONARY ARTERY BYPASS  GRAFTING (CABG), ON PUMP, TIMES ONE, USING LEFT INTERNAL MAMMARY ARTERY WITH TEE (N/A)  1. CV-NSR rate is 100s with borderline hypotension. Parameters on Lopressor. Continue Imdur for 30 days.  2. Pulm-on room air without issue. Good oxygen saturation.  3. Renal-creatinine stable, last value was 0.59. Electrolytes okay. Good urine output. Weight is trending down. Continue diuretic 4. H and H stable and platelets 167k 5. DVT proph- Lovenox.   Plan: Progressing well. Discontinue EPW since rhythm has been stable. Possibly home tomorrow if continues to progress.    LOS: 5 days    Elgie Collard 07/16/2016   Chart reviewed, patient examined, agree with above. She is walking well. She has headache today and that may be related to Imdur. She has had migraines before. Will stop the Imdur. Her BP runs on the low side anyway. 2D echo shows EF 40-45% with severe septal and apical hypokinesis, mild LVH and grade 2 diastolic dysfunction.  Possibly home tomorrow if she continues to do well. Possibly home tomorrow if she continues to feels well.

## 2016-07-16 NOTE — Progress Notes (Signed)
CARDIAC REHAB PHASE I   PRE:  Rate/Rhythm: 102 ST  BP:  Sitting: 98/64        SaO2: 96 RA  MODE:  Ambulation: 300 ft   POST:  Rate/Rhythm: 119 ST  BP:  Sitting: 113/77         SaO2: 99 RA  Pt in bed, able to transition from lying to sitting position with minimal assistance. Pt ambulated 300 ft on RA, hand held assist, mostly steady gait, tolerated well. Pt c/o mild DOE, states she does not like to walk and talk at the same time because she feels she cannot take a deep breath. Encouraged deep breathing, cough, IS. Pt verbalized understanding. Pt to bathroom and then to recliner after walk, feet elevated, call bell within reach. VSS. Will follow.   [id number] Lenna Sciara, RN, BSN 07/16/2016 10:05 AM

## 2016-07-17 LAB — GLUCOSE, CAPILLARY: Glucose-Capillary: 118 mg/dL — ABNORMAL HIGH (ref 65–99)

## 2016-07-17 MED ORDER — OXYCODONE HCL 5 MG PO TABS: 5.0000 mg | ORAL_TABLET | Freq: Four times a day (QID) | ORAL | 0 refills | Status: DC | PRN

## 2016-07-17 MED ORDER — METOPROLOL TARTRATE 25 MG PO TABS: 25.0000 mg | ORAL_TABLET | Freq: Two times a day (BID) | ORAL | 1 refills | Status: DC

## 2016-07-17 MED ORDER — ATORVASTATIN CALCIUM 80 MG PO TABS: 80.0000 mg | ORAL_TABLET | Freq: Every day | ORAL | 1 refills | Status: DC

## 2016-07-17 MED ORDER — ASPIRIN 325 MG PO TBEC: 325.0000 mg | DELAYED_RELEASE_TABLET | Freq: Every day | ORAL | 0 refills | Status: DC

## 2016-07-17 NOTE — Progress Notes (Signed)
Discharged patient per MD order. IV removed, telemetry d/c. CCMD notified. AVS completed with patient and spouse including medications and follow up importance. Explained incisional care. No further quesitons at this time. Patient to home with spouse.  Cyndia Bent RN

## 2016-07-17 NOTE — Progress Notes (Addendum)
      [address]       [place] [address]             [phone]      5 Days Post-Op Procedure(s) (LRB): CORONARY ARTERY BYPASS GRAFTING (CABG), ON PUMP, TIMES ONE, USING LEFT INTERNAL MAMMARY ARTERY WITH TEE (N/A) Subjective: Feels well  Objective: Vital signs in last 24 hours: Temp:  [98.1 F (36.7 C)-99.9 F (37.7 C)] 98.4 F (36.9 C) (02/22 0502) Pulse Rate:  [98-102] 98 (02/22 0502) Cardiac Rhythm: Sinus tachycardia (02/21 1940) Resp:  [18] 18 (02/22 0502) BP: (103-113)/(64-78) 103/65 (02/22 0502) SpO2:  [98 %-100 %] 100 % (02/22 0502) Weight:  [130 lb 9.6 oz (59.2 kg)] 130 lb 9.6 oz (59.2 kg) (02/22 0502)  Hemodynamic parameters for last 24 hours:    Intake/Output from previous day: 02/21 0701 - 02/22 0700 In: 600 [P.O.:600] Out: 2000 [Urine:2000] Intake/Output this shift: No intake/output data recorded.  General appearance: alert, cooperative and no distress Heart: regular rate and rhythm Lungs: clear Abdomen: benign Extremities: no edema Wound: incis healing well  Lab Results:  Recent Labs  07/15/16 0352  WBC 10.1  HGB 8.1*  HCT 24.6*  PLT 167   BMET:  Recent Labs  07/15/16 0352  NA 135  K 3.7  CL 101  CO2 25  GLUCOSE 145*  BUN 15  CREATININE 0.59  CALCIUM 8.2*    PT/INR: No results for input(s): LABPROT, INR in the last 72 hours. ABG    Component Value Date/Time   PHART 7.376 07/12/2016 2104   HCO3 21.6 07/12/2016 2104   TCO2 20 07/13/2016 1643   ACIDBASEDEF 3.0 (H) 07/12/2016 2104   O2SAT 98.0 07/12/2016 2104   CBG (last 3)   Recent Labs  07/16/16 1616 07/16/16 2116 07/17/16 0603  GLUCAP 146* 116* 118*    Meds Scheduled Meds: . amitriptyline  25 mg Oral QHS  . aspirin EC  325 mg Oral Daily  . atorvastatin  80 mg Oral q1800  . docusate sodium  200 mg Oral Daily  . enoxaparin (LOVENOX) injection  40 mg Subcutaneous QHS  . furosemide  40 mg Oral Daily  . insulin aspart  0-24 Units Subcutaneous TID  AC & HS  . metoprolol tartrate  12.5 mg Oral BID  . pantoprazole  40 mg Oral QAC breakfast  . potassium chloride  20 mEq Oral Daily  . sodium chloride flush  3 mL Intravenous Q12H  . zonisamide  100 mg Oral QHS   Continuous Infusions: PRN Meds:.sodium chloride, acetaminophen, bisacodyl **OR** bisacodyl, ondansetron **OR** ondansetron (ZOFRAN) IV, oxyCODONE, sodium chloride flush, traMADol  Xrays No results found.  Assessment/Plan: S/P Procedure(s) (LRB): CORONARY ARTERY BYPASS GRAFTING (CABG), ON PUMP, TIMES ONE, USING LEFT INTERNAL MAMMARY ARTERY WITH TEE (N/A)  1 doing well 2 stable for discharge   LOS: 6 days    GOLD,WAYNE E 07/17/2016   Chart reviewed, patient examined, agree with above. She feels well. Vitals are stable. Headache resolved. I think she is stable to go home.

## 2016-07-17 NOTE — Progress Notes (Signed)
Ed completed with pt, husband and father-in-law. Pt voiced understanding but did not ask many questions. Her and her husband were quiet and nodded. Discussed with her A1C and f/u with PCP. Gave DM diet and ex gl. Will refer to [facility].  [id number] Irena, ACSM 11:39 AM 07/17/2016

## 2016-07-17 NOTE — Care Management Note (Signed)
Case Management Note Previous CM note initiated by Maryclare Labrador, RN 07/14/2016, 11:31 AM   Patient Details  Name: [name] MRN: [id number] Date of Birth: [birth year]  Subjective/Objective:   S/p CABG                   Action/Plan:   PTA independent from home with husband.  Mom/husband will ensure pt has 24/7 supervision as recommended post discharge.  CM will continue to follow for discharge needs   Expected Discharge Date:  07/17/16               Expected Discharge Plan:  Home/Self Care  In-House Referral:     Discharge planning Services  CM Consult  Post Acute Care Choice:  NA Choice offered to:  NA  DME Arranged:    DME Agency:     HH Arranged:    [place] Agency:     Status of Service:  Completed, signed off  If discussed at [place] of Stay Meetings, dates discussed:  2/22  Discharge Disposition: home/self care   Additional Comments:  07/17/16- 1030- Lerline Valdivia RN, CM- pt for d/c home today with spouse and mom to assist- no CM needs noted for discharge.   Dahlia Client [place], RN 07/17/2016, 10:36 AM [phone]

## 2016-07-23 ENCOUNTER — Ambulatory Visit (INDEPENDENT_AMBULATORY_CARE_PROVIDER_SITE_OTHER): Payer: Self-pay | Admitting: *Deleted

## 2016-07-23 DIAGNOSIS — Z4802 Encounter for removal of sutures: Secondary | ICD-10-CM

## 2016-07-23 DIAGNOSIS — Z951 Presence of aortocoronary bypass graft: Secondary | ICD-10-CM

## 2016-07-23 DIAGNOSIS — I2510 Atherosclerotic heart disease of native coronary artery without angina pectoris: Secondary | ICD-10-CM

## 2016-07-23 NOTE — Progress Notes (Signed)
[name] returns s/p CABG X 2 07/12/16. She is doing fairly well. She is eating, but has had diarrhea for the last two days. She attributes this to fruit. She has lost another two pounds since discharge. There is no LE edema. She is not requiring any narcotics for pain. She is having trouble with sleeping and I recommended Melatonin, Tylenol PM or just Benadryl. She is somewhat depressed but was reassured regarding her recovery. Her sternal incision is very well healed and sutures were removed from the well healed chest tube sites.She will return for follow up as scheduled with a CXR. She also has an appointment with the cardiology P.A.

## 2016-07-25 ENCOUNTER — Telehealth: Payer: Self-pay

## 2016-07-25 ENCOUNTER — Encounter: Payer: Self-pay | Admitting: Physician Assistant

## 2016-07-25 DIAGNOSIS — I2510 Atherosclerotic heart disease of native coronary artery without angina pectoris: Secondary | ICD-10-CM | POA: Diagnosis not present

## 2016-07-25 DIAGNOSIS — I959 Hypotension, unspecified: Secondary | ICD-10-CM | POA: Diagnosis not present

## 2016-07-25 NOTE — Telephone Encounter (Signed)
[name] saw her PCP today and her BP was 94/68. She is currently taking metoprolol 25 mg BID. The PCP was recommending decreasing metoprolol to 12.5 mg BID. She was told to contact Dr Cyndia Bent for the change in medication dose.   Dr Cyndia Bent recommended that she just stop the metoprolol. [name] is scheduled to see the Cardiologist on 08/05/2016.

## 2016-07-29 DIAGNOSIS — F430 Acute stress reaction: Secondary | ICD-10-CM | POA: Diagnosis not present

## 2016-08-01 DIAGNOSIS — R1032 Left lower quadrant pain: Secondary | ICD-10-CM | POA: Diagnosis not present

## 2016-08-01 NOTE — Progress Notes (Deleted)
Cardiology Office Note    Date:  08/01/2016   ID:  [name], DOB 02-27-1971, MRN [id number]  PCP:  Melinda Crutch, MD  Cardiologist:  Dr. Johnsie Cancel  CC: post hospital follow up.   History of Present Illness:  [name] is a 46 y.o. female with a history of HLD and recently diagnosed CAD s/p CABG x1V (LIMA to LAD) who presents to clinic for post hospital follow up.   She was admitted 2/16-2/22/18 for NSTEMI. Peak troponin 7.45. She was taken to cath lab and found to have a 99% ostial and 95% proximal LAD stenosis. The LCX and RCA looked fine. LVEF was 45-50% with anterolateral hypokinesis. On 07/12/2016 [name] underwent emergent CABG x 1 with Dr. Arvid Right. She had some post op overload that required diuresis.   Today she presents to clinic for follow up.    Past Medical History:  Diagnosis Date  . Anemia    due to heavy periods  . Anxiety   . Difficult intubation 01/22/2016  . Headache   . Heart murmur    aschild antibiotics if having Dental work  . Hypocholesteremia    medical treatment  . Menorrhagia   . Pelvic pain     Past Surgical History:  Procedure Laterality Date  . ABDOMINAL HYSTERECTOMY    . CORONARY ARTERY BYPASS GRAFT N/A 07/12/2016   Procedure: CORONARY ARTERY BYPASS GRAFTING (CABG), ON PUMP, TIMES ONE, USING LEFT INTERNAL MAMMARY ARTERY WITH TEE;  Surgeon: Gaye Pollack, MD;  Location: [facility];  Service: Open Heart Surgery;  Laterality: N/A;  LIMA to LAD  . goiter     left side goiter removed  . INGUINAL HERNIA REPAIR Bilateral 01/22/2016   Procedure: LAPAROSCOPIC BILATERAL FEMORAL AND RIGHT INGUINAL HERNIA REPAIRWITH INSERTION OF MESH;  Surgeon: Michael Boston, MD;  Location: [facility];  Service: General;  Laterality: Bilateral;  . LEFT HEART CATH AND CORONARY ANGIOGRAPHY N/A 07/12/2016   Procedure: Left Heart Cath and Coronary Angiography;  Surgeon: Lorretta Harp, MD;  Location: [facility];  Service: Cardiovascular;  Laterality: N/A;  . SVD     x 1    . WISDOM TOOTH EXTRACTION      Current Medications: Outpatient Medications Prior to Visit  Medication Sig Dispense Refill  . APPLE CIDER VINEGAR PO Take 2 tablets by mouth daily.    Marland Kitchen aspirin EC 325 MG EC tablet Take 1 tablet (325 mg total) by mouth daily. 30 tablet 0  . atorvastatin (LIPITOR) 80 MG tablet Take 1 tablet (80 mg total) by mouth daily at 6 PM. 30 tablet 1  . baclofen (LIORESAL) 10 MG tablet Take 10 mg by mouth 2 (two) times daily as needed (headache). Limit to two days per week.    . metoprolol tartrate (LOPRESSOR) 25 MG tablet Take 1 tablet (25 mg total) by mouth 2 (two) times daily. 60 tablet 1  . oxyCODONE (OXY IR/ROXICODONE) 5 MG immediate release tablet Take 1-2 tablets (5-10 mg total) by mouth every 6 (six) hours as needed for severe pain. (Patient not taking: Reported on 07/23/2016) 30 tablet 0  . SUMAtriptan (IMITREX) 100 MG tablet Take 100 mg by mouth as directed. As needed for migraine. May repeat in 2 hours.    Marland Kitchen zonisamide (ZONEGRAN) 100 MG capsule Take 100 mg by mouth at bedtime.     No facility-administered medications prior to visit.      Allergies:   Bactrim   Social History   Social History  .  Marital status: Married    Spouse name: N/A  . Number of children: N/A  . Years of education: N/A   Social History Main Topics  . Smoking status: Never Smoker  . Smokeless tobacco: Never Used  . Alcohol use No  . Drug use: No  . Sexual activity: Yes    Birth control/ protection: None   Other Topics Concern  . Not on file   Social History Narrative  . No narrative on file     Family History:  The patient's ***family history is not on file.      *** ROS/PE    Wt Readings from Last 3 Encounters:  07/17/16 130 lb 9.6 oz (59.2 kg)  01/11/16 129 lb (58.5 kg)  10/06/11 140 lb (63.5 kg)      Studies/Labs Reviewed:   EKG:  EKG is*** ordered today.  The ekg ordered today demonstrates ***  Recent Labs: 07/11/2016: ALT 69 07/12/2016: B Natriuretic  Peptide 234.8; TSH 1.146 07/13/2016: Magnesium 2.5 07/15/2016: BUN 15; Creatinine, Ser 0.59; Hemoglobin 8.1; Platelets 167; Potassium 3.7; Sodium 135   Lipid Panel No results found for: CHOL, TRIG, HDL, CHOLHDL, VLDL, LDLCALC, LDLDIRECT  Additional studies/ records that were reviewed today include:  07/12/16 Cardiac cath Conclusion  LM lesion, 99 %stenosed.  Ost LAD to Prox LAD lesion, 95 %stenosed.  There is mild to moderate left ventricular systolic dysfunction.  LV end diastolic pressure is severely elevated.  The left ventricular ejection fraction is 45-50% by visual estimate.     CARDIOVASCULAR SURGERY OPERATIVE NOTE 07/12/2016 Preoperative Diagnosis:High grade ostial/proximal LADcoronary artery stenosis s/p acute NSTEMI Procedure: Emergent  1. Median Sternotomy 2. Extracorporeal circulation 3. Coronary artery bypass grafting x 1   Left internal mammary graft to the LAD    07/12/16 Conclusions  Result status: Final result   Left ventricle: Normal cavity size. LV systolic function is mildly reduced with an EF of 45-50%. Wall motion is abnormal. Mid, anterolateral wall motion is hypokinetic. Mildly  Septum: No Patent Foramen Ovale present.  Left atrium: Patent foramen ovale not present.  Aortic valve: The valve is trileaflet. No stenosis. No regurgitation.  Right ventricle: Normal cavity size and ejection fraction.  Tricuspid valve: Trace regurgitation.  Left ventricle: Normal cavity size.  Mitral valve: Trace regurgitation.       ASSESSMENT & PLAN:   CAD:  HTN:   Medication Adjustments/Labs and Tests Ordered: Current medicines are reviewed at length with the patient today.  Concerns regarding medicines are outlined above.  Medication changes, Labs and Tests ordered today are listed in the Patient Instructions below. There are no Patient Instructions on file for this visit.   Signed, Angelena Form, PA-C  08/01/2016 6:02 PM    [facility] [place], [place], [place]  [address] Phone: [phone]; Fax: [phone]

## 2016-08-04 NOTE — Progress Notes (Addendum)
Cardiology Office Note   Date:  08/05/2016   ID:  [name], DOB [birth year], MRN [id number]  PCP:  Melinda Crutch, MD  Cardiologist:  Dr. [name]    Chief Complaint  Patient presents with  . Hospitalization Follow-up    Post CABG      History of Present Illness: [name] is a 46 y.o. female who presents for post hospitalization for NSTEMI and single vessel CABG.    PT admitted 07/11/16 with chest pain.  PMH of HLD, anxiety and heart murmur.  Her EKG with SR and T wave inversions in ant. Lat views.  Troponin on admit was 5.47.  She was admitted and placed on IV heparin.  Cardiac cath the next day with 99% LM, ost to Prox LAD 08%, LV end diastolic pressure was severely elevated.  EF 45-50%.  She underwent urgent CABG with Dr. Cyndia Bent. CABG X 1 with LIMA to LAD.   She did well.  Placed on imdur for a month to prevent IMA spasm -"it appeared spastic with handling the vessel" but she developed headache and has hx of migraines.  Olene Floss op she was diuresed.  Her A1C was 6.5 with no prior hx of DM.      Since d/c she had lt LQ abd pain and saw PCP, unsure cause but has resolved.  Her BP was 67Y systolic so BB was stopped.    Today she has some DOE but has not been doing much activity except walking in the house.  No swelling, some incisional pain and occ Lt breast discomfort,  Also Lt shoulder discomfort.  Most likely all from surgery.  She will begin to exercise 5 min then 10 min daily.  Her appetite is ok, her wt is stable.  No palpitations.  Her glucose has been stable.  She has hx of anemia and was anemic at discharge.     Past Medical History:  Diagnosis Date  . Anemia    due to heavy periods  . Anxiety   . Coronary artery disease   . Difficult intubation 01/22/2016  . Headache   . Heart murmur    aschild antibiotics if having Dental work  . Hypocholesteremia    medical treatment  . Ischemic cardiomyopathy   . Menorrhagia   . NSTEMI (non-ST elevated myocardial infarction) ([place])     . Pelvic pain     Past Surgical History:  Procedure Laterality Date  . ABDOMINAL HYSTERECTOMY    . CORONARY ARTERY BYPASS GRAFT N/A 07/12/2016   Procedure: CORONARY ARTERY BYPASS GRAFTING (CABG), ON PUMP, TIMES ONE, USING LEFT INTERNAL MAMMARY ARTERY WITH TEE;  Surgeon: Rachel Pollack, MD;  Location: [facility];  Service: Open Heart Surgery;  Laterality: N/A;  LIMA to LAD  . goiter     left side goiter removed  . INGUINAL HERNIA REPAIR Bilateral 01/22/2016   Procedure: LAPAROSCOPIC BILATERAL FEMORAL AND RIGHT INGUINAL HERNIA REPAIRWITH INSERTION OF MESH;  Surgeon: Rachel Boston, MD;  Location: [facility];  Service: General;  Laterality: Bilateral;  . LEFT HEART CATH AND CORONARY ANGIOGRAPHY N/A 07/12/2016   Procedure: Left Heart Cath and Coronary Angiography;  Surgeon: Rachel Harp, MD;  Location: [facility];  Service: Cardiovascular;  Laterality: N/A;  . SVD     x 1  . WISDOM TOOTH EXTRACTION       Current Outpatient Prescriptions  Medication Sig Dispense Refill  . aspirin EC 325 MG EC tablet Take 1 tablet (325 mg total) by  mouth daily. 30 tablet 0  . atorvastatin (LIPITOR) 80 MG tablet Take 1 tablet (80 mg total) by mouth daily at 6 PM. 30 tablet 1  . baclofen (LIORESAL) 10 MG tablet Take 10 mg by mouth 2 (two) times daily as needed (headache). Limit to two days per week.    . zonisamide (ZONEGRAN) 100 MG capsule Take 100 mg by mouth at bedtime.     No current facility-administered medications for this visit.     Allergies:   Bactrim    Social History:  The patient  reports that she has never smoked. She has never used smokeless tobacco. She reports that she does not drink alcohol or use drugs.   Family History:  The patient's family history is not on file. She was adopted.    ROS:  General:no colds or fevers, no weight changes Skin:no rashes or ulcers HEENT:no blurred vision, no congestion CV:see HPI PUL:see HPI TI:RWER diarrhea and constipation or melena, no  indigestion GU:no hematuria, no dysuria MS:no joint pain, no claudication Neuro:no syncope, no lightheadedness Endo:borderline diabetes,to be followed by PCP  no thyroid disease  Wt Readings from Last 3 Encounters:  08/05/16 131 lb 12.8 oz (59.8 kg)  07/17/16 130 lb 9.6 oz (59.2 kg)  01/11/16 129 lb (58.5 kg)     PHYSICAL EXAM: VS:  BP 100/68   Pulse 77   Ht 5' (1.524 m)   Wt 131 lb 12.8 oz (59.8 kg)   LMP 08/10/2011   BMI 25.74 kg/m  , BMI Body mass index is 25.74 kg/m. General:Pleasant affect, NAD Skin:Warm and dry, brisk capillary refill HEENT:normocephalic, sclera clear, mucus membranes moist Neck:supple, no JVD, no bruits  Heart:S1S2 RRR without murmur, gallup, rub or click, chest incision healing well.  Lungs:clear without rales, rhonchi, or wheezes XVQ:MGQQ, non tender, + BS, do not palpate liver spleen or masses Ext:no lower ext edema, 2+ pedal pulses, 2+ radial pulses Neuro:alert and oriented X 3, MAE, follows commands, + facial symmetry    EKG:  EKG is ordered today. The ekg ordered today demonstrates SR T wave inversion ant. Leads. With ant Mi.    Recent Labs: 07/11/2016: ALT 69 07/12/2016: B Natriuretic Peptide 234.8; TSH 1.146 07/13/2016: Magnesium 2.5 07/15/2016: BUN 15; Creatinine, Ser 0.59; Hemoglobin 8.1; Platelets 167; Potassium 3.7; Sodium 135    Lipid Panel No results found for: CHOL, TRIG, HDL, CHOLHDL, VLDL, LDLCALC, LDLDIRECT     Other studies Reviewed: Additional studies/ records that were reviewed today include: . 07/12/16 cardiac cath Procedures   Left Heart Cath and Coronary Angiography  Conclusion     LM lesion, 99 %stenosed.  Ost LAD to Prox LAD lesion, 95 %stenosed.  There is mild to moderate left ventricular systolic dysfunction.  LV end diastolic pressure is severely elevated.  The left ventricular ejection fraction is 45-50% by visual estimate.     Intra op TEE Result status: Final result   Left ventricle: Normal  cavity size. LV systolic function is mildly reduced with an EF of 45-50%. Wall motion is abnormal. Mid, anterolateral wall motion is hypokinetic. Mildly  Septum: No Patent Foramen Ovale present.  Left atrium: Patent foramen ovale not present.  Aortic valve: The valve is trileaflet. No stenosis. No regurgitation.  Right ventricle: Normal cavity size and ejection fraction.  Tricuspid valve: Trace regurgitation.  Left ventricle: Normal cavity size.  Mitral valve: Trace regurgitation.     TTE 07/16/16 Study Conclusions  - Left ventricle: Wall thickness was increased in a pattern of  mild   LVH. Systolic function was mildly to moderately reduced. The   estimated ejection fraction was in the range of 40% to 45%.   Severe septal hypokinesis. Severe hypokinesis of the apex.   Features are consistent with a pseudonormal left ventricular   filling pattern, with concomitant abnormal relaxation and   increased filling pressure (grade 2 diastolic dysfunction). - Aortic valve: There was no stenosis. - Mitral valve: There was no significant regurgitation. - Right ventricle: The cavity size was normal. Systolic function   was normal. - Tricuspid valve: Peak RV-RA gradient (S): 29 mm Hg. - Pulmonary arteries: PA peak pressure: 32 mm Hg (S). - Systemic veins: IVC not visualized.  Impressions:  - Normal LV size with mild LVH, EF 40-45%. Severe septal   hypokinesis, severe hypokinesis of the apex. Normal RV size and   systolic function. No significant valvular abnormalities.   07/12/16  CABG by Dr. Cyndia Bent 1. Median Sternotomy 2. Extracorporeal circulation 3.   Coronary artery bypass grafting x 1   Left internal mammary graft to the LAD   ASSESSMENT AND PLAN:  1.  NSTEMI with single vessel CAD -99% LM disease and 95% ostial LAD.  Underwent urgent CABG.  LIMA to LAD. Slowly progressing.  Unable to use BB or ACE due to hypotension with meds.  Will add as pt progresses.  Cardiac  rehab after visit with Dr. Cyndia Bent  2. HLD- continue statin and recheck on next visit with Dr. [name]  3.  DM- borderline, followed by PCP  4. Blood loss anemia and Iron def anemia.  Recheck CBC  5. Ischemic cardiomyopathy.  Recheck echo as warranted per Dr. [name]  Follow up with Dr. Adora Fridge in 4-6 weeks.  6.  abd pain has seen PCP, change miralax to bene fiber.      Current medicines are reviewed with the patient today.  The patient Has no concerns regarding medicines.  The following changes have been made:  See above Labs/ tests ordered today include:see above  Disposition:   FU:  see above  Signed, Cecilie Kicks, NP  08/05/2016 12:15 PM    [address], [place], [place] [place], [place] Phone: [phone]; Fax: [phone]

## 2016-08-05 ENCOUNTER — Encounter: Payer: Federal, State, Local not specified - PPO | Admitting: Physician Assistant

## 2016-08-05 ENCOUNTER — Ambulatory Visit (INDEPENDENT_AMBULATORY_CARE_PROVIDER_SITE_OTHER): Payer: Federal, State, Local not specified - PPO | Admitting: Cardiology

## 2016-08-05 ENCOUNTER — Encounter: Payer: Self-pay | Admitting: Cardiology

## 2016-08-05 VITALS — BP 100/68 | HR 77 | Ht 60.0 in | Wt 131.8 lb

## 2016-08-05 DIAGNOSIS — I255 Ischemic cardiomyopathy: Secondary | ICD-10-CM | POA: Diagnosis not present

## 2016-08-05 DIAGNOSIS — Z951 Presence of aortocoronary bypass graft: Secondary | ICD-10-CM

## 2016-08-05 DIAGNOSIS — E782 Mixed hyperlipidemia: Secondary | ICD-10-CM

## 2016-08-05 DIAGNOSIS — E876 Hypokalemia: Secondary | ICD-10-CM

## 2016-08-05 DIAGNOSIS — I2510 Atherosclerotic heart disease of native coronary artery without angina pectoris: Secondary | ICD-10-CM

## 2016-08-05 DIAGNOSIS — I214 Non-ST elevation (NSTEMI) myocardial infarction: Secondary | ICD-10-CM

## 2016-08-05 DIAGNOSIS — D509 Iron deficiency anemia, unspecified: Secondary | ICD-10-CM

## 2016-08-05 NOTE — Patient Instructions (Signed)
Cecilie Kicks, NP, recommends that you continue on your current medications as directed. Please refer to the Current Medication list given to you today.  Your physician recommends that you return for lab work TODAY.  Mickel Baas recommends that you schedule a follow-up appointment in 4-6 weeks with Dr [name].  If you need a refill on your cardiac medications before your next appointment, please call your pharmacy.

## 2016-08-06 LAB — BASIC METABOLIC PANEL
BUN/Creatinine Ratio: 18 (ref 9–23)
BUN: 11 mg/dL (ref 6–24)
CO2: 19 mmol/L (ref 18–29)
CREATININE: 0.6 mg/dL (ref 0.57–1.00)
Calcium: 9 mg/dL (ref 8.7–10.2)
Chloride: 102 mmol/L (ref 96–106)
GFR calc Af Amer: 125 mL/min/{1.73_m2} (ref >59)
GFR calc non Af Amer: 108 mL/min/{1.73_m2} (ref >59)
GLUCOSE: 133 mg/dL — AB (ref 65–99)
Potassium: 4.2 mmol/L (ref 3.5–5.2)
Sodium: 139 mmol/L (ref 134–144)

## 2016-08-06 LAB — CBC
Hematocrit: 32.2 % — ABNORMAL LOW (ref 34.0–46.6)
Hemoglobin: 10.6 g/dL — ABNORMAL LOW (ref 11.1–15.9)
MCH: 27.9 pg (ref 26.6–33.0)
MCHC: 32.9 g/dL (ref 31.5–35.7)
MCV: 85 fL (ref 79–97)
Platelets: 358 10*3/uL (ref 150–379)
RBC: 3.8 x10E6/uL (ref 3.77–5.28)
RDW: 15.4 % (ref 12.3–15.4)
WBC: 5.8 10*3/uL (ref 3.4–10.8)

## 2016-08-11 DIAGNOSIS — G43001 Migraine without aura, not intractable, with status migrainosus: Secondary | ICD-10-CM | POA: Diagnosis not present

## 2016-08-11 DIAGNOSIS — G44329 Chronic post-traumatic headache, not intractable: Secondary | ICD-10-CM | POA: Diagnosis not present

## 2016-08-12 ENCOUNTER — Other Ambulatory Visit: Payer: Self-pay | Admitting: Surgery

## 2016-08-12 DIAGNOSIS — Z951 Presence of aortocoronary bypass graft: Secondary | ICD-10-CM

## 2016-08-13 ENCOUNTER — Encounter: Payer: Self-pay | Admitting: Surgery

## 2016-08-13 ENCOUNTER — Ambulatory Visit (INDEPENDENT_AMBULATORY_CARE_PROVIDER_SITE_OTHER): Payer: Self-pay | Admitting: Surgery

## 2016-08-13 ENCOUNTER — Ambulatory Visit
Admission: RE | Admit: 2016-08-13 | Discharge: 2016-08-13 | Disposition: A | Discharge status: Home / Self Care | Payer: Federal, State, Local not specified - PPO | Source: Ambulatory Visit | Attending: Surgery | Admitting: Surgery

## 2016-08-13 VITALS — BP 100/60 | HR 71 | Resp 20 | Ht 60.0 in | Wt 130.0 lb

## 2016-08-13 DIAGNOSIS — Z951 Presence of aortocoronary bypass graft: Secondary | ICD-10-CM

## 2016-08-13 DIAGNOSIS — I517 Cardiomegaly: Secondary | ICD-10-CM | POA: Diagnosis not present

## 2016-08-13 DIAGNOSIS — I2510 Atherosclerotic heart disease of native coronary artery without angina pectoris: Secondary | ICD-10-CM

## 2016-08-13 NOTE — Progress Notes (Signed)
     HPI: Patient returns for routine postoperative follow-up having undergone emergent CABG x 1 using a LIMA to the LAD for high grade ostial and proximal LAD stenosis after acute NSTEMI on 07/12/2016. The patient's early postoperative recovery while in the hospital was notable for an uncomplicated postop course. Since hospital discharge the patient reports that she has been gradually feeling better. She is walking some in the house and going out to stores with her husband. She has mild chest wall discomfort. Her beta blocker was stopped due to low normal BP.   Current Outpatient Prescriptions  Medication Sig Dispense Refill  . aspirin EC 325 MG EC tablet Take 1 tablet (325 mg total) by mouth daily. 30 tablet 0  . atorvastatin (LIPITOR) 80 MG tablet Take 1 tablet (80 mg total) by mouth daily at 6 PM. 30 tablet 1  . baclofen (LIORESAL) 10 MG tablet Take 10 mg by mouth 2 (two) times daily as needed (headache). Limit to two days per week.    . zonisamide (ZONEGRAN) 100 MG capsule Take 100 mg by mouth at bedtime.     No current facility-administered medications for this visit.     Physical Exam: BP 100/60   Pulse 71   Resp 20   Ht 5' (1.524 m)   Wt 130 lb (59 kg)   LMP 08/10/2011   SpO2 96% Comment: RA  BMI 25.39 kg/m  She looks well. Lung exam is clear. Cardiac exam shows a regular rate and rhythm with normal heart sounds. Chest incision is healing well and sternum is stable. There is no peripheral edema.   Diagnostic Tests:  CLINICAL DATA:  History of CABG.  Sore.  EXAM: CHEST  2 VIEW  COMPARISON:  07/14/2016  FINDINGS: The heart is moderately enlarged. Postoperative changes are noted. Postoperative changes at the left lung base have improved. Right lung is clear. No pneumothorax. Tiny left pleural effusion cannot be excluded.  IMPRESSION: Improved postop changes at the left base. Tiny left pleural effusion cannot be excluded.  Cardiomegaly without  decompensation.   Electronically Signed   By: Marybelle Killings M.D.   On: 08/13/2016 09:34   Impression:  Overall I think she is doing well. I encouraged her to continue walking. She is not sure about attending cardiac rehab. I told her that she could drive a car but should not lift anything heavier than 10 lbs for three months postop. She wants to know when she can return to work but I don't think her stamina is at a level to return to work at this time and she will have to see how she feels over the next few weeks. She does not have to do any physically strenuous activity at work.    Plan:  She has a follow up appt with Dr. [name] in a few weeks. She will return to see me if she has any problems with her incisions or needs to discuss returning to work.   Gaye Pollack, MD [facility] [phone]

## 2016-08-14 ENCOUNTER — Telehealth (HOSPITAL_COMMUNITY): Payer: Self-pay | Admitting: Family Medicine

## 2016-08-14 NOTE — Telephone Encounter (Addendum)
Verified BCBS insurance benefits through [employer] $30.00, Coinsurance 30%, No Deductible  Out of Pocket $5500.00, pt has met $774.02, pt's responsibility [phone].  Reference [id number]..... KJ  S/w pt about our CRP and she wanted to call me back tomorrow I said yes and she uses my chart so I sent her a msg verifying insurance benefits. KJ

## 2016-08-20 ENCOUNTER — Telehealth (HOSPITAL_COMMUNITY): Payer: Self-pay | Admitting: Family Medicine

## 2016-08-21 DIAGNOSIS — F430 Acute stress reaction: Secondary | ICD-10-CM | POA: Diagnosis not present

## 2016-08-30 ENCOUNTER — Encounter (HOSPITAL_COMMUNITY): Payer: Self-pay | Admitting: *Deleted

## 2016-08-30 ENCOUNTER — Emergency Department (HOSPITAL_COMMUNITY): Payer: Federal, State, Local not specified - PPO

## 2016-08-30 ENCOUNTER — Emergency Department (HOSPITAL_COMMUNITY)
Admission: EM | Admit: 2016-08-30 | Discharge: 2016-08-30 | Disposition: A | Discharge status: Home / Self Care | Payer: Federal, State, Local not specified - PPO | Attending: Emergency Medicine | Admitting: Emergency Medicine

## 2016-08-30 DIAGNOSIS — I2510 Atherosclerotic heart disease of native coronary artery without angina pectoris: Secondary | ICD-10-CM | POA: Diagnosis not present

## 2016-08-30 DIAGNOSIS — R42 Dizziness and giddiness: Secondary | ICD-10-CM | POA: Diagnosis not present

## 2016-08-30 DIAGNOSIS — R0602 Shortness of breath: Secondary | ICD-10-CM

## 2016-08-30 DIAGNOSIS — R5383 Other fatigue: Secondary | ICD-10-CM | POA: Diagnosis not present

## 2016-08-30 DIAGNOSIS — Z951 Presence of aortocoronary bypass graft: Secondary | ICD-10-CM | POA: Insufficient documentation

## 2016-08-30 DIAGNOSIS — Z7982 Long term (current) use of aspirin: Secondary | ICD-10-CM | POA: Diagnosis not present

## 2016-08-30 DIAGNOSIS — I252 Old myocardial infarction: Secondary | ICD-10-CM | POA: Insufficient documentation

## 2016-08-30 DIAGNOSIS — R0789 Other chest pain: Secondary | ICD-10-CM | POA: Insufficient documentation

## 2016-08-30 LAB — BASIC METABOLIC PANEL
ANION GAP: 9 (ref 5–15)
BUN: 9 mg/dL (ref 6–20)
CALCIUM: 9.4 mg/dL (ref 8.9–10.3)
CO2: 23 mmol/L (ref 22–32)
Chloride: 109 mmol/L (ref 101–111)
Creatinine, Ser: 0.63 mg/dL (ref 0.44–1.00)
GLUCOSE: 106 mg/dL — AB (ref 65–99)
Potassium: 3.7 mmol/L (ref 3.5–5.1)
SODIUM: 141 mmol/L (ref 135–145)

## 2016-08-30 LAB — CBC
HCT: 38.5 % (ref 36.0–46.0)
HEMOGLOBIN: 12.4 g/dL (ref 12.0–15.0)
MCH: 27.4 pg (ref 26.0–34.0)
MCHC: 32.2 g/dL (ref 30.0–36.0)
MCV: 85 fL (ref 78.0–100.0)
Platelets: 303 10*3/uL (ref 150–400)
RBC: 4.53 MIL/uL (ref 3.87–5.11)
RDW: 14.2 % (ref 11.5–15.5)
WBC: 5.1 10*3/uL (ref 4.0–10.5)

## 2016-08-30 LAB — I-STAT TROPONIN, ED
TROPONIN I, POC: 0.01 ng/mL (ref 0.00–0.08)
Troponin i, poc: 0.01 ng/mL (ref 0.00–0.08)

## 2016-08-30 LAB — BRAIN NATRIURETIC PEPTIDE: B Natriuretic Peptide: 252.4 pg/mL — ABNORMAL HIGH (ref 0.0–100.0)

## 2016-08-30 MED ORDER — IOPAMIDOL (ISOVUE-370) INJECTION 76%
INTRAVENOUS | Status: AC
  Administered 2016-08-30: 100 mL
  Filled 2016-08-30: qty 100

## 2016-08-30 NOTE — ED Notes (Signed)
Pt is in stable condition upon d/c and ambulates from ED. 

## 2016-08-30 NOTE — ED Triage Notes (Addendum)
Pt reports recent hx of CABG. Returned to work this week, having fatigue, sob and cough. Denies swelling to legs or fever. Sent here from [facility]. spo2 100% and ekg done.

## 2016-08-30 NOTE — ED Provider Notes (Signed)
[facility] DEPT Provider Note   CSN: [id number] Arrival date & time: 08/30/16  1338     History   Chief Complaint Chief Complaint  Patient presents with  . Shortness of Breath    HPI [name] is a 46 y.o. female.  The history is provided by the patient and medical records.  Shortness of Breath  This is a new problem. The average episode lasts 7 days. The problem occurs intermittently.The current episode started more than 1 week ago. The problem has been gradually worsening. Associated symptoms include chest pain. Pertinent negatives include no fever, no headaches, no coryza, no rhinorrhea, no sore throat, no swollen glands, no ear pain, no neck pain, no cough, no sputum production, no hemoptysis, no wheezing, no orthopnea, no syncope, no vomiting, no abdominal pain, no rash, no leg pain, no leg swelling and no claudication. Precipitated by: Pt started back to work this week. Risk factors: Recent CABG. She has tried nothing for the symptoms. She has had prior hospitalizations. She has had prior ED visits. Associated medical issues include CAD, past MI and recent surgery. Associated medical issues do not include pneumonia, chronic lung disease, PE or DVT.    Past Medical History:  Diagnosis Date  . Anemia    due to heavy periods  . Anxiety   . Coronary artery disease   . Difficult intubation 01/22/2016  . Headache   . Heart murmur    aschild antibiotics if having Dental work  . Hypocholesteremia    medical treatment  . Ischemic cardiomyopathy   . Menorrhagia   . NSTEMI (non-ST elevated myocardial infarction) ([place])   . Pelvic pain     Patient Active Problem List   Diagnosis Date Noted  . S/P CABG x 1 07/12/2016  . NSTEMI (non-ST elevated myocardial infarction) ([place]) 07/11/2016    Past Surgical History:  Procedure Laterality Date  . ABDOMINAL HYSTERECTOMY    . CORONARY ARTERY BYPASS GRAFT N/A 07/12/2016   Procedure: CORONARY ARTERY BYPASS GRAFTING (CABG), ON PUMP,  TIMES ONE, USING LEFT INTERNAL MAMMARY ARTERY WITH TEE;  Surgeon: Gaye Pollack, MD;  Location: [facility];  Service: Open Heart Surgery;  Laterality: N/A;  LIMA to LAD  . goiter     left side goiter removed  . INGUINAL HERNIA REPAIR Bilateral 01/22/2016   Procedure: LAPAROSCOPIC BILATERAL FEMORAL AND RIGHT INGUINAL HERNIA REPAIRWITH INSERTION OF MESH;  Surgeon: Michael Boston, MD;  Location: [facility];  Service: General;  Laterality: Bilateral;  . LEFT HEART CATH AND CORONARY ANGIOGRAPHY N/A 07/12/2016   Procedure: Left Heart Cath and Coronary Angiography;  Surgeon: Lorretta Harp, MD;  Location: [facility];  Service: Cardiovascular;  Laterality: N/A;  . SVD     x 1  . WISDOM TOOTH EXTRACTION      OB History    No data available       Home Medications    Prior to Admission medications   Medication Sig Start Date End Date Taking? Authorizing Provider  aspirin EC 325 MG EC tablet Take 1 tablet (325 mg total) by mouth daily. 07/17/16  Yes Wayne E Gold, PA-C  atorvastatin (LIPITOR) 80 MG tablet Take 1 tablet (80 mg total) by mouth daily at 6 PM. 07/17/16  Yes Wayne E Gold, PA-C  baclofen (LIORESAL) 10 MG tablet Take 10 mg by mouth 2 (two) times daily as needed (headache). Limit to two days per week. 12/06/15  Yes Historical Provider, MD  zonisamide (ZONEGRAN) 100 MG capsule Take  100 mg by mouth at bedtime.   Yes Historical Provider, MD    Family History Family History  Problem Relation Age of Onset  . Adopted: Yes    Social History Social History  Substance Use Topics  . Smoking status: Never Smoker  . Smokeless tobacco: Never Used  . Alcohol use No     Allergies   Bactrim   Review of Systems Review of Systems  Constitutional: Positive for activity change and fatigue. Negative for chills and fever.  HENT: Negative for ear pain, rhinorrhea and sore throat.   Eyes: Negative for pain and visual disturbance.  Respiratory: Positive for shortness of breath. Negative for cough,  hemoptysis, sputum production and wheezing.   Cardiovascular: Positive for chest pain. Negative for palpitations, orthopnea, claudication, leg swelling and syncope.  Gastrointestinal: Negative for abdominal pain and vomiting.  Genitourinary: Negative for dysuria and hematuria.  Musculoskeletal: Negative for arthralgias, back pain and neck pain.  Skin: Negative for color change and rash.  Neurological: Positive for light-headedness. Negative for seizures, syncope and headaches.  All other systems reviewed and are negative.    Physical Exam Updated Vital Signs BP 117/72   Pulse 72   Temp 98.7 F (37.1 C) (Oral)   Resp 16   Ht 5' (1.524 m)   Wt 58.2 kg   LMP 08/10/2011   SpO2 99%   BMI 25.06 kg/m   Physical Exam  Constitutional: She appears well-developed and well-nourished. No distress.  HENT:  Head: Normocephalic and atraumatic.  Eyes: Conjunctivae and EOM are normal. Pupils are equal, round, and reactive to light.  Neck: Neck supple.  Cardiovascular: Normal rate and regular rhythm.   No murmur heard. Pulmonary/Chest: Effort normal and breath sounds normal. No respiratory distress.    Abdominal: Soft. There is no tenderness. There is no rebound and no guarding.  Musculoskeletal: She exhibits no edema.  Neurological: She is alert. GCS eye subscore is 4. GCS verbal subscore is 5. GCS motor subscore is 6.  Skin: Skin is warm and dry. No rash noted.     Psychiatric: She has a normal mood and affect. Her speech is normal.  Nursing note and vitals reviewed.    ED Treatments / Results  Labs (all labs ordered are listed, but only abnormal results are displayed) Labs Reviewed  BASIC METABOLIC PANEL - Abnormal; Notable for the following:       Result Value   Glucose, Bld 106 (*)    All other components within normal limits  BRAIN NATRIURETIC PEPTIDE - Abnormal; Notable for the following:    B Natriuretic Peptide 252.4 (*)    All other components within normal limits    CBC  I-STAT TROPOININ, ED  I-STAT TROPOININ, ED    EKG  EKG Interpretation  Date/Time:  Saturday August 30 2016 13:43:36 EDT Ventricular Rate:  61 PR Interval:  158 QRS Duration: 58 QT Interval:  444 QTC Calculation: 446 R Axis:   125 Text Interpretation:  Normal sinus rhythm Low voltage QRS Septal infarct , age undetermined Lateral infarct , age undetermined Abnormal ECG Confirmed by RAY MD, Andee Poles [phone]) on 08/30/2016 2:29:12 PM       Radiology Dg Chest 2 View  Result Date: 08/30/2016 CLINICAL DATA:  Dizziness and shortness of breath. EXAM: CHEST  2 VIEW COMPARISON:  August 13, 2016 FINDINGS: Mild cardiomegaly. The hila and mediastinum are normal. Sternotomy wires are intact. No pulmonary nodules, masses, focal infiltrates, or overt edema. IMPRESSION: No active cardiopulmonary disease. Electronically Signed  By: Dorise Bullion III M.D   On: 08/30/2016 16:46   Ct Angio Chest Pe W And/or Wo Contrast  Result Date: 08/30/2016 CLINICAL DATA:  SOB and fatigue after recent CABG. Hx NSTEMI, ischemic cardiomyopathy, CAD, and left heart cath. EXAM: CT ANGIOGRAPHY CHEST WITH CONTRAST TECHNIQUE: Multidetector CT imaging of the chest was performed using the standard protocol during bolus administration of intravenous contrast. Multiplanar CT image reconstructions and MIPs were obtained to evaluate the vascular anatomy. CONTRAST:  80 cc Isovue 370 COMPARISON:  Chest x-ray from earlier same day. FINDINGS: Cardiovascular: Some of the most peripheral segmental and subsegmental pulmonary artery branches are difficult to definitively characterize due to patient breathing motion artifact, however, there is no pulmonary embolism identified within the main, lobar or central segmental pulmonary arteries bilaterally. Aortic atherosclerosis. No aortic aneurysm or evidence of aortic dissection. Mild cardiomegaly. No pericardial effusion. Surgical changes of recent CABG with expected mild edema in the anterior  mediastinum. Mediastinum/Nodes: No mass or enlarged lymph nodes within the mediastinum or perihilar regions. No circumscribed fluid collection. Esophagus appears normal. Trachea and central bronchi are unremarkable. Lungs/Pleura: Lungs are clear. No consolidation or pulmonary edema. No pleural effusion. No pneumothorax. Upper Abdomen: Limited images of the upper abdomen are unremarkable. Musculoskeletal: Mild degenerative spurring within the thoracic spine. Median sternotomy wires in place. No acute or suspicious osseous finding. Superficial soft tissues are unremarkable. Review of the MIP images confirms the above findings. IMPRESSION: 1. No pulmonary embolism identified, with mild study limitations detailed above. 2. Mild cardiomegaly. Surgical changes of recent CABG, with expected mild edema in the anterior mediastinum. No pericardial effusion. 3. Aortic atherosclerosis. 4. Lungs are clear.  No pneumonia or pulmonary edema. Electronically Signed   By: Franki Cabot M.D.   On: 08/30/2016 17:34    Procedures Procedures (including critical care time)  Medications Ordered in ED Medications  iopamidol (ISOVUE-370) 76 % injection (100 mLs  Contrast Given 08/30/16 1715)     Initial Impression / Assessment and Plan / ED Course  I have reviewed the triage vital signs and the nursing notes.  Pertinent labs & imaging results that were available during my care of the patient were reviewed by me and considered in my medical decision making (see chart for details).     46 year old female with history of CABG in 06/2016, hyper cholesterolemia who presents to the setting of fatigue, shortness of breath and mild chest discomfort. Patient reports she went back to work a week ago and since then has been feeling more fatigued. She reports she feels short of breath at baseline. She reports this is the symptom she felt before she had her CABG. She reports she was feeling better until she started work. She additionally  endorses some mild lightheadedness at times. Patient went to primary care physician today and decreasing Doses of acetaminophen emergency department for further evaluation.  On arrival patient was hemodynamically stable and afebrile. EKG obtained which revealed no signs of acute ischemia and improvement from previous EKG. Basic laboratory analysis revealed no electrolyte L Donati, anemia or elevation in initial troponin. Chest x-ray obtained which revealed no acute cardiopulmonary abnormality. BNP reveals slight elevation however no pleural effusions, leg swelling or other signs of fluid overload status noted on examination or imaging.  Due to patient's recent CABG discussed case with cardiology who recommended CT scan to evaluate for possible pulmonary embolus. They are in agreement if troponin 2 is not significantly elevated and this is likely symptoms related to patient's  recent CABG. Probably patient requires inpatient hospitalization or further evaluation.  CT scan revealed no signs of pulmonary embolus or other significant, patient from surgery. Repeat troponin was not significantly elevated. Patient without chest pain and remained asymmetric emergency department.  We will discharge home at this time with plan to call cardiology on Monday for close follow-up for likely symptoms related to heart failure in setting of CABG. Patient stable at time of discharge and encouraged to continue home medications. Strict return percussion is given a patient in agreement with plan.  Attending has seen and evaluate the patient and Dr. Ellender Hose is in agreement with plan.  Final Clinical Impressions(s) / ED Diagnoses   Final diagnoses:  Shortness of breath  Other fatigue  Lightheadedness  Chest tightness    New Prescriptions New Prescriptions   No medications on file     Esaw Grandchild, MD 08/30/16 1850    Duffy Bruce, MD 08/31/16 1205

## 2016-08-30 NOTE — ED Notes (Signed)
Patient transported to CT 

## 2016-09-02 ENCOUNTER — Telehealth: Payer: Self-pay | Admitting: Cardiovascular Disease

## 2016-09-02 NOTE — Telephone Encounter (Signed)
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[employer], [employer] Starting work half days last week Towards end of last week, started feeling fatigued, SOB, back hurting States these are same symptoms she felt prior to surgery She went to PCP and then [facility] on Saturday She was told there was nothing acute and should follow up with cardiologist She states it was discussed that she was probably overdoing it at work  Patient states she is feeling good - has been working since Sunday but taking it easy She is going to be out of town all of next week - no sooner appts this week  Advised to monitor symptoms and she can utilize our triage dept and/or on call providers for after hours/weekends concerns.   Will defer to Dr. [name] for further advice

## 2016-09-02 NOTE — Telephone Encounter (Signed)
New message      Pt had a heart attack in feb.  She has an appt with Dr [name] 09-17-16.  Talk to a nurse about some symptoms she is having and see if she needs to be seen sooner.

## 2016-09-03 DIAGNOSIS — F430 Acute stress reaction: Secondary | ICD-10-CM | POA: Diagnosis not present

## 2016-09-03 NOTE — Telephone Encounter (Signed)
If she has recurrent symptoms she can see mid-level provider for evaluation.

## 2016-09-12 DIAGNOSIS — F430 Acute stress reaction: Secondary | ICD-10-CM | POA: Diagnosis not present

## 2016-09-15 ENCOUNTER — Telehealth (HOSPITAL_COMMUNITY): Payer: Self-pay | Admitting: Family Medicine

## 2016-09-15 NOTE — Telephone Encounter (Signed)
*  Updated* [employer] - $30.00 co-payment, no deductible, out of pocket $5500/$925.51 has been met, 30% co-insurance, no pre-authorization and no limit on visit. Passport/reference [phone].

## 2016-09-16 ENCOUNTER — Encounter (HOSPITAL_COMMUNITY)
Admission: RE | Admit: 2016-09-16 | Discharge: 2016-09-16 | Disposition: A | Discharge status: Home / Self Care | Payer: Federal, State, Local not specified - PPO | Source: Ambulatory Visit | Attending: Cardiovascular Disease | Admitting: Cardiovascular Disease

## 2016-09-16 ENCOUNTER — Telehealth: Payer: Self-pay | Admitting: Cardiovascular Disease

## 2016-09-16 ENCOUNTER — Encounter (HOSPITAL_COMMUNITY): Payer: Self-pay

## 2016-09-16 ENCOUNTER — Ambulatory Visit: Payer: Federal, State, Local not specified - PPO | Admitting: Cardiovascular Disease

## 2016-09-16 VITALS — BP 112/74 | HR 79 | Ht 58.5 in | Wt 128.5 lb

## 2016-09-16 DIAGNOSIS — Z951 Presence of aortocoronary bypass graft: Secondary | ICD-10-CM | POA: Insufficient documentation

## 2016-09-16 DIAGNOSIS — I2510 Atherosclerotic heart disease of native coronary artery without angina pectoris: Secondary | ICD-10-CM | POA: Insufficient documentation

## 2016-09-16 DIAGNOSIS — F419 Anxiety disorder, unspecified: Secondary | ICD-10-CM | POA: Insufficient documentation

## 2016-09-16 DIAGNOSIS — I214 Non-ST elevation (NSTEMI) myocardial infarction: Secondary | ICD-10-CM | POA: Insufficient documentation

## 2016-09-16 MED ORDER — ATORVASTATIN CALCIUM 80 MG PO TABS: 80.0000 mg | ORAL_TABLET | Freq: Every day | ORAL | 6 refills | Status: DC

## 2016-09-16 NOTE — Telephone Encounter (Signed)
Rx for Atorvastatin sent to [facility]--6 month supply.  Pt has appt on 09/17/16.

## 2016-09-16 NOTE — Progress Notes (Signed)
Cardiac Individual Treatment Plan  Patient Details  Name: [name] MRN: [id number] Date of Birth: [birth year] Referring Provider:     CARDIAC REHAB PHASE II ORIENTATION from 09/16/2016 in [place]  Referring Provider  Jenkins Rouge MD      Initial Encounter Date:    CARDIAC REHAB PHASE II ORIENTATION from 09/16/2016 in [place]  Date  09/16/16  Referring Provider  Jenkins Rouge MD      Visit Diagnosis: 07/11/16 NSTEMI (non-ST elevated myocardial infarction) ([place])  07/12/16 S/P CABG x 1  Patient's Home Medications on Admission:  Current Outpatient Prescriptions:  .  aspirin EC 325 MG EC tablet, Take 1 tablet (325 mg total) by mouth daily., Disp: 30 tablet, Rfl: 0 .  atorvastatin (LIPITOR) 80 MG tablet, Take 1 tablet (80 mg total) by mouth daily at 6 PM., Disp: 30 tablet, Rfl: 6 .  baclofen (LIORESAL) 10 MG tablet, Take 10 mg by mouth 2 (two) times daily as needed (headache). Limit to two days per week., Disp: , Rfl:  .  zonisamide (ZONEGRAN) 100 MG capsule, Take 100 mg by mouth at bedtime., Disp: , Rfl:   Past Medical History: Past Medical History:  Diagnosis Date  . Anemia    due to heavy periods  . Anxiety   . Coronary artery disease   . Difficult intubation 01/22/2016  . Headache   . Heart murmur    aschild antibiotics if having Dental work  . Hypocholesteremia    medical treatment  . Ischemic cardiomyopathy   . Menorrhagia   . NSTEMI (non-ST elevated myocardial infarction) ([place])   . Pelvic pain     Tobacco Use: History  Smoking Status  . Never Smoker  Smokeless Tobacco  . Never Used    Labs: Recent Review Flowsheet Data    Labs for ITP Cardiac and Pulmonary Rehab Latest Ref Rng & Units 07/12/2016 07/12/2016 07/12/2016 07/12/2016 07/13/2016   Hemoglobin A1c 4.8 - 5.6 % - - - - -   PHART 7.350 - 7.450 7.411 7.311(L) 7.376 - -   PCO2ART 32.0 - 48.0 mmHg 31.9(L) 38.8 37.2 - -   HCO3 20.0 - 28.0  mmol/L 20.3 19.4(L) 21.6 - -   TCO2 0 - 100 mmol/L 21 21 23 22 20    ACIDBASEDEF 0.0 - 2.0 mmol/L 4.0(H) 6.0(H) 3.0(H) - -   O2SAT % 99.0 99.0 98.0 - -      Capillary Blood Glucose: Lab Results  Component Value Date   GLUCAP 118 (H) 07/17/2016   GLUCAP 116 (H) 07/16/2016   GLUCAP 146 (H) 07/16/2016   GLUCAP 135 (H) 07/16/2016   GLUCAP 102 (H) 07/16/2016     Exercise Target Goals: Date: 09/16/16  Exercise Program Goal: Individual exercise prescription set with THRR, safety & activity barriers. Participant demonstrates ability to understand and report RPE using BORG scale, to self-measure pulse accurately, and to acknowledge the importance of the exercise prescription.  Exercise Prescription Goal: Starting with aerobic activity 30 plus minutes a day, 3 days per week for initial exercise prescription. Provide home exercise prescription and guidelines that participant acknowledges understanding prior to discharge.  Activity Barriers & Risk Stratification:     Activity Barriers & Cardiac Risk Stratification - 09/16/16 1001      Activity Barriers & Cardiac Risk Stratification   Activity Barriers None   Cardiac Risk Stratification High      6 Minute Walk:     6 Minute Walk  [place] Name 09/16/16 1231         6 Minute Walk   Phase Initial     Distance 1003 feet     Walk Time 6 minutes     # of Rest Breaks 0     MPH 1.9     METS 3.39     RPE 9     VO2 Peak 11.88     Symptoms No     Resting HR 75 bpm     Resting BP 112/74     Max Ex. HR 104 bpm     Max Ex. BP 118/76     2 Minute Post BP 124/80        Oxygen Initial Assessment:   Oxygen Re-Evaluation:   Oxygen Discharge (Final Oxygen Re-Evaluation):   Initial Exercise Prescription:     Initial Exercise Prescription - 09/16/16 1200      Date of Initial Exercise RX and Referring Provider   Date 09/16/16   Referring Provider Jenkins Rouge MD     Bike   Level 0.4   Minutes 10   METs 2.37     NuStep    Level 2   SPM 70   Minutes 10   METs 2     Track   Laps 8   Minutes 10   METs 2.39     Prescription Details   Frequency (times per week) 3   Duration Progress to 30 minutes of continuous aerobic without signs/symptoms of physical distress     Intensity   THRR 40-80% of Max Heartrate 69-138   Ratings of Perceived Exertion 11-13   Perceived Dyspnea 0-4     Progression   Progression Continue to progress workloads to maintain intensity without signs/symptoms of physical distress.     Resistance Training   Training Prescription Yes   Weight 2lbs   Reps 10-15      Perform Capillary Blood Glucose checks as needed.  Exercise Prescription Changes:   Exercise Comments:   Exercise Goals and Review:     Exercise Goals    Row Name 09/16/16 1239             Exercise Goals   Increase Physical Activity Yes  Learn activity and exercise limitations       Intervention Provide advice, education, support and counseling about physical activity/exercise needs.;Develop an individualized exercise prescription for aerobic and resistive training based on initial evaluation findings, risk stratification, comorbidities and participant's personal goals.       Expected Outcomes Achievement of increased cardiorespiratory fitness and enhanced flexibility, muscular endurance and strength shown through measurements of functional capacity and personal statement of participant.       Increase Strength and Stamina Yes  Build confidence with exercise and activity       Intervention Provide advice, education, support and counseling about physical activity/exercise needs.;Develop an individualized exercise prescription for aerobic and resistive training based on initial evaluation findings, risk stratification, comorbidities and participant's personal goals.       Expected Outcomes Achievement of increased cardiorespiratory fitness and enhanced flexibility, muscular endurance and strength shown  through measurements of functional capacity and personal statement of participant.          Exercise Goals Re-Evaluation :    Discharge Exercise Prescription (Final Exercise Prescription Changes):   Nutrition:  Target Goals: Understanding of nutrition guidelines, daily intake of sodium 1500mg , cholesterol 200mg , calories 30% from fat and 7% or less from saturated fats, daily to have 5  or more servings of fruits and vegetables.  Biometrics:     Pre Biometrics - 09/16/16 1237      Pre Biometrics   Waist Circumference 30 inches   Hip Circumference 36 inches   Waist to Hip Ratio 0.83 %   Triceps Skinfold 32 mm   % Body Fat 36.7 %   Grip Strength 29 kg   Flexibility 7.5 in   Single Leg Stand 30 seconds       Nutrition Therapy Plan and Nutrition Goals:   Nutrition Discharge: Nutrition Scores:   Nutrition Goals Re-Evaluation:   Nutrition Goals Re-Evaluation:   Nutrition Goals Discharge (Final Nutrition Goals Re-Evaluation):   Psychosocial: Target Goals: Acknowledge presence or absence of significant depression and/or stress, maximize coping skills, provide positive support system. Participant is able to verbalize types and ability to use techniques and skills needed for reducing stress and depression.  Initial Review & Psychosocial Screening:     Initial Psych Review & Screening - 09/16/16 1600      Initial Review   Current issues with None Identified     Family Dynamics   Good Support System? Yes  Husband    Comments upon brief assessment, no psychosocial needs identified, no interventions necessary      Barriers   Psychosocial barriers to participate in program There are no identifiable barriers or psychosocial needs.     Screening Interventions   Interventions Encouraged to exercise;Provide feedback about the scores to participant      Quality of Life Scores:     Quality of Life - 09/16/16 1238      Quality of Life Scores   Health/Function  Pre 27.9 %   Socioeconomic Pre 28.21 %   Psych/Spiritual Pre 30 %   Family Pre 28.8 %   GLOBAL Pre 28.53 %      PHQ-9: Recent Review Flowsheet Data    There is no flowsheet data to display.     Interpretation of Total Score  Total Score Depression Severity:  1-4 = Minimal depression, 5-9 = Mild depression, 10-14 = Moderate depression, 15-19 = Moderately severe depression, 20-27 = Severe depression   Psychosocial Evaluation and Intervention:   Psychosocial Re-Evaluation:   Psychosocial Discharge (Final Psychosocial Re-Evaluation):   Vocational Rehabilitation: Provide vocational rehab assistance to qualifying candidates.   Vocational Rehab Evaluation & Intervention:     Vocational Rehab - 09/16/16 1558      Initial Vocational Rehab Evaluation & Intervention   Assessment shows need for Vocational Rehabilitation --  pt did not indicate       Education: Education Goals: Education classes will be provided on a weekly basis, covering required topics. Participant will state understanding/return demonstration of topics presented.  Learning Barriers/Preferences:     Learning Barriers/Preferences - 09/16/16 1004      Learning Barriers/Preferences   Learning Preferences Written Material;Skilled Demonstration;Pictoral      Education Topics: Count Your Pulse:  -Group instruction provided by verbal instruction, demonstration, patient participation and written materials to support subject.  Instructors address importance of being able to find your pulse and how to count your pulse when at home without a heart monitor.  Patients get hands on experience counting their pulse with staff help and individually.   Heart Attack, Angina, and Risk Factor Modification:  -Group instruction provided by verbal instruction, video, and written materials to support subject.  Instructors address signs and symptoms of angina and heart attacks.    Also discuss risk factors for heart disease  and how to make changes to improve heart health risk factors.   Functional Fitness:  -Group instruction provided by verbal instruction, demonstration, patient participation, and written materials to support subject.  Instructors address safety measures for doing things around the house.  Discuss how to get up and down off the floor, how to pick things up properly, how to safely get out of a chair without assistance, and balance training.   Meditation and Mindfulness:  -Group instruction provided by verbal instruction, patient participation, and written materials to support subject.  Instructor addresses importance of mindfulness and meditation practice to help reduce stress and improve awareness.  Instructor also leads participants through a meditation exercise.    Stretching for Flexibility and Mobility:  -Group instruction provided by verbal instruction, patient participation, and written materials to support subject.  Instructors lead participants through series of stretches that are designed to increase flexibility thus improving mobility.  These stretches are additional exercise for major muscle groups that are typically performed during regular warm up and cool down.   Hands Only CPR Anytime:  -Group instruction provided by verbal instruction, video, patient participation and written materials to support subject.  Instructors co-teach with AHA video for hands only CPR.  Participants get hands on experience with mannequins.   Nutrition I class: Heart Healthy Eating:  -Group instruction provided by PowerPoint slides, verbal discussion, and written materials to support subject matter. The instructor gives an explanation and review of the Therapeutic Lifestyle Changes diet recommendations, which includes a discussion on lipid goals, dietary fat, sodium, fiber, plant stanol/sterol esters, sugar, and the components of a well-balanced, healthy diet.   Nutrition II class: Lifestyle Skills:   -Group instruction provided by PowerPoint slides, verbal discussion, and written materials to support subject matter. The instructor gives an explanation and review of label reading, grocery shopping for heart health, heart healthy recipe modifications, and ways to make healthier choices when eating out.   Diabetes Question & Answer:  -Group instruction provided by PowerPoint slides, verbal discussion, and written materials to support subject matter. The instructor gives an explanation and review of diabetes co-morbidities, pre- and post-prandial blood glucose goals, pre-exercise blood glucose goals, signs, symptoms, and treatment of hypoglycemia and hyperglycemia, and foot care basics.   Diabetes Blitz:  -Group instruction provided by PowerPoint slides, verbal discussion, and written materials to support subject matter. The instructor gives an explanation and review of the physiology behind type 1 and type 2 diabetes, diabetes medications and rational behind using different medications, pre- and post-prandial blood glucose recommendations and Hemoglobin A1c goals, diabetes diet, and exercise including blood glucose guidelines for exercising safely.    Portion Distortion:  -Group instruction provided by PowerPoint slides, verbal discussion, written materials, and food models to support subject matter. The instructor gives an explanation of serving size versus portion size, changes in portions sizes over the last 20 years, and what consists of a serving from each food group.   Stress Management:  -Group instruction provided by verbal instruction, video, and written materials to support subject matter.  Instructors review role of stress in heart disease and how to cope with stress positively.     Exercising on Your Own:  -Group instruction provided by verbal instruction, power point, and written materials to support subject.  Instructors discuss benefits of exercise, components of exercise,  frequency and intensity of exercise, and end points for exercise.  Also discuss use of nitroglycerin and activating EMS.  Review options of places to exercise outside of  rehab.  Review guidelines for sex with heart disease.   Cardiac Drugs I:  -Group instruction provided by verbal instruction and written materials to support subject.  Instructor reviews cardiac drug classes: antiplatelets, anticoagulants, beta blockers, and statins.  Instructor discusses reasons, side effects, and lifestyle considerations for each drug class.   Cardiac Drugs II:  -Group instruction provided by verbal instruction and written materials to support subject.  Instructor reviews cardiac drug classes: angiotensin converting enzyme inhibitors (ACE-I), angiotensin II receptor blockers (ARBs), nitrates, and calcium channel blockers.  Instructor discusses reasons, side effects, and lifestyle considerations for each drug class.   Anatomy and Physiology of the Circulatory System:  -Group instruction provided by verbal instruction, video, and written materials to support subject.  Reviews functional anatomy of heart, how it relates to various diagnoses, and what role the heart plays in the overall system.   Knowledge Questionnaire Score:     Knowledge Questionnaire Score - 09/16/16 1231      Knowledge Questionnaire Score   Pre Score 15/24      Core Components/Risk Factors/Patient Goals at Admission:     Personal Goals and Risk Factors at Admission - 09/16/16 1238      Core Components/Risk Factors/Patient Goals on Admission    Weight Management Weight Maintenance;Weight Loss;Yes   Intervention Weight Management: Develop a combined nutrition and exercise program designed to reach desired caloric intake, while maintaining appropriate intake of nutrient and fiber, sodium and fats, and appropriate energy expenditure required for the weight goal.;Weight Management: Provide education and appropriate resources to help  participant work on and attain dietary goals.;Weight Management/Obesity: Establish reasonable short term and long term weight goals.   Expected Outcomes Short Term: Continue to assess and modify interventions until short term weight is achieved;Long Term: Adherence to nutrition and physical activity/exercise program aimed toward attainment of established weight goal;Weight Maintenance: Understanding of the daily nutrition guidelines, which includes 25-35% calories from fat, 7% or less cal from saturated fats, less than 200mg  cholesterol, less than 1.5gm of sodium, & 5 or more servings of fruits and vegetables daily;Weight Loss: Understanding of general recommendations for a balanced deficit meal plan, which promotes 1-2 lb weight loss per week and includes a negative energy balance of [phone] kcal/d;Understanding recommendations for meals to include 15-35% energy as protein, 25-35% energy from fat, 35-60% energy from carbohydrates, less than 200mg  of dietary cholesterol, 20-35 gm of total fiber daily;Understanding of distribution of calorie intake throughout the day with the consumption of 4-5 meals/snacks   Lipids Yes   Intervention Provide education and support for participant on nutrition & aerobic/resistive exercise along with prescribed medications to achieve LDL 70mg , HDL >40mg .   Expected Outcomes Short Term: Participant states understanding of desired cholesterol values and is compliant with medications prescribed. Participant is following exercise prescription and nutrition guidelines.;Long Term: Cholesterol controlled with medications as prescribed, with individualized exercise RX and with personalized nutrition plan. Value goals: LDL < 70mg , HDL > 40 mg.      Core Components/Risk Factors/Patient Goals Review:    Core Components/Risk Factors/Patient Goals at Discharge (Final Review):    ITP Comments:     ITP Comments    Row Name 09/16/16 0928           ITP Comments Medical  Director, Dr. Fransico Him          Comments: Patient attended orientation from [phone] to 1043  to review rules and guidelines for program. Completed 6 minute walk test, Intitial ITP, and exercise prescription.  VSS. Telemetry-sinus rhythm.    Asymptomatic.

## 2016-09-17 ENCOUNTER — Ambulatory Visit (INDEPENDENT_AMBULATORY_CARE_PROVIDER_SITE_OTHER): Payer: Federal, State, Local not specified - PPO | Admitting: Cardiovascular Disease

## 2016-09-17 ENCOUNTER — Encounter: Payer: Self-pay | Admitting: Cardiovascular Disease

## 2016-09-17 VITALS — BP 112/76 | HR 86 | Ht 60.0 in | Wt 130.0 lb

## 2016-09-17 DIAGNOSIS — I214 Non-ST elevation (NSTEMI) myocardial infarction: Secondary | ICD-10-CM | POA: Diagnosis not present

## 2016-09-17 DIAGNOSIS — E7800 Pure hypercholesterolemia, unspecified: Secondary | ICD-10-CM | POA: Diagnosis not present

## 2016-09-17 DIAGNOSIS — I255 Ischemic cardiomyopathy: Secondary | ICD-10-CM

## 2016-09-17 MED ORDER — ATORVASTATIN CALCIUM 40 MG PO TABS
40.0000 mg | ORAL_TABLET | Freq: Every day | ORAL | 6 refills | Status: DC
Start: 2016-09-17 — End: 2017-01-22

## 2016-09-17 MED ORDER — ATORVASTATIN CALCIUM 40 MG PO TABS: 40.0000 mg | ORAL_TABLET | Freq: Every day | ORAL | 6 refills | Status: DC

## 2016-09-17 NOTE — Assessment & Plan Note (Signed)
Ms. Ellefson is a 46 year old [place] female who had a non-STEMI 07/12/16. I catheterized her radially revealing a 95% ostial LAD with moderate LV dysfunction and anteroapical wall motion and mallet. She underwent emergent coronary artery bypass grafting 1 by Dr. Cyndia Bent. with a LIMA to her LAD. Her postop course was uneventful. She basically had no cardiac risk factors. She is attempting to start cardiac rehabilitation. I'm going to recheck a 2-D echo for assessment of improvement of LV function. I will see back in 6 months.

## 2016-09-17 NOTE — Progress Notes (Signed)
09/17/2016 [name]   [birth year]  [id number]  Primary Physician Rachel Crutch, MD Primary Cardiologist: Rachel Harp MD [name]  HPI:  [name] is a delightful 46 year old mildly overweight married Caucasian female mother of one 23 year old daughter who is at [facility]U and this was accompanied by her husband [name] today. She is being seen for her first post operative visit having recently undergone coronary artery bypass grafting times one emergently by Dr. Cyndia [name] on 07/12/16. The remainder of her coronary arteries were unremarkable. She did have a non-STEMI on that day with progressively rising troponins and EKG changes. She had a 99% ostial LAD with anteroapical wall motion abnormality. She is recuperating nicely. She is about to start cardiac rehabilitation. She is on proper medications including aspirin and statin therapy.   Current Outpatient Prescriptions  Medication Sig Dispense Refill  . aspirin EC 325 MG EC tablet Take 1 tablet (325 mg total) by mouth daily. 30 tablet 0  . atorvastatin (LIPITOR) 40 MG tablet Take 1 tablet (40 mg total) by mouth daily at 6 PM. 30 tablet 6  . baclofen (LIORESAL) 10 MG tablet Take 10 mg by mouth 2 (two) times daily as needed (headache). Limit to two days per week.    . zonisamide (ZONEGRAN) 100 MG capsule Take 100 mg by mouth at bedtime.     No current facility-administered medications for this visit.     Allergies  Allergen Reactions  . Bactrim Itching and Rash         Social History   Social History  . Marital status: Married    Spouse name: N/A  . Number of children: N/A  . Years of education: N/A   Occupational History  . Not on file.   Social History Main Topics  . Smoking status: Never Smoker  . Smokeless tobacco: Never Used  . Alcohol use No  . Drug use: No  . Sexual activity: Yes    Birth control/ protection: None   Other Topics Concern  . Not on file   Social History Narrative  . No narrative on file       Review of Systems: General: negative for chills, fever, night sweats or weight changes.  Cardiovascular: negative for chest pain, dyspnea on exertion, edema, orthopnea, palpitations, paroxysmal nocturnal dyspnea or shortness of breath Dermatological: negative for rash Respiratory: negative for cough or wheezing Urologic: negative for hematuria Abdominal: negative for nausea, vomiting, diarrhea, bright red blood per rectum, melena, or hematemesis Neurologic: negative for visual changes, syncope, or dizziness All other systems reviewed and are otherwise negative except as noted above.    Blood pressure 112/76, pulse 86, height 5' (1.524 m), weight 130 lb (59 kg), last menstrual period 08/10/2011.  General appearance: alert and no distress Neck: no adenopathy, no carotid bruit, no JVD, supple, symmetrical, trachea midline and thyroid not enlarged, symmetric, no tenderness/mass/nodules Lungs: clear to auscultation bilaterally Heart: Soft outflow tract murmur Extremities: extremities normal, atraumatic, no cyanosis or edema  EKG not performed today  ASSESSMENT AND PLAN:   NSTEMI (non-ST elevated myocardial infarction) [facility]) [name] is a 46 year old [place] female who had a non-STEMI 07/12/16. I catheterized her radially revealing a 95% ostial LAD with moderate LV dysfunction and anteroapical wall motion and mallet. She underwent emergent coronary artery bypass grafting 1 by Dr. Cyndia [name]. with a LIMA to her LAD. Her postop course was uneventful. She basically had no cardiac risk factors. She is attempting to start cardiac rehabilitation. I'm  going to recheck a 2-D echo for assessment of improvement of LV function. I will see back in 6 months.      Rachel Harp MD FACP,FACC,FAHA, [facility] 09/17/2016 9:40 AM

## 2016-09-17 NOTE — Patient Instructions (Signed)
Medication Instructions: Decrease Atorvastatin to 40 mg daily.   Labwork: Your physician recommends that you return for a FASTING lipid profile and hepatic function panel in July.   Testing/Procedures: Your physician has requested that you have an echocardiogram in 1 month. Echocardiography is a painless test that uses sound waves to create images of your heart. It provides your doctor with information about the size and shape of your heart and how well your heart's chambers and valves are working. This procedure takes approximately one hour. There are no restrictions for this procedure.  Follow-Up: Your physician wants you to follow-up in: 6 months with Dr. [name]. You will receive a reminder letter in the mail two months in advance. If you don't receive a letter, please call our office to schedule the follow-up appointment.  If you need a refill on your cardiac medications before your next appointment, please call your pharmacy.

## 2016-09-24 ENCOUNTER — Encounter (HOSPITAL_COMMUNITY): Payer: Self-pay

## 2016-09-24 ENCOUNTER — Encounter (HOSPITAL_COMMUNITY)
Admission: RE | Admit: 2016-09-24 | Discharge: 2016-09-24 | Disposition: A | Discharge status: Home / Self Care | Payer: Federal, State, Local not specified - PPO | Source: Ambulatory Visit | Attending: Cardiovascular Disease | Admitting: Cardiovascular Disease

## 2016-09-24 DIAGNOSIS — F419 Anxiety disorder, unspecified: Secondary | ICD-10-CM | POA: Diagnosis not present

## 2016-09-24 DIAGNOSIS — I2510 Atherosclerotic heart disease of native coronary artery without angina pectoris: Secondary | ICD-10-CM | POA: Insufficient documentation

## 2016-09-24 DIAGNOSIS — I214 Non-ST elevation (NSTEMI) myocardial infarction: Secondary | ICD-10-CM | POA: Insufficient documentation

## 2016-09-24 DIAGNOSIS — Z951 Presence of aortocoronary bypass graft: Secondary | ICD-10-CM | POA: Diagnosis not present

## 2016-09-24 NOTE — Progress Notes (Signed)
Daily Session Note  Patient Details  Name: [name] MRN: [id number] Date of Birth: [birth year] Referring Provider:     CARDIAC REHAB PHASE II ORIENTATION from 09/16/2016 in [place]  Referring Provider  Jenkins Rouge MD      Encounter Date: 09/24/2016  Check In:     Session Check In - 09/24/16 0926      Check-In   Location [facility]   Staff Present Cleda Mccreedy, MS, Exercise Physiologist;Maria Whitaker, RN, BSN;Amber Fair, MS, ACSM RCEP, Exercise Physiologist;Carlette Wilber Oliphant, RN, BSN   Supervising physician immediately available to respond to emergencies [facility] immediately available   Physician(s) Dr. Sloan Leiter   Medication changes reported     No   Fall or balance concerns reported    No   Tobacco Cessation No Change   Warm-up and Cool-down Performed as group-led instruction   Resistance Training Performed No   VAD Patient? No     Pain Assessment   Currently in Pain? No/denies   Multiple Pain Sites No      Capillary Blood Glucose: No results found for this or any previous visit (from the past 24 hour(s)).    History  Smoking Status  . Never Smoker  Smokeless Tobacco  . Never Used    Goals Met:  Exercise tolerated well  Goals Unmet:  Not Applicable  Comments: Pt started cardiac rehab today.  Pt tolerated light exercise without difficulty. VSS, telemetry-sinus rhythm, asymptomatic.  Medication list reconciled. Pt denies barriers to medicaiton compliance.  PSYCHOSOCIAL ASSESSMENT:  Deferred on this visit. However upon brief assessment, No psychosocial needs identified at this time, no psychosocial interventions necessary.  Pt oriented to exercise equipment and routine.    Understanding verbalized.   Dr. Fransico Him is Medical Director for Cardiac Rehab at [facility].

## 2016-09-26 ENCOUNTER — Encounter (HOSPITAL_COMMUNITY)
Admission: RE | Admit: 2016-09-26 | Discharge: 2016-09-26 | Disposition: A | Discharge status: Home / Self Care | Payer: Federal, State, Local not specified - PPO | Source: Ambulatory Visit | Attending: Cardiovascular Disease | Admitting: Cardiovascular Disease

## 2016-09-26 DIAGNOSIS — Z951 Presence of aortocoronary bypass graft: Secondary | ICD-10-CM | POA: Diagnosis not present

## 2016-09-26 DIAGNOSIS — F430 Acute stress reaction: Secondary | ICD-10-CM | POA: Diagnosis not present

## 2016-09-26 DIAGNOSIS — I2510 Atherosclerotic heart disease of native coronary artery without angina pectoris: Secondary | ICD-10-CM | POA: Diagnosis not present

## 2016-09-26 DIAGNOSIS — I214 Non-ST elevation (NSTEMI) myocardial infarction: Secondary | ICD-10-CM

## 2016-09-26 DIAGNOSIS — F419 Anxiety disorder, unspecified: Secondary | ICD-10-CM | POA: Diagnosis not present

## 2016-10-01 ENCOUNTER — Encounter (HOSPITAL_COMMUNITY)
Admission: RE | Admit: 2016-10-01 | Discharge: 2016-10-01 | Disposition: A | Discharge status: Home / Self Care | Payer: Federal, State, Local not specified - PPO | Source: Ambulatory Visit | Attending: Cardiovascular Disease | Admitting: Cardiovascular Disease

## 2016-10-01 DIAGNOSIS — F419 Anxiety disorder, unspecified: Secondary | ICD-10-CM | POA: Diagnosis not present

## 2016-10-01 DIAGNOSIS — I214 Non-ST elevation (NSTEMI) myocardial infarction: Secondary | ICD-10-CM

## 2016-10-01 DIAGNOSIS — Z951 Presence of aortocoronary bypass graft: Secondary | ICD-10-CM | POA: Diagnosis not present

## 2016-10-01 DIAGNOSIS — I2510 Atherosclerotic heart disease of native coronary artery without angina pectoris: Secondary | ICD-10-CM | POA: Diagnosis not present

## 2016-10-03 ENCOUNTER — Encounter (HOSPITAL_COMMUNITY)
Admission: RE | Admit: 2016-10-03 | Discharge: 2016-10-03 | Disposition: A | Discharge status: Home / Self Care | Payer: Federal, State, Local not specified - PPO | Source: Ambulatory Visit | Attending: Cardiovascular Disease | Admitting: Cardiovascular Disease

## 2016-10-03 DIAGNOSIS — Z951 Presence of aortocoronary bypass graft: Secondary | ICD-10-CM | POA: Diagnosis not present

## 2016-10-03 DIAGNOSIS — F419 Anxiety disorder, unspecified: Secondary | ICD-10-CM | POA: Diagnosis not present

## 2016-10-03 DIAGNOSIS — I214 Non-ST elevation (NSTEMI) myocardial infarction: Secondary | ICD-10-CM | POA: Diagnosis not present

## 2016-10-03 DIAGNOSIS — I2510 Atherosclerotic heart disease of native coronary artery without angina pectoris: Secondary | ICD-10-CM | POA: Diagnosis not present

## 2016-10-08 ENCOUNTER — Encounter (HOSPITAL_COMMUNITY)
Admission: RE | Admit: 2016-10-08 | Discharge: 2016-10-08 | Disposition: A | Discharge status: Home / Self Care | Payer: Federal, State, Local not specified - PPO | Source: Ambulatory Visit | Attending: Cardiovascular Disease | Admitting: Cardiovascular Disease

## 2016-10-08 DIAGNOSIS — F419 Anxiety disorder, unspecified: Secondary | ICD-10-CM | POA: Diagnosis not present

## 2016-10-08 DIAGNOSIS — Z951 Presence of aortocoronary bypass graft: Secondary | ICD-10-CM | POA: Diagnosis not present

## 2016-10-08 DIAGNOSIS — I214 Non-ST elevation (NSTEMI) myocardial infarction: Secondary | ICD-10-CM

## 2016-10-08 DIAGNOSIS — I2510 Atherosclerotic heart disease of native coronary artery without angina pectoris: Secondary | ICD-10-CM | POA: Diagnosis not present

## 2016-10-08 NOTE — Progress Notes (Signed)
[name] 46 y.o. female Nutrition Note Spoke with pt. Nutrition Plan and Nutrition Survey goals reviewed with pt. Pt is following Step 2 of the Therapeutic Lifestyle Changes diet. Pt wants to maintain her wt. Elevated A1c in DM range noted x 1. Per discussion, pt was told she was "pre-diabetic right before I left the hospital." Pt was adopted as a child and is unaware of her family's PMH. Pt states she discussed her A1c with her PCP and was told "we will check your A1c again next year." Pt reports she used to consume "a lot of sugar and I stopped doing that after I was told I was pre-diabetic." The role of carbohydrates in DM discussed. Pt expressed understanding of the information reviewed. Pt aware of nutrition education classes offered and plans on attending nutrition classes.  Lab Results  Component Value Date   HGBA1C 6.5 (H) 07/12/2016   Wt Readings from Last 3 Encounters:  09/17/16 130 lb (59 kg)  09/16/16 128 lb 8.5 oz (58.3 kg)  08/30/16 128 lb 4.8 oz (58.2 kg)    Nutrition Diagnosis ? Food-and nutrition-related knowledge deficit related to lack of exposure to information as related to diagnosis of: ? CVD ? Pre-DM  Nutrition Intervention ? Pt's individual nutrition plan reviewed with pt. ? Benefits of adopting Therapeutic Lifestyle Changes discussed when Medficts reviewed. ? Pt to attend the Portion Distortion class ? Pt to attend the Diabetes Q & A class ? Pt to attend the   ? Nutrition I class                     ? Nutrition II class   ? Pt given handouts for: ? Nutrition I class ? Nutrition II class ? pre-diabetes ? Continue client-centered nutrition education by RD, as part of interdisciplinary care. Goal(s) ? Pt to understand the basics of a DM diet (e.g. Be able to name foods that affect blood glucose) Monitor and Evaluate progress toward nutrition goal with team. Derek Mound, M.Ed, RD, LDN, CDE 10/08/2016 10:47 AM

## 2016-10-10 ENCOUNTER — Encounter (HOSPITAL_COMMUNITY)
Admission: RE | Admit: 2016-10-10 | Discharge: 2016-10-10 | Disposition: A | Discharge status: Home / Self Care | Payer: Federal, State, Local not specified - PPO | Source: Ambulatory Visit | Attending: Cardiovascular Disease | Admitting: Cardiovascular Disease

## 2016-10-10 DIAGNOSIS — Z951 Presence of aortocoronary bypass graft: Secondary | ICD-10-CM | POA: Diagnosis not present

## 2016-10-10 DIAGNOSIS — F419 Anxiety disorder, unspecified: Secondary | ICD-10-CM | POA: Diagnosis not present

## 2016-10-10 DIAGNOSIS — I2510 Atherosclerotic heart disease of native coronary artery without angina pectoris: Secondary | ICD-10-CM | POA: Diagnosis not present

## 2016-10-10 DIAGNOSIS — I214 Non-ST elevation (NSTEMI) myocardial infarction: Secondary | ICD-10-CM | POA: Diagnosis not present

## 2016-10-10 NOTE — Progress Notes (Signed)
Reviewed home exercise with pt today.  Pt plans to use elliptical for exercise.  Suggested starting at 5 minutes 3x/day. Discussed progressing time to build on aerobic capacity. Goal is 10 min. 3x/day for a cumulative of 30 minutes total. Reviewed THR, pulse, RPE, sign and symptoms, and when to call 911 or MD.  Also discussed weather considerations and indoor options.  Pt voiced understanding.    [employer], ACSM RCEP

## 2016-10-15 ENCOUNTER — Encounter (HOSPITAL_COMMUNITY)
Admission: RE | Admit: 2016-10-15 | Discharge: 2016-10-15 | Disposition: A | Discharge status: Home / Self Care | Payer: Federal, State, Local not specified - PPO | Source: Ambulatory Visit | Attending: Cardiovascular Disease | Admitting: Cardiovascular Disease

## 2016-10-15 DIAGNOSIS — I2510 Atherosclerotic heart disease of native coronary artery without angina pectoris: Secondary | ICD-10-CM | POA: Diagnosis not present

## 2016-10-15 DIAGNOSIS — I214 Non-ST elevation (NSTEMI) myocardial infarction: Secondary | ICD-10-CM

## 2016-10-15 DIAGNOSIS — F419 Anxiety disorder, unspecified: Secondary | ICD-10-CM | POA: Diagnosis not present

## 2016-10-15 DIAGNOSIS — Z951 Presence of aortocoronary bypass graft: Secondary | ICD-10-CM | POA: Diagnosis not present

## 2016-10-17 ENCOUNTER — Encounter (HOSPITAL_COMMUNITY): Admission: RE | Admit: 2016-10-17 | Payer: Federal, State, Local not specified - PPO | Source: Ambulatory Visit

## 2016-10-21 ENCOUNTER — Ambulatory Visit (HOSPITAL_COMMUNITY): Payer: Federal, State, Local not specified - PPO | Attending: Cardiovascular Disease

## 2016-10-21 ENCOUNTER — Other Ambulatory Visit: Payer: Self-pay

## 2016-10-21 DIAGNOSIS — R29898 Other symptoms and signs involving the musculoskeletal system: Secondary | ICD-10-CM | POA: Diagnosis not present

## 2016-10-21 DIAGNOSIS — I255 Ischemic cardiomyopathy: Secondary | ICD-10-CM | POA: Insufficient documentation

## 2016-10-21 DIAGNOSIS — I252 Old myocardial infarction: Secondary | ICD-10-CM | POA: Diagnosis not present

## 2016-10-21 MED ORDER — PERFLUTREN LIPID MICROSPHERE
1.0000 mL | INTRAVENOUS | Status: AC | PRN
  Administered 2016-10-21: 2 mL via INTRAVENOUS

## 2016-10-22 ENCOUNTER — Encounter (HOSPITAL_COMMUNITY)
Admission: RE | Admit: 2016-10-22 | Discharge: 2016-10-22 | Disposition: A | Discharge status: Home / Self Care | Payer: Federal, State, Local not specified - PPO | Source: Ambulatory Visit | Attending: Cardiovascular Disease | Admitting: Cardiovascular Disease

## 2016-10-22 DIAGNOSIS — I2510 Atherosclerotic heart disease of native coronary artery without angina pectoris: Secondary | ICD-10-CM | POA: Diagnosis not present

## 2016-10-22 DIAGNOSIS — I214 Non-ST elevation (NSTEMI) myocardial infarction: Secondary | ICD-10-CM | POA: Diagnosis not present

## 2016-10-22 DIAGNOSIS — Z951 Presence of aortocoronary bypass graft: Secondary | ICD-10-CM

## 2016-10-22 DIAGNOSIS — F419 Anxiety disorder, unspecified: Secondary | ICD-10-CM | POA: Diagnosis not present

## 2016-10-24 ENCOUNTER — Encounter (HOSPITAL_COMMUNITY)
Admission: RE | Admit: 2016-10-24 | Discharge: 2016-10-24 | Disposition: A | Discharge status: Home / Self Care | Payer: Federal, State, Local not specified - PPO | Source: Ambulatory Visit | Attending: Cardiovascular Disease | Admitting: Cardiovascular Disease

## 2016-10-24 DIAGNOSIS — F419 Anxiety disorder, unspecified: Secondary | ICD-10-CM | POA: Diagnosis not present

## 2016-10-24 DIAGNOSIS — Z951 Presence of aortocoronary bypass graft: Secondary | ICD-10-CM | POA: Diagnosis not present

## 2016-10-24 DIAGNOSIS — I214 Non-ST elevation (NSTEMI) myocardial infarction: Secondary | ICD-10-CM | POA: Diagnosis not present

## 2016-10-24 DIAGNOSIS — I2510 Atherosclerotic heart disease of native coronary artery without angina pectoris: Secondary | ICD-10-CM | POA: Diagnosis not present

## 2016-10-29 ENCOUNTER — Encounter (HOSPITAL_COMMUNITY)
Admission: RE | Admit: 2016-10-29 | Discharge: 2016-10-29 | Disposition: A | Discharge status: Home / Self Care | Payer: Federal, State, Local not specified - PPO | Source: Ambulatory Visit | Attending: Cardiovascular Disease | Admitting: Cardiovascular Disease

## 2016-10-29 DIAGNOSIS — I214 Non-ST elevation (NSTEMI) myocardial infarction: Secondary | ICD-10-CM | POA: Diagnosis not present

## 2016-10-29 DIAGNOSIS — F419 Anxiety disorder, unspecified: Secondary | ICD-10-CM | POA: Diagnosis not present

## 2016-10-29 DIAGNOSIS — Z951 Presence of aortocoronary bypass graft: Secondary | ICD-10-CM | POA: Diagnosis not present

## 2016-10-29 DIAGNOSIS — I2510 Atherosclerotic heart disease of native coronary artery without angina pectoris: Secondary | ICD-10-CM | POA: Diagnosis not present

## 2016-10-31 ENCOUNTER — Encounter (HOSPITAL_COMMUNITY)
Admission: RE | Admit: 2016-10-31 | Discharge: 2016-10-31 | Disposition: A | Discharge status: Home / Self Care | Payer: Federal, State, Local not specified - PPO | Source: Ambulatory Visit | Attending: Cardiovascular Disease | Admitting: Cardiovascular Disease

## 2016-10-31 DIAGNOSIS — I2510 Atherosclerotic heart disease of native coronary artery without angina pectoris: Secondary | ICD-10-CM | POA: Diagnosis not present

## 2016-10-31 DIAGNOSIS — Z951 Presence of aortocoronary bypass graft: Secondary | ICD-10-CM | POA: Diagnosis not present

## 2016-10-31 DIAGNOSIS — I214 Non-ST elevation (NSTEMI) myocardial infarction: Secondary | ICD-10-CM | POA: Diagnosis not present

## 2016-10-31 DIAGNOSIS — F419 Anxiety disorder, unspecified: Secondary | ICD-10-CM | POA: Diagnosis not present

## 2016-11-05 ENCOUNTER — Encounter (HOSPITAL_COMMUNITY)
Admission: RE | Admit: 2016-11-05 | Discharge: 2016-11-05 | Disposition: A | Discharge status: Home / Self Care | Payer: Federal, State, Local not specified - PPO | Source: Ambulatory Visit | Attending: Cardiovascular Disease | Admitting: Cardiovascular Disease

## 2016-11-05 DIAGNOSIS — I214 Non-ST elevation (NSTEMI) myocardial infarction: Secondary | ICD-10-CM

## 2016-11-05 DIAGNOSIS — Z951 Presence of aortocoronary bypass graft: Secondary | ICD-10-CM | POA: Diagnosis not present

## 2016-11-05 DIAGNOSIS — I2510 Atherosclerotic heart disease of native coronary artery without angina pectoris: Secondary | ICD-10-CM | POA: Diagnosis not present

## 2016-11-05 DIAGNOSIS — F419 Anxiety disorder, unspecified: Secondary | ICD-10-CM | POA: Diagnosis not present

## 2016-11-07 ENCOUNTER — Encounter (HOSPITAL_COMMUNITY)
Admission: RE | Admit: 2016-11-07 | Discharge: 2016-11-07 | Disposition: A | Discharge status: Home / Self Care | Payer: Federal, State, Local not specified - PPO | Source: Ambulatory Visit | Attending: Cardiovascular Disease | Admitting: Cardiovascular Disease

## 2016-11-07 DIAGNOSIS — Z951 Presence of aortocoronary bypass graft: Secondary | ICD-10-CM | POA: Diagnosis not present

## 2016-11-07 DIAGNOSIS — F419 Anxiety disorder, unspecified: Secondary | ICD-10-CM | POA: Diagnosis not present

## 2016-11-07 DIAGNOSIS — I214 Non-ST elevation (NSTEMI) myocardial infarction: Secondary | ICD-10-CM

## 2016-11-07 DIAGNOSIS — I2510 Atherosclerotic heart disease of native coronary artery without angina pectoris: Secondary | ICD-10-CM | POA: Diagnosis not present

## 2016-11-11 NOTE — Progress Notes (Signed)
Cardiac Individual Treatment Plan  Patient Details  Name: [name] MRN: [id number] Date of Birth: [birth year] Referring Provider:     CARDIAC REHAB PHASE II ORIENTATION from 09/16/2016 in [place]  Referring Provider  Jenkins Rouge MD      Initial Encounter Date:    CARDIAC REHAB PHASE II ORIENTATION from 09/16/2016 in [place]  Date  09/16/16  Referring Provider  Jenkins Rouge MD      Visit Diagnosis: 07/12/16 S/P CABG x 1  07/11/16 NSTEMI (non-ST elevated myocardial infarction) [facility])  Patient's Home Medications on Admission:  Current Outpatient Prescriptions:  .  aspirin EC 325 MG EC tablet, Take 1 tablet (325 mg total) by mouth daily., Disp: 30 tablet, Rfl: 0 .  atorvastatin (LIPITOR) 40 MG tablet, Take 1 tablet (40 mg total) by mouth daily at 6 PM., Disp: 30 tablet, Rfl: 6 .  baclofen (LIORESAL) 10 MG tablet, Take 10 mg by mouth 2 (two) times daily as needed (headache). Limit to two days per week., Disp: , Rfl:  .  zonisamide (ZONEGRAN) 100 MG capsule, Take 100 mg by mouth at bedtime., Disp: , Rfl:   Past Medical History: Past Medical History:  Diagnosis Date  . Anemia    due to heavy periods  . Anxiety   . Coronary artery disease   . Difficult intubation 01/22/2016  . Headache   . Heart murmur    aschild antibiotics if having Dental work  . Hypocholesteremia    medical treatment  . Ischemic cardiomyopathy   . Menorrhagia   . NSTEMI (non-ST elevated myocardial infarction) ([place])   . Pelvic pain     Tobacco Use: History  Smoking Status  . Never Smoker  Smokeless Tobacco  . Never Used    Labs: Recent Review Flowsheet Data    Labs for ITP Cardiac and Pulmonary Rehab Latest Ref Rng & Units 07/12/2016 07/12/2016 07/12/2016 07/12/2016 07/13/2016   Hemoglobin A1c 4.8 - 5.6 % - - - - -   PHART 7.350 - 7.450 7.411 7.311(L) 7.376 - -   PCO2ART 32.0 - 48.0 mmHg 31.9(L) 38.8 37.2 - -   HCO3 20.0 - 28.0  mmol/L 20.3 19.4(L) 21.6 - -   TCO2 0 - 100 mmol/L 21 21 23 22 20    ACIDBASEDEF 0.0 - 2.0 mmol/L 4.0(H) 6.0(H) 3.0(H) - -   O2SAT % 99.0 99.0 98.0 - -      Capillary Blood Glucose: Lab Results  Component Value Date   GLUCAP 118 (H) 07/17/2016   GLUCAP 116 (H) 07/16/2016   GLUCAP 146 (H) 07/16/2016   GLUCAP 135 (H) 07/16/2016   GLUCAP 102 (H) 07/16/2016     Exercise Target Goals:    Exercise Program Goal: Individual exercise prescription set with THRR, safety & activity barriers. Participant demonstrates ability to understand and report RPE using BORG scale, to self-measure pulse accurately, and to acknowledge the importance of the exercise prescription.  Exercise Prescription Goal: Starting with aerobic activity 30 plus minutes a day, 3 days per week for initial exercise prescription. Provide home exercise prescription and guidelines that participant acknowledges understanding prior to discharge.  Activity Barriers & Risk Stratification:     Activity Barriers & Cardiac Risk Stratification - 09/16/16 1001      Activity Barriers & Cardiac Risk Stratification   Activity Barriers None   Cardiac Risk Stratification High      6 Minute Walk:     6 Minute Walk  [place] Name 09/16/16 1231         6 Minute Walk   Phase Initial     Distance 1003 feet     Walk Time 6 minutes     # of Rest Breaks 0     MPH 1.9     METS 3.39     RPE 9     VO2 Peak 11.88     Symptoms No     Resting HR 75 bpm     Resting BP 112/74     Max Ex. HR 104 bpm     Max Ex. BP 118/76     2 Minute Post BP 124/80        Oxygen Initial Assessment:   Oxygen Re-Evaluation:   Oxygen Discharge (Final Oxygen Re-Evaluation):   Initial Exercise Prescription:     Initial Exercise Prescription - 09/16/16 1200      Date of Initial Exercise RX and Referring Provider   Date 09/16/16   Referring Provider Jenkins Rouge MD     Bike   Level 0.4   Minutes 10   METs 2.37     NuStep   Level 2    SPM 70   Minutes 10   METs 2     Track   Laps 8   Minutes 10   METs 2.39     Prescription Details   Frequency (times per week) 3   Duration Progress to 30 minutes of continuous aerobic without signs/symptoms of physical distress     Intensity   THRR 40-80% of Max Heartrate 69-138   Ratings of Perceived Exertion 11-13   Perceived Dyspnea 0-4     Progression   Progression Continue to progress workloads to maintain intensity without signs/symptoms of physical distress.     Resistance Training   Training Prescription Yes   Weight 2lbs   Reps 10-15      Perform Capillary Blood Glucose checks as needed.  Exercise Prescription Changes:     Exercise Prescription Changes    Row Name 09/29/16 1000 10/15/16 1100 10/28/16 1100         Response to Exercise   Blood Pressure (Admit) 118/80 118/70 120/80     Blood Pressure (Exercise) 138/80 112/80 124/80     Blood Pressure (Exit) 116/74 112/74 100/64     Heart Rate (Admit) 76 bpm 66 bpm 74 bpm     Heart Rate (Exercise) 115 bpm 131 bpm 137 bpm     Heart Rate (Exit) 76 bpm 68 bpm 74 bpm     Rating of Perceived Exertion (Exercise) 13 11 11      Symptoms none none none     Comments pt tolerated first week of cardiac rehab very well; Pt was oriented to exercise equipment pt tolerated first week of cardiac rehab very well; Pt was oriented to exercise equipment  -     Duration Continue with 30 min of aerobic exercise without signs/symptoms of physical distress. Continue with 30 min of aerobic exercise without signs/symptoms of physical distress. Continue with 30 min of aerobic exercise without signs/symptoms of physical distress.     Intensity THRR unchanged THRR unchanged THRR unchanged       Progression   Progression Continue to progress workloads to maintain intensity without signs/symptoms of physical distress. Continue to progress workloads to maintain intensity without signs/symptoms of physical distress. Continue to progress  workloads to maintain intensity without signs/symptoms of physical distress.     Average METs 2.5 3.1 3.3  Resistance Training   Training Prescription Yes Yes Yes     Weight 2lbs 2lbs 3lbs     Reps 10-15 10-15 10-15     Time 10 Minutes 10 Minutes 10 Minutes       Bike   Level 0.5 0.5 0.5     Minutes 10 10 10      METs 2.61 2.61 2.61       NuStep   Level 2 4 4      SPM 70 80 80     Minutes 10 10 10      METs 2 3.3 3.8       Rower   Level  -  - 1     Watts  -  - 11     METs  -  - 3.8       Track   Laps 10 13  -     Minutes 10 10  -     METs 2.74 3.26  -       Home Exercise Plan   Plans to continue exercise at  - Home (comment)  elliptical- starting at 5 min. multiple times a day Home (comment)  elliptical- starting at 5 min. multiple times a day     Frequency  - Add 3 additional days to program exercise sessions. Add 3 additional days to program exercise sessions.     Initial Home Exercises Provided  - 10/10/16 10/10/16        Exercise Comments:     Exercise Comments    Row Name 10/15/16 1102 11/10/16 1058         Exercise Comments Reviewed METs and goals. Pt is tolerating exercise well; will continue to monitor pt's activity levels/ exercise tolerance Reviewed METs and goals. Pt is tolerating exercise well; will continue to monitor pt's activity levels/ exercise tolerance         Exercise Goals and Review:     Exercise Goals    Row Name 09/16/16 1239             Exercise Goals   Increase Physical Activity Yes  Learn activity and exercise limitations       Intervention Provide advice, education, support and counseling about physical activity/exercise needs.;Develop an individualized exercise prescription for aerobic and resistive training based on initial evaluation findings, risk stratification, comorbidities and participant's personal goals.       Expected Outcomes Achievement of increased cardiorespiratory fitness and enhanced flexibility, muscular  endurance and strength shown through measurements of functional capacity and personal statement of participant.       Increase Strength and Stamina Yes  Build confidence with exercise and activity       Intervention Provide advice, education, support and counseling about physical activity/exercise needs.;Develop an individualized exercise prescription for aerobic and resistive training based on initial evaluation findings, risk stratification, comorbidities and participant's personal goals.       Expected Outcomes Achievement of increased cardiorespiratory fitness and enhanced flexibility, muscular endurance and strength shown through measurements of functional capacity and personal statement of participant.          Exercise Goals Re-Evaluation :     Exercise Goals Re-Evaluation    Row Name 10/10/16 1040 10/29/16 1045           Exercise Goal Re-Evaluation   Exercise Goals Review Increase Physical Activity;Increase Strenth and Stamina Increase Physical Activity;Increase Strenth and Stamina      Comments Reviewed home exercise with pt today.  Pt plans to use  elliptical for exercise.  Suggested starting at 5 minutes 3x/day. Discussed progressing time to build on aerobic capacity. Goal is 10 min. 3x/day for a cumulative of 30 minutes total. Reviewed THR, pulse, RPE, sign and symptoms, and when to call 911 or MD.  Also discussed weather considerations and indoor options.  Pt voiced understanding. pt is using elliptical at home following exercise prescription without difficulty. pt is enjoying using rower at CR.  pt feels activity is getting easier and more comfortable for her.       Expected Outcomes Pt will be compliant with HEP and improve in aerobic fitness level Pt will be compliant with HEP and improve in aerobic fitness level          Discharge Exercise Prescription (Final Exercise Prescription Changes):     Exercise Prescription Changes - 10/28/16 1100      Response to Exercise    Blood Pressure (Admit) 120/80   Blood Pressure (Exercise) 124/80   Blood Pressure (Exit) 100/64   Heart Rate (Admit) 74 bpm   Heart Rate (Exercise) 137 bpm   Heart Rate (Exit) 74 bpm   Rating of Perceived Exertion (Exercise) 11   Symptoms none   Duration Continue with 30 min of aerobic exercise without signs/symptoms of physical distress.   Intensity THRR unchanged     Progression   Progression Continue to progress workloads to maintain intensity without signs/symptoms of physical distress.   Average METs 3.3     Resistance Training   Training Prescription Yes   Weight 3lbs   Reps 10-15   Time 10 Minutes     Bike   Level 0.5   Minutes 10   METs 2.61     NuStep   Level 4   SPM 80   Minutes 10   METs 3.8     Rower   Level 1   Watts 11   METs 3.8     Home Exercise Plan   Plans to continue exercise at Home (comment)  elliptical- starting at 5 min. multiple times a day   Frequency Add 3 additional days to program exercise sessions.   Initial Home Exercises Provided 10/10/16      Nutrition:  Target Goals: Understanding of nutrition guidelines, daily intake of sodium 1500mg , cholesterol 200mg , calories 30% from fat and 7% or less from saturated fats, daily to have 5 or more servings of fruits and vegetables.  Biometrics:     Pre Biometrics - 09/16/16 1237      Pre Biometrics   Waist Circumference 30 inches   Hip Circumference 36 inches   Waist to Hip Ratio 0.83 %   Triceps Skinfold 32 mm   % Body Fat 36.7 %   Grip Strength 29 kg   Flexibility 7.5 in   Single Leg Stand 30 seconds       Nutrition Therapy Plan and Nutrition Goals:     Nutrition Therapy & Goals - 10/08/16 1045      Nutrition Therapy   Diet Carb Modified, Therapeutic Lifestyle Changes      Personal Nutrition Goals   Nutrition Goal Pt to have a basic understanding of a diabetic diet.      Intervention Plan   Intervention Prescribe, educate and counsel regarding individualized  specific dietary modifications aiming towards targeted core components such as weight, hypertension, lipid management, diabetes, heart failure and other comorbidities.;Nutrition handout(s) given to patient.  Pre-diabetes   Expected Outcomes Short Term Goal: Understand basic principles of dietary content, such  as calories, fat, sodium, cholesterol and nutrients.;Long Term Goal: Adherence to prescribed nutrition plan.      Nutrition Discharge: Nutrition Scores:     Nutrition Assessments - 10/08/16 1045      MEDFICTS Scores   Pre Score 15      Nutrition Goals Re-Evaluation:   Nutrition Goals Re-Evaluation:   Nutrition Goals Discharge (Final Nutrition Goals Re-Evaluation):   Psychosocial: Target Goals: Acknowledge presence or absence of significant depression and/or stress, maximize coping skills, provide positive support system. Participant is able to verbalize types and ability to use techniques and skills needed for reducing stress and depression.  Initial Review & Psychosocial Screening:     Initial Psych Review & Screening - 09/16/16 1600      Initial Review   Current issues with None Identified     Family Dynamics   Good Support System? Yes  Husband    Comments upon brief assessment, no psychosocial needs identified, no interventions necessary      Barriers   Psychosocial barriers to participate in program There are no identifiable barriers or psychosocial needs.     Screening Interventions   Interventions Encouraged to exercise;Provide feedback about the scores to participant      Quality of Life Scores:     Quality of Life - 09/26/16 1057      Quality of Life Scores   Health/Function Pre 27.9 %  QOL reviewed with pt.  DOE and fatigue are primary concerns.  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     Family  Pre --  pt has positive family relations and enjoys spending alone time reading, photography hobby and watching documentaries.     GLOBAL Pre --  overall pt discplaysgood coping skills with positive hopeful outlook and attitude.       PHQ-9: Recent Review Flowsheet Data    Depression screen [facility] 2/9 09/26/2016   Decreased Interest 0   Down, Depressed, Hopeless 0   PHQ - 2 Score 0     Interpretation of Total Score  Total Score Depression Severity:  1-4 = Minimal depression, 5-9 = Mild depression, 10-14 = Moderate depression, 15-19 = Moderately severe depression, 20-27 = Severe depression   Psychosocial Evaluation and Intervention:     Psychosocial Evaluation - 09/26/16 1044      Psychosocial Evaluation & Interventions   Interventions Encouraged to exercise with the program and follow exercise prescription;Stress management education   Comments health related anxiety.  pt is followed by personal therapist for prior situational stress.  pt overall displays positive outlook with good coping skills. pt feels confident in her ability to recover emotionally from her recent cardiac event.     Expected Outcomes pt will display positive outlood wtih good coping skkills.    Continue Psychosocial Services  Follow up required by staff      Psychosocial Re-Evaluation:     Psychosocial Re-Evaluation    [place] Name 10/29/16 1047             Psychosocial Re-Evaluation   Current issues with Current Anxiety/Panic       Comments health related anxiety is improving. pt reports feeling more comfortable and cofident with her CR activities.  pt consistently attending CR exercise and education classes.        Expected Outcomes pt will demonstrate positive outlook with good coping skills.  Interventions Stress management education;Encouraged to attend Cardiac Rehabilitation for the exercise;Relaxation education       Continue Psychosocial Services  Follow up required by staff           Psychosocial Discharge (Final Psychosocial Re-Evaluation):     Psychosocial Re-Evaluation - 10/29/16 1047      Psychosocial Re-Evaluation   Current issues with Current Anxiety/Panic   Comments health related anxiety is improving. pt reports feeling more comfortable and cofident with her CR activities.  pt consistently attending CR exercise and education classes.    Expected Outcomes pt will demonstrate positive outlook with good coping skills.   Interventions Stress management education;Encouraged to attend Cardiac Rehabilitation for the exercise;Relaxation education   Continue Psychosocial Services  Follow up required by staff      Vocational Rehabilitation: Provide vocational rehab assistance to qualifying candidates.   Vocational Rehab Evaluation & Intervention:     Vocational Rehab - 09/16/16 1558      Initial Vocational Rehab Evaluation & Intervention   Assessment shows need for Vocational Rehabilitation --  pt did not indicate       Education: Education Goals: Education classes will be provided on a weekly basis, covering required topics. Participant will state understanding/return demonstration of topics presented.  Learning Barriers/Preferences:     Learning Barriers/Preferences - 09/16/16 1004      Learning Barriers/Preferences   Learning Preferences Written Material;Skilled Demonstration;Pictoral      Education Topics: Count Your Pulse:  -Group instruction provided by verbal instruction, demonstration, patient participation and written materials to support subject.  Instructors address importance of being able to find your pulse and how to count your pulse when at home without a heart monitor.  Patients get hands on experience counting their pulse with staff help and individually.   CARDIAC REHAB PHASE II EXERCISE from 11/07/2016 in [place]  Date  10/24/16  Instruction Review Code  2- meets goals/outcomes      Heart  Attack, Angina, and Risk Factor Modification:  -Group instruction provided by verbal instruction, video, and written materials to support subject.  Instructors address signs and symptoms of angina and heart attacks.    Also discuss risk factors for heart disease and how to make changes to improve heart health risk factors.   CARDIAC REHAB PHASE II EXERCISE from 11/07/2016 in [place]  Date  10/15/16  Instruction Review Code  2- meets goals/outcomes      Functional Fitness:  -Group instruction provided by verbal instruction, demonstration, patient participation, and written materials to support subject.  Instructors address safety measures for doing things around the house.  Discuss how to get up and down off the floor, how to pick things up properly, how to safely get out of a chair without assistance, and balance training.   CARDIAC REHAB PHASE II EXERCISE from 11/07/2016 in [place]  Date  11/07/16  Instruction Review Code  2- meets goals/outcomes      Meditation and Mindfulness:  -Group instruction provided by verbal instruction, patient participation, and written materials to support subject.  Instructor addresses importance of mindfulness and meditation practice to help reduce stress and improve awareness.  Instructor also leads participants through a meditation exercise.    CARDIAC REHAB PHASE II EXERCISE from 11/07/2016 in [place]  Date  10/29/16  Instruction Review Code  2- meets goals/outcomes      Stretching for Flexibility and Mobility:  -  Group instruction provided by verbal instruction, patient participation, and written materials to support subject.  Instructors lead participants through series of stretches that are designed to increase flexibility thus improving mobility.  These stretches are additional exercise for major muscle groups that are typically performed during regular  warm up and cool down.   Hands Only CPR:  -Group verbal, video, and participation provides a basic overview of AHA guidelines for community CPR. Role-play of emergencies allow participants the opportunity to practice calling for help and chest compression technique with discussion of AED use.   Hypertension: -Group verbal and written instruction that provides a basic overview of hypertension including the most recent diagnostic guidelines, risk factor reduction with self-care instructions and medication management.   CARDIAC REHAB PHASE II EXERCISE from 11/07/2016 in [place]  Date  10/31/16  Instruction Review Code  2- meets goals/outcomes       Nutrition I class: Heart Healthy Eating:  -Group instruction provided by PowerPoint slides, verbal discussion, and written materials to support subject matter. The instructor gives an explanation and review of the Therapeutic Lifestyle Changes diet recommendations, which includes a discussion on lipid goals, dietary fat, sodium, fiber, plant stanol/sterol esters, sugar, and the components of a well-balanced, healthy diet.   CARDIAC REHAB PHASE II EXERCISE from 11/07/2016 in [place]  Date  10/08/16  Educator  RD  Instruction Review Code  Not applicable [class handouts given]      Nutrition II class: Lifestyle Skills:  -Group instruction provided by PowerPoint slides, verbal discussion, and written materials to support subject matter. The instructor gives an explanation and review of label reading, grocery shopping for heart health, heart healthy recipe modifications, and ways to make healthier choices when eating out.   CARDIAC REHAB PHASE II EXERCISE from 11/07/2016 in [place]  Date  10/08/16  Educator  RD  Instruction Review Code  Not applicable [class handouts given]      Diabetes Question & Answer:  -Group instruction provided by  PowerPoint slides, verbal discussion, and written materials to support subject matter. The instructor gives an explanation and review of diabetes co-morbidities, pre- and post-prandial blood glucose goals, pre-exercise blood glucose goals, signs, symptoms, and treatment of hypoglycemia and hyperglycemia, and foot care basics.   CARDIAC REHAB PHASE II EXERCISE from 11/07/2016 in [place]  Date  10/03/16  Educator  RD  Instruction Review Code  2- meets goals/outcomes      Diabetes Blitz:  -Group instruction provided by PowerPoint slides, verbal discussion, and written materials to support subject matter. The instructor gives an explanation and review of the physiology behind type 1 and type 2 diabetes, diabetes medications and rational behind using different medications, pre- and post-prandial blood glucose recommendations and Hemoglobin A1c goals, diabetes diet, and exercise including blood glucose guidelines for exercising safely.    Portion Distortion:  -Group instruction provided by PowerPoint slides, verbal discussion, written materials, and food models to support subject matter. The instructor gives an explanation of serving size versus portion size, changes in portions sizes over the last 20 years, and what consists of a serving from each food group.   Stress Management:  -Group instruction provided by verbal instruction, video, and written materials to support subject matter.  Instructors review role of stress in heart disease and how to cope with stress positively.     CARDIAC REHAB PHASE II EXERCISE from 11/07/2016 in [facility]  [place]  Date  09/24/16  Instruction Review Code  2- meets goals/outcomes      Exercising on Your Own:  -Group instruction provided by verbal instruction, power point, and written materials to support subject.  Instructors discuss benefits of exercise, components of exercise, frequency and intensity of  exercise, and end points for exercise.  Also discuss use of nitroglycerin and activating EMS.  Review options of places to exercise outside of rehab.  Review guidelines for sex with heart disease.   CARDIAC REHAB PHASE II EXERCISE from 11/07/2016 in [place]  Date  10/08/16  Instruction Review Code  2- meets goals/outcomes      Cardiac Drugs I:  -Group instruction provided by verbal instruction and written materials to support subject.  Instructor reviews cardiac drug classes: antiplatelets, anticoagulants, beta blockers, and statins.  Instructor discusses reasons, side effects, and lifestyle considerations for each drug class.   Cardiac Drugs II:  -Group instruction provided by verbal instruction and written materials to support subject.  Instructor reviews cardiac drug classes: angiotensin converting enzyme inhibitors (ACE-I), angiotensin II receptor blockers (ARBs), nitrates, and calcium channel blockers.  Instructor discusses reasons, side effects, and lifestyle considerations for each drug class.   CARDIAC REHAB PHASE II EXERCISE from 11/07/2016 in [place]  Date  11/05/16  Educator  Kennyth Lose  Instruction Review Code  2- meets goals/outcomes      Anatomy and Physiology of the Circulatory System:  Group verbal and written instruction and models provide basic cardiac anatomy and physiology, with the coronary electrical and arterial systems. Review of: AMI, Angina, Valve disease, Heart Failure, Peripheral Artery Disease, Cardiac Arrhythmia, [employer], and the [employer].   CARDIAC REHAB PHASE II EXERCISE from 11/07/2016 in [place]  Date  10/22/16  Instruction Review Code  2- meets goals/outcomes      Other Education:  -Group or individual verbal, written, or video instructions that support the educational goals of the cardiac rehab program.   Knowledge Questionnaire Score:     Knowledge  Questionnaire Score - 09/16/16 1231      Knowledge Questionnaire Score   Pre Score 15/24      Core Components/Risk Factors/Patient Goals at Admission:     Personal Goals and Risk Factors at Admission - 09/16/16 1238      Core Components/Risk Factors/Patient Goals on Admission    Weight Management Weight Maintenance;Weight Loss;Yes   Intervention Weight Management: Develop a combined nutrition and exercise program designed to reach desired caloric intake, while maintaining appropriate intake of nutrient and fiber, sodium and fats, and appropriate energy expenditure required for the weight goal.;Weight Management: Provide education and appropriate resources to help participant work on and attain dietary goals.;Weight Management/Obesity: Establish reasonable short term and long term weight goals.   Expected Outcomes Short Term: Continue to assess and modify interventions until short term weight is achieved;Long Term: Adherence to nutrition and physical activity/exercise program aimed toward attainment of established weight goal;Weight Maintenance: Understanding of the daily nutrition guidelines, which includes 25-35% calories from fat, 7% or less cal from saturated fats, less than 200mg  cholesterol, less than 1.5gm of sodium, & 5 or more servings of fruits and vegetables daily;Weight Loss: Understanding of general recommendations for a balanced deficit meal plan, which promotes 1-2 lb weight loss per week and includes a negative energy balance of [phone] kcal/d;Understanding recommendations for meals to include 15-35% energy as protein, 25-35% energy from fat, 35-60%  energy from carbohydrates, less than 200mg  of dietary cholesterol, 20-35 gm of total fiber daily;Understanding of distribution of calorie intake throughout the day with the consumption of 4-5 meals/snacks   Lipids Yes   Intervention Provide education and support for participant on nutrition & aerobic/resistive exercise along with  prescribed medications to achieve LDL 70mg , HDL >40mg .   Expected Outcomes Short Term: Participant states understanding of desired cholesterol values and is compliant with medications prescribed. Participant is following exercise prescription and nutrition guidelines.;Long Term: Cholesterol controlled with medications as prescribed, with individualized exercise RX and with personalized nutrition plan. Value goals: LDL < 70mg , HDL > 40 mg.      Core Components/Risk Factors/Patient Goals Review:      Goals and Risk Factor Review    Row Name 10/15/16 1617 10/29/16 1047           Core Components/Risk Factors/Patient Goals Review   Personal Goals Review Weight Management/Obesity;Lipids Weight Management/Obesity;Lipids      Review pt feels positive about her current progression in cardiac rehab.  pt is actively working towards weight loss and exertion goals.  pt reports her fatigue is improving .  pt interested in changing exercise routine to include alternate equipment such as rower/eliptical.   pt feels positive about her current progression in cardiac rehab.  pt is actively working towards weight loss and exertion goals.          Expected Outcomes pt will participate in CR exercise, nutrition and lifestyle education opportunities to decrease overall CAD risk factors.  pt will participate in CR exercise, nutrition and lifestyle education opportunities to decrease overall CAD risk factors.          Core Components/Risk Factors/Patient Goals at Discharge (Final Review):      Goals and Risk Factor Review - 10/29/16 1047      Core Components/Risk Factors/Patient Goals Review   Personal Goals Review Weight Management/Obesity;Lipids   Review pt feels positive about her current progression in cardiac rehab.  pt is actively working towards weight loss and exertion goals.       Expected Outcomes pt will participate in CR exercise, nutrition and lifestyle education opportunities to decrease overall  CAD risk factors.       ITP Comments:     ITP Comments    Row Name 09/16/16 0928 11/11/16 1601         ITP Comments Medical Director, Dr. Fransico Him Medical Director, Dr. Fransico Him         Comments: Pt is making expected progress toward personal goals after completing 14 sessions. Recommend continued exercise and life style modification education including  stress management and relaxation techniques to decrease cardiac risk profile.

## 2016-11-12 ENCOUNTER — Encounter (HOSPITAL_COMMUNITY)
Admission: RE | Admit: 2016-11-12 | Discharge: 2016-11-12 | Disposition: A | Discharge status: Home / Self Care | Payer: Federal, State, Local not specified - PPO | Source: Ambulatory Visit | Attending: Cardiovascular Disease | Admitting: Cardiovascular Disease

## 2016-11-12 DIAGNOSIS — Z951 Presence of aortocoronary bypass graft: Secondary | ICD-10-CM

## 2016-11-12 DIAGNOSIS — I2510 Atherosclerotic heart disease of native coronary artery without angina pectoris: Secondary | ICD-10-CM | POA: Diagnosis not present

## 2016-11-12 DIAGNOSIS — F419 Anxiety disorder, unspecified: Secondary | ICD-10-CM | POA: Diagnosis not present

## 2016-11-12 DIAGNOSIS — I214 Non-ST elevation (NSTEMI) myocardial infarction: Secondary | ICD-10-CM | POA: Diagnosis not present

## 2016-11-14 ENCOUNTER — Encounter (HOSPITAL_COMMUNITY)
Admission: RE | Admit: 2016-11-14 | Discharge: 2016-11-14 | Disposition: A | Discharge status: Home / Self Care | Payer: Federal, State, Local not specified - PPO | Source: Ambulatory Visit | Attending: Cardiovascular Disease | Admitting: Cardiovascular Disease

## 2016-11-14 DIAGNOSIS — Z951 Presence of aortocoronary bypass graft: Secondary | ICD-10-CM | POA: Diagnosis not present

## 2016-11-14 DIAGNOSIS — I214 Non-ST elevation (NSTEMI) myocardial infarction: Secondary | ICD-10-CM

## 2016-11-14 DIAGNOSIS — F419 Anxiety disorder, unspecified: Secondary | ICD-10-CM | POA: Diagnosis not present

## 2016-11-14 DIAGNOSIS — I2510 Atherosclerotic heart disease of native coronary artery without angina pectoris: Secondary | ICD-10-CM | POA: Diagnosis not present

## 2016-11-19 ENCOUNTER — Encounter (HOSPITAL_COMMUNITY)
Admission: RE | Admit: 2016-11-19 | Discharge: 2016-11-19 | Disposition: A | Discharge status: Home / Self Care | Payer: Federal, State, Local not specified - PPO | Source: Ambulatory Visit | Attending: Cardiovascular Disease | Admitting: Cardiovascular Disease

## 2016-11-19 DIAGNOSIS — Z951 Presence of aortocoronary bypass graft: Secondary | ICD-10-CM

## 2016-11-19 DIAGNOSIS — I2510 Atherosclerotic heart disease of native coronary artery without angina pectoris: Secondary | ICD-10-CM | POA: Diagnosis not present

## 2016-11-19 DIAGNOSIS — I214 Non-ST elevation (NSTEMI) myocardial infarction: Secondary | ICD-10-CM

## 2016-11-19 DIAGNOSIS — F419 Anxiety disorder, unspecified: Secondary | ICD-10-CM | POA: Diagnosis not present

## 2016-11-21 ENCOUNTER — Encounter (HOSPITAL_COMMUNITY): Payer: Federal, State, Local not specified - PPO

## 2016-11-28 ENCOUNTER — Encounter (HOSPITAL_COMMUNITY)
Admission: RE | Admit: 2016-11-28 | Discharge: 2016-11-28 | Disposition: A | Discharge status: Home / Self Care | Payer: Federal, State, Local not specified - PPO | Source: Ambulatory Visit | Attending: Cardiovascular Disease | Admitting: Cardiovascular Disease

## 2016-11-28 DIAGNOSIS — I214 Non-ST elevation (NSTEMI) myocardial infarction: Secondary | ICD-10-CM | POA: Diagnosis not present

## 2016-11-28 DIAGNOSIS — F419 Anxiety disorder, unspecified: Secondary | ICD-10-CM | POA: Diagnosis not present

## 2016-11-28 DIAGNOSIS — I2510 Atherosclerotic heart disease of native coronary artery without angina pectoris: Secondary | ICD-10-CM | POA: Insufficient documentation

## 2016-11-28 DIAGNOSIS — Z951 Presence of aortocoronary bypass graft: Secondary | ICD-10-CM | POA: Diagnosis not present

## 2016-12-03 ENCOUNTER — Encounter (HOSPITAL_COMMUNITY)
Admission: RE | Admit: 2016-12-03 | Discharge: 2016-12-03 | Disposition: A | Discharge status: Home / Self Care | Payer: Federal, State, Local not specified - PPO | Source: Ambulatory Visit | Attending: Cardiovascular Disease | Admitting: Cardiovascular Disease

## 2016-12-03 DIAGNOSIS — I2510 Atherosclerotic heart disease of native coronary artery without angina pectoris: Secondary | ICD-10-CM | POA: Diagnosis not present

## 2016-12-03 DIAGNOSIS — Z951 Presence of aortocoronary bypass graft: Secondary | ICD-10-CM

## 2016-12-03 DIAGNOSIS — F419 Anxiety disorder, unspecified: Secondary | ICD-10-CM | POA: Diagnosis not present

## 2016-12-03 DIAGNOSIS — I214 Non-ST elevation (NSTEMI) myocardial infarction: Secondary | ICD-10-CM

## 2016-12-05 ENCOUNTER — Encounter (HOSPITAL_COMMUNITY)
Admission: RE | Admit: 2016-12-05 | Discharge: 2016-12-05 | Disposition: A | Discharge status: Home / Self Care | Payer: Federal, State, Local not specified - PPO | Source: Ambulatory Visit | Attending: Cardiovascular Disease | Admitting: Cardiovascular Disease

## 2016-12-05 DIAGNOSIS — F419 Anxiety disorder, unspecified: Secondary | ICD-10-CM | POA: Diagnosis not present

## 2016-12-05 DIAGNOSIS — I214 Non-ST elevation (NSTEMI) myocardial infarction: Secondary | ICD-10-CM | POA: Diagnosis not present

## 2016-12-05 DIAGNOSIS — I2510 Atherosclerotic heart disease of native coronary artery without angina pectoris: Secondary | ICD-10-CM | POA: Diagnosis not present

## 2016-12-05 DIAGNOSIS — Z951 Presence of aortocoronary bypass graft: Secondary | ICD-10-CM | POA: Diagnosis not present

## 2016-12-09 NOTE — Progress Notes (Signed)
Cardiac Individual Treatment Plan  Patient Details  Name: [name] MRN: [id number] Date of Birth: [birth year] Referring Provider:     CARDIAC REHAB PHASE II ORIENTATION from 09/16/2016 in [place]  Referring Provider  Jenkins Rouge MD      Initial Encounter Date:    CARDIAC REHAB PHASE II ORIENTATION from 09/16/2016 in [place]  Date  09/16/16  Referring Provider  Jenkins Rouge MD      Visit Diagnosis: 07/11/16 NSTEMI (non-ST elevated myocardial infarction) ([place])  07/12/16 S/P CABG x 1  Patient's Home Medications on Admission:  Current Outpatient Prescriptions:  .  aspirin EC 325 MG EC tablet, Take 1 tablet (325 mg total) by mouth daily., Disp: 30 tablet, Rfl: 0 .  atorvastatin (LIPITOR) 40 MG tablet, Take 1 tablet (40 mg total) by mouth daily at 6 PM., Disp: 30 tablet, Rfl: 6 .  baclofen (LIORESAL) 10 MG tablet, Take 10 mg by mouth 2 (two) times daily as needed (headache). Limit to two days per week., Disp: , Rfl:  .  zonisamide (ZONEGRAN) 100 MG capsule, Take 100 mg by mouth at bedtime., Disp: , Rfl:   Past Medical History: Past Medical History:  Diagnosis Date  . Anemia    due to heavy periods  . Anxiety   . Coronary artery disease   . Difficult intubation 01/22/2016  . Headache   . Heart murmur    aschild antibiotics if having Dental work  . Hypocholesteremia    medical treatment  . Ischemic cardiomyopathy   . Menorrhagia   . NSTEMI (non-ST elevated myocardial infarction) ([place])   . Pelvic pain     Tobacco Use: History  Smoking Status  . Never Smoker  Smokeless Tobacco  . Never Used    Labs: Recent Review Flowsheet Data    Labs for ITP Cardiac and Pulmonary Rehab Latest Ref Rng & Units 07/12/2016 07/12/2016 07/12/2016 07/12/2016 07/13/2016   Hemoglobin A1c 4.8 - 5.6 % - - - - -   PHART 7.350 - 7.450 7.411 7.311(L) 7.376 - -   PCO2ART 32.0 - 48.0 mmHg 31.9(L) 38.8 37.2 - -   HCO3 20.0 - 28.0  mmol/L 20.3 19.4(L) 21.6 - -   TCO2 0 - 100 mmol/L 21 21 23 22 20    ACIDBASEDEF 0.0 - 2.0 mmol/L 4.0(H) 6.0(H) 3.0(H) - -   O2SAT % 99.0 99.0 98.0 - -      Capillary Blood Glucose: Lab Results  Component Value Date   GLUCAP 118 (H) 07/17/2016   GLUCAP 116 (H) 07/16/2016   GLUCAP 146 (H) 07/16/2016   GLUCAP 135 (H) 07/16/2016   GLUCAP 102 (H) 07/16/2016     Exercise Target Goals:    Exercise Program Goal: Individual exercise prescription set with THRR, safety & activity barriers. Participant demonstrates ability to understand and report RPE using BORG scale, to self-measure pulse accurately, and to acknowledge the importance of the exercise prescription.  Exercise Prescription Goal: Starting with aerobic activity 30 plus minutes a day, 3 days per week for initial exercise prescription. Provide home exercise prescription and guidelines that participant acknowledges understanding prior to discharge.  Activity Barriers & Risk Stratification:     Activity Barriers & Cardiac Risk Stratification - 09/16/16 1001      Activity Barriers & Cardiac Risk Stratification   Activity Barriers None   Cardiac Risk Stratification High      6 Minute Walk:     6 Minute Walk  [place] Name 09/16/16 1231         6 Minute Walk   Phase Initial     Distance 1003 feet     Walk Time 6 minutes     # of Rest Breaks 0     MPH 1.9     METS 3.39     RPE 9     VO2 Peak 11.88     Symptoms No     Resting HR 75 bpm     Resting BP 112/74     Max Ex. HR 104 bpm     Max Ex. BP 118/76     2 Minute Post BP 124/80        Oxygen Initial Assessment:   Oxygen Re-Evaluation:   Oxygen Discharge (Final Oxygen Re-Evaluation):   Initial Exercise Prescription:     Initial Exercise Prescription - 09/16/16 1200      Date of Initial Exercise RX and Referring Provider   Date 09/16/16   Referring Provider Jenkins Rouge MD     Bike   Level 0.4   Minutes 10   METs 2.37     NuStep   Level 2    SPM 70   Minutes 10   METs 2     Track   Laps 8   Minutes 10   METs 2.39     Prescription Details   Frequency (times per week) 3   Duration Progress to 30 minutes of continuous aerobic without signs/symptoms of physical distress     Intensity   THRR 40-80% of Max Heartrate 69-138   Ratings of Perceived Exertion 11-13   Perceived Dyspnea 0-4     Progression   Progression Continue to progress workloads to maintain intensity without signs/symptoms of physical distress.     Resistance Training   Training Prescription Yes   Weight 2lbs   Reps 10-15      Perform Capillary Blood Glucose checks as needed.  Exercise Prescription Changes:     Exercise Prescription Changes    Row Name 09/29/16 1000 10/15/16 1100 10/28/16 1100 11/19/16 0817 12/05/16 0800     Response to Exercise   Blood Pressure (Admit) 118/80 118/70 120/80 122/70 112/74   Blood Pressure (Exercise) 138/80 112/80 124/80 118/62 108/70   Blood Pressure (Exit) 116/74 112/74 100/64 122/80 122/78   Heart Rate (Admit) 76 bpm 66 bpm 74 bpm 72 bpm 65 bpm   Heart Rate (Exercise) 115 bpm 131 bpm 137 bpm 139 bpm 141 bpm   Heart Rate (Exit) 76 bpm 68 bpm 74 bpm 72 bpm 99 bpm   Rating of Perceived Exertion (Exercise) 13 11 11 12 11    Symptoms none none none none none   Comments pt tolerated first week of cardiac rehab very well; Pt was oriented to exercise equipment pt tolerated first week of cardiac rehab very well; Pt was oriented to exercise equipment  -  -  -   Duration Continue with 30 min of aerobic exercise without signs/symptoms of physical distress. Continue with 30 min of aerobic exercise without signs/symptoms of physical distress. Continue with 30 min of aerobic exercise without signs/symptoms of physical distress. Continue with 30 min of aerobic exercise without signs/symptoms of physical distress. Continue with 30 min of aerobic exercise without signs/symptoms of physical distress.   Intensity THRR unchanged  THRR unchanged THRR unchanged THRR unchanged THRR unchanged     Progression   Progression Continue to progress workloads to maintain intensity without signs/symptoms of physical distress. Continue  to progress workloads to maintain intensity without signs/symptoms of physical distress. Continue to progress workloads to maintain intensity without signs/symptoms of physical distress. Continue to progress workloads to maintain intensity without signs/symptoms of physical distress. Continue to progress workloads to maintain intensity without signs/symptoms of physical distress.   Average METs 2.5 3.1 3.3 3.8 4     Resistance Training   Training Prescription Yes Yes Yes Yes Yes   Weight 2lbs 2lbs 3lbs 3lbs 3lbs   Reps 10-15 10-15 10-15 10-15 10-15   Time 10 Minutes 10 Minutes 10 Minutes 10 Minutes 10 Minutes     Bike   Level 0.5 0.5 0.5 0.5 0.5   Minutes 10 10 10 10 10    METs 2.61 2.61 2.61 2.61 2.61     NuStep   Level 2 4 4 4 4    SPM 70 80 80 90 90   Minutes 10 10 10 10 10    METs 2 3.3 3.8 4.4 4.5     Rower   Level  -  - 1 1 1    Watts  -  - 11 37 43   METs  -  - 3.8 4.5 5     Track   Laps 10 13  -  -  -   Minutes 10 10  -  -  -   METs 2.74 3.26  -  -  -     Home Exercise Plan   Plans to continue exercise at  - Home (comment)  elliptical- starting at 5 min. multiple times a day Home (comment)  elliptical- starting at 5 min. multiple times a day Home (comment)  elliptical 5', multiple times a day Home (comment)  elliptical- starting at 5 min. multiple times a day   Frequency  - Add 3 additional days to program exercise sessions. Add 3 additional days to program exercise sessions. Add 3 additional days to program exercise sessions. Add 3 additional days to program exercise sessions.   Initial Home Exercises Provided  - 10/10/16 10/10/16 10/10/16 10/10/16      Exercise Comments:     Exercise Comments    Row Name 10/15/16 1102 11/10/16 1058         Exercise Comments Reviewed  METs and goals. Pt is tolerating exercise well; will continue to monitor pt's activity levels/ exercise tolerance Reviewed METs and goals. Pt is tolerating exercise well; will continue to monitor pt's activity levels/ exercise tolerance         Exercise Goals and Review:     Exercise Goals    Row Name 09/16/16 1239             Exercise Goals   Increase Physical Activity Yes  Learn activity and exercise limitations       Intervention Provide advice, education, support and counseling about physical activity/exercise needs.;Develop an individualized exercise prescription for aerobic and resistive training based on initial evaluation findings, risk stratification, comorbidities and participant's personal goals.       Expected Outcomes Achievement of increased cardiorespiratory fitness and enhanced flexibility, muscular endurance and strength shown through measurements of functional capacity and personal statement of participant.       Increase Strength and Stamina Yes  Build confidence with exercise and activity       Intervention Provide advice, education, support and counseling about physical activity/exercise needs.;Develop an individualized exercise prescription for aerobic and resistive training based on initial evaluation findings, risk stratification, comorbidities and participant's personal goals.  Expected Outcomes Achievement of increased cardiorespiratory fitness and enhanced flexibility, muscular endurance and strength shown through measurements of functional capacity and personal statement of participant.          Exercise Goals Re-Evaluation :     Exercise Goals Re-Evaluation    Row Name 10/10/16 1040 10/29/16 1045 12/05/16 0820         Exercise Goal Re-Evaluation   Exercise Goals Review Increase Physical Activity;Increase Strenth and Stamina Increase Physical Activity;Increase Strenth and Stamina Increase Physical Activity;Increase Strenth and Stamina     Comments  Reviewed home exercise with pt today.  Pt plans to use elliptical for exercise.  Suggested starting at 5 minutes 3x/day. Discussed progressing time to build on aerobic capacity. Goal is 10 min. 3x/day for a cumulative of 30 minutes total. Reviewed THR, pulse, RPE, sign and symptoms, and when to call 911 or MD.  Also discussed weather considerations and indoor options.  Pt voiced understanding. pt is using elliptical at home following exercise prescription without difficulty. pt is enjoying using rower at CR.  pt feels activity is getting easier and more comfortable for her.  Pt is making great progress in cardiac rehab and notice an increase in UE/core strength,mobility and overall fitness levels.     Expected Outcomes Pt will be compliant with HEP and improve in aerobic fitness level Pt will be compliant with HEP and improve in aerobic fitness level Pt will be compliant with HEP and improve in aerobic fitness level         Discharge Exercise Prescription (Final Exercise Prescription Changes):     Exercise Prescription Changes - 12/05/16 0800      Response to Exercise   Blood Pressure (Admit) 112/74   Blood Pressure (Exercise) 108/70   Blood Pressure (Exit) 122/78   Heart Rate (Admit) 65 bpm   Heart Rate (Exercise) 141 bpm   Heart Rate (Exit) 99 bpm   Rating of Perceived Exertion (Exercise) 11   Symptoms none   Duration Continue with 30 min of aerobic exercise without signs/symptoms of physical distress.   Intensity THRR unchanged     Progression   Progression Continue to progress workloads to maintain intensity without signs/symptoms of physical distress.   Average METs 4     Resistance Training   Training Prescription Yes   Weight 3lbs   Reps 10-15   Time 10 Minutes     Bike   Level 0.5   Minutes 10   METs 2.61     NuStep   Level 4   SPM 90   Minutes 10   METs 4.5     Rower   Level 1   Watts 43   METs 5     Home Exercise Plan   Plans to continue exercise at Home  (comment)  elliptical- starting at 5 min. multiple times a day   Frequency Add 3 additional days to program exercise sessions.   Initial Home Exercises Provided 10/10/16      Nutrition:  Target Goals: Understanding of nutrition guidelines, daily intake of sodium 1500mg , cholesterol 200mg , calories 30% from fat and 7% or less from saturated fats, daily to have 5 or more servings of fruits and vegetables.  Biometrics:     Pre Biometrics - 09/16/16 1237      Pre Biometrics   Waist Circumference 30 inches   Hip Circumference 36 inches   Waist to Hip Ratio 0.83 %   Triceps Skinfold 32 mm   % Body Fat 36.7 %  Grip Strength 29 kg   Flexibility 7.5 in   Single Leg Stand 30 seconds       Nutrition Therapy Plan and Nutrition Goals:     Nutrition Therapy & Goals - 10/08/16 1045      Nutrition Therapy   Diet Carb Modified, Therapeutic Lifestyle Changes      Personal Nutrition Goals   Nutrition Goal Pt to have a basic understanding of a diabetic diet.      Intervention Plan   Intervention Prescribe, educate and counsel regarding individualized specific dietary modifications aiming towards targeted core components such as weight, hypertension, lipid management, diabetes, heart failure and other comorbidities.;Nutrition handout(s) given to patient.  Pre-diabetes   Expected Outcomes Short Term Goal: Understand basic principles of dietary content, such as calories, fat, sodium, cholesterol and nutrients.;Long Term Goal: Adherence to prescribed nutrition plan.      Nutrition Discharge: Nutrition Scores:     Nutrition Assessments - 10/08/16 1045      MEDFICTS Scores   Pre Score 15      Nutrition Goals Re-Evaluation:   Nutrition Goals Re-Evaluation:   Nutrition Goals Discharge (Final Nutrition Goals Re-Evaluation):   Psychosocial: Target Goals: Acknowledge presence or absence of significant depression and/or stress, maximize coping skills, provide positive support  system. Participant is able to verbalize types and ability to use techniques and skills needed for reducing stress and depression.  Initial Review & Psychosocial Screening:     Initial Psych Review & Screening - 09/16/16 1600      Initial Review   Current issues with None Identified     Family Dynamics   Good Support System? Yes  Husband    Comments upon brief assessment, no psychosocial needs identified, no interventions necessary      Barriers   Psychosocial barriers to participate in program There are no identifiable barriers or psychosocial needs.     Screening Interventions   Interventions Encouraged to exercise;Provide feedback about the scores to participant      Quality of Life Scores:     Quality of Life - 09/26/16 1057      Quality of Life Scores   Health/Function Pre 27.9 %  QOL reviewed with pt.  DOE and fatigue are primary concerns.  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     Family Pre --  pt has positive family relations and enjoys spending alone time reading, photography hobby and watching documentaries.     GLOBAL Pre --  overall pt discplaysgood coping skills with positive hopeful outlook and attitude.       PHQ-9: Recent Review Flowsheet Data    Depression screen [facility] 2/9 09/26/2016   Decreased Interest 0   Down, Depressed, Hopeless 0   PHQ - 2 Score 0     Interpretation of Total Score  Total Score Depression Severity:  1-4 = Minimal depression, 5-9 = Mild depression, 10-14 = Moderate depression, 15-19 = Moderately severe depression, 20-27 = Severe depression   Psychosocial Evaluation and Intervention:     Psychosocial Evaluation - 09/26/16 1044      Psychosocial Evaluation & Interventions   Interventions Encouraged to exercise with the program and follow exercise prescription;Stress management  education   Comments health related anxiety.  pt is followed by personal therapist for prior situational stress.  pt overall displays positive outlook with good coping  skills. pt feels confident in her ability to recover emotionally from her recent cardiac event.     Expected Outcomes pt will display positive outlood wtih good coping skkills.    Continue Psychosocial Services  Follow up required by staff      Psychosocial Re-Evaluation:     Psychosocial Re-Evaluation    [place] Name 10/29/16 1047 11/14/16 1047 12/09/16 1700         Psychosocial Re-Evaluation   Current issues with Current Anxiety/Panic Current Stress Concerns;Current Anxiety/Panic  -     Comments health related anxiety is improving. pt reports feeling more comfortable and cofident with her CR activities.  pt consistently attending CR exercise and education classes.  pt tearful at rehab.  pt overwhelmed with situational stress.  pt encouraged to make appointment with therapist to discuss.  pt offered appt with Jeanella Craze.  pt declined due to having personal therapist. pt also encouraged to participate in self care stress management techniques. pt offered emotioanl support and reassurance.  pt demonstrates improved outlook and mood.  pt situational stress has resolved.  pt encouraged to participate in self care stress managemnt techniques. pt offered emotional support and reassurance.      Expected Outcomes pt will demonstrate positive outlook with good coping skills. pt will demonstrate positive outlook with good coping skills.  -     Interventions Stress management education;Encouraged to attend Cardiac Rehabilitation for the exercise;Relaxation education Stress management education;Encouraged to attend Cardiac Rehabilitation for the exercise;Relaxation education Stress management education;Encouraged to attend Cardiac Rehabilitation for the exercise;Relaxation education     Continue Psychosocial Services  Follow up required by  staff Follow up required by staff Follow up required by staff        Psychosocial Discharge (Final Psychosocial Re-Evaluation):     Psychosocial Re-Evaluation - 12/09/16 1700      Psychosocial Re-Evaluation   Comments pt demonstrates improved outlook and mood.  pt situational stress has resolved.  pt encouraged to participate in self care stress managemnt techniques. pt offered emotional support and reassurance.    Interventions Stress management education;Encouraged to attend Cardiac Rehabilitation for the exercise;Relaxation education   Continue Psychosocial Services  Follow up required by staff      Vocational Rehabilitation: Provide vocational rehab assistance to qualifying candidates.   Vocational Rehab Evaluation & Intervention:     Vocational Rehab - 09/16/16 1558      Initial Vocational Rehab Evaluation & Intervention   Assessment shows need for Vocational Rehabilitation --  pt did not indicate       Education: Education Goals: Education classes will be provided on a weekly basis, covering required topics. Participant will state understanding/return demonstration of topics presented.  Learning Barriers/Preferences:     Learning Barriers/Preferences - 09/16/16 1004      Learning Barriers/Preferences   Learning Preferences Written Material;Skilled Demonstration;Pictoral      Education Topics: Count Your Pulse:  -Group instruction provided by verbal instruction, demonstration, patient participation and written materials to support subject.  Instructors address importance of being able to find your pulse and how to count your pulse when at home without a heart monitor.  Patients get hands on experience counting their pulse with staff help and individually.   CARDIAC REHAB PHASE II EXERCISE from 12/05/2016 in [place]  Date  11/28/16  Educator  RN  Instruction Review Code  R- Review/reinforce      Heart Attack, Angina, and Risk  Factor Modification:  -Group instruction provided by  verbal instruction, video, and written materials to support subject.  Instructors address signs and symptoms of angina and heart attacks.    Also discuss risk factors for heart disease and how to make changes to improve heart health risk factors.   CARDIAC REHAB PHASE II EXERCISE from 12/05/2016 in [place]  Date  10/15/16  Instruction Review Code  2- meets goals/outcomes      Functional Fitness:  -Group instruction provided by verbal instruction, demonstration, patient participation, and written materials to support subject.  Instructors address safety measures for doing things around the house.  Discuss how to get up and down off the floor, how to pick things up properly, how to safely get out of a chair without assistance, and balance training.   CARDIAC REHAB PHASE II EXERCISE from 12/05/2016 in [place]  Date  11/07/16  Instruction Review Code  2- meets goals/outcomes      Meditation and Mindfulness:  -Group instruction provided by verbal instruction, patient participation, and written materials to support subject.  Instructor addresses importance of mindfulness and meditation practice to help reduce stress and improve awareness.  Instructor also leads participants through a meditation exercise.    CARDIAC REHAB PHASE II EXERCISE from 12/05/2016 in [place]  Date  10/29/16  Instruction Review Code  2- meets goals/outcomes      Stretching for Flexibility and Mobility:  -Group instruction provided by verbal instruction, patient participation, and written materials to support subject.  Instructors lead participants through series of stretches that are designed to increase flexibility thus improving mobility.  These stretches are additional exercise for major muscle groups that are typically performed during regular warm up and cool down.    CARDIAC REHAB PHASE II EXERCISE from 12/05/2016 in [place]  Date  11/14/16  Instruction Review Code  2- meets goals/outcomes      Hands Only CPR:  -Group verbal, video, and participation provides a basic overview of AHA guidelines for community CPR. Role-play of emergencies allow participants the opportunity to practice calling for help and chest compression technique with discussion of AED use.   Hypertension: -Group verbal and written instruction that provides a basic overview of hypertension including the most recent diagnostic guidelines, risk factor reduction with self-care instructions and medication management.   CARDIAC REHAB PHASE II EXERCISE from 12/05/2016 in [place]  Date  10/31/16  Instruction Review Code  2- meets goals/outcomes       Nutrition I class: Heart Healthy Eating:  -Group instruction provided by PowerPoint slides, verbal discussion, and written materials to support subject matter. The instructor gives an explanation and review of the Therapeutic Lifestyle Changes diet recommendations, which includes a discussion on lipid goals, dietary fat, sodium, fiber, plant stanol/sterol esters, sugar, and the components of a well-balanced, healthy diet.   CARDIAC REHAB PHASE II EXERCISE from 12/05/2016 in [place]  Date  10/08/16  Educator  RD  Instruction Review Code  Not applicable [class handouts given]      Nutrition II class: Lifestyle Skills:  -Group instruction provided by PowerPoint slides, verbal discussion, and written materials to support subject matter. The instructor gives an explanation and review of label reading, grocery shopping for heart health, heart healthy recipe modifications, and ways to make healthier choices when eating out.   CARDIAC REHAB PHASE II EXERCISE from 12/05/2016 in [facility]  CARDIAC REHAB  Date  10/08/16  Educator  RD   Instruction Review Code  Not applicable [class handouts given]      Diabetes Question & Answer:  -Group instruction provided by PowerPoint slides, verbal discussion, and written materials to support subject matter. The instructor gives an explanation and review of diabetes co-morbidities, pre- and post-prandial blood glucose goals, pre-exercise blood glucose goals, signs, symptoms, and treatment of hypoglycemia and hyperglycemia, and foot care basics.   CARDIAC REHAB PHASE II EXERCISE from 12/05/2016 in [place]  Date  12/05/16  Educator  RD  Instruction Review Code  2- meets goals/outcomes      Diabetes Blitz:  -Group instruction provided by PowerPoint slides, verbal discussion, and written materials to support subject matter. The instructor gives an explanation and review of the physiology behind type 1 and type 2 diabetes, diabetes medications and rational behind using different medications, pre- and post-prandial blood glucose recommendations and Hemoglobin A1c goals, diabetes diet, and exercise including blood glucose guidelines for exercising safely.    Portion Distortion:  -Group instruction provided by PowerPoint slides, verbal discussion, written materials, and food models to support subject matter. The instructor gives an explanation of serving size versus portion size, changes in portions sizes over the last 20 years, and what consists of a serving from each food group.   CARDIAC REHAB PHASE II EXERCISE from 12/05/2016 in [place]  Date  11/12/16  Educator  RD  Instruction Review Code  2- meets goals/outcomes      Stress Management:  -Group instruction provided by verbal instruction, video, and written materials to support subject matter.  Instructors review role of stress in heart disease and how to cope with stress positively.     CARDIAC REHAB PHASE II EXERCISE from 12/05/2016 in [place]  Date  11/19/16  Instruction Review Code  2- meets goals/outcomes      Exercising on Your Own:  -Group instruction provided by verbal instruction, power point, and written materials to support subject.  Instructors discuss benefits of exercise, components of exercise, frequency and intensity of exercise, and end points for exercise.  Also discuss use of nitroglycerin and activating EMS.  Review options of places to exercise outside of rehab.  Review guidelines for sex with heart disease.   CARDIAC REHAB PHASE II EXERCISE from 12/05/2016 in [place]  Date  10/08/16  Instruction Review Code  2- meets goals/outcomes      Cardiac Drugs I:  -Group instruction provided by verbal instruction and written materials to support subject.  Instructor reviews cardiac drug classes: antiplatelets, anticoagulants, beta blockers, and statins.  Instructor discusses reasons, side effects, and lifestyle considerations for each drug class.   Cardiac Drugs II:  -Group instruction provided by verbal instruction and written materials to support subject.  Instructor reviews cardiac drug classes: angiotensin converting enzyme inhibitors (ACE-I), angiotensin II receptor blockers (ARBs), nitrates, and calcium channel blockers.  Instructor discusses reasons, side effects, and lifestyle considerations for each drug class.   CARDIAC REHAB PHASE II EXERCISE from 12/05/2016 in [place]  Date  12/03/16  Educator  Kennyth Lose  Instruction Review Code  2- meets goals/outcomes      Anatomy and Physiology of the Circulatory System:  Group verbal and written instruction and models provide basic cardiac anatomy and physiology, with the coronary electrical and arterial systems. Review of: AMI, Angina, Valve disease,  Heart Failure, Peripheral Artery Disease, Cardiac Arrhythmia, Pacemakers, and the ICD.   CARDIAC REHAB PHASE II EXERCISE from 12/05/2016 in  [place]  Date  10/22/16  Instruction Review Code  2- meets goals/outcomes      Other Education:  -Group or individual verbal, written, or video instructions that support the educational goals of the cardiac rehab program.   Knowledge Questionnaire Score:     Knowledge Questionnaire Score - 09/16/16 1231      Knowledge Questionnaire Score   Pre Score 15/24      Core Components/Risk Factors/Patient Goals at Admission:     Personal Goals and Risk Factors at Admission - 09/16/16 1238      Core Components/Risk Factors/Patient Goals on Admission    Weight Management Weight Maintenance;Weight Loss;Yes   Intervention Weight Management: Develop a combined nutrition and exercise program designed to reach desired caloric intake, while maintaining appropriate intake of nutrient and fiber, sodium and fats, and appropriate energy expenditure required for the weight goal.;Weight Management: Provide education and appropriate resources to help participant work on and attain dietary goals.;Weight Management/Obesity: Establish reasonable short term and long term weight goals.   Expected Outcomes Short Term: Continue to assess and modify interventions until short term weight is achieved;Long Term: Adherence to nutrition and physical activity/exercise program aimed toward attainment of established weight goal;Weight Maintenance: Understanding of the daily nutrition guidelines, which includes 25-35% calories from fat, 7% or less cal from saturated fats, less than 200mg  cholesterol, less than 1.5gm of sodium, & 5 or more servings of fruits and vegetables daily;Weight Loss: Understanding of general recommendations for a balanced deficit meal plan, which promotes 1-2 lb weight loss per week and includes a negative energy balance of [phone] kcal/d;Understanding recommendations for meals to include 15-35% energy as protein, 25-35% energy from fat, 35-60% energy from  carbohydrates, less than 200mg  of dietary cholesterol, 20-35 gm of total fiber daily;Understanding of distribution of calorie intake throughout the day with the consumption of 4-5 meals/snacks   Lipids Yes   Intervention Provide education and support for participant on nutrition & aerobic/resistive exercise along with prescribed medications to achieve LDL 70mg , HDL >40mg .   Expected Outcomes Short Term: Participant states understanding of desired cholesterol values and is compliant with medications prescribed. Participant is following exercise prescription and nutrition guidelines.;Long Term: Cholesterol controlled with medications as prescribed, with individualized exercise RX and with personalized nutrition plan. Value goals: LDL < 70mg , HDL > 40 mg.      Core Components/Risk Factors/Patient Goals Review:      Goals and Risk Factor Review    Row Name 10/15/16 1617 10/29/16 1047 12/08/16 1201         Core Components/Risk Factors/Patient Goals Review   Personal Goals Review Weight Management/Obesity;Lipids Weight Management/Obesity;Lipids Weight Management/Obesity;Lipids     Review pt feels positive about her current progression in cardiac rehab.  pt is actively working towards weight loss and exertion goals.  pt reports her fatigue is improving .  pt interested in changing exercise routine to include alternate equipment such as rower/eliptical.   pt feels positive about her current progression in cardiac rehab.  pt is actively working towards weight loss and exertion goals.     pt feels positive about her current progression in cardiac rehab.  pt is actively working towards weight loss and exertion goals.         Expected Outcomes pt will participate in CR exercise, nutrition and lifestyle education opportunities to decrease overall  CAD risk factors.  pt will participate in CR exercise, nutrition and lifestyle education opportunities to decrease overall CAD risk factors.  pt will participate in  CR exercise, nutrition and lifestyle education opportunities to decrease overall CAD risk factors.         Core Components/Risk Factors/Patient Goals at Discharge (Final Review):      Goals and Risk Factor Review - 12/08/16 1201      Core Components/Risk Factors/Patient Goals Review   Personal Goals Review Weight Management/Obesity;Lipids   Review pt feels positive about her current progression in cardiac rehab.  pt is actively working towards weight loss and exertion goals.       Expected Outcomes pt will participate in CR exercise, nutrition and lifestyle education opportunities to decrease overall CAD risk factors.       ITP Comments:     ITP Comments    Row Name 09/16/16 4401 11/11/16 1601 12/09/16 1659       ITP Comments Medical Director, Dr. Fransico Him Medical Director, Dr. Fransico Him Medical Director, Dr. Fransico Him        Comments: Pt is making expected progress toward personal goals after completing 20 sessions. Recommend continued exercise and life style modification education including  stress management and relaxation techniques to decrease cardiac risk profile.

## 2016-12-10 ENCOUNTER — Encounter (HOSPITAL_COMMUNITY)
Admission: RE | Admit: 2016-12-10 | Discharge: 2016-12-10 | Disposition: A | Discharge status: Home / Self Care | Payer: Federal, State, Local not specified - PPO | Source: Ambulatory Visit | Attending: Cardiovascular Disease | Admitting: Cardiovascular Disease

## 2016-12-10 DIAGNOSIS — Z951 Presence of aortocoronary bypass graft: Secondary | ICD-10-CM | POA: Diagnosis not present

## 2016-12-10 DIAGNOSIS — I214 Non-ST elevation (NSTEMI) myocardial infarction: Secondary | ICD-10-CM | POA: Diagnosis not present

## 2016-12-10 DIAGNOSIS — F419 Anxiety disorder, unspecified: Secondary | ICD-10-CM | POA: Diagnosis not present

## 2016-12-10 DIAGNOSIS — I2510 Atherosclerotic heart disease of native coronary artery without angina pectoris: Secondary | ICD-10-CM | POA: Diagnosis not present

## 2016-12-12 ENCOUNTER — Encounter (HOSPITAL_COMMUNITY)
Admission: RE | Admit: 2016-12-12 | Discharge: 2016-12-12 | Disposition: A | Discharge status: Home / Self Care | Payer: Federal, State, Local not specified - PPO | Source: Ambulatory Visit | Attending: Cardiovascular Disease | Admitting: Cardiovascular Disease

## 2016-12-12 DIAGNOSIS — Z951 Presence of aortocoronary bypass graft: Secondary | ICD-10-CM | POA: Diagnosis not present

## 2016-12-12 DIAGNOSIS — F419 Anxiety disorder, unspecified: Secondary | ICD-10-CM | POA: Diagnosis not present

## 2016-12-12 DIAGNOSIS — I214 Non-ST elevation (NSTEMI) myocardial infarction: Secondary | ICD-10-CM | POA: Diagnosis not present

## 2016-12-12 DIAGNOSIS — I2510 Atherosclerotic heart disease of native coronary artery without angina pectoris: Secondary | ICD-10-CM | POA: Diagnosis not present

## 2016-12-12 DIAGNOSIS — E7800 Pure hypercholesterolemia, unspecified: Secondary | ICD-10-CM | POA: Diagnosis not present

## 2016-12-12 LAB — LIPID PANEL
CHOL/HDL RATIO: 7 ratio — AB (ref <5.0)
Cholesterol: 246 mg/dL — ABNORMAL HIGH (ref <200)
HDL: 35 mg/dL — AB (ref >50)
LDL Cholesterol: 164 mg/dL — ABNORMAL HIGH (ref <100)
Triglycerides: 237 mg/dL — ABNORMAL HIGH (ref <150)
VLDL: 47 mg/dL — ABNORMAL HIGH (ref <30)

## 2016-12-12 LAB — HEPATIC FUNCTION PANEL
ALBUMIN: 4.2 g/dL (ref 3.6–5.1)
ALT: 27 U/L (ref 6–29)
AST: 20 U/L (ref 10–35)
Alkaline Phosphatase: 66 U/L (ref 33–115)
BILIRUBIN TOTAL: 0.5 mg/dL (ref 0.2–1.2)
Bilirubin, Direct: 0.1 mg/dL (ref <=0.2)
Indirect Bilirubin: 0.4 mg/dL (ref 0.2–1.2)
Total Protein: 6.7 g/dL (ref 6.1–8.1)

## 2016-12-17 ENCOUNTER — Encounter (HOSPITAL_COMMUNITY): Payer: Federal, State, Local not specified - PPO

## 2016-12-18 ENCOUNTER — Telehealth: Payer: Self-pay | Admitting: Cardiovascular Disease

## 2016-12-18 NOTE — Telephone Encounter (Signed)
Closed encounter °

## 2016-12-19 ENCOUNTER — Encounter (HOSPITAL_COMMUNITY)
Admission: RE | Admit: 2016-12-19 | Discharge: 2016-12-19 | Disposition: A | Discharge status: Home / Self Care | Payer: Federal, State, Local not specified - PPO | Source: Ambulatory Visit | Attending: Cardiovascular Disease | Admitting: Cardiovascular Disease

## 2016-12-19 DIAGNOSIS — F419 Anxiety disorder, unspecified: Secondary | ICD-10-CM | POA: Diagnosis not present

## 2016-12-19 DIAGNOSIS — I214 Non-ST elevation (NSTEMI) myocardial infarction: Secondary | ICD-10-CM | POA: Diagnosis not present

## 2016-12-19 DIAGNOSIS — I2510 Atherosclerotic heart disease of native coronary artery without angina pectoris: Secondary | ICD-10-CM | POA: Diagnosis not present

## 2016-12-19 DIAGNOSIS — Z951 Presence of aortocoronary bypass graft: Secondary | ICD-10-CM | POA: Diagnosis not present

## 2016-12-24 ENCOUNTER — Encounter (HOSPITAL_COMMUNITY)
Admission: RE | Admit: 2016-12-24 | Discharge: 2016-12-24 | Disposition: A | Discharge status: Home / Self Care | Payer: Federal, State, Local not specified - PPO | Source: Ambulatory Visit | Attending: Cardiovascular Disease | Admitting: Cardiovascular Disease

## 2016-12-24 DIAGNOSIS — Z951 Presence of aortocoronary bypass graft: Secondary | ICD-10-CM | POA: Insufficient documentation

## 2016-12-24 DIAGNOSIS — F419 Anxiety disorder, unspecified: Secondary | ICD-10-CM | POA: Diagnosis not present

## 2016-12-24 DIAGNOSIS — I214 Non-ST elevation (NSTEMI) myocardial infarction: Secondary | ICD-10-CM | POA: Insufficient documentation

## 2016-12-24 DIAGNOSIS — I2510 Atherosclerotic heart disease of native coronary artery without angina pectoris: Secondary | ICD-10-CM | POA: Diagnosis not present

## 2016-12-26 ENCOUNTER — Ambulatory Visit (INDEPENDENT_AMBULATORY_CARE_PROVIDER_SITE_OTHER): Payer: Federal, State, Local not specified - PPO | Admitting: Pharmacist Clinician (PhC)/ Clinical Pharmacy Specialist

## 2016-12-26 ENCOUNTER — Encounter (HOSPITAL_COMMUNITY)
Admission: RE | Admit: 2016-12-26 | Discharge: 2016-12-26 | Disposition: A | Discharge status: Home / Self Care | Payer: Federal, State, Local not specified - PPO | Source: Ambulatory Visit | Attending: Cardiovascular Disease | Admitting: Cardiovascular Disease

## 2016-12-26 DIAGNOSIS — Z951 Presence of aortocoronary bypass graft: Secondary | ICD-10-CM | POA: Diagnosis not present

## 2016-12-26 DIAGNOSIS — I214 Non-ST elevation (NSTEMI) myocardial infarction: Secondary | ICD-10-CM | POA: Diagnosis not present

## 2016-12-26 DIAGNOSIS — E7849 Other hyperlipidemia: Secondary | ICD-10-CM | POA: Insufficient documentation

## 2016-12-26 DIAGNOSIS — F419 Anxiety disorder, unspecified: Secondary | ICD-10-CM | POA: Diagnosis not present

## 2016-12-26 DIAGNOSIS — E785 Hyperlipidemia, unspecified: Secondary | ICD-10-CM | POA: Diagnosis not present

## 2016-12-26 DIAGNOSIS — I2510 Atherosclerotic heart disease of native coronary artery without angina pectoris: Secondary | ICD-10-CM | POA: Diagnosis not present

## 2016-12-26 MED ORDER — EVOLOCUMAB 140 MG/ML ~~LOC~~ SOAJ: 140.0000 mg | SUBCUTANEOUS | 12 refills | Status: DC

## 2016-12-26 NOTE — Progress Notes (Signed)
12/26/2016 [name] 05/15/1971 [id number]   HPI:  [name] is a 46 y.o. female patient of Dr [name], who presents today for a lipid clinic evaluation.  [name] has a cardiac history significant for NSTEMI in February of this year with CABG x 1 and hyperlipidemia.  She has had elevated cholesterol since her early 20's, but as she is adopted, there is no family history.  She is a patient of Dr. Myriam Jacobson at [facility], we will contact them for additional records regarding her statin use  Current Medications:  Atorvastatin 40 mg daily - has noted this does make her joints ache some, but not as much as the 80 mg dose she was started on after the MI.  Cholesterol Goals:  LDL < 70   Intolerant/previously tried:  Patient notes she tried several statins including rosuvastatin and simvastatin at varying doses, but they all caused her to have severe cramping in her legs.    Family history:   Unknown, pt adopted  Diet:   Eats mainly home cooked foods, occasional restaurant, but no fast food or deep fried.   Does admit to enjoying "white" foods, but has always tried to eat healthy because of her hyperlipidemia  Exercise:    Cardiac rehab three times weekly, also works full time in retail job, on her feet all day  Labs:   11/2016:   TC 246, TG 237, HDL 35, LDL 164  Current Outpatient Prescriptions  Medication Sig Dispense Refill  . aspirin EC 325 MG EC tablet Take 1 tablet (325 mg total) by mouth daily. 30 tablet 0  . atorvastatin (LIPITOR) 40 MG tablet Take 1 tablet (40 mg total) by mouth daily at 6 PM. 30 tablet 6  . baclofen (LIORESAL) 10 MG tablet Take 10 mg by mouth 2 (two) times daily as needed (headache). Limit to two days per week.    . Evolocumab (REPATHA SURECLICK) 510 MG/ML SOAJ Inject 140 mg into the skin every 14 (fourteen) days. 2 pen 12  . zonisamide (ZONEGRAN) 100 MG capsule Take 100 mg by mouth at bedtime.     No current facility-administered medications for  this visit.     Allergies  Allergen Reactions  . Bactrim Itching and Rash         Past Medical History:  Diagnosis Date  . Anemia    due to heavy periods  . Anxiety   . Coronary artery disease   . Difficult intubation 01/22/2016  . Headache   . Heart murmur    aschild antibiotics if having Dental work  . Hypocholesteremia    medical treatment  . Ischemic cardiomyopathy   . Menorrhagia   . NSTEMI (non-ST elevated myocardial infarction) ([place])   . Pelvic pain     Last menstrual period 08/10/2011.   Hyperlipidemia LDL goal <70 Patient with probable familial hyperlipidemia, LDL at 164 on atorvastatin 40 mg daily.  Will need to add Repatha 140 mg q14 d to help get LDL to cholesterol goal of < 70.  Because of her increased cramping with atorvastatin, will have her switch to rosuvastatin 10 mg three times weekly to help improve her quality of life.     Tommy Medal PharmD CPP [facility]

## 2016-12-26 NOTE — Assessment & Plan Note (Signed)
Patient with probable familial hyperlipidemia, LDL at 164 on atorvastatin 40 mg daily.  Will need to add Repatha 140 mg q14 d to help get LDL to cholesterol goal of < 70.  Because of her increased cramping with atorvastatin, will have her switch to rosuvastatin 10 mg three times weekly to help improve her quality of life.

## 2016-12-26 NOTE — Patient Instructions (Signed)
We will start the paperwork to get Repatha covered by your insurance company.  You should hear from our specialty pharmacy in 2-4 weeks ([facility])  If you have any questions about the medication you can call [phone] Erasmo Downer or Raquel)  Evolocumab injection What is this medicine? EVOLOCUMAB (e voe LOK ue mab) is known as a PCSK9 inhibitor. It is used to lower the level of cholesterol in the blood. It may be used alone or in combination with other cholesterol-lowering drugs. This drug may also be used to reduce the risk of heart attack, stroke, and certain types of heart surgery in patients with heart disease. This medicine may be used for other purposes; ask your health care provider or pharmacist if you have questions. COMMON BRAND NAME(S): REPATHA What should I tell my health care provider before I take this medicine? They need to know if you have any of these conditions: -an unusual or allergic reaction to evolocumab, other medicines, foods, dyes, or preservatives -pregnant or trying to get pregnant -breast-feeding How should I use this medicine? This medicine is for injection under the skin. You will be taught how to prepare and give this medicine. Use exactly as directed. Take your medicine at regular intervals. Do not take your medicine more often than directed. It is important that you put your used needles and syringes in a special sharps container. Do not put them in a trash can. If you do not have a sharps container, call your pharmacist or health care provider to get one. Talk to your pediatrician regarding the use of this medicine in children. While this drug may be prescribed for children as young as 13 years for selected conditions, precautions do apply. Overdosage: If you think you have taken too much of this medicine contact a poison control center or emergency room at once. NOTE: This medicine is only for you. Do not share this medicine with others. What if I miss a dose? If  you miss a dose, take it as soon as you can if there are more than 7 days until the next scheduled dose, or skip the missed dose and take the next dose according to your original schedule. Do not take double or extra doses. What may interact with this medicine? Interactions are not expected. This list may not describe all possible interactions. Give your health care provider a list of all the medicines, herbs, non-prescription drugs, or dietary supplements you use. Also tell them if you smoke, drink alcohol, or use illegal drugs. Some items may interact with your medicine. What should I watch for while using this medicine? You may need blood work while you are taking this medicine. What side effects may I notice from receiving this medicine? Side effects that you should report to your doctor or health care professional as soon as possible: -allergic reactions like skin rash, itching or hives, swelling of the face, lips, or tongue -signs and symptoms of infection like fever or chills; cough; sore throat; pain or trouble passing urine Side effects that usually do not require medical attention (report to your doctor or health care professional if they continue or are bothersome): -diarrhea -nausea -muscle pain -pain, redness, or irritation at site where injected This list may not describe all possible side effects. Call your doctor for medical advice about side effects. You may report side effects to FDA at 1-800-FDA-1088. Where should I keep my medicine? Keep out of the reach of children. You will be instructed on how to store  this medicine. Throw away any unused medicine after the expiration date on the label. NOTE: This sheet is a summary. It may not cover all possible information. If you have questions about this medicine, talk to your doctor, pharmacist, or health care provider.  2018 Elsevier/Gold Standard (2016-04-28 13:21:53)

## 2016-12-31 ENCOUNTER — Encounter (HOSPITAL_COMMUNITY): Payer: Federal, State, Local not specified - PPO

## 2017-01-02 ENCOUNTER — Encounter (HOSPITAL_COMMUNITY): Payer: Federal, State, Local not specified - PPO

## 2017-01-07 ENCOUNTER — Encounter (HOSPITAL_COMMUNITY)
Admission: RE | Admit: 2017-01-07 | Discharge: 2017-01-07 | Disposition: A | Discharge status: Home / Self Care | Payer: Federal, State, Local not specified - PPO | Source: Ambulatory Visit | Attending: Cardiovascular Disease | Admitting: Cardiovascular Disease

## 2017-01-07 DIAGNOSIS — F419 Anxiety disorder, unspecified: Secondary | ICD-10-CM | POA: Diagnosis not present

## 2017-01-07 DIAGNOSIS — I214 Non-ST elevation (NSTEMI) myocardial infarction: Secondary | ICD-10-CM

## 2017-01-07 DIAGNOSIS — I2510 Atherosclerotic heart disease of native coronary artery without angina pectoris: Secondary | ICD-10-CM | POA: Diagnosis not present

## 2017-01-07 DIAGNOSIS — Z951 Presence of aortocoronary bypass graft: Secondary | ICD-10-CM

## 2017-01-08 NOTE — Progress Notes (Signed)
Cardiac Individual Treatment Plan  Patient Details  Name: [name] MRN: [id number] Date of Birth: [birth year] Referring Provider:     CARDIAC REHAB PHASE II ORIENTATION from 09/16/2016 in [place]  Referring Provider  Jenkins Rouge MD      Initial Encounter Date:    CARDIAC REHAB PHASE II ORIENTATION from 09/16/2016 in [place]  Date  09/16/16  Referring Provider  Jenkins Rouge MD      Visit Diagnosis: 07/11/16 NSTEMI (non-ST elevated myocardial infarction) ([place])  07/12/16 S/P CABG x 1  Patient's Home Medications on Admission:  Current Outpatient Prescriptions:  .  aspirin EC 325 MG EC tablet, Take 1 tablet (325 mg total) by mouth daily., Disp: 30 tablet, Rfl: 0 .  atorvastatin (LIPITOR) 40 MG tablet, Take 1 tablet (40 mg total) by mouth daily at 6 PM., Disp: 30 tablet, Rfl: 6 .  baclofen (LIORESAL) 10 MG tablet, Take 10 mg by mouth 2 (two) times daily as needed (headache). Limit to two days per week., Disp: , Rfl:  .  Evolocumab (REPATHA SURECLICK) 400 MG/ML SOAJ, Inject 140 mg into the skin every 14 (fourteen) days., Disp: 2 pen, Rfl: 12 .  zonisamide (ZONEGRAN) 100 MG capsule, Take 100 mg by mouth at bedtime., Disp: , Rfl:   Past Medical History: Past Medical History:  Diagnosis Date  . Anemia    due to heavy periods  . Anxiety   . Coronary artery disease   . Difficult intubation 01/22/2016  . Headache   . Heart murmur    aschild antibiotics if having Dental work  . Hypocholesteremia    medical treatment  . Ischemic cardiomyopathy   . Menorrhagia   . NSTEMI (non-ST elevated myocardial infarction) ([place])   . Pelvic pain     Tobacco Use: History  Smoking Status  . Never Smoker  Smokeless Tobacco  . Never Used    Labs: Recent Review Flowsheet Data    Labs for ITP Cardiac and Pulmonary Rehab Latest Ref Rng & Units 07/12/2016 07/12/2016 07/12/2016 07/13/2016 12/12/2016   Cholestrol <200 mg/dL - - - -  246(H)   LDLCALC <100 mg/dL - - - - 164(H)   HDL >50 mg/dL - - - - 35(L)   Trlycerides <150 mg/dL - - - - 237(H)   Hemoglobin A1c 4.8 - 5.6 % - - - - -   PHART 7.350 - 7.450 7.311(L) 7.376 - - -   PCO2ART 32.0 - 48.0 mmHg 38.8 37.2 - - -   HCO3 20.0 - 28.0 mmol/L 19.4(L) 21.6 - - -   TCO2 0 - 100 mmol/L 21 23 22 20  -   ACIDBASEDEF 0.0 - 2.0 mmol/L 6.0(H) 3.0(H) - - -   O2SAT % 99.0 98.0 - - -      Capillary Blood Glucose: Lab Results  Component Value Date   GLUCAP 118 (H) 07/17/2016   GLUCAP 116 (H) 07/16/2016   GLUCAP 146 (H) 07/16/2016   GLUCAP 135 (H) 07/16/2016   GLUCAP 102 (H) 07/16/2016     Exercise Target Goals:    Exercise Program Goal: Individual exercise prescription set with THRR, safety & activity barriers. Participant demonstrates ability to understand and report RPE using BORG scale, to self-measure pulse accurately, and to acknowledge the importance of the exercise prescription.  Exercise Prescription Goal: Starting with aerobic activity 30 plus minutes a day, 3 days per week for initial exercise prescription. Provide home exercise prescription and guidelines that participant  acknowledges understanding prior to discharge.  Activity Barriers & Risk Stratification:     Activity Barriers & Cardiac Risk Stratification - 09/16/16 1001      Activity Barriers & Cardiac Risk Stratification   Activity Barriers None   Cardiac Risk Stratification High      6 Minute Walk:     6 Minute Walk    Row Name 09/16/16 1231         6 Minute Walk   Phase Initial     Distance 1003 feet     Walk Time 6 minutes     # of Rest Breaks 0     MPH 1.9     METS 3.39     RPE 9     VO2 Peak 11.88     Symptoms No     Resting HR 75 bpm     Resting BP 112/74     Max Ex. HR 104 bpm     Max Ex. BP 118/76     2 Minute Post BP 124/80        Oxygen Initial Assessment:   Oxygen Re-Evaluation:   Oxygen Discharge (Final Oxygen Re-Evaluation):   Initial Exercise  Prescription:     Initial Exercise Prescription - 09/16/16 1200      Date of Initial Exercise RX and Referring Provider   Date 09/16/16   Referring Provider Jenkins Rouge MD     Bike   Level 0.4   Minutes 10   METs 2.37     NuStep   Level 2   SPM 70   Minutes 10   METs 2     Track   Laps 8   Minutes 10   METs 2.39     Prescription Details   Frequency (times per week) 3   Duration Progress to 30 minutes of continuous aerobic without signs/symptoms of physical distress     Intensity   THRR 40-80% of Max Heartrate 69-138   Ratings of Perceived Exertion 11-13   Perceived Dyspnea 0-4     Progression   Progression Continue to progress workloads to maintain intensity without signs/symptoms of physical distress.     Resistance Training   Training Prescription Yes   Weight 2lbs   Reps 10-15      Perform Capillary Blood Glucose checks as needed.  Exercise Prescription Changes:     Exercise Prescription Changes    Row Name 09/29/16 1000 10/15/16 1100 10/28/16 1100 11/19/16 0817 12/05/16 0800     Response to Exercise   Blood Pressure (Admit) 118/80 118/70 120/80 122/70 112/74   Blood Pressure (Exercise) 138/80 112/80 124/80 118/62 108/70   Blood Pressure (Exit) 116/74 112/74 100/64 122/80 122/78   Heart Rate (Admit) 76 bpm 66 bpm 74 bpm 72 bpm 65 bpm   Heart Rate (Exercise) 115 bpm 131 bpm 137 bpm 139 bpm 141 bpm   Heart Rate (Exit) 76 bpm 68 bpm 74 bpm 72 bpm 99 bpm   Rating of Perceived Exertion (Exercise) 13 11 11 12 11    Symptoms none none none none none   Comments pt tolerated first week of cardiac rehab very well; Pt was oriented to exercise equipment pt tolerated first week of cardiac rehab very well; Pt was oriented to exercise equipment  -  -  -   Duration Continue with 30 min of aerobic exercise without signs/symptoms of physical distress. Continue with 30 min of aerobic exercise without signs/symptoms of physical distress. Continue with 30 min of aerobic  exercise without signs/symptoms of physical distress. Continue with 30 min of aerobic exercise without signs/symptoms of physical distress. Continue with 30 min of aerobic exercise without signs/symptoms of physical distress.   Intensity THRR unchanged THRR unchanged THRR unchanged THRR unchanged THRR unchanged     Progression   Progression Continue to progress workloads to maintain intensity without signs/symptoms of physical distress. Continue to progress workloads to maintain intensity without signs/symptoms of physical distress. Continue to progress workloads to maintain intensity without signs/symptoms of physical distress. Continue to progress workloads to maintain intensity without signs/symptoms of physical distress. Continue to progress workloads to maintain intensity without signs/symptoms of physical distress.   Average METs 2.5 3.1 3.3 3.8 4     Resistance Training   Training Prescription Yes Yes Yes Yes Yes   Weight 2lbs 2lbs 3lbs 3lbs 3lbs   Reps 10-15 10-15 10-15 10-15 10-15   Time 10 Minutes 10 Minutes 10 Minutes 10 Minutes 10 Minutes     Bike   Level 0.5 0.5 0.5 0.5 0.5   Minutes 10 10 10 10 10    METs 2.61 2.61 2.61 2.61 2.61     NuStep   Level 2 4 4 4 4    SPM 70 80 80 90 90   Minutes 10 10 10 10 10    METs 2 3.3 3.8 4.4 4.5     Rower   Level  -  - 1 1 1    Watts  -  - 11 37 43   METs  -  - 3.8 4.5 5     Track   Laps 10 13  -  -  -   Minutes 10 10  -  -  -   METs 2.74 3.26  -  -  -     Home Exercise Plan   Plans to continue exercise at  - Home (comment)  elliptical- starting at 5 min. multiple times a day Home (comment)  elliptical- starting at 5 min. multiple times a day Home (comment)  elliptical 5', multiple times a day Home (comment)  elliptical- starting at 5 min. multiple times a day   Frequency  - Add 3 additional days to program exercise sessions. Add 3 additional days to program exercise sessions. Add 3 additional days to program exercise sessions. Add  3 additional days to program exercise sessions.   Initial Home Exercises Provided  - 10/10/16 10/10/16 10/10/16 10/10/16   Row Name 12/19/16 1435 01/06/17 1400           Response to Exercise   Blood Pressure (Admit) 118/68 104/62      Blood Pressure (Exercise) 140/80 130/70      Blood Pressure (Exit) 108/70 100/70      Heart Rate (Admit) 76 bpm 65 bpm      Heart Rate (Exercise) 141 bpm 139 bpm      Heart Rate (Exit) 82 bpm 75 bpm      Rating of Perceived Exertion (Exercise) 13 13      Symptoms none none      Duration Continue with 30 min of aerobic exercise without signs/symptoms of physical distress. Continue with 30 min of aerobic exercise without signs/symptoms of physical distress.      Intensity THRR unchanged THRR unchanged        Progression   Progression Continue to progress workloads to maintain intensity without signs/symptoms of physical distress. Continue to progress workloads to maintain intensity without signs/symptoms of physical distress.      Average METs  5 4.9        Resistance Training   Training Prescription Yes Yes      Weight 3lbs 3lbs      Reps 10-15 10-15      Time 10 Minutes 10 Minutes        Bike   Level 1 1      Minutes 10 10      METs 4.25 4.22        NuStep   Level 4 4      SPM 100 90      Minutes 10 10      METs 4.9 4.6        Rower   Level 1 3      Watts 51 48      METs 6 6        Home Exercise Plan   Plans to continue exercise at Home (comment)  elliptical 5' multiple times a day Home (comment)  elliptical- starting at 5 min. multiple times a day      Frequency Add 3 additional days to program exercise sessions. Add 3 additional days to program exercise sessions.      Initial Home Exercises Provided 10/10/16 10/10/16         Exercise Comments:     Exercise Comments    Row Name 10/15/16 1102 11/10/16 1058 01/06/17 1434       Exercise Comments Reviewed METs and goals. Pt is tolerating exercise well; will continue to monitor  pt's activity levels/ exercise tolerance Reviewed METs and goals. Pt is tolerating exercise well; will continue to monitor pt's activity levels/ exercise tolerance Reviewed METs and goals. Pt is tolerating exercise well; will continue to monitor pt's activity levels/ exercise tolerance        Exercise Goals and Review:     Exercise Goals    Row Name 09/16/16 1239             Exercise Goals   Increase Physical Activity Yes  Learn activity and exercise limitations       Intervention Provide advice, education, support and counseling about physical activity/exercise needs.;Develop an individualized exercise prescription for aerobic and resistive training based on initial evaluation findings, risk stratification, comorbidities and participant's personal goals.       Expected Outcomes Achievement of increased cardiorespiratory fitness and enhanced flexibility, muscular endurance and strength shown through measurements of functional capacity and personal statement of participant.       Increase Strength and Stamina Yes  Build confidence with exercise and activity       Intervention Provide advice, education, support and counseling about physical activity/exercise needs.;Develop an individualized exercise prescription for aerobic and resistive training based on initial evaluation findings, risk stratification, comorbidities and participant's personal goals.       Expected Outcomes Achievement of increased cardiorespiratory fitness and enhanced flexibility, muscular endurance and strength shown through measurements of functional capacity and personal statement of participant.          Exercise Goals Re-Evaluation :     Exercise Goals Re-Evaluation    Row Name 10/10/16 1040 10/29/16 1045 12/05/16 0820 01/06/17 1434       Exercise Goal Re-Evaluation   Exercise Goals Review Increase Physical Activity;Increase Strenth and Stamina Increase Physical Activity;Increase Strenth and Stamina Increase  Physical Activity;Increase Strenth and Stamina  -    Comments Reviewed home exercise with pt today.  Pt plans to use elliptical for exercise.  Suggested starting at 5 minutes 3x/day. Discussed progressing  time to build on aerobic capacity. Goal is 10 min. 3x/day for a cumulative of 30 minutes total. Reviewed THR, pulse, RPE, sign and symptoms, and when to call 911 or MD.  Also discussed weather considerations and indoor options.  Pt voiced understanding. pt is using elliptical at home following exercise prescription without difficulty. pt is enjoying using rower at CR.  pt feels activity is getting easier and more comfortable for her.  Pt is making great progress in cardiac rehab and notice an increase in UE/core strength,mobility and overall fitness levels. Pt is making great progress in cardiac rehab and notice an increase in MET levels and energy levels    Expected Outcomes Pt will be compliant with HEP and improve in aerobic fitness level Pt will be compliant with HEP and improve in aerobic fitness level Pt will be compliant with HEP and improve in aerobic fitness level  and improve in aerobic fitness levels and overall functional capacity.        Discharge Exercise Prescription (Final Exercise Prescription Changes):     Exercise Prescription Changes - 01/06/17 1400      Response to Exercise   Blood Pressure (Admit) 104/62   Blood Pressure (Exercise) 130/70   Blood Pressure (Exit) 100/70   Heart Rate (Admit) 65 bpm   Heart Rate (Exercise) 139 bpm   Heart Rate (Exit) 75 bpm   Rating of Perceived Exertion (Exercise) 13   Symptoms none   Duration Continue with 30 min of aerobic exercise without signs/symptoms of physical distress.   Intensity THRR unchanged     Progression   Progression Continue to progress workloads to maintain intensity without signs/symptoms of physical distress.   Average METs 4.9     Resistance Training   Training Prescription Yes   Weight 3lbs   Reps 10-15    Time 10 Minutes     Bike   Level 1   Minutes 10   METs 4.22     NuStep   Level 4   SPM 90   Minutes 10   METs 4.6     Rower   Level 3   Watts 48   METs 6     Home Exercise Plan   Plans to continue exercise at Home (comment)  elliptical- starting at 5 min. multiple times a day   Frequency Add 3 additional days to program exercise sessions.   Initial Home Exercises Provided 10/10/16      Nutrition:  Target Goals: Understanding of nutrition guidelines, daily intake of sodium <1511m, cholesterol <2059m calories 30% from fat and 7% or less from saturated fats, daily to have 5 or more servings of fruits and vegetables.  Biometrics:     Pre Biometrics - 09/16/16 1237      Pre Biometrics   Waist Circumference 30 inches   Hip Circumference 36 inches   Waist to Hip Ratio 0.83 %   Triceps Skinfold 32 mm   % Body Fat 36.7 %   Grip Strength 29 kg   Flexibility 7.5 in   Single Leg Stand 30 seconds       Nutrition Therapy Plan and Nutrition Goals:     Nutrition Therapy & Goals - 10/08/16 1045      Nutrition Therapy   Diet Carb Modified, Therapeutic Lifestyle Changes      Personal Nutrition Goals   Nutrition Goal Pt to have a basic understanding of a diabetic diet.      Intervention Plan   Intervention Prescribe, educate and  counsel regarding individualized specific dietary modifications aiming towards targeted core components such as weight, hypertension, lipid management, diabetes, heart failure and other comorbidities.;Nutrition handout(s) given to patient.  Pre-diabetes   Expected Outcomes Short Term Goal: Understand basic principles of dietary content, such as calories, fat, sodium, cholesterol and nutrients.;Long Term Goal: Adherence to prescribed nutrition plan.      Nutrition Discharge: Nutrition Scores:     Nutrition Assessments - 10/08/16 1045      MEDFICTS Scores   Pre Score 15      Nutrition Goals Re-Evaluation:   Nutrition Goals  Re-Evaluation:   Nutrition Goals Discharge (Final Nutrition Goals Re-Evaluation):   Psychosocial: Target Goals: Acknowledge presence or absence of significant depression and/or stress, maximize coping skills, provide positive support system. Participant is able to verbalize types and ability to use techniques and skills needed for reducing stress and depression.  Initial Review & Psychosocial Screening:     Initial Psych Review & Screening - 09/16/16 1600      Initial Review   Current issues with None Identified     Family Dynamics   Good Support System? Yes  Husband    Comments upon brief assessment, no psychosocial needs identified, no interventions necessary      Barriers   Psychosocial barriers to participate in program There are no identifiable barriers or psychosocial needs.     Screening Interventions   Interventions Encouraged to exercise;Provide feedback about the scores to participant      Quality of Life Scores:     Quality of Life - 09/26/16 1057      Quality of Life Scores   Health/Function Pre 27.9 %  QOL reviewed with pt.  DOE and fatigue are primary concerns.  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     Family Pre --  pt has positive family relations and enjoys spending alone time reading, photography hobby and watching documentaries.     GLOBAL Pre --  overall pt discplaysgood coping skills with positive hopeful outlook and attitude.       PHQ-9: Recent Review Flowsheet Data    Depression screen [facility] 2/9 09/26/2016   Decreased Interest 0   Down, Depressed, Hopeless 0   PHQ - 2 Score 0     Interpretation of Total Score  Total Score Depression Severity:  1-4 = Minimal depression, 5-9 = Mild depression, 10-14 = Moderate depression, 15-19 = Moderately severe depression, 20-27 = Severe depression    Psychosocial Evaluation and Intervention:     Psychosocial Evaluation - 09/26/16 1044      Psychosocial Evaluation & Interventions   Interventions Encouraged to exercise with the program and follow exercise prescription;Stress management education   Comments health related anxiety.  pt is followed by personal therapist for prior situational stress.  pt overall displays positive outlook with good coping skills. pt feels confident in her ability to recover emotionally from her recent cardiac event.     Expected Outcomes pt will display positive outlood wtih good coping skkills.    Continue Psychosocial Services  Follow up required by staff      Psychosocial Re-Evaluation:     Psychosocial Re-Evaluation    [place] Name 10/29/16 1047 11/14/16 1047 12/09/16 1700 01/08/17 0903       Psychosocial Re-Evaluation   Current issues with Current Anxiety/Panic Current Stress Concerns;Current Anxiety/Panic  -  Current Stress Concerns;Current Anxiety/Panic    Comments health related anxiety is improving. pt reports feeling more comfortable and cofident with her CR activities.  pt consistently attending CR exercise and education classes.  pt tearful at rehab.  pt overwhelmed with situational stress.  pt encouraged to make appointment with therapist to discuss.  pt offered appt with Jeanella Craze.  pt declined due to having personal therapist. pt also encouraged to participate in self care stress management techniques. pt offered emotioanl support and reassurance.  pt demonstrates improved outlook and mood.  pt situational stress has resolved.  pt encouraged to participate in self care stress managemnt techniques. pt offered emotional support and reassurance.  pt contines to demonstrate improved outlook and mood.  pt situational stress has resolved.  pt encouraged to participate in self care stress managemnt techniques. pt offered emotional support and reassurance.     Expected Outcomes pt will demonstrate  positive outlook with good coping skills. pt will demonstrate positive outlook with good coping skills.  - pt will demonstrate positive outlook with good coping skills.    Interventions Stress management education;Encouraged to attend Cardiac Rehabilitation for the exercise;Relaxation education Stress management education;Encouraged to attend Cardiac Rehabilitation for the exercise;Relaxation education Stress management education;Encouraged to attend Cardiac Rehabilitation for the exercise;Relaxation education Stress management education;Encouraged to attend Cardiac Rehabilitation for the exercise;Relaxation education    Continue Psychosocial Services  Follow up required by staff Follow up required by staff Follow up required by staff Follow up required by staff       Psychosocial Discharge (Final Psychosocial Re-Evaluation):     Psychosocial Re-Evaluation - 01/08/17 0903      Psychosocial Re-Evaluation   Current issues with Current Stress Concerns;Current Anxiety/Panic   Comments pt contines to demonstrate improved outlook and mood.  pt situational stress has resolved.  pt encouraged to participate in self care stress managemnt techniques. pt offered emotional support and reassurance.    Expected Outcomes pt will demonstrate positive outlook with good coping skills.   Interventions Stress management education;Encouraged to attend Cardiac Rehabilitation for the exercise;Relaxation education   Continue Psychosocial Services  Follow up required by staff      Vocational Rehabilitation: Provide vocational rehab assistance to qualifying candidates.   Vocational Rehab Evaluation & Intervention:     Vocational Rehab - 09/16/16 1558      Initial Vocational Rehab Evaluation & Intervention   Assessment shows need for Vocational Rehabilitation --  pt did not indicate       Education: Education Goals: Education classes will be provided on a weekly basis, covering required topics. Participant  will state understanding/return demonstration of topics presented.  Learning Barriers/Preferences:     Learning Barriers/Preferences - 09/16/16 1004      Learning Barriers/Preferences   Learning Preferences Written Material;Skilled Demonstration;Pictoral      Education Topics: Count Your Pulse:  -Group instruction provided by verbal instruction, demonstration, patient participation and written materials to support subject.  Instructors address importance of being able to find your pulse and how to count your pulse when at home without a heart monitor.  Patients get hands on experience counting their pulse with staff help and individually.   CARDIAC REHAB PHASE II EXERCISE from 12/24/2016 in [place]  Date  11/28/16  Educator  RN  Instruction Review Code  R- Review/reinforce      Heart Attack, Angina, and Risk Factor Modification:  -Group instruction provided by verbal instruction, video, and written materials to support subject.  Instructors address signs and symptoms of angina and heart attacks.    Also discuss risk factors for heart disease and how to make changes to improve heart health risk factors.   CARDIAC REHAB PHASE II EXERCISE from 12/24/2016 in [place]  Date  10/15/16  Instruction Review Code  2- meets goals/outcomes      Functional Fitness:  -Group instruction provided by verbal instruction, demonstration, patient participation, and written materials to support subject.  Instructors address safety measures for doing things around the house.  Discuss how to get up and down off the floor, how to pick things up properly, how to safely get out of a chair without assistance, and balance training.   CARDIAC REHAB PHASE II EXERCISE from 12/24/2016 in [place]  Date  11/07/16  Instruction Review Code  2- meets goals/outcomes      Meditation and Mindfulness:  -Group instruction  provided by verbal instruction, patient participation, and written materials to support subject.  Instructor addresses importance of mindfulness and meditation practice to help reduce stress and improve awareness.  Instructor also leads participants through a meditation exercise.    CARDIAC REHAB PHASE II EXERCISE from 12/24/2016 in [place]  Date  10/29/16  Instruction Review Code  2- meets goals/outcomes      Stretching for Flexibility and Mobility:  -Group instruction provided by verbal instruction, patient participation, and written materials to support subject.  Instructors lead participants through series of stretches that are designed to increase flexibility thus improving mobility.  These stretches are additional exercise for major muscle groups that are typically performed during regular warm up and cool down.   CARDIAC REHAB PHASE II EXERCISE from 12/24/2016 in [place]  Date  12/19/16  Instruction Review Code  2- meets goals/outcomes      Hands Only CPR:  -Group verbal, video, and participation provides a basic overview of AHA guidelines for community CPR. Role-play of emergencies allow participants the opportunity to practice calling for help and chest compression technique with discussion of AED use.   Hypertension: -Group verbal and written instruction that provides a basic overview of hypertension including the most recent diagnostic guidelines, risk factor reduction with self-care instructions and medication management.   CARDIAC REHAB PHASE II EXERCISE from 12/24/2016 in [place]  Date  10/31/16  Instruction Review Code  2- meets goals/outcomes       Nutrition I class: Heart Healthy Eating:  -Group instruction provided by PowerPoint slides, verbal discussion, and written materials to support subject matter. The instructor gives an explanation and review of the Therapeutic  Lifestyle Changes diet recommendations, which includes a discussion on lipid goals, dietary fat, sodium, fiber, plant stanol/sterol esters, sugar, and the components of a well-balanced, healthy diet.   CARDIAC REHAB PHASE II EXERCISE from 12/24/2016 in [place]  Date  10/08/16  Educator  RD  Instruction Review Code  Not applicable [class handouts given]      Nutrition II class: Lifestyle Skills:  -Group instruction provided by PowerPoint slides, verbal discussion, and written materials to support subject matter. The instructor gives an explanation and review of label reading, grocery shopping for heart health, heart healthy recipe modifications, and ways to make healthier choices when eating out.   CARDIAC REHAB PHASE II EXERCISE from 12/24/2016 in [place]  Date  10/08/16  Educator  RD  Instruction Review Code  Not applicable [class handouts given]      Diabetes Question & Answer:  -Group instruction provided by PowerPoint slides, verbal discussion, and written materials to support subject matter. The instructor gives an explanation and review of diabetes co-morbidities, pre- and post-prandial blood glucose goals, pre-exercise blood glucose goals, signs, symptoms, and treatment of hypoglycemia and hyperglycemia, and foot care basics.   CARDIAC REHAB PHASE II EXERCISE from 12/24/2016 in [place]  Date  12/05/16  Educator  RD  Instruction Review Code  2- meets goals/outcomes      Diabetes Blitz:  -Group instruction provided by PowerPoint slides, verbal discussion, and written materials to support subject matter. The instructor gives an explanation and review of the physiology behind type 1 and type 2 diabetes, diabetes medications and rational behind using different medications, pre- and post-prandial blood glucose recommendations and Hemoglobin A1c goals, diabetes diet, and exercise including  blood glucose guidelines for exercising safely.    Portion Distortion:  -Group instruction provided by PowerPoint slides, verbal discussion, written materials, and food models to support subject matter. The instructor gives an explanation of serving size versus portion size, changes in portions sizes over the last 20 years, and what consists of a serving from each food group.   CARDIAC REHAB PHASE II EXERCISE from 12/24/2016 in [place]  Date  11/12/16  Educator  RD  Instruction Review Code  2- meets goals/outcomes      Stress Management:  -Group instruction provided by verbal instruction, video, and written materials to support subject matter.  Instructors review role of stress in heart disease and how to cope with stress positively.     CARDIAC REHAB PHASE II EXERCISE from 12/24/2016 in [employer]  Date  11/19/16  Instruction Review Code  2- meets goals/outcomes      Exercising on Your Own:  -Group instruction provided by verbal instruction, power point, and written materials to support subject.  Instructors discuss benefits of exercise, components of exercise, frequency and intensity of exercise, and end points for exercise.  Also discuss use of nitroglycerin and activating EMS.  Review options of places to exercise outside of rehab.  Review guidelines for sex with heart disease.   CARDIAC REHAB PHASE II EXERCISE from 12/24/2016 in [place]  Date  10/08/16  Instruction Review Code  2- meets goals/outcomes      Cardiac Drugs I:  -Group instruction provided by verbal instruction and written materials to support subject.  Instructor reviews cardiac drug classes: antiplatelets, anticoagulants, beta blockers, and statins.  Instructor discusses reasons, side effects, and lifestyle considerations for each drug class.   Cardiac Drugs II:  -Group instruction provided by verbal instruction and  written materials to support subject.  Instructor reviews cardiac drug classes: angiotensin converting enzyme inhibitors (ACE-I), angiotensin II receptor blockers (ARBs), nitrates, and calcium channel blockers.  Instructor discusses reasons, side effects, and lifestyle considerations for each drug class.   CARDIAC REHAB PHASE II EXERCISE from 12/24/2016 in [place]  Date  12/03/16  Educator  Kennyth Lose  Instruction Review Code  2- meets goals/outcomes      Anatomy and Physiology of the Circulatory System:  Group verbal and written instruction and models provide basic cardiac anatomy and physiology, with the coronary electrical and arterial systems. Review of: AMI, Angina, Valve disease, Heart Failure, Peripheral Artery Disease, Cardiac Arrhythmia, Pacemakers, and  the ICD.   CARDIAC REHAB PHASE II EXERCISE from 12/24/2016 in [place]  Date  12/24/16  Instruction Review Code  2- meets goals/outcomes      Other Education:  -Group or individual verbal, written, or video instructions that support the educational goals of the cardiac rehab program.   Knowledge Questionnaire Score:     Knowledge Questionnaire Score - 09/16/16 1231      Knowledge Questionnaire Score   Pre Score 15/24      Core Components/Risk Factors/Patient Goals at Admission:     Personal Goals and Risk Factors at Admission - 09/16/16 1238      Core Components/Risk Factors/Patient Goals on Admission    Weight Management Weight Maintenance;Weight Loss;Yes   Intervention Weight Management: Develop a combined nutrition and exercise program designed to reach desired caloric intake, while maintaining appropriate intake of nutrient and fiber, sodium and fats, and appropriate energy expenditure required for the weight goal.;Weight Management: Provide education and appropriate resources to help participant work on and attain dietary goals.;Weight Management/Obesity:  Establish reasonable short term and long term weight goals.   Expected Outcomes Short Term: Continue to assess and modify interventions until short term weight is achieved;Long Term: Adherence to nutrition and physical activity/exercise program aimed toward attainment of established weight goal;Weight Maintenance: Understanding of the daily nutrition guidelines, which includes 25-35% calories from fat, 7% or less cal from saturated fats, less than 271m cholesterol, less than 1.5gm of sodium, & 5 or more servings of fruits and vegetables daily;Weight Loss: Understanding of general recommendations for a balanced deficit meal plan, which promotes 1-2 lb weight loss per week and includes a negative energy balance of [phone] kcal/d;Understanding recommendations for meals to include 15-35% energy as protein, 25-35% energy from fat, 35-60% energy from carbohydrates, less than 20109mof dietary cholesterol, 20-35 gm of total fiber daily;Understanding of distribution of calorie intake throughout the day with the consumption of 4-5 meals/snacks   Lipids Yes   Intervention Provide education and support for participant on nutrition & aerobic/resistive exercise along with prescribed medications to achieve LDL <7044mHDL >53m5m Expected Outcomes Short Term: Participant states understanding of desired cholesterol values and is compliant with medications prescribed. Participant is following exercise prescription and nutrition guidelines.;Long Term: Cholesterol controlled with medications as prescribed, with individualized exercise RX and with personalized nutrition plan. Value goals: LDL < 70mg61mL > 40 mg.      Core Components/Risk Factors/Patient Goals Review:      Goals and Risk Factor Review    Row Name 10/15/16 1617 10/29/16 1047 12/08/16 1201 01/08/17 0902       Core Components/Risk Factors/Patient Goals Review   Personal Goals Review Weight Management/Obesity;Lipids Weight Management/Obesity;Lipids Weight  Management/Obesity;Lipids Weight Management/Obesity;Lipids    Review pt feels positive about her current progression in cardiac rehab.  pt is actively working towards weight loss and exertion goals.  pt reports her fatigue is improving .  pt interested in changing exercise routine to include alternate equipment such as rower/eliptical.   pt feels positive about her current progression in cardiac rehab.  pt is actively working towards weight loss and exertion goals.     pt feels positive about her current progression in cardiac rehab.  pt is actively working towards weight loss and exertion goals.     pt feels positive about her current progression in cardiac rehab.  pt is continuing active lifestyle changes to facilitate weight loss and exertion goals.  Expected Outcomes pt will participate in CR exercise, nutrition and lifestyle education opportunities to decrease overall CAD risk factors.  pt will participate in CR exercise, nutrition and lifestyle education opportunities to decrease overall CAD risk factors.  pt will participate in CR exercise, nutrition and lifestyle education opportunities to decrease overall CAD risk factors.  pt will participate in CR exercise, nutrition and lifestyle education opportunities to decrease overall CAD risk factors.        Core Components/Risk Factors/Patient Goals at Discharge (Final Review):      Goals and Risk Factor Review - 01/08/17 0902      Core Components/Risk Factors/Patient Goals Review   Personal Goals Review Weight Management/Obesity;Lipids   Review pt feels positive about her current progression in cardiac rehab.  pt is continuing active lifestyle changes to facilitate weight loss and exertion goals.       Expected Outcomes pt will participate in CR exercise, nutrition and lifestyle education opportunities to decrease overall CAD risk factors.       ITP Comments:     ITP Comments    Row Name 09/16/16 1290 11/11/16 1601 12/09/16 1659  01/08/17 0902     ITP Comments Medical Director, Dr. Fransico Him Medical Director, Dr. Fransico Him Medical Director, Dr. Fransico Him Medical Director, Dr. Fransico Him       Comments: Pt is making expected progress toward personal goals after completing 25 sessions. Recommend continued exercise and life style modification education including  stress management and relaxation techniques to decrease cardiac risk profile.

## 2017-01-09 ENCOUNTER — Encounter (HOSPITAL_COMMUNITY)
Admission: RE | Admit: 2017-01-09 | Discharge: 2017-01-09 | Disposition: A | Discharge status: Home / Self Care | Payer: Federal, State, Local not specified - PPO | Source: Ambulatory Visit | Attending: Cardiovascular Disease | Admitting: Cardiovascular Disease

## 2017-01-09 DIAGNOSIS — Z951 Presence of aortocoronary bypass graft: Secondary | ICD-10-CM | POA: Diagnosis not present

## 2017-01-09 DIAGNOSIS — I2510 Atherosclerotic heart disease of native coronary artery without angina pectoris: Secondary | ICD-10-CM | POA: Diagnosis not present

## 2017-01-09 DIAGNOSIS — F419 Anxiety disorder, unspecified: Secondary | ICD-10-CM | POA: Diagnosis not present

## 2017-01-09 DIAGNOSIS — I214 Non-ST elevation (NSTEMI) myocardial infarction: Secondary | ICD-10-CM

## 2017-01-12 DIAGNOSIS — R220 Localized swelling, mass and lump, head: Secondary | ICD-10-CM | POA: Diagnosis not present

## 2017-01-12 DIAGNOSIS — L7211 Pilar cyst: Secondary | ICD-10-CM | POA: Diagnosis not present

## 2017-01-12 DIAGNOSIS — Z8719 Personal history of other diseases of the digestive system: Secondary | ICD-10-CM | POA: Diagnosis not present

## 2017-01-12 DIAGNOSIS — Z9889 Other specified postprocedural states: Secondary | ICD-10-CM | POA: Diagnosis not present

## 2017-01-14 ENCOUNTER — Encounter (HOSPITAL_COMMUNITY)
Admission: RE | Admit: 2017-01-14 | Discharge: 2017-01-14 | Disposition: A | Discharge status: Home / Self Care | Payer: Federal, State, Local not specified - PPO | Source: Ambulatory Visit | Attending: Cardiovascular Disease | Admitting: Cardiovascular Disease

## 2017-01-14 DIAGNOSIS — I214 Non-ST elevation (NSTEMI) myocardial infarction: Secondary | ICD-10-CM | POA: Diagnosis not present

## 2017-01-14 DIAGNOSIS — I2510 Atherosclerotic heart disease of native coronary artery without angina pectoris: Secondary | ICD-10-CM | POA: Diagnosis not present

## 2017-01-14 DIAGNOSIS — Z951 Presence of aortocoronary bypass graft: Secondary | ICD-10-CM

## 2017-01-14 DIAGNOSIS — F419 Anxiety disorder, unspecified: Secondary | ICD-10-CM | POA: Diagnosis not present

## 2017-01-16 ENCOUNTER — Encounter (HOSPITAL_COMMUNITY)
Admission: RE | Admit: 2017-01-16 | Discharge: 2017-01-16 | Disposition: A | Discharge status: Home / Self Care | Payer: Federal, State, Local not specified - PPO | Source: Ambulatory Visit | Attending: Cardiovascular Disease | Admitting: Cardiovascular Disease

## 2017-01-16 VITALS — Wt 132.3 lb

## 2017-01-16 DIAGNOSIS — Z951 Presence of aortocoronary bypass graft: Secondary | ICD-10-CM | POA: Diagnosis not present

## 2017-01-16 DIAGNOSIS — I214 Non-ST elevation (NSTEMI) myocardial infarction: Secondary | ICD-10-CM | POA: Diagnosis not present

## 2017-01-16 DIAGNOSIS — I2510 Atherosclerotic heart disease of native coronary artery without angina pectoris: Secondary | ICD-10-CM | POA: Diagnosis not present

## 2017-01-16 DIAGNOSIS — F419 Anxiety disorder, unspecified: Secondary | ICD-10-CM | POA: Diagnosis not present

## 2017-01-21 ENCOUNTER — Encounter (HOSPITAL_COMMUNITY): Payer: Federal, State, Local not specified - PPO

## 2017-01-21 ENCOUNTER — Encounter (HOSPITAL_COMMUNITY)
Admission: RE | Admit: 2017-01-21 | Discharge: 2017-01-21 | Disposition: A | Discharge status: Home / Self Care | Payer: Federal, State, Local not specified - PPO | Source: Ambulatory Visit | Attending: Cardiovascular Disease | Admitting: Cardiovascular Disease

## 2017-01-21 DIAGNOSIS — I2510 Atherosclerotic heart disease of native coronary artery without angina pectoris: Secondary | ICD-10-CM | POA: Diagnosis not present

## 2017-01-21 DIAGNOSIS — I214 Non-ST elevation (NSTEMI) myocardial infarction: Secondary | ICD-10-CM

## 2017-01-21 DIAGNOSIS — Z951 Presence of aortocoronary bypass graft: Secondary | ICD-10-CM | POA: Diagnosis not present

## 2017-01-21 DIAGNOSIS — F419 Anxiety disorder, unspecified: Secondary | ICD-10-CM | POA: Diagnosis not present

## 2017-01-22 ENCOUNTER — Ambulatory Visit (INDEPENDENT_AMBULATORY_CARE_PROVIDER_SITE_OTHER): Payer: Federal, State, Local not specified - PPO | Admitting: Pharmacist Clinician (PhC)/ Clinical Pharmacy Specialist

## 2017-01-22 DIAGNOSIS — E785 Hyperlipidemia, unspecified: Secondary | ICD-10-CM

## 2017-01-22 MED ORDER — ROSUVASTATIN CALCIUM 10 MG PO TABS: ORAL_TABLET | ORAL | 3 refills | Status: DC

## 2017-01-22 NOTE — Progress Notes (Signed)
Patient in office for first dose of Repatha  Taught self injection technique and patient administered first dose without problem.    Will repeat labs in late October when she is in to see Dr. [name] - [place] approved only x 3 months initiallly

## 2017-01-23 ENCOUNTER — Other Ambulatory Visit: Payer: Self-pay | Admitting: Pharmacist

## 2017-01-23 ENCOUNTER — Encounter (HOSPITAL_COMMUNITY)
Admission: RE | Admit: 2017-01-23 | Discharge: 2017-01-23 | Disposition: A | Discharge status: Home / Self Care | Payer: Federal, State, Local not specified - PPO | Source: Ambulatory Visit | Attending: Cardiovascular Disease | Admitting: Cardiovascular Disease

## 2017-01-23 ENCOUNTER — Encounter (HOSPITAL_COMMUNITY): Payer: Self-pay

## 2017-01-23 ENCOUNTER — Encounter (HOSPITAL_COMMUNITY): Payer: Federal, State, Local not specified - PPO

## 2017-01-23 DIAGNOSIS — I214 Non-ST elevation (NSTEMI) myocardial infarction: Secondary | ICD-10-CM

## 2017-01-23 DIAGNOSIS — Z951 Presence of aortocoronary bypass graft: Secondary | ICD-10-CM

## 2017-01-23 DIAGNOSIS — F419 Anxiety disorder, unspecified: Secondary | ICD-10-CM | POA: Diagnosis not present

## 2017-01-23 DIAGNOSIS — I2510 Atherosclerotic heart disease of native coronary artery without angina pectoris: Secondary | ICD-10-CM | POA: Diagnosis not present

## 2017-01-23 MED ORDER — EVOLOCUMAB 140 MG/ML ~~LOC~~ SOAJ: 140.0000 mg | SUBCUTANEOUS | 11 refills | Status: DC

## 2017-01-27 ENCOUNTER — Encounter: Payer: Self-pay | Admitting: Pharmacist Clinician (PhC)/ Clinical Pharmacy Specialist

## 2017-01-28 ENCOUNTER — Encounter (HOSPITAL_COMMUNITY): Payer: Federal, State, Local not specified - PPO

## 2017-01-30 ENCOUNTER — Encounter (HOSPITAL_COMMUNITY): Payer: Federal, State, Local not specified - PPO

## 2017-02-04 ENCOUNTER — Encounter (HOSPITAL_COMMUNITY): Payer: Federal, State, Local not specified - PPO

## 2017-02-06 ENCOUNTER — Encounter (HOSPITAL_COMMUNITY): Payer: Federal, State, Local not specified - PPO

## 2017-02-06 NOTE — Progress Notes (Signed)
Discharge Progress Report  Patient Details  Name: [name] MRN: [id number] Date of Birth: Aug 02, 1970 Referring Provider:     CARDIAC REHAB PHASE II ORIENTATION from 09/16/2016 in [place]  Referring Provider  Jenkins Rouge MD       Number of Visits: 36   Reason for Discharge:  Patient has met program and personal goals.  Smoking History:  History  Smoking Status  . Never Smoker  Smokeless Tobacco  . Never Used    Diagnosis:  07/11/16 NSTEMI (non-ST elevated myocardial infarction) ([facility])  07/12/16 S/P CABG x 1  ADL UCSD:   Initial Exercise Prescription:     Initial Exercise Prescription - 09/16/16 1200      Date of Initial Exercise RX and Referring Provider   Date 09/16/16   Referring Provider Jenkins Rouge MD     Bike   Level 0.4   Minutes 10   METs 2.37     NuStep   Level 2   SPM 70   Minutes 10   METs 2     Track   Laps 8   Minutes 10   METs 2.39     Prescription Details   Frequency (times per week) 3   Duration Progress to 30 minutes of continuous aerobic without signs/symptoms of physical distress     Intensity   THRR 40-80% of Max Heartrate 69-138   Ratings of Perceived Exertion 11-13   Perceived Dyspnea 0-4     Progression   Progression Continue to progress workloads to maintain intensity without signs/symptoms of physical distress.     Resistance Training   Training Prescription Yes   Weight 2lbs   Reps 10-15      Discharge Exercise Prescription (Final Exercise Prescription Changes):     Exercise Prescription Changes - 01/23/17 1330      Response to Exercise   Blood Pressure (Admit) 120/64   Blood Pressure (Exercise) 134/70   Blood Pressure (Exit) 118/64   Heart Rate (Admit) 71 bpm   Heart Rate (Exercise) 137 bpm   Heart Rate (Exit) 80 bpm   Rating of Perceived Exertion (Exercise) 12   Symptoms none   Duration Continue with 30 min of aerobic exercise without signs/symptoms of physical  distress.   Intensity THRR unchanged     Progression   Progression Continue to progress workloads to maintain intensity without signs/symptoms of physical distress.   Average METs 4.8     Resistance Training   Training Prescription Yes   Weight 3lbs   Reps 10-15   Time 10 Minutes     Bike   Level 1   Minutes 10   METs 4.08     NuStep   Level 4   SPM 90   Minutes 10   METs 4.3     Rower   Level 3   Watts 45   METs 6     Home Exercise Plan   Plans to continue exercise at Home (comment)   Frequency Add 3 additional days to program exercise sessions.   Initial Home Exercises Provided 10/10/16      Functional Capacity:     6 Minute Walk    Row Name 09/16/16 1231 01/16/17 1042 01/16/17 1100     6 Minute Walk   Phase Initial Discharge  -   Distance 1003 feet 1714 feet  -   Distance % Change  -  - 70.89 %   Walk Time 6 minutes 6 minutes  -   #  of Rest Breaks 0 0  -   MPH 1.9 3.2  -   METS 3.39 4.8  -   RPE 9 12  -   VO2 Peak 11.88 16.85  -   Symptoms No No  -   Resting HR 75 bpm 73 bpm  -   Resting BP 112/74 120/74  -   Max Ex. HR 104 bpm 110 bpm  -   Max Ex. BP 118/76 138/60  -   2 Minute Post BP 124/80 120/70  -   Row Name 01/27/17 1329         6 Minute Walk   Distance Feet Change 711 ft        Psychological, QOL, Others - Outcomes: PHQ 2/9: Depression screen [facility] 2/9 01/23/2017 09/26/2016  Decreased Interest 0 0  Down, Depressed, Hopeless 0 0  PHQ - 2 Score 0 0    Quality of Life:     Quality of Life - 01/27/17 1334      Quality of Life Scores   Socioeconomic Pre 28.21 %   Socioeconomic Post 28.29 %   Socioeconomic % Change  0.28 %   Family Pre 28.8 %   Family Post 28.8 %   Family % Change 0 %   GLOBAL Pre 28.53 %   GLOBAL Post 28.54 %   GLOBAL % Change 0.04 %      Personal Goals: Goals established at orientation with interventions provided to work toward goal.     Personal Goals and Risk Factors at Admission - 09/16/16 1238       Core Components/Risk Factors/Patient Goals on Admission    Weight Management Weight Maintenance;Weight Loss;Yes   Intervention Weight Management: Develop a combined nutrition and exercise program designed to reach desired caloric intake, while maintaining appropriate intake of nutrient and fiber, sodium and fats, and appropriate energy expenditure required for the weight goal.;Weight Management: Provide education and appropriate resources to help participant work on and attain dietary goals.;Weight Management/Obesity: Establish reasonable short term and long term weight goals.   Expected Outcomes Short Term: Continue to assess and modify interventions until short term weight is achieved;Long Term: Adherence to nutrition and physical activity/exercise program aimed toward attainment of established weight goal;Weight Maintenance: Understanding of the daily nutrition guidelines, which includes 25-35% calories from fat, 7% or less cal from saturated fats, less than 231m cholesterol, less than 1.5gm of sodium, & 5 or more servings of fruits and vegetables daily;Weight Loss: Understanding of general recommendations for a balanced deficit meal plan, which promotes 1-2 lb weight loss per week and includes a negative energy balance of [phone] kcal/d;Understanding recommendations for meals to include 15-35% energy as protein, 25-35% energy from fat, 35-60% energy from carbohydrates, less than 2021mof dietary cholesterol, 20-35 gm of total fiber daily;Understanding of distribution of calorie intake throughout the day with the consumption of 4-5 meals/snacks   Lipids Yes   Intervention Provide education and support for participant on nutrition & aerobic/resistive exercise along with prescribed medications to achieve LDL <7045mHDL >45m37m Expected Outcomes Short Term: Participant states understanding of desired cholesterol values and is compliant with medications prescribed. Participant is following exercise  prescription and nutrition guidelines.;Long Term: Cholesterol controlled with medications as prescribed, with individualized exercise RX and with personalized nutrition plan. Value goals: LDL < 70mg36mL > 40 mg.       Personal Goals Discharge:     Goals and Risk Factor Review    Row Name 10/15/16 [id number]  10/29/16 1047 12/08/16 1201 01/08/17 0902 01/23/17 1720     Core Components/Risk Factors/Patient Goals Review   Personal Goals Review Weight Management/Obesity;Lipids Weight Management/Obesity;Lipids Weight Management/Obesity;Lipids Weight Management/Obesity;Lipids Weight Management/Obesity;Lipids   Review pt feels positive about her current progression in cardiac rehab.  pt is actively working towards weight loss and exertion goals.  pt reports her fatigue is improving .  pt interested in changing exercise routine to include alternate equipment such as rower/eliptical.   pt feels positive about her current progression in cardiac rehab.  pt is actively working towards weight loss and exertion goals.     pt feels positive about her current progression in cardiac rehab.  pt is actively working towards weight loss and exertion goals.     pt feels positive about her current progression in cardiac rehab.  pt is continuing active lifestyle changes to facilitate weight loss and exertion goals.     pt completed cardiac rehab program with 31 visits. pt made significant lifestyle changes and has been congratulated on her success. pt feels she has met her personal goals of  increased strength/stamina, increased energy with decreased fear and health related anxiety. pt plans to continue exercising in cardiac maintenance program.   Expected Outcomes pt will participate in CR exercise, nutrition and lifestyle education opportunities to decrease overall CAD risk factors.  pt will participate in CR exercise, nutrition and lifestyle education opportunities to decrease overall CAD risk factors.  pt will participate in CR  exercise, nutrition and lifestyle education opportunities to decrease overall CAD risk factors.  pt will participate in CR exercise, nutrition and lifestyle education opportunities to decrease overall CAD risk factors.  pt will continue in exercise, nutrition and lifestyle modifications to decrease overall risk factors.       Exercise Goals and Review:     Exercise Goals    Row Name 09/16/16 1239             Exercise Goals   Increase Physical Activity Yes  Learn activity and exercise limitations       Intervention Provide advice, education, support and counseling about physical activity/exercise needs.;Develop an individualized exercise prescription for aerobic and resistive training based on initial evaluation findings, risk stratification, comorbidities and participant's personal goals.       Expected Outcomes Achievement of increased cardiorespiratory fitness and enhanced flexibility, muscular endurance and strength shown through measurements of functional capacity and personal statement of participant.       Increase Strength and Stamina Yes  Build confidence with exercise and activity       Intervention Provide advice, education, support and counseling about physical activity/exercise needs.;Develop an individualized exercise prescription for aerobic and resistive training based on initial evaluation findings, risk stratification, comorbidities and participant's personal goals.       Expected Outcomes Achievement of increased cardiorespiratory fitness and enhanced flexibility, muscular endurance and strength shown through measurements of functional capacity and personal statement of participant.          Nutrition & Weight - Outcomes:     Pre Biometrics - 09/16/16 1237      Pre Biometrics   Waist Circumference 30 inches   Hip Circumference 36 inches   Waist to Hip Ratio 0.83 %   Triceps Skinfold 32 mm   % Body Fat 36.7 %   Grip Strength 29 kg   Flexibility 7.5 in   Single  Leg Stand 30 seconds         Post Biometrics - 01/16/17 1043  Post  Biometrics   Weight 132 lb 4.4 oz (60 kg)   Waist Circumference 31.25 inches   Hip Circumference 37 inches   Waist to Hip Ratio 0.84 %   Triceps Skinfold 32 mm   % Body Fat 37.5 %   Grip Strength 25 kg   Flexibility 9 in   Single Leg Stand 30 seconds      Nutrition:     Nutrition Therapy & Goals - 10/08/16 1045      Nutrition Therapy   Diet Carb Modified, Therapeutic Lifestyle Changes      Personal Nutrition Goals   Nutrition Goal Pt to have a basic understanding of a diabetic diet.      Intervention Plan   Intervention Prescribe, educate and counsel regarding individualized specific dietary modifications aiming towards targeted core components such as weight, hypertension, lipid management, diabetes, heart failure and other comorbidities.;Nutrition handout(s) given to patient.  Pre-diabetes   Expected Outcomes Short Term Goal: Understand basic principles of dietary content, such as calories, fat, sodium, cholesterol and nutrients.;Long Term Goal: Adherence to prescribed nutrition plan.      Nutrition Discharge:     Nutrition Assessments - 01/28/17 1506      MEDFICTS Scores   Pre Score 15   Post Score 24   Score Difference 9      Education Questionnaire Score:     Knowledge Questionnaire Score - 01/23/17 1619      Knowledge Questionnaire Score   Post Score 20/24      Goals reviewed with patient; copy given to patient.

## 2017-02-10 DIAGNOSIS — L7211 Pilar cyst: Secondary | ICD-10-CM | POA: Diagnosis not present

## 2017-02-11 ENCOUNTER — Encounter (HOSPITAL_COMMUNITY): Payer: Federal, State, Local not specified - PPO

## 2017-02-13 ENCOUNTER — Encounter (HOSPITAL_COMMUNITY): Payer: Federal, State, Local not specified - PPO

## 2017-02-17 ENCOUNTER — Encounter (HOSPITAL_COMMUNITY)
Admission: RE | Admit: 2017-02-17 | Discharge: 2017-02-17 | Disposition: A | Discharge status: Home / Self Care | Payer: Federal, State, Local not specified - PPO | Source: Ambulatory Visit | Attending: Cardiovascular Disease | Admitting: Cardiovascular Disease

## 2017-02-17 DIAGNOSIS — Z951 Presence of aortocoronary bypass graft: Secondary | ICD-10-CM | POA: Insufficient documentation

## 2017-02-17 DIAGNOSIS — I214 Non-ST elevation (NSTEMI) myocardial infarction: Secondary | ICD-10-CM | POA: Insufficient documentation

## 2017-02-18 ENCOUNTER — Encounter (HOSPITAL_COMMUNITY): Payer: Federal, State, Local not specified - PPO

## 2017-02-18 ENCOUNTER — Encounter (HOSPITAL_COMMUNITY)
Admission: RE | Admit: 2017-02-18 | Discharge: 2017-02-18 | Disposition: A | Discharge status: Home / Self Care | Payer: Federal, State, Local not specified - PPO | Source: Ambulatory Visit | Attending: Cardiovascular Disease | Admitting: Cardiovascular Disease

## 2017-02-18 DIAGNOSIS — I2510 Atherosclerotic heart disease of native coronary artery without angina pectoris: Secondary | ICD-10-CM | POA: Insufficient documentation

## 2017-02-18 DIAGNOSIS — F419 Anxiety disorder, unspecified: Secondary | ICD-10-CM | POA: Insufficient documentation

## 2017-02-18 DIAGNOSIS — Z951 Presence of aortocoronary bypass graft: Secondary | ICD-10-CM | POA: Insufficient documentation

## 2017-02-18 DIAGNOSIS — I214 Non-ST elevation (NSTEMI) myocardial infarction: Secondary | ICD-10-CM | POA: Insufficient documentation

## 2017-02-19 ENCOUNTER — Encounter (HOSPITAL_COMMUNITY): Payer: Federal, State, Local not specified - PPO

## 2017-02-20 ENCOUNTER — Encounter (HOSPITAL_COMMUNITY): Payer: Federal, State, Local not specified - PPO

## 2017-02-24 ENCOUNTER — Encounter (HOSPITAL_COMMUNITY): Payer: Self-pay

## 2017-02-24 DIAGNOSIS — Z951 Presence of aortocoronary bypass graft: Secondary | ICD-10-CM | POA: Insufficient documentation

## 2017-02-24 DIAGNOSIS — I214 Non-ST elevation (NSTEMI) myocardial infarction: Secondary | ICD-10-CM | POA: Diagnosis not present

## 2017-02-25 ENCOUNTER — Encounter (HOSPITAL_COMMUNITY): Payer: Federal, State, Local not specified - PPO

## 2017-02-26 ENCOUNTER — Encounter (HOSPITAL_COMMUNITY)
Admission: RE | Admit: 2017-02-26 | Discharge: 2017-02-26 | Disposition: A | Discharge status: Home / Self Care | Payer: Self-pay | Source: Ambulatory Visit | Attending: Cardiovascular Disease | Admitting: Cardiovascular Disease

## 2017-02-27 ENCOUNTER — Encounter (HOSPITAL_COMMUNITY): Payer: Federal, State, Local not specified - PPO

## 2017-03-03 ENCOUNTER — Encounter (HOSPITAL_COMMUNITY)
Admission: RE | Admit: 2017-03-03 | Discharge: 2017-03-03 | Disposition: A | Discharge status: Home / Self Care | Payer: Self-pay | Source: Ambulatory Visit | Attending: Cardiovascular Disease | Admitting: Cardiovascular Disease

## 2017-03-04 ENCOUNTER — Encounter (HOSPITAL_COMMUNITY)
Admission: RE | Admit: 2017-03-04 | Discharge: 2017-03-04 | Disposition: A | Discharge status: Home / Self Care | Payer: Self-pay | Source: Ambulatory Visit | Attending: Cardiovascular Disease | Admitting: Cardiovascular Disease

## 2017-03-04 ENCOUNTER — Encounter (HOSPITAL_COMMUNITY): Payer: Federal, State, Local not specified - PPO

## 2017-03-05 ENCOUNTER — Encounter (HOSPITAL_COMMUNITY): Payer: Self-pay

## 2017-03-06 ENCOUNTER — Encounter (HOSPITAL_COMMUNITY): Payer: Federal, State, Local not specified - PPO

## 2017-03-10 ENCOUNTER — Encounter (HOSPITAL_COMMUNITY): Payer: Self-pay

## 2017-03-11 ENCOUNTER — Encounter (HOSPITAL_COMMUNITY): Payer: Federal, State, Local not specified - PPO

## 2017-03-12 ENCOUNTER — Encounter (HOSPITAL_COMMUNITY): Payer: Self-pay

## 2017-03-12 ENCOUNTER — Telehealth: Payer: Self-pay | Admitting: Pharmacist Clinician (PhC)/ Clinical Pharmacy Specialist

## 2017-03-12 DIAGNOSIS — E785 Hyperlipidemia, unspecified: Secondary | ICD-10-CM

## 2017-03-13 ENCOUNTER — Encounter (HOSPITAL_COMMUNITY): Payer: Federal, State, Local not specified - PPO

## 2017-03-17 ENCOUNTER — Encounter: Payer: Self-pay | Admitting: Cardiovascular Disease

## 2017-03-17 ENCOUNTER — Ambulatory Visit (INDEPENDENT_AMBULATORY_CARE_PROVIDER_SITE_OTHER): Payer: Federal, State, Local not specified - PPO | Admitting: Cardiovascular Disease

## 2017-03-17 ENCOUNTER — Encounter (HOSPITAL_COMMUNITY): Payer: Self-pay

## 2017-03-17 VITALS — BP 152/88 | HR 77 | Ht 60.0 in | Wt 135.0 lb

## 2017-03-17 DIAGNOSIS — E785 Hyperlipidemia, unspecified: Secondary | ICD-10-CM

## 2017-03-17 DIAGNOSIS — I519 Heart disease, unspecified: Secondary | ICD-10-CM | POA: Diagnosis not present

## 2017-03-17 DIAGNOSIS — I255 Ischemic cardiomyopathy: Secondary | ICD-10-CM | POA: Diagnosis not present

## 2017-03-17 DIAGNOSIS — Z79899 Other long term (current) drug therapy: Secondary | ICD-10-CM | POA: Diagnosis not present

## 2017-03-17 DIAGNOSIS — Z951 Presence of aortocoronary bypass graft: Secondary | ICD-10-CM | POA: Diagnosis not present

## 2017-03-17 MED ORDER — CARVEDILOL 3.125 MG PO TABS: 3.1250 mg | ORAL_TABLET | Freq: Two times a day (BID) | ORAL | 3 refills | Status: DC

## 2017-03-17 NOTE — Assessment & Plan Note (Signed)
History of hyperlipidemia intolerant to statin therapy on Repatha . We will recheck a lipid liver profile in 2 weeks

## 2017-03-17 NOTE — Patient Instructions (Addendum)
Your physician has recommended you make the following change in your medication:  -- START carvedilol 3.125mg  twice daily -- TAKE Co-Q10 daily -- STOP crestor  Your physician recommends that you return for lab work in [place] - fasting to check cholesterol   Your physician recommends that you schedule a follow-up appointment in: [place] for BP check with pharmacist  Your physician has requested that you have an echocardiogram in 3 months. Echocardiography is a painless test that uses sound waves to create images of your heart. It provides your doctor with information about the size and shape of your heart and how well your heart's chambers and valves are working. This procedure takes approximately one hour. There are no restrictions for this procedure.  Your physician wants you to follow-up in: 6 months with Dr. [name]. You will receive a reminder letter in the mail two months in advance. If you don't receive a letter, please call our office to schedule the follow-up appointment.

## 2017-03-17 NOTE — Progress Notes (Signed)
03/17/2017 [name]   03/02/1971  [id number]  Primary Physician Lawerance Cruel, MD Primary Cardiologist: Lorretta Harp MD Lupe Carney, [place]  HPI:  [name] is a 46 y.o. female mildly overweight married Caucasian female mother of one 59 year old daughter.  I last saw her in the office 09/17/16. She underwent coronary artery bypass grafting times one emergently by Dr. Cyndia Bent on 07/12/16. The remainder of her coronary arteries were unremarkable. She did have a non-STEMI on that day with progressively rising troponins and EKG changes. She had a 99% ostial LAD with anteroapical wall motion abnormality. She is recuperating nicely. She is about to start cardiac rehabilitation. She denies chest pain or shortness of breath. She does complain of some cramping in her legs which may be statin-related. Her last echo performed 10/21/16 revealed ejection fraction of 40-45% with wall motion and a all motion abnormality in the LAD distribution.   Current Meds  Medication Sig  . aspirin EC 325 MG EC tablet Take 1 tablet (325 mg total) by mouth daily.  . baclofen (LIORESAL) 10 MG tablet Take 10 mg by mouth 2 (two) times daily as needed (headache). Limit to two days per week.  . Evolocumab (REPATHA SURECLICK) 825 MG/ML SOAJ Inject 140 mg into the skin every 14 (fourteen) days.  Marland Kitchen zonisamide (ZONEGRAN) 100 MG capsule Take 100 mg by mouth at bedtime.  . [DISCONTINUED] atorvastatin (LIPITOR) 10 MG tablet Take 10 mg by mouth 3 (three) times a week.  . [DISCONTINUED] rosuvastatin (CRESTOR) 10 MG tablet Take 1 tablet by mouth 3-4 times per week as tolerated     Allergies  Allergen Reactions  . Bactrim Itching and Rash         Social History   Social History  . Marital status: Married    Spouse name: N/A  . Number of children: N/A  . Years of education: N/A   Occupational History  . Not on file.   Social History Main Topics  . Smoking status: Never Smoker  . Smokeless tobacco:  Never Used  . Alcohol use No  . Drug use: No  . Sexual activity: Yes    Birth control/ protection: None   Other Topics Concern  . Not on file   Social History Narrative  . No narrative on file     Review of Systems: General: negative for chills, fever, night sweats or weight changes.  Cardiovascular: negative for chest pain, dyspnea on exertion, edema, orthopnea, palpitations, paroxysmal nocturnal dyspnea or shortness of breath Dermatological: negative for rash Respiratory: negative for cough or wheezing Urologic: negative for hematuria Abdominal: negative for nausea, vomiting, diarrhea, bright red blood per rectum, melena, or hematemesis Neurologic: negative for visual changes, syncope, or dizziness All other systems reviewed and are otherwise negative except as noted above.    Blood pressure (!) 152/88, pulse 77, height 5' (1.524 m), weight 135 lb (61.2 kg), last menstrual period 08/10/2011.  General appearance: alert and no distress Neck: no adenopathy, no carotid bruit, no JVD, supple, symmetrical, trachea midline and thyroid not enlarged, symmetric, no tenderness/mass/nodules Lungs: clear to auscultation bilaterally Heart: soft outflow tract murmur Extremities: extremities normal, atraumatic, no cyanosis or edema Pulses: 2+ and symmetric Skin: Skin color, texture, turgor normal. No rashes or lesions Neurologic: Alert and oriented X 3, normal strength and tone. Normal symmetric reflexes. Normal coordination and gait  EKG normal sinus rhythm at 77with Q waves across her precordium consistent with a anteroseptal infarct. I personally reviewed  this EKG.  ASSESSMENT AND PLAN:   S/P CABG x 1 History of non-STEMI with cardiac catheterization by me 07/12/16 revealing a 99% ostial LAD lesion. She subsequently underwent coronary artery bypass grafting 1 by Dr. Cyndia Bent on 07/12/16 with a LIMA to her LAD.her EF at that time was 40-45% and by echo was recently performed 10/21/16 again  was 40-45% a wall motion abnormality in the LAD distribution. Her PA pressure was 28 mmHg. I am going to place her on ow-dose carvedilol and we'll have her see Erasmo Downer back in the office for blood pressure and heart rate assessment and titration.  Hyperlipidemia LDL goal <70 History of hyperlipidemia intolerant to statin therapy on Repatha . We will recheck a lipid liver profile in 2 weeks  Ischemic cardiomyopathy History of ischemic cardiomyopathy with an EF of 40-45% by 2-D echo performed 10/21/16. She is not on ACE inhibitor or beta blocker. I am going to start her on carvedilol 3.125 mg by mouth twice a day and have her titrated up by our Pharm D Erasmo Downer. We'll recheck a 2-D echo in 3 months.      Lorretta Harp MD FACP,FACC,FAHA, [facility] 03/17/2017 12:30 PM

## 2017-03-17 NOTE — Assessment & Plan Note (Signed)
History of ischemic cardiomyopathy with an EF of 40-45% by 2-D echo performed 10/21/16. She is not on ACE inhibitor or beta blocker. I am going to start her on carvedilol 3.125 mg by mouth twice a day and have her titrated up by our Pharm D Erasmo Downer. We'll recheck a 2-D echo in 3 months.

## 2017-03-17 NOTE — Assessment & Plan Note (Signed)
History of non-STEMI with cardiac catheterization by me 07/12/16 revealing a 99% ostial LAD lesion. She subsequently underwent coronary artery bypass grafting 1 by Dr. Cyndia Bent on 07/12/16 with a LIMA to her LAD.her EF at that time was 40-45% and by echo was recently performed 10/21/16 again was 40-45% a wall motion abnormality in the LAD distribution. Her PA pressure was 28 mmHg. I am going to place her on ow-dose carvedilol and we'll have her see Erasmo Downer back in the office for blood pressure and heart rate assessment and titration.

## 2017-03-18 ENCOUNTER — Encounter (HOSPITAL_COMMUNITY): Payer: Federal, State, Local not specified - PPO

## 2017-03-18 NOTE — Telephone Encounter (Signed)
Patient was in office to see MD - will have her repeat labs in 2-3 weeks and get re-authorization for Repatha at that time.

## 2017-03-19 ENCOUNTER — Encounter (HOSPITAL_COMMUNITY): Payer: Self-pay

## 2017-03-24 ENCOUNTER — Encounter (HOSPITAL_COMMUNITY): Payer: Self-pay

## 2017-03-24 ENCOUNTER — Encounter: Payer: Self-pay | Admitting: Pharmacist Clinician (PhC)/ Clinical Pharmacy Specialist

## 2017-03-24 ENCOUNTER — Ambulatory Visit (INDEPENDENT_AMBULATORY_CARE_PROVIDER_SITE_OTHER): Payer: Federal, State, Local not specified - PPO | Admitting: Pharmacist Clinician (PhC)/ Clinical Pharmacy Specialist

## 2017-03-24 DIAGNOSIS — I255 Ischemic cardiomyopathy: Secondary | ICD-10-CM

## 2017-03-24 MED ORDER — CARVEDILOL 6.25 MG PO TABS: 6.2500 mg | ORAL_TABLET | Freq: Two times a day (BID) | ORAL | 3 refills | Status: DC

## 2017-03-24 NOTE — Progress Notes (Signed)
03/24/2017 [name] 1971/01/19 [id number]   HPI:  [name] is a 46 y.o. female patient of Dr [name], with a PMH below who presents today for beta blocker titration.   Patient has a history of ischemic cardiomyopathy with EF 40-45%.  Her other medical history is significant for non-STEMI (06/2016) with CABG x 1 and hyperlipidemia.  She is currently on Repatha for this and is due for her first labs in a few weeks.    Patient reports no problems with the carvedilol at this time.  She admits to having a head cold/flu for past week and isn't sure if her feeling off kilter and slightly short of breath are due to the cold, daily doses of Nyquil or her new medication.      Blood Pressure Goal:  130/80  Current Medications:  Carvedilol 3.125 mg bid  Family Hx:  Patient is adopted, has no known family history  Diet:  Mainly eats home cooked foods, with occasional eating in restaurants.  She does not eat fast food or fried foods.  Tries to watch her diet due to long term hyperlipidemia  Exercise:  In Cardiac rehab 3 times per week, although has missed out on last week due to cold/flu   Home BP readings:  No home BP cuff  Intolerances:    No cardiac medication intolerances  CrCl cannot be calculated (Patient's most recent lab result is older than the maximum 21 days allowed.).  Wt Readings from Last 3 Encounters:  03/17/17 135 lb (61.2 kg)  01/16/17 132 lb 4.4 oz (60 kg)  09/17/16 130 lb (59 kg)   BP Readings from Last 3 Encounters:  03/24/17 136/84  03/17/17 (!) 152/88  09/17/16 112/76   Pulse Readings from Last 3 Encounters:  03/24/17 64  03/17/17 77  09/17/16 86    Current Outpatient Prescriptions  Medication Sig Dispense Refill  . aspirin EC 325 MG EC tablet Take 1 tablet (325 mg total) by mouth daily. 30 tablet 0  . baclofen (LIORESAL) 10 MG tablet Take 10 mg by mouth 2 (two) times daily as needed (headache). Limit to two days per week.    . carvedilol (COREG)  6.25 MG tablet Take 1 tablet (6.25 mg total) by mouth 2 (two) times daily. 60 tablet 3  . Evolocumab (REPATHA SURECLICK) 419 MG/ML SOAJ Inject 140 mg into the skin every 14 (fourteen) days. 2 pen 11  . zonisamide (ZONEGRAN) 100 MG capsule Take 100 mg by mouth at bedtime.     No current facility-administered medications for this visit.     Allergies  Allergen Reactions  . Bactrim Itching and Rash         Past Medical History:  Diagnosis Date  . Anemia    due to heavy periods  . Anxiety   . Coronary artery disease   . Difficult intubation 01/22/2016  . Headache   . Heart murmur    aschild antibiotics if having Dental work  . Hypocholesteremia    medical treatment  . Ischemic cardiomyopathy   . Menorrhagia   . NSTEMI (non-ST elevated myocardial infarction) ([place])   . Pelvic pain     Blood pressure 136/84, pulse 64, last menstrual period 08/10/2011. Standing BP 130/84  Ischemic cardiomyopathy Patient doing well on carvedilol 3.125 mg dose.  She did have a drop in both heart rate and blood pressure, but still has room for titration of carvedilol.  Will increase her dose to 6.25 mg twice daily and  have her return in 3 weeks for a follow up appointment.     Tommy Medal PharmD CPP [facility] [address] [place], [place] [address] [phone]

## 2017-03-24 NOTE — Patient Instructions (Signed)
Return for a a follow up appointment in 3 weeks  Your blood pressure today is 136/84 (goal is < 130/80)  Take your  meds as follows:  Increase carvedilol to 6.25 mg twice daily (take 2 of the 3.125 mg tabs twice daily until gone)  Bring all of your meds, your BP cuff and your record of home blood pressures to your next appointment.  Exercise as you're able, try to walk approximately 30 minutes per day.  Keep salt intake to a minimum, especially watch canned and prepared boxed foods.  Eat more fresh fruits and vegetables and fewer canned items.  Avoid eating in fast food restaurants.    HOW TO TAKE YOUR BLOOD PRESSURE: . Rest 5 minutes before taking your blood pressure. .  Don't smoke or drink caffeinated beverages for at least 30 minutes before. . Take your blood pressure before (not after) you eat. . Sit comfortably with your back supported and both feet on the floor (don't cross your legs). . Elevate your arm to heart level on a table or a desk. . Use the proper sized cuff. It should fit smoothly and snugly around your bare upper arm. There should be enough room to slip a fingertip under the cuff. The bottom edge of the cuff should be 1 inch above the crease of the elbow. . Ideally, take 3 measurements at one sitting and record the average.

## 2017-03-24 NOTE — Assessment & Plan Note (Signed)
Patient doing well on carvedilol 3.125 mg dose.  She did have a drop in both heart rate and blood pressure, but still has room for titration of carvedilol.  Will increase her dose to 6.25 mg twice daily and have her return in 3 weeks for a follow up appointment.

## 2017-03-26 ENCOUNTER — Ambulatory Visit: Payer: Federal, State, Local not specified - PPO

## 2017-03-26 ENCOUNTER — Encounter (HOSPITAL_COMMUNITY): Payer: Self-pay

## 2017-03-26 DIAGNOSIS — I214 Non-ST elevation (NSTEMI) myocardial infarction: Secondary | ICD-10-CM | POA: Insufficient documentation

## 2017-03-26 DIAGNOSIS — Z951 Presence of aortocoronary bypass graft: Secondary | ICD-10-CM | POA: Insufficient documentation

## 2017-03-30 ENCOUNTER — Telehealth: Payer: Self-pay | Admitting: Cardiovascular Disease

## 2017-03-30 DIAGNOSIS — R42 Dizziness and giddiness: Secondary | ICD-10-CM | POA: Diagnosis not present

## 2017-03-30 IMAGING — CR DG CHEST 1V PORT
1 series · 1 of 1 positions shown · non-contrast
Comparison: Chest radiograph 07/11/2016.

CLINICAL DATA: Patient status post CABG procedure.

EXAM:
PORTABLE CHEST 1 VIEW

[AP]
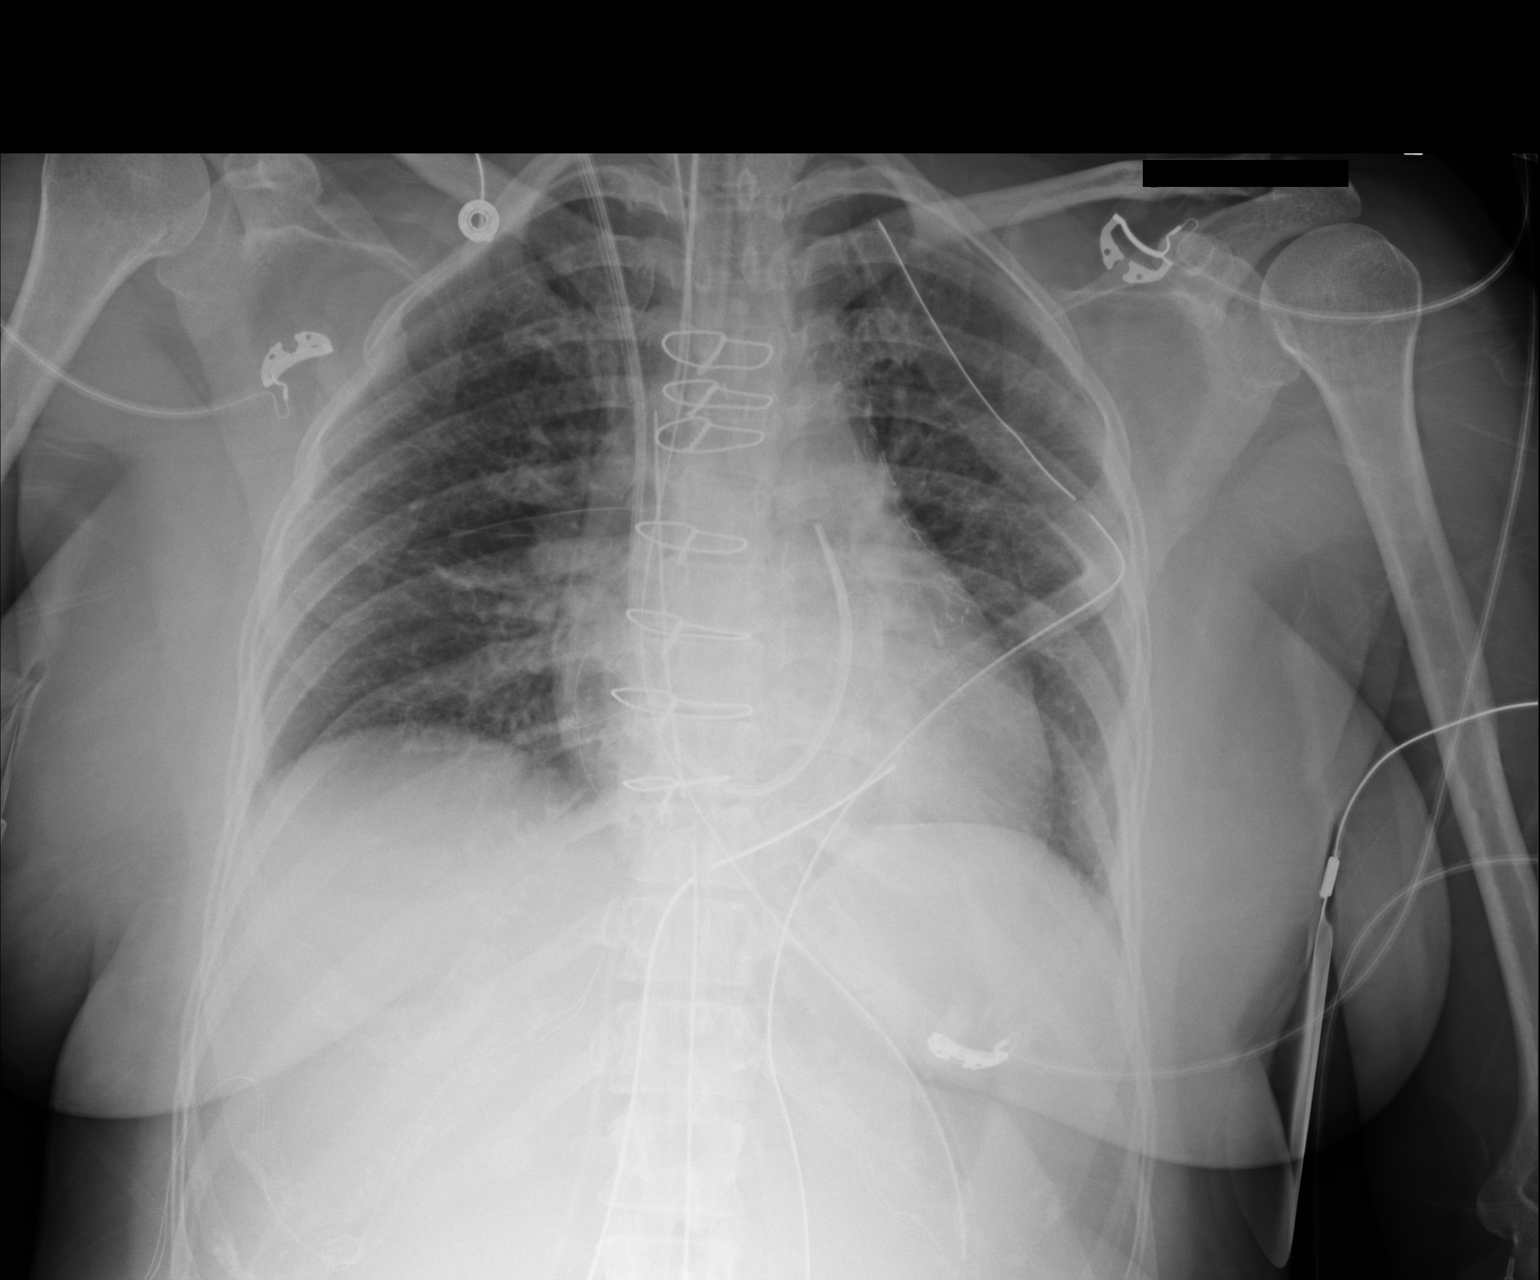

[1 of 1 positions shown; findings below may reference images not displayed]

FINDINGS: ET tube terminates in the mid trachea. Enteric tube courses inferior
to the diaphragm. Right IJ approach central venous catheter scratch
the right IJ approach PA catheter tip projects over the main
pulmonary artery. Mediastinal drains in place. Left chest tube in
place. No large area of pulmonary consolidation. Mild bilateral
interstitial opacities. No pleural effusion or pneumothorax.
IMPRESSION: Support apparatus as above.

Mild interstitial opacities may represent mild edema.

## 2017-03-30 NOTE — Telephone Encounter (Signed)
New Message     Pt c/o BP issue: STAT if pt c/o blurred vision, one-sided weakness or slurred speech  1. What are your last 5 BP readings? 180/100 yesterday   2. Are you having any other symptoms (ex. Dizziness, headache, blurred vision, passed out)? Dizzy nausea , tingling in her hands   3. What is your BP issue? Pt states her heart rate is dropping below 50

## 2017-03-30 NOTE — Telephone Encounter (Signed)
Returned the call to the patient. She stated that yesterday she was not feeling good. She had dizziness and tired. She is not able to check her blood pressure at home. She went to the fire station and her blood pressure was 170/100. EMS was called and the EKG was normal. She did not go to the hospital.  According to her apple watch, her heart rate is 57 this morning. She stated that it runs in the low 50's at during the night. She recently had an increase in her Carvedilol to 6.25 mg bid. She denies edema and chest pain. Will route to pharm D for their recommendation.

## 2017-03-31 ENCOUNTER — Encounter (HOSPITAL_COMMUNITY)
Admission: RE | Admit: 2017-03-31 | Discharge: 2017-03-31 | Disposition: A | Discharge status: Home / Self Care | Payer: Self-pay | Source: Ambulatory Visit | Attending: Cardiovascular Disease | Admitting: Cardiovascular Disease

## 2017-03-31 DIAGNOSIS — I214 Non-ST elevation (NSTEMI) myocardial infarction: Secondary | ICD-10-CM | POA: Diagnosis not present

## 2017-03-31 DIAGNOSIS — Z951 Presence of aortocoronary bypass graft: Secondary | ICD-10-CM | POA: Diagnosis not present

## 2017-03-31 IMAGING — CR DG CHEST 1V PORT
1 series · 1 of 1 positions shown · non-contrast
Comparison: July 12, 2016

CLINICAL DATA: Recent coronary artery bypass grafting.  Extubation.

EXAM:
PORTABLE CHEST 1 VIEW

[AP]
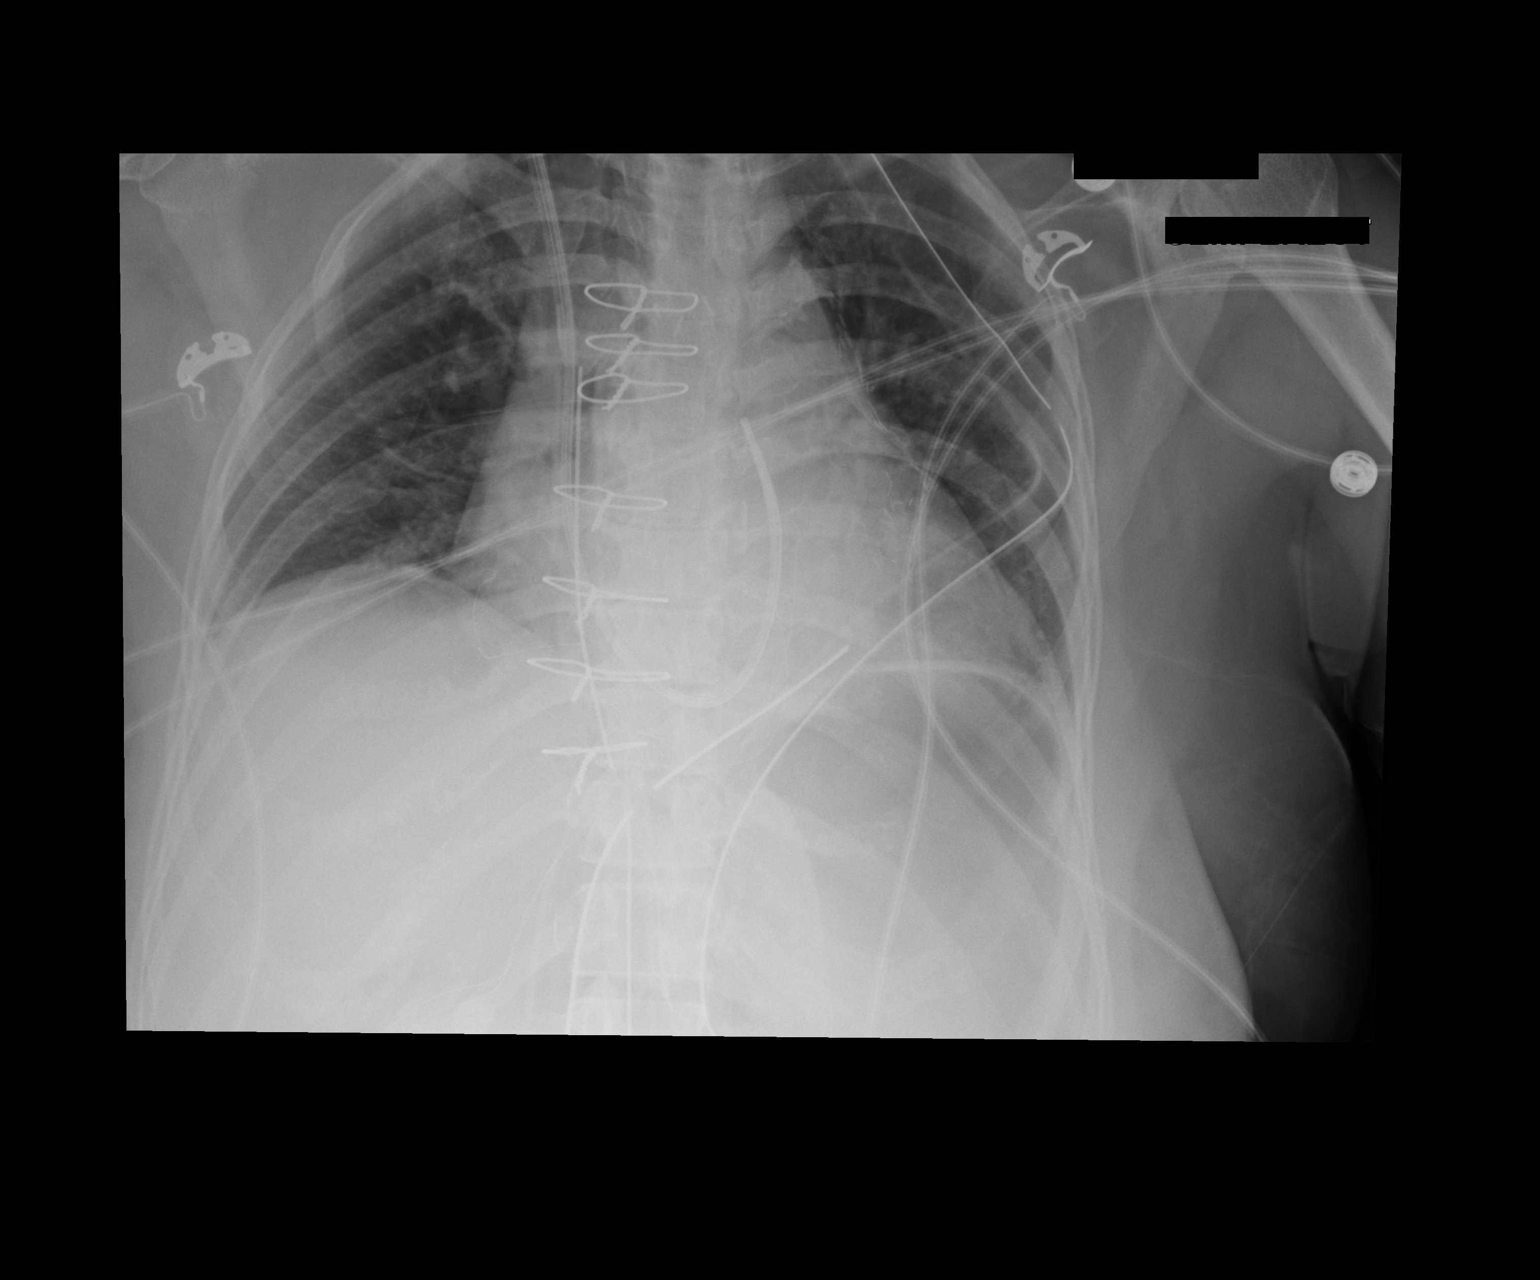

[1 of 1 positions shown; findings below may reference images not displayed]

FINDINGS: Endotracheal tube and nasogastric tube have been removed. Swan-Ganz
catheter tip is in the main pulmonary outflow tract. There is a
mediastinal drain as well as two chest tubes. Temporary pacemaker
wires are attached to the right heart. No appreciable pneumothorax.
There is atelectatic change in the left lower lobe. Lungs elsewhere
clear. Heart is prominent with pulmonary vascularity within normal
limits. No adenopathy. No bone lesions.
IMPRESSION: Tube and catheter positions as described without pneumothorax. Left
base atelectasis. Lungs elsewhere clear. Stable cardiac prominence.

## 2017-03-31 MED ORDER — CARVEDILOL 3.125 MG PO TABS
3.1250 mg | ORAL_TABLET | Freq: Two times a day (BID) | ORAL | Status: DC
Start: 2017-03-31 — End: 2017-04-03

## 2017-03-31 NOTE — Telephone Encounter (Signed)
Returned the call to the patient to instruct her to reduce her carvedilol to 3.125 mg bid. She verbalized her understanding.  She has an appointment with Kerin Ransom, [place] on Friday 11/9.

## 2017-03-31 NOTE — Telephone Encounter (Signed)
Lets cut the carvedilol back to 3.125 mg bid if she is still having symptoms.   She sees Lurena Joiner this week, he can add another antihypertensive if needed

## 2017-04-02 ENCOUNTER — Encounter (HOSPITAL_COMMUNITY): Payer: Self-pay

## 2017-04-03 ENCOUNTER — Ambulatory Visit: Payer: Federal, State, Local not specified - PPO | Admitting: Cardiology

## 2017-04-03 ENCOUNTER — Encounter: Payer: Self-pay | Admitting: Cardiology

## 2017-04-03 DIAGNOSIS — Z79899 Other long term (current) drug therapy: Secondary | ICD-10-CM | POA: Diagnosis not present

## 2017-04-03 DIAGNOSIS — E785 Hyperlipidemia, unspecified: Secondary | ICD-10-CM

## 2017-04-03 DIAGNOSIS — R5383 Other fatigue: Secondary | ICD-10-CM

## 2017-04-03 DIAGNOSIS — Z951 Presence of aortocoronary bypass graft: Secondary | ICD-10-CM | POA: Diagnosis not present

## 2017-04-03 DIAGNOSIS — I255 Ischemic cardiomyopathy: Secondary | ICD-10-CM

## 2017-04-03 LAB — LIPID PANEL
CHOLESTEROL TOTAL: 265 mg/dL — AB (ref 100–199)
Chol/HDL Ratio: 5.9 ratio — ABNORMAL HIGH (ref 0.0–4.4)
HDL: 45 mg/dL (ref >39)
LDL CALC: 176 mg/dL — AB (ref 0–99)
TRIGLYCERIDES: 220 mg/dL — AB (ref 0–149)
VLDL CHOLESTEROL CAL: 44 mg/dL — AB (ref 5–40)

## 2017-04-03 LAB — HEPATIC FUNCTION PANEL
ALBUMIN: 4.5 g/dL (ref 3.5–5.5)
ALK PHOS: 56 IU/L (ref 39–117)
ALT: 16 IU/L (ref 0–32)
AST: 20 IU/L (ref 0–40)
Bilirubin Total: 0.5 mg/dL (ref 0.0–1.2)
Bilirubin, Direct: 0.11 mg/dL (ref 0.00–0.40)
Total Protein: 7.4 g/dL (ref 6.0–8.5)

## 2017-04-03 NOTE — Progress Notes (Signed)
04/03/2017 [name]   [birth year]  [id number]  Primary Physician Lawerance Cruel, MD Primary Cardiologist: Dr [name]  HPI:   46 y/o female who had urgent CABG x 1 07/12/16 in the setting of an anterior NSTEMI. Post op she was unable to tolerate Lopressor 25 mg BID. A phone note from 07/25/16 indicates her B/P was low on this dose and her PCP called Dr Duffy Rhody to ask if they could cut it to 12.5 mg and they were instructed to stop it all together. The pt saw Dr [name] 03/17/17 and he started Coreg 3.125 mg BID. When she saw Cyril Mourning for titration the pt tells me she had a cold and was feeling poorly from that. She was increased to Coreg 6.25 mg 10/30.   She is in the office today for follow up. She says she feels terrible. She is tearful. She has multiple complaints SOB, localized chest pain, general fatigue. She attributes this to the Coreg, she says she felt fine before starting it.    Current Outpatient Medications  Medication Sig Dispense Refill  . aspirin EC 325 MG EC tablet Take 1 tablet (325 mg total) by mouth daily. 30 tablet 0  . carvedilol (COREG) 3.125 MG tablet Take 1 tablet (3.125 mg total) 2 (two) times daily by mouth.    . Evolocumab (REPATHA SURECLICK) 735 MG/ML SOAJ Inject 140 mg into the skin every 14 (fourteen) days. 2 pen 11   No current facility-administered medications for this visit.     Allergies  Allergen Reactions  . Bactrim Itching and Rash         Past Medical History:  Diagnosis Date  . Anemia    due to heavy periods  . Anxiety   . Coronary artery disease   . Difficult intubation 01/22/2016  . Headache   . Heart murmur    aschild antibiotics if having Dental work  . Hypocholesteremia    medical treatment  . Ischemic cardiomyopathy   . Menorrhagia   . NSTEMI (non-ST elevated myocardial infarction) ([place])   . Pelvic pain     Social History   Socioeconomic History  . Marital status: Married    Spouse name: Not on file  . Number of children:  Not on file  . Years of education: Not on file  . Highest education level: Not on file  Social Needs  . Financial resource strain: Not on file  . Food insecurity - worry: Not on file  . Food insecurity - inability: Not on file  . Transportation needs - medical: Not on file  . Transportation needs - non-medical: Not on file  Occupational History  . Not on file  Tobacco Use  . Smoking status: Never Smoker  . Smokeless tobacco: Never Used  Substance and Sexual Activity  . Alcohol use: No  . Drug use: No  . Sexual activity: Yes    Birth control/protection: None  Other Topics Concern  . Not on file  Social History Narrative  . Not on file     Family History  Adopted: Yes     Review of Systems: General: negative for chills, fever, night sweats or weight changes.  Cardiovascular: negative for chest pain, dyspnea on exertion, edema, orthopnea, palpitations, paroxysmal nocturnal dyspnea or shortness of breath Dermatological: negative for rash Respiratory: negative for cough or wheezing Urologic: negative for hematuria Abdominal: negative for nausea, vomiting, diarrhea, bright red blood per rectum, melena, or hematemesis Neurologic: negative for visual changes, syncope, or dizziness  All other systems reviewed and are otherwise negative except as noted above.    Last menstrual period 08/10/2011.  General appearance: alert, cooperative and no distress Neck: no carotid bruit and no JVD Lungs: clear to auscultation bilaterally Heart: regular rate and rhythm Extremities: extremities normal, atraumatic, no cyanosis or edema Skin: Skin color, texture, turgor normal. No rashes or lesions Neurologic: Grossly normal  EKG NSR, TWI 1, AVL, V2- no change  ASSESSMENT AND PLAN:   Fatigue due to treatment Pt was taken off Lopressor secondary to low BP by PCP and Dr Cyndia Bent. She could not tolerate Coreg either secondary to fatigue.  S/P CABG x 1 Urgent CABG x 26 Jun 2016  Ischemic  cardiomyopathy EF 40-45% by echo April 2018  Hyperlipidemia LDL goal <70 She is on Evolocumab   PLAN  I suggested she stop the Coreg. She has a f/u with Dr [name] 11/20 and will keep this.   Kerin Ransom PA-C 04/03/2017 11:09 AM

## 2017-04-03 NOTE — Assessment & Plan Note (Signed)
Pt was taken off Lopressor secondary to low BP by PCP and Dr Cyndia Bent. She could not tolerate Coreg either secondary to fatigue.

## 2017-04-03 NOTE — Patient Instructions (Signed)
Medication Instructions:  STOP CARVEDILOL   If you need a refill on your cardiac medications before your next appointment, please call your pharmacy.  Special Instructions: CANCELLED APPT WITH PHARMACIST THAT YOU HAD ON 04-14-17  Follow-Up: Your physician wants you to follow-up in: [place].  Thank you for choosing [facility] at [facility]!!

## 2017-04-03 NOTE — Assessment & Plan Note (Signed)
EF 40-45% by echo April 2018

## 2017-04-03 NOTE — Assessment & Plan Note (Signed)
She is on Evolocumab

## 2017-04-03 NOTE — Assessment & Plan Note (Signed)
Urgent CABG x 26 Jun 2016

## 2017-04-07 ENCOUNTER — Encounter (HOSPITAL_COMMUNITY): Payer: Self-pay

## 2017-04-08 ENCOUNTER — Encounter (HOSPITAL_COMMUNITY)
Admission: RE | Admit: 2017-04-08 | Discharge: 2017-04-08 | Disposition: A | Discharge status: Home / Self Care | Payer: Self-pay | Source: Ambulatory Visit | Attending: Cardiovascular Disease | Admitting: Cardiovascular Disease

## 2017-04-09 ENCOUNTER — Encounter (HOSPITAL_COMMUNITY)
Admission: RE | Admit: 2017-04-09 | Discharge: 2017-04-09 | Disposition: A | Discharge status: Home / Self Care | Payer: Self-pay | Source: Ambulatory Visit | Attending: Cardiovascular Disease | Admitting: Cardiovascular Disease

## 2017-04-10 ENCOUNTER — Telehealth: Payer: Self-pay | Admitting: Internal Medicine

## 2017-04-10 NOTE — Telephone Encounter (Signed)
Closed Encounter  °

## 2017-04-13 ENCOUNTER — Telehealth: Payer: Self-pay | Admitting: Cardiovascular Disease

## 2017-04-13 NOTE — Telephone Encounter (Signed)
-----   Message from Lorretta Harp, MD sent at 04/03/2017  4:55 PM EST ----- Doesn't appear to be responding to [place] . Please call her and see if she's begun the injections. If so refer her to Dr. [name] for further lipid management.

## 2017-04-14 ENCOUNTER — Encounter: Payer: Self-pay | Admitting: Cardiovascular Disease

## 2017-04-14 ENCOUNTER — Ambulatory Visit: Payer: Federal, State, Local not specified - PPO

## 2017-04-14 ENCOUNTER — Ambulatory Visit: Payer: Federal, State, Local not specified - PPO | Admitting: Cardiovascular Disease

## 2017-04-14 ENCOUNTER — Encounter (HOSPITAL_COMMUNITY): Payer: Self-pay

## 2017-04-14 VITALS — BP 142/89 | HR 75 | Ht 60.0 in | Wt 134.6 lb

## 2017-04-14 DIAGNOSIS — I255 Ischemic cardiomyopathy: Secondary | ICD-10-CM | POA: Diagnosis not present

## 2017-04-14 DIAGNOSIS — Z951 Presence of aortocoronary bypass graft: Secondary | ICD-10-CM

## 2017-04-14 DIAGNOSIS — E785 Hyperlipidemia, unspecified: Secondary | ICD-10-CM

## 2017-04-14 NOTE — Progress Notes (Signed)
04/14/2017 [name]   1970/07/26  [id number]  Primary Physician Lawerance Cruel, MD Primary Cardiologist: Lorretta Harp MD Lupe Carney, [place]  HPI:  [name] is a 46 y.o. female mildly overweight married Caucasian female mother of one 32 year old daughter.  I last saw her in the office 03/17/17 . She underwent coronary artery bypass grafting times one emergently by Dr. Cyndia Bent on 07/12/16. The remainder of her coronary arteries were unremarkable. She did have a non-STEMI on that day with progressively rising troponins and EKG changes. She had a 99% ostial LAD with anteroapical wall motion abnormality. She is recuperating nicely. She is about to start cardiac rehabilitation. She denies chest pain or shortness of breath. She does complain of some cramping in her legs which may be statin-related. Her last echo performed 10/21/16 revealed ejection fraction of 40-45% with wall motion and a all motion abnormality in the LAD distribution. She was begun on carvedilol which apparently she did not tolerate. 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    Current Meds  Medication Sig  . aspirin EC 325 MG EC tablet Take 1 tablet (325 mg total) by mouth daily.  . Coenzyme Q10 (COQ10) 200 MG CAPS Take by mouth daily.  . Evolocumab (REPATHA SURECLICK) 086 MG/ML SOAJ Inject 140 mg into the skin every 14 (fourteen) days.     Allergies  Allergen Reactions  . Bactrim Itching and Rash         Social History   Socioeconomic History  . Marital status: Married    Spouse name: Not on file  . Number of children: Not on file  . Years of education: Not on file  . Highest education level: Not on file  Social Needs  . Financial resource strain: Not on file  . Food insecurity - worry: Not on file  . Food insecurity - inability: Not on file  . Transportation needs - medical:  Not on file  . Transportation needs - non-medical: Not on file  Occupational History  . Not on file  Tobacco Use  . Smoking status: Never Smoker  . Smokeless tobacco: Never Used  Substance and Sexual Activity  . Alcohol use: No  . Drug use: No  . Sexual activity: Yes    Birth control/protection: None  Other Topics Concern  . Not on file  Social History Narrative  . Not on file     Review of Systems: General: negative for chills, fever, night sweats or weight changes.  Cardiovascular: negative for chest pain, dyspnea on exertion, edema, orthopnea, palpitations, paroxysmal nocturnal dyspnea or shortness of breath Dermatological: negative for rash Respiratory: negative for cough or wheezing Urologic: negative for hematuria Abdominal: negative for nausea, vomiting, diarrhea, bright red blood per rectum, melena, or hematemesis Neurologic: negative for visual changes, syncope, or dizziness All other systems reviewed and are otherwise negative except as noted above.    Blood pressure (!) 142/89, pulse 75, height 5' (1.524 m), weight 134 lb 9.6 oz (61.1 kg), last menstrual period 08/10/2011.  General appearance: alert and no distress Neck: no adenopathy, no carotid bruit, no JVD, supple, symmetrical, trachea midline and thyroid not enlarged, symmetric, no tenderness/mass/nodules Lungs: clear to auscultation bilaterally Heart: regular rate and rhythm, S1, S2 normal, no murmur, click, rub or gallop Extremities: extremities normal, atraumatic, no cyanosis or edema Pulses: 2+ and symmetric Skin: Skin  color, texture, turgor normal. No rashes or lesions Neurologic: Alert and oriented X 3, normal strength and tone. Normal symmetric reflexes. Normal coordination and gait  EKG not performed today  ASSESSMENT AND PLAN:   S/P CABG x 1 History of non-STEMI status post urgent CABG 07/12/16 due to a high-grade ostial LAD. She really not placed to her LAD by Dr. Cyndia Bent . She has been well  since.  Hyperlipidemia LDL goal <70 History of hyperlipidemia with lipid profile performed 04/03/17 and LDL 176 on Repatha . 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 She was switched to Praluent with the intent of switching to Juxtabid In the event that this was ineffective as well.  Ischemic cardiomyopathy History of ischemic cardiomyopathy with an EF in the 40-45% range by echo April 2018. She was placed on carvedilol which she did not tolerate. I'm going to recheck a 2-D echo next month if she continues to have LV dysfunction will start her on low-dose metoprolol.      Lorretta Harp MD FACP,FACC,FAHA, [facility] 04/14/2017 3:24 PM

## 2017-04-14 NOTE — Assessment & Plan Note (Signed)
History of non-STEMI status post urgent CABG 07/12/16 due to a high-grade ostial LAD. She really not placed to her LAD by Dr. Cyndia Bent . She has been well since.

## 2017-04-14 NOTE — Assessment & Plan Note (Signed)
History of hyperlipidemia with lipid profile performed 04/03/17 and LDL 176 on Repatha . 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 She was switched to Praluent with the intent of switching to Juxtabid In the event that this was ineffective as well.

## 2017-04-14 NOTE — Patient Instructions (Signed)
Medication Instructions: Your physician recommends that you continue on your current medications as directed. Please refer to the Current Medication list given to you today.   Labwork: Your physician recommends that you return for a FASTING lipid profile and hepatic function panel in mid-January.    Testing/Procedures: Your physician has requested that you have an echocardiogram. Echocardiography is a painless test that uses sound waves to create images of your heart. It provides your doctor with information about the size and shape of your heart and how well your heart's chambers and valves are working. This procedure takes approximately one hour. There are no restrictions for this procedure.  Follow-Up: Your physician recommends that you schedule a follow-up appointment in: 3 months with Dr. [name].  If you need a refill on your cardiac medications before your next appointment, please call your pharmacy.

## 2017-04-14 NOTE — Assessment & Plan Note (Signed)
History of ischemic cardiomyopathy with an EF in the 40-45% range by echo April 2018. She was placed on carvedilol which she did not tolerate. I'm going to recheck a 2-D echo next month if she continues to have LV dysfunction will start her on low-dose metoprolol.

## 2017-04-21 ENCOUNTER — Encounter (HOSPITAL_COMMUNITY)
Admission: RE | Admit: 2017-04-21 | Discharge: 2017-04-21 | Disposition: A | Discharge status: Home / Self Care | Payer: Self-pay | Source: Ambulatory Visit | Attending: Cardiovascular Disease | Admitting: Cardiovascular Disease

## 2017-04-22 ENCOUNTER — Encounter (HOSPITAL_COMMUNITY)
Admission: RE | Admit: 2017-04-22 | Discharge: 2017-04-22 | Disposition: A | Discharge status: Home / Self Care | Payer: Self-pay | Source: Ambulatory Visit | Attending: Cardiovascular Disease | Admitting: Cardiovascular Disease

## 2017-04-23 ENCOUNTER — Encounter (HOSPITAL_COMMUNITY)
Admission: RE | Admit: 2017-04-23 | Discharge: 2017-04-23 | Disposition: A | Discharge status: Home / Self Care | Payer: Self-pay | Source: Ambulatory Visit | Attending: Cardiovascular Disease | Admitting: Cardiovascular Disease

## 2017-04-24 ENCOUNTER — Ambulatory Visit: Payer: Federal, State, Local not specified - PPO | Admitting: Internal Medicine

## 2017-04-24 ENCOUNTER — Encounter: Payer: Self-pay | Admitting: Internal Medicine

## 2017-04-24 VITALS — BP 134/84 | HR 56 | Ht 60.0 in | Wt 134.0 lb

## 2017-04-24 DIAGNOSIS — E7849 Other hyperlipidemia: Secondary | ICD-10-CM | POA: Diagnosis not present

## 2017-04-24 DIAGNOSIS — Z951 Presence of aortocoronary bypass graft: Secondary | ICD-10-CM

## 2017-04-24 DIAGNOSIS — I2510 Atherosclerotic heart disease of native coronary artery without angina pectoris: Secondary | ICD-10-CM | POA: Diagnosis not present

## 2017-04-24 DIAGNOSIS — I255 Ischemic cardiomyopathy: Secondary | ICD-10-CM

## 2017-04-24 NOTE — Patient Instructions (Addendum)
Dr. [name] has recommended that you continue Praluent.   Please have FASTING lab work in January.   You have been referred to Dr. Broadus John (genetics). A scheduler will contact you about this appointment. A staff message was sent to scheduler to arrange appointment.

## 2017-04-24 NOTE — Progress Notes (Signed)
OFFICE NOTE  Chief Complaint:  Elevated cholesterol  Primary Care Physician: Lawerance Cruel, MD  HPI:  [name] is a 46 y.o. female with a past medial history significant for emergent single-vessel coronary artery bypass grafting with a 99% ostial LAD stenosis in February 2018.  She was not found to have any other significant obstructive coronary disease at the time.  Echo in May 2018 revealed an LVEF of 40-45% and an LAD wall motion abnormality.  He is a patient of Dr. [name].  She has an extensive history of dyslipidemia and was told by her uncle when she was a child in [place] that her cholesterol was very high and that she would likely need to be on treatment.  She is adopted and does not know her birth parents, but does have a daughter who is 29 years old.  She has not had cholesterol testing to her knowledge.  She had been started on statins, but reports significant intolerance, this is to high-dose atorvastatin, rosuvastatin, and possibly others that she cannot recall.  She was then referred to start [place].  Her lipid profile as of July 2018 showed total cholesterol 246, triglycerides 237, HDL 35 and LDL 164.  After starting therapy on Repatha, she received 4 doses, approximately 2 months of therapy and her repeat lipid profile showed a total cholesterol 265, triglycerides 220, LDL 176 and HDL of 45.  This is suggesting that she has had no response to the PCS canine inhibitor.  She reports compliance with the medication.  She had no side effects from it.  Possible reasons for this include a PCSK9 gain of function mutation or LDL receptor mutation.  Findings are possibly suggestive of familial hyperlipidemia although her LDL is moderately elevated.  Initially the plan was to consider switching her to Praluent and she has been provided samples, however, it is likely that she will have a similar poor response.  PMHx:  Past Medical History:  Diagnosis Date  . Anemia    due to heavy  periods  . Anxiety   . Coronary artery disease   . Difficult intubation 01/22/2016  . Headache   . Heart murmur    aschild antibiotics if having Dental work  . Hypocholesteremia    medical treatment  . Ischemic cardiomyopathy   . Menorrhagia   . NSTEMI (non-ST elevated myocardial infarction) ([place])   . Pelvic pain     Past Surgical History:  Procedure Laterality Date  . ABDOMINAL HYSTERECTOMY    . CORONARY ARTERY BYPASS GRAFT N/A 07/12/2016   Procedure: CORONARY ARTERY BYPASS GRAFTING (CABG), ON PUMP, TIMES ONE, USING LEFT INTERNAL MAMMARY ARTERY WITH TEE;  Surgeon: Gaye Pollack, MD;  Location: [facility];  Service: Open Heart Surgery;  Laterality: N/A;  LIMA to LAD  . goiter     left side goiter removed  . INGUINAL HERNIA REPAIR Bilateral 01/22/2016   Procedure: LAPAROSCOPIC BILATERAL FEMORAL AND RIGHT INGUINAL HERNIA REPAIRWITH INSERTION OF MESH;  Surgeon: Michael Boston, MD;  Location: [facility];  Service: General;  Laterality: Bilateral;  . LEFT HEART CATH AND CORONARY ANGIOGRAPHY N/A 07/12/2016   Procedure: Left Heart Cath and Coronary Angiography;  Surgeon: Lorretta Harp, MD;  Location: [facility];  Service: Cardiovascular;  Laterality: N/A;  . SVD     x 1  . WISDOM TOOTH EXTRACTION      FAMHx:  Family History  Adopted: Yes    SOCHx:   reports that  has never smoked.  she has never used smokeless tobacco. She reports that she does not drink alcohol or use drugs.  ALLERGIES:  Allergies  Allergen Reactions  . Bactrim Itching and Rash         ROS: Pertinent items noted in HPI and remainder of comprehensive ROS otherwise negative.  HOME MEDS: Current Outpatient Medications on File Prior to Visit  Medication Sig Dispense Refill  . aspirin EC 325 MG EC tablet Take 1 tablet (325 mg total) by mouth daily. 30 tablet 0  . Coenzyme Q10 (COQ10) 200 MG CAPS Take by mouth daily.     No current facility-administered medications on file prior to visit.      LABS/IMAGING: No results found for this or any previous visit (from the past 48 hour(s)). No results found.  LIPID PANEL:    Component Value Date/Time   CHOL 265 (H) 04/03/2017 1131   TRIG 220 (H) 04/03/2017 1131   HDL 45 04/03/2017 1131   CHOLHDL 5.9 (H) 04/03/2017 1131   CHOLHDL 7.0 (H) 12/12/2016 0750   VLDL 47 (H) 12/12/2016 0750   LDLCALC 176 (H) 04/03/2017 1131     WEIGHTS: Wt Readings from Last 3 Encounters:  04/24/17 134 lb (60.8 kg)  04/14/17 134 lb 9.6 oz (61.1 kg)  03/17/17 135 lb (61.2 kg)    VITALS: BP 134/84   Pulse (!) 56   Ht 5' (1.524 m)   Wt 134 lb (60.8 kg)   LMP 08/10/2011   SpO2 99%   BMI 26.17 kg/m   EXAM: Deferred  EKG: Deferred  ASSESSMENT: 1. Coronary artery disease status post single-vessel CABG to the LAD 2. Ischemic cardiomyopathy, EF 40-45% 3. Documented statin intolerance 4. Possible familial hyperlipidemia 5. PCS K9 nonresponder  PLAN: 1.   [name] has had single-vessel bypass for emergent presentation of chest pain and found to have high-grade LAD stenosis.  She suffered a nonischemic cardia myopathy with EF 40-45%.  Unfortunately she has been intolerant to numerous statin medications over the past 20 years.  She was started on Repatha without any improvement in her lipid profile, suggesting a possible familial hyperlipidemia.  Although one would expect a higher likelihood of FH in patients with LDL greater than 190, a smaller percentage of patients may have LDL is greater than 150.  Genetic testing would be helpful, particularly for screening and her daughter who may have an abnormal or borderline abnormal lipid profile.  I will refer her to our clinical geneticist for testing.  In addition, I agree with treatment on Praluent for now - will repeat a lipid profile in [place].  If her lipid profile has not significantly improved, I think it is reasonable to consider Juxtapid as an option for therapy.  Will follow-up on her  lipid results. Thanks for the kind referral.  Time Spent with Patient: I have spent a total of 25 minutes with patient reviewing hospital notes, telemetry, EKGs, labs and examining the patient as well as establishing an assessment and plan that was discussed with the patient. > 50% of time was spent in direct patient care.  Pixie Casino, MD, [facility], [place] Director of the Advanced Lipid Disorders &  Cardiovascular Risk Reduction Clinic Attending Cardiologist  Direct Dial: [phone]  Fax: [phone]  Website:  [email or web address] 04/24/2017, 1:31 PM

## 2017-04-28 ENCOUNTER — Encounter (HOSPITAL_COMMUNITY)
Admission: RE | Admit: 2017-04-28 | Discharge: 2017-04-28 | Disposition: A | Discharge status: Home / Self Care | Payer: Self-pay | Source: Ambulatory Visit | Attending: Cardiovascular Disease | Admitting: Cardiovascular Disease

## 2017-04-28 DIAGNOSIS — Z951 Presence of aortocoronary bypass graft: Secondary | ICD-10-CM | POA: Diagnosis not present

## 2017-04-28 DIAGNOSIS — I214 Non-ST elevation (NSTEMI) myocardial infarction: Secondary | ICD-10-CM | POA: Insufficient documentation

## 2017-04-29 ENCOUNTER — Encounter (HOSPITAL_COMMUNITY)
Admission: RE | Admit: 2017-04-29 | Discharge: 2017-04-29 | Disposition: A | Discharge status: Home / Self Care | Payer: Self-pay | Source: Ambulatory Visit | Attending: Cardiovascular Disease | Admitting: Cardiovascular Disease

## 2017-04-30 ENCOUNTER — Encounter (HOSPITAL_COMMUNITY)
Admission: RE | Admit: 2017-04-30 | Discharge: 2017-04-30 | Disposition: A | Discharge status: Home / Self Care | Payer: Federal, State, Local not specified - PPO | Source: Ambulatory Visit | Attending: Cardiovascular Disease | Admitting: Cardiovascular Disease

## 2017-05-01 IMAGING — CR DG CHEST 2V
2 series · 2 of 2 positions shown · non-contrast
Comparison: 07/14/2016

CLINICAL DATA: History of CABG.  Sore.

EXAM:
CHEST  2 VIEW

[w chest pa]
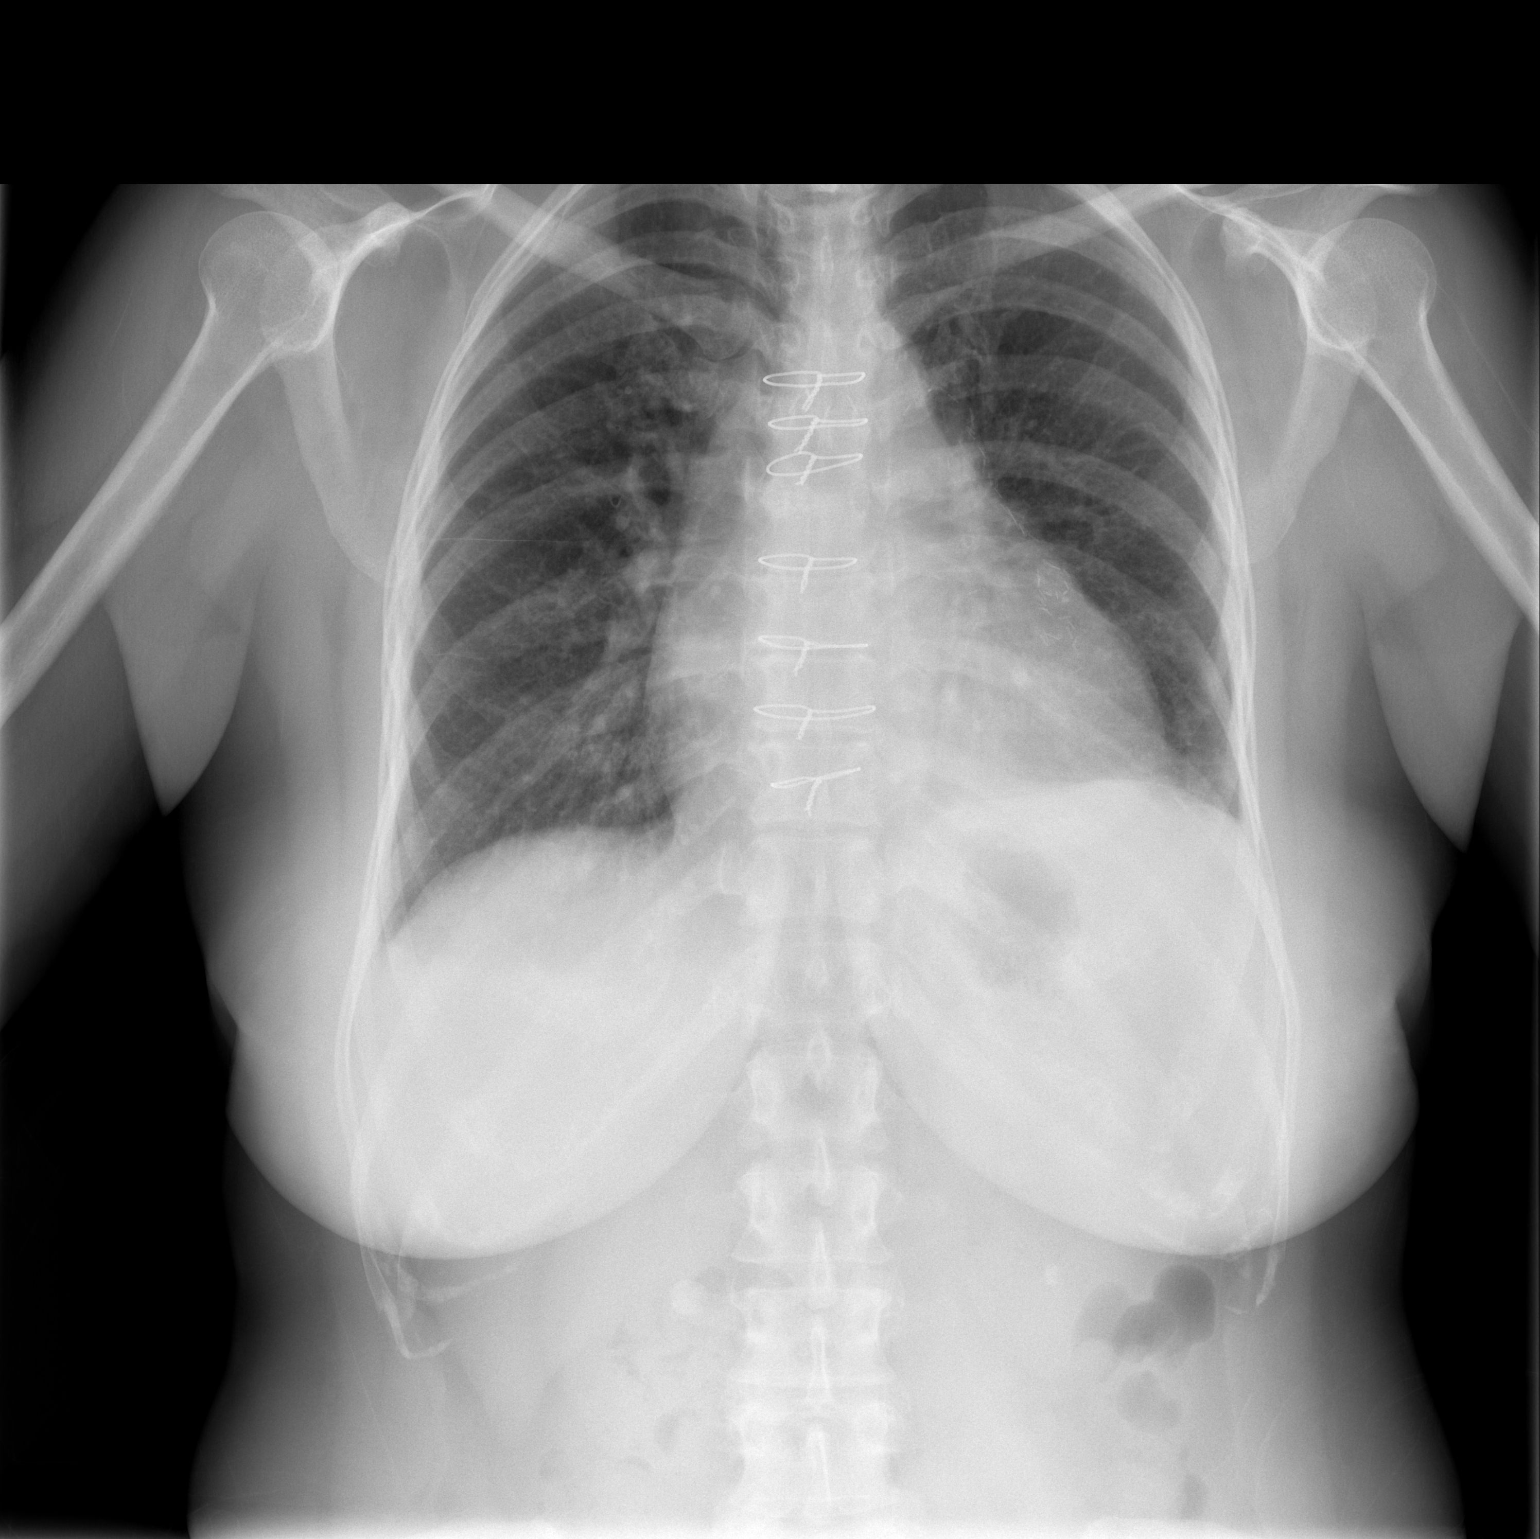

[w chest lat]
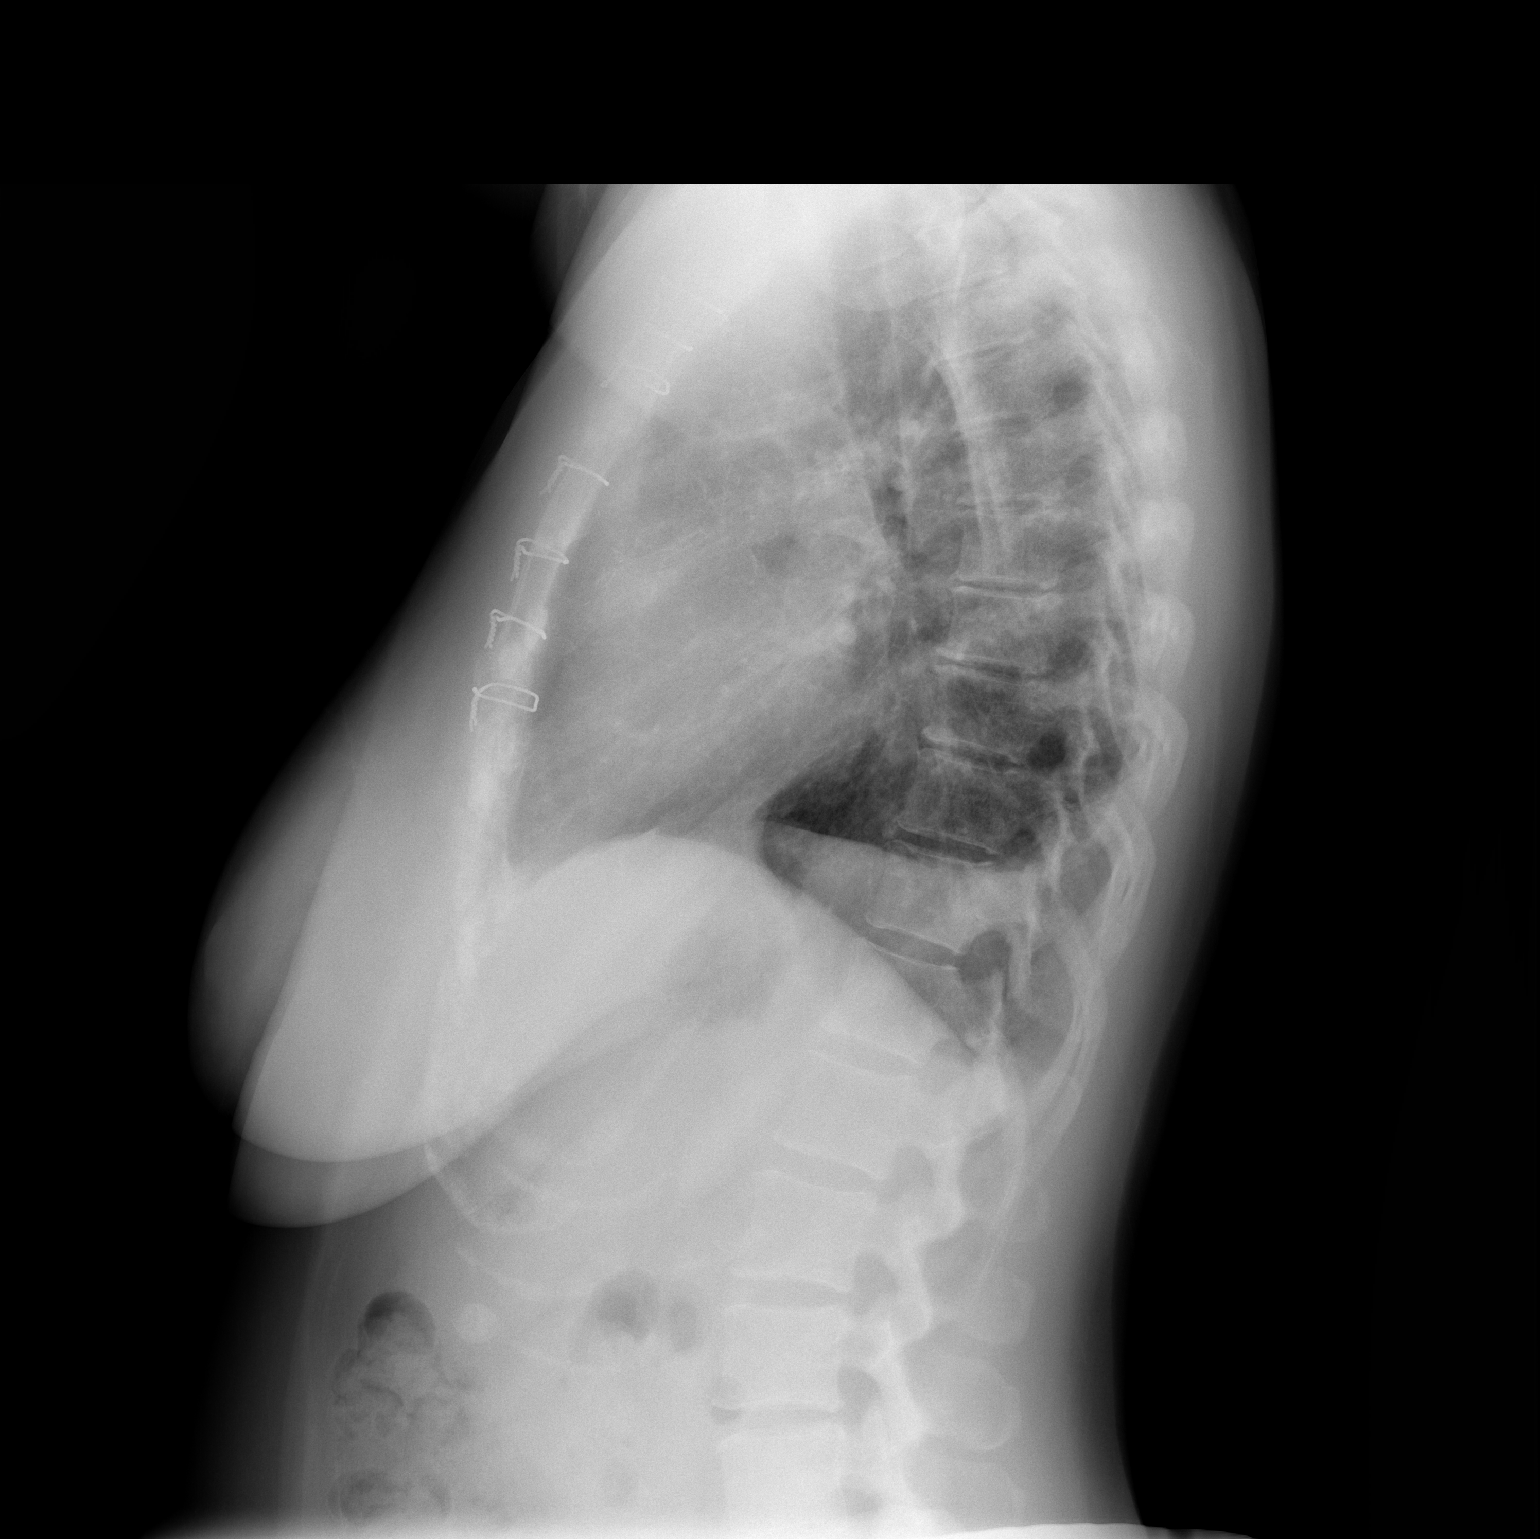

[2 of 2 positions shown; findings below may reference images not displayed]

FINDINGS: The heart is moderately enlarged. Postoperative changes are noted.
Postoperative changes at the left lung base have improved. Right
lung is clear. No pneumothorax. Tiny left pleural effusion cannot be
excluded.
IMPRESSION: Improved postop changes at the left base. Tiny left pleural effusion
cannot be excluded.

Cardiomegaly without decompensation.

## 2017-05-05 ENCOUNTER — Encounter (HOSPITAL_COMMUNITY): Payer: Federal, State, Local not specified - PPO

## 2017-05-06 ENCOUNTER — Encounter (HOSPITAL_COMMUNITY): Payer: Self-pay | Admitting: Cardiology

## 2017-05-06 ENCOUNTER — Ambulatory Visit (HOSPITAL_COMMUNITY): Payer: Federal, State, Local not specified - PPO | Attending: Cardiology

## 2017-05-06 ENCOUNTER — Other Ambulatory Visit: Payer: Self-pay

## 2017-05-06 DIAGNOSIS — E785 Hyperlipidemia, unspecified: Secondary | ICD-10-CM | POA: Insufficient documentation

## 2017-05-06 DIAGNOSIS — I519 Heart disease, unspecified: Secondary | ICD-10-CM | POA: Insufficient documentation

## 2017-05-06 DIAGNOSIS — I252 Old myocardial infarction: Secondary | ICD-10-CM | POA: Diagnosis not present

## 2017-05-06 DIAGNOSIS — I255 Ischemic cardiomyopathy: Secondary | ICD-10-CM | POA: Diagnosis not present

## 2017-05-06 DIAGNOSIS — Z951 Presence of aortocoronary bypass graft: Secondary | ICD-10-CM | POA: Diagnosis not present

## 2017-05-06 DIAGNOSIS — I2510 Atherosclerotic heart disease of native coronary artery without angina pectoris: Secondary | ICD-10-CM | POA: Insufficient documentation

## 2017-05-06 DIAGNOSIS — I272 Pulmonary hypertension, unspecified: Secondary | ICD-10-CM | POA: Insufficient documentation

## 2017-05-06 MED ORDER — PERFLUTREN LIPID MICROSPHERE
1.0000 mL | INTRAVENOUS | Status: AC | PRN
  Administered 2017-05-06: 2 mL via INTRAVENOUS

## 2017-05-06 NOTE — Progress Notes (Signed)
Patient stated she was not comfortable with where my hand was to acquire images, I apologized and told the patient that I could get a female tech to finish her test for her if that would make her more comfortable. Explained situation to [place] and asked if she could take over and finish the exam.

## 2017-05-07 ENCOUNTER — Encounter (HOSPITAL_COMMUNITY): Payer: Federal, State, Local not specified - PPO

## 2017-05-12 ENCOUNTER — Encounter (HOSPITAL_COMMUNITY)
Admission: RE | Admit: 2017-05-12 | Discharge: 2017-05-12 | Disposition: A | Discharge status: Home / Self Care | Payer: Federal, State, Local not specified - PPO | Source: Ambulatory Visit | Attending: Cardiovascular Disease | Admitting: Cardiovascular Disease

## 2017-05-13 ENCOUNTER — Encounter (HOSPITAL_COMMUNITY)
Admission: RE | Admit: 2017-05-13 | Discharge: 2017-05-13 | Disposition: A | Discharge status: Home / Self Care | Payer: Self-pay | Source: Ambulatory Visit | Attending: Cardiovascular Disease | Admitting: Cardiovascular Disease

## 2017-05-14 ENCOUNTER — Encounter (HOSPITAL_COMMUNITY): Payer: Federal, State, Local not specified - PPO

## 2017-05-21 ENCOUNTER — Encounter (HOSPITAL_COMMUNITY): Payer: Federal, State, Local not specified - PPO

## 2017-05-28 ENCOUNTER — Encounter (HOSPITAL_COMMUNITY): Payer: Self-pay

## 2017-05-28 DIAGNOSIS — Z951 Presence of aortocoronary bypass graft: Secondary | ICD-10-CM | POA: Diagnosis not present

## 2017-05-28 DIAGNOSIS — I214 Non-ST elevation (NSTEMI) myocardial infarction: Secondary | ICD-10-CM | POA: Diagnosis not present

## 2017-06-02 ENCOUNTER — Encounter (HOSPITAL_COMMUNITY): Payer: Self-pay

## 2017-06-04 ENCOUNTER — Encounter (HOSPITAL_COMMUNITY): Payer: Self-pay

## 2017-06-09 ENCOUNTER — Other Ambulatory Visit (HOSPITAL_COMMUNITY): Payer: Federal, State, Local not specified - PPO

## 2017-06-09 ENCOUNTER — Encounter (HOSPITAL_COMMUNITY): Payer: Self-pay

## 2017-06-11 ENCOUNTER — Encounter (HOSPITAL_COMMUNITY): Payer: Self-pay

## 2017-06-15 DIAGNOSIS — E785 Hyperlipidemia, unspecified: Secondary | ICD-10-CM | POA: Diagnosis not present

## 2017-06-16 ENCOUNTER — Encounter (HOSPITAL_COMMUNITY): Payer: Self-pay

## 2017-06-16 LAB — LIPID PANEL
CHOLESTEROL TOTAL: 305 mg/dL — AB (ref 100–199)
Chol/HDL Ratio: 6.5 ratio — ABNORMAL HIGH (ref 0.0–4.4)
HDL: 47 mg/dL (ref >39)
LDL CALC: 218 mg/dL — AB (ref 0–99)
TRIGLYCERIDES: 202 mg/dL — AB (ref 0–149)
VLDL CHOLESTEROL CAL: 40 mg/dL (ref 5–40)

## 2017-06-16 LAB — HEPATIC FUNCTION PANEL
ALBUMIN: 4.9 g/dL (ref 3.5–5.5)
ALT: 32 IU/L (ref 0–32)
AST: 23 IU/L (ref 0–40)
Alkaline Phosphatase: 56 IU/L (ref 39–117)
Bilirubin Total: 0.4 mg/dL (ref 0.0–1.2)
Bilirubin, Direct: 0.11 mg/dL (ref 0.00–0.40)
Total Protein: 7.6 g/dL (ref 6.0–8.5)

## 2017-06-18 ENCOUNTER — Encounter (HOSPITAL_COMMUNITY): Payer: Self-pay

## 2017-06-18 ENCOUNTER — Ambulatory Visit: Payer: Federal, State, Local not specified - PPO | Admitting: Genetic Counselor

## 2017-06-23 ENCOUNTER — Encounter (HOSPITAL_COMMUNITY)
Admission: RE | Admit: 2017-06-23 | Discharge: 2017-06-23 | Disposition: A | Discharge status: Home / Self Care | Payer: Self-pay | Source: Ambulatory Visit | Attending: Cardiovascular Disease | Admitting: Cardiovascular Disease

## 2017-06-24 NOTE — Progress Notes (Signed)
Pre-test GC notes  [name] was referred for genetic consult of familial hypercholesterolemia (FH). We walked through the risk factors that can lead to hypercholesterolemia and discussed the characteristic features of a genetic condition, namely absence of risk factors, early age of presentation, increased disease severity and family history of the condition. The clinical manifestations of FH were reviewed. We went through the molecular pathogenesis of FH and I informed her that [place] is primarily caused by pathogenic variants in three genes, namely APOB, LDLR and PCSK9. These pathogenic variants impact LDLR synthesis, degradation and recycling in cells. We then walked through autosomal dominant inheritance pattern and viewed pedigree of families with heterozygous FH (HeFH) and homozygous FH (HoFH). I informed her that digenic or compound heterozygous mutations in APOB, LDLR and PCSK9 genes can cause HoFH.   We reviewed the likely outcomes of FH genetic testing. Based on the diagnostic criteria for FH, yields can range from 50%-90%. A positive yield is observed in  ~63% of patients with a definite clinical diagnosis of FH. I also made clear to her that a negative test does not exclude a genetic basis for FH. Limitations in current genetic testing methodology can produce a negative result. Variants of unknown significance (VUS) can be seen in some cases. I explained to her that typically a VUS is so classified if the variant is not well understood as very few individuals have been reported to harbor this variant or its role in gene function has not been elucidated. Screening other first-degree family members by genetic testing was also discussed. Additionally, we briefly touched upon the molecular basis of the different treatment modalities that are currently available.  Her medical and 3-generation family history was obtained. See details below-  [name] (II.1 on pedigree) is a  pleasant 47 year old lady of Hispanic descent who works as a Dance movement psychotherapist at [employer]. She states that she can handle the physical demands of her job.   She informs me that she was given a confirmed diagnosis of FH at age 18 by her uncle who is a physician when he ran several tests prior to her coming here for further studies from [place]. 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 She took off her shoes to show me the bulges on her feet and soles. She reports that she has had these since her childhood.  She reports that she has always had high LDL levels and was on Lipitor. However, she began experiencing severe muscle pain and discontinued Lipitor. She states that she was not taking any medication for her hyperlipidemia for nearly year and had a heart attack. She underwent bypass surgery when she was found to have 95% blockage in a coronary artery.   She states that she began taking Repatha and it did not seme to work for her. She states that she was switched to another medication about 3 months ago that makes her very fatigued. She informs me that she is currently not taking any medication for her hyperlipidemia.  Family history [name] (II.1) is adopted. She has no information about her biological family. She has a 74 year-old daughter who is majoring in hearing and speech therapy at [employer]. Her daughter has not yet had a lipid profile screening done.    Impression and Plans  In summary, [name]'s clinical presentation and early age of diagnosis in  the absence of known risk factors is indicative of a genetic condition. Since she is adopted she is unaware of her family history for FH.   Genetic testing for FH is highly recommended. The genetic test should include APOB, LDLR and PCSK9 genes. Identifying the causative gene variant will help in the targeted screening of  her daughter who is at a 50% risk of inheriting her condition. More importantly, the genetic test result will help direct appropriate treatment strategies to reduce her cholesterol levels and risk of an adverse event.  We also reviewed insurance coverage for genetic testing. Most insurance providers mandate prior authorization for genetic testing. If this is a requirement, the laboratory can aid her in this process.   In addition, we discussed the protections afforded by the Genetic Information Non-Discrimination Act (GINA). I explained to her that GINA protects her from losing employment or health insurance based on her genotype. However, these protections do not cover life insurance and disability. She verbalized understanding of this.  Plan [name] is interested in genetic testing for FH. Blood was drawn today and sent out for testing.                                                                                                                                                                                                                                                             Lattie Corns, Ph.D, [facility] Clinical Molecular Geneticist

## 2017-06-25 ENCOUNTER — Encounter (HOSPITAL_COMMUNITY): Payer: Self-pay

## 2017-06-30 ENCOUNTER — Encounter (HOSPITAL_COMMUNITY)
Admission: RE | Admit: 2017-06-30 | Discharge: 2017-06-30 | Disposition: A | Discharge status: Home / Self Care | Payer: Self-pay | Source: Ambulatory Visit | Attending: Cardiovascular Disease | Admitting: Cardiovascular Disease

## 2017-06-30 DIAGNOSIS — Z951 Presence of aortocoronary bypass graft: Secondary | ICD-10-CM | POA: Insufficient documentation

## 2017-06-30 DIAGNOSIS — I214 Non-ST elevation (NSTEMI) myocardial infarction: Secondary | ICD-10-CM | POA: Insufficient documentation

## 2017-07-01 ENCOUNTER — Encounter (HOSPITAL_COMMUNITY)
Admission: RE | Admit: 2017-07-01 | Discharge: 2017-07-01 | Disposition: A | Discharge status: Home / Self Care | Payer: Self-pay | Source: Ambulatory Visit | Attending: Cardiovascular Disease | Admitting: Cardiovascular Disease

## 2017-07-02 ENCOUNTER — Encounter (HOSPITAL_COMMUNITY)
Admission: RE | Admit: 2017-07-02 | Discharge: 2017-07-02 | Disposition: A | Discharge status: Home / Self Care | Payer: Self-pay | Source: Ambulatory Visit | Attending: Cardiovascular Disease | Admitting: Cardiovascular Disease

## 2017-07-02 DIAGNOSIS — F4310 Post-traumatic stress disorder, unspecified: Secondary | ICD-10-CM | POA: Diagnosis not present

## 2017-07-07 ENCOUNTER — Encounter (HOSPITAL_COMMUNITY): Payer: Self-pay

## 2017-07-09 ENCOUNTER — Encounter (HOSPITAL_COMMUNITY): Payer: Self-pay

## 2017-07-09 DIAGNOSIS — F4310 Post-traumatic stress disorder, unspecified: Secondary | ICD-10-CM | POA: Diagnosis not present

## 2017-07-14 ENCOUNTER — Encounter (HOSPITAL_COMMUNITY): Payer: Self-pay

## 2017-07-15 ENCOUNTER — Encounter: Payer: Self-pay | Admitting: Cardiovascular Disease

## 2017-07-15 ENCOUNTER — Ambulatory Visit: Payer: Federal, State, Local not specified - PPO | Admitting: Cardiovascular Disease

## 2017-07-15 DIAGNOSIS — Z951 Presence of aortocoronary bypass graft: Secondary | ICD-10-CM | POA: Diagnosis not present

## 2017-07-15 DIAGNOSIS — E7849 Other hyperlipidemia: Secondary | ICD-10-CM

## 2017-07-15 DIAGNOSIS — I255 Ischemic cardiomyopathy: Secondary | ICD-10-CM | POA: Diagnosis not present

## 2017-07-15 MED ORDER — METOPROLOL TARTRATE 25 MG PO TABS: 12.5000 mg | ORAL_TABLET | Freq: Two times a day (BID) | ORAL | 6 refills | Status: DC

## 2017-07-15 NOTE — Patient Instructions (Signed)
Medication Instructions: Your physician recommends that you continue on your current medications as directed. Please refer to the Current Medication list given to you today.  START Metoprolol 25 mg--take 1/2 tab (12.5 mg) twice daily.   Follow-Up: You have been referred to the Heart Failure Clinic.  Your physician wants you to follow-up in: 6 months with Dr. [name]. You will receive a reminder letter in the mail two months in advance. If you don't receive a letter, please call our office to schedule the follow-up appointment.  I have sent a message to Dr. Broadus John concerning your labs and if any follow-up is needed. I will give you a call when I hear back from her.   If you need a refill on your cardiac medications before your next appointment, please call your pharmacy.

## 2017-07-15 NOTE — Assessment & Plan Note (Signed)
History of non-STEMI with emergent cardiac catheterization performed by Dr. Cyndia Bent on 07/12/16 with a LIMA to the LAD for a 90% ostial LAD lesion with an anteroapical wall motion abnormality. Recent 2-D echo performed 05/06/17 revealed EF in the 40-45% range with anteroseptal and apical hypokinesia similar to her current echo performed 7 months previously.

## 2017-07-15 NOTE — Assessment & Plan Note (Signed)
Ischemic cardiomyopathy with EF in the 40-45% range. She does have low output symptoms with dyspnea on exertion. She was intolerant to carvedilol. I am going to begin her on low-dose metoprolol and refer her to the heart failure clinic for optimization of medical therapy and initiation of Entresto. Marland Kitchen

## 2017-07-15 NOTE — Progress Notes (Signed)
07/15/2017 [name]   May 20, 1971  [id number]  Primary Physician Lawerance Cruel, MD Primary Cardiologist: Lorretta Harp MD Lupe Carney, [place]  HPI:  [name] is a 47 y.o. mildly overweight married Caucasian female mother of one 63 year old daughter. I last saw her in the office 03/17/17 .She underwentcoronary artery bypass grafting times one emergently by Dr. Cyndia Bent on 07/12/16. The remainder of her coronary arteries were unremarkable. She did have a non-STEMI on that day with progressively rising troponins and EKG changes. She had a 99% ostial LAD with anteroapical wall motion abnormality. She is recuperating nicely. She is about to start cardiac rehabilitation.She denies chest pain or shortness of breath. She does complain of some cramping in her legs which may be statin-related. Her last echo performed 10/21/16 revealed ejection fraction of 40-45% with wall motion and a all motion abnormality in the LAD distribution. She was begun on carvedilol which apparently she did not tolerate. She was also placed on Repatha which did not affect her lipid profile as well as Praluent. She was referred to Dr. Hessie Knows for genetic testing for presumed FH. Repeat 2-D echo performed 05/06/17 revealed an EF of 40-45% with anteroapical hypokinesia. She does have symptoms of heart failure.   Current Meds  Medication Sig  . aspirin EC 325 MG EC tablet Take 1 tablet (325 mg total) by mouth daily.  . Coenzyme Q10 (COQ10) 200 MG CAPS Take by mouth daily.     Allergies  Allergen Reactions  . Statins Other (See Comments)    Myalgias - rousuvastatin, atorvastatin high dose  . Bactrim Itching and Rash         Social History   Socioeconomic History  . Marital status: Married    Spouse name: Not on file  . Number of children: Not on file  . Years of education: Not on file  . Highest education level: Not on file  Social Needs  . Financial resource strain: Not on file  . Food  insecurity - worry: Not on file  . Food insecurity - inability: Not on file  . Transportation needs - medical: Not on file  . Transportation needs - non-medical: Not on file  Occupational History  . Not on file  Tobacco Use  . Smoking status: Never Smoker  . Smokeless tobacco: Never Used  Substance and Sexual Activity  . Alcohol use: No  . Drug use: No  . Sexual activity: Yes    Birth control/protection: None  Other Topics Concern  . Not on file  Social History Narrative  . Not on file     Review of Systems: General: negative for chills, fever, night sweats or weight changes.  Cardiovascular: negative for chest pain, dyspnea on exertion, edema, orthopnea, palpitations, paroxysmal nocturnal dyspnea or shortness of breath Dermatological: negative for rash Respiratory: negative for cough or wheezing Urologic: negative for hematuria Abdominal: negative for nausea, vomiting, diarrhea, bright red blood per rectum, melena, or hematemesis Neurologic: negative for visual changes, syncope, or dizziness All other systems reviewed and are otherwise negative except as noted above.    Blood pressure (!) 146/95, pulse 80, height 4\' 11"  (1.499 m), weight 138 lb (62.6 kg), last menstrual period 08/10/2011.  General appearance: alert and no distress Neck: no adenopathy, no carotid bruit, no JVD, supple, symmetrical, trachea midline and thyroid not enlarged, symmetric, no tenderness/mass/nodules Lungs: clear to auscultation bilaterally Heart: regular rate and rhythm, S1, S2 normal, no murmur, click, rub or gallop Extremities:  extremities normal, atraumatic, no cyanosis or edema Pulses: 2+ and symmetric Skin: Skin color, texture, turgor normal. No rashes or lesions Neurologic: Alert and oriented X 3, normal strength and tone. Normal symmetric reflexes. Normal coordination and gait  EKG not performed today  ASSESSMENT AND PLAN:   S/P CABG x 1 History of non-STEMI with emergent cardiac  catheterization performed by Dr. Cyndia Bent on 07/12/16 with a LIMA to the LAD for a 90% ostial LAD lesion with an anteroapical wall motion abnormality. Recent 2-D echo performed 05/06/17 revealed EF in the 40-45% range with anteroseptal and apical hypokinesia similar to her current echo performed 7 months previously.  Familial hyperlipidemia History of familial hyperlipidemia intolerant to statin therapy and resistant to PCSK9 therapy. She is seeing Dr. Hessie Knows for genetic testing for FH.her most recent lipid profile performed 06/15/17 revealed an LDL of 218.   Ischemic cardiomyopathy Ischemic cardiomyopathy with EF in the 40-45% range. She does have low output symptoms with dyspnea on exertion. She was intolerant to carvedilol. I am going to begin her on low-dose metoprolol and refer her to the heart failure clinic for optimization of medical therapy and initiation of Entresto. Lorretta Harp MD FACP,FACC,FAHA, [facility] 07/15/2017 11:10 AM

## 2017-07-15 NOTE — Assessment & Plan Note (Signed)
History of familial hyperlipidemia intolerant to statin therapy and resistant to PCSK9 therapy. She is seeing Dr. Hessie Knows for genetic testing for FH.her most recent lipid profile performed 06/15/17 revealed an LDL of 218.

## 2017-07-16 ENCOUNTER — Telehealth (HOSPITAL_COMMUNITY): Payer: Self-pay | Admitting: Internal Medicine

## 2017-07-16 ENCOUNTER — Encounter (HOSPITAL_COMMUNITY)
Admission: RE | Admit: 2017-07-16 | Discharge: 2017-07-16 | Disposition: A | Discharge status: Home / Self Care | Payer: Self-pay | Source: Ambulatory Visit | Attending: Cardiovascular Disease | Admitting: Cardiovascular Disease

## 2017-07-16 NOTE — Telephone Encounter (Signed)
Called patient and left VM for patient to call back.  Need to give patient New CHF appt with Dr. [name].

## 2017-07-21 ENCOUNTER — Encounter (HOSPITAL_COMMUNITY): Payer: Self-pay

## 2017-07-21 DIAGNOSIS — F4310 Post-traumatic stress disorder, unspecified: Secondary | ICD-10-CM | POA: Diagnosis not present

## 2017-07-23 ENCOUNTER — Encounter (HOSPITAL_COMMUNITY): Payer: Self-pay

## 2017-07-24 ENCOUNTER — Encounter: Payer: Self-pay | Admitting: Cardiovascular Disease

## 2017-07-27 DIAGNOSIS — Z951 Presence of aortocoronary bypass graft: Secondary | ICD-10-CM | POA: Diagnosis not present

## 2017-07-27 DIAGNOSIS — E7849 Other hyperlipidemia: Secondary | ICD-10-CM | POA: Diagnosis not present

## 2017-07-27 DIAGNOSIS — I2510 Atherosclerotic heart disease of native coronary artery without angina pectoris: Secondary | ICD-10-CM | POA: Diagnosis not present

## 2017-07-28 ENCOUNTER — Encounter (HOSPITAL_COMMUNITY)
Admission: RE | Admit: 2017-07-28 | Discharge: 2017-07-28 | Disposition: A | Discharge status: Home / Self Care | Payer: Self-pay | Source: Ambulatory Visit | Attending: Cardiovascular Disease | Admitting: Cardiovascular Disease

## 2017-07-28 DIAGNOSIS — Z951 Presence of aortocoronary bypass graft: Secondary | ICD-10-CM | POA: Insufficient documentation

## 2017-07-28 DIAGNOSIS — I214 Non-ST elevation (NSTEMI) myocardial infarction: Secondary | ICD-10-CM | POA: Insufficient documentation

## 2017-07-29 DIAGNOSIS — F4310 Post-traumatic stress disorder, unspecified: Secondary | ICD-10-CM | POA: Diagnosis not present

## 2017-07-30 ENCOUNTER — Encounter (HOSPITAL_COMMUNITY)
Admission: RE | Admit: 2017-07-30 | Discharge: 2017-07-30 | Disposition: A | Discharge status: Home / Self Care | Payer: Self-pay | Source: Ambulatory Visit | Attending: Cardiovascular Disease | Admitting: Cardiovascular Disease

## 2017-08-03 DIAGNOSIS — E7849 Other hyperlipidemia: Secondary | ICD-10-CM | POA: Diagnosis not present

## 2017-08-03 DIAGNOSIS — I2510 Atherosclerotic heart disease of native coronary artery without angina pectoris: Secondary | ICD-10-CM | POA: Diagnosis not present

## 2017-08-03 DIAGNOSIS — Z951 Presence of aortocoronary bypass graft: Secondary | ICD-10-CM | POA: Diagnosis not present

## 2017-08-04 ENCOUNTER — Encounter (HOSPITAL_COMMUNITY): Payer: Self-pay

## 2017-08-05 ENCOUNTER — Encounter (HOSPITAL_COMMUNITY)
Admission: RE | Admit: 2017-08-05 | Discharge: 2017-08-05 | Disposition: A | Discharge status: Home / Self Care | Payer: Self-pay | Source: Ambulatory Visit | Attending: Internal Medicine | Admitting: Internal Medicine

## 2017-08-06 ENCOUNTER — Ambulatory Visit (HOSPITAL_COMMUNITY)
Admission: RE | Admit: 2017-08-06 | Discharge: 2017-08-06 | Disposition: A | Discharge status: Home / Self Care | Payer: Federal, State, Local not specified - PPO | Source: Ambulatory Visit | Attending: Internal Medicine | Admitting: Internal Medicine

## 2017-08-06 ENCOUNTER — Encounter (HOSPITAL_COMMUNITY): Payer: Self-pay | Admitting: Internal Medicine

## 2017-08-06 ENCOUNTER — Encounter (HOSPITAL_COMMUNITY): Payer: Self-pay

## 2017-08-06 VITALS — BP 144/76 | HR 63 | Ht 59.0 in | Wt 138.0 lb

## 2017-08-06 DIAGNOSIS — I110 Hypertensive heart disease with heart failure: Secondary | ICD-10-CM | POA: Diagnosis not present

## 2017-08-06 DIAGNOSIS — Z951 Presence of aortocoronary bypass graft: Secondary | ICD-10-CM | POA: Diagnosis not present

## 2017-08-06 DIAGNOSIS — Z888 Allergy status to other drugs, medicaments and biological substances status: Secondary | ICD-10-CM | POA: Diagnosis not present

## 2017-08-06 DIAGNOSIS — I5020 Unspecified systolic (congestive) heart failure: Secondary | ICD-10-CM | POA: Diagnosis not present

## 2017-08-06 DIAGNOSIS — I5022 Chronic systolic (congestive) heart failure: Secondary | ICD-10-CM

## 2017-08-06 DIAGNOSIS — Z79899 Other long term (current) drug therapy: Secondary | ICD-10-CM | POA: Insufficient documentation

## 2017-08-06 DIAGNOSIS — E785 Hyperlipidemia, unspecified: Secondary | ICD-10-CM | POA: Diagnosis not present

## 2017-08-06 DIAGNOSIS — I2510 Atherosclerotic heart disease of native coronary artery without angina pectoris: Secondary | ICD-10-CM | POA: Insufficient documentation

## 2017-08-06 DIAGNOSIS — Z7982 Long term (current) use of aspirin: Secondary | ICD-10-CM | POA: Insufficient documentation

## 2017-08-06 DIAGNOSIS — Z882 Allergy status to sulfonamides status: Secondary | ICD-10-CM | POA: Diagnosis not present

## 2017-08-06 MED ORDER — SPIRONOLACTONE 25 MG PO TABS: 12.5000 mg | ORAL_TABLET | Freq: Every day | ORAL | 3 refills | Status: DC

## 2017-08-06 MED ORDER — PRAVASTATIN SODIUM 10 MG PO TABS: 10.0000 mg | ORAL_TABLET | Freq: Every day | ORAL | 6 refills | Status: DC

## 2017-08-06 NOTE — Patient Instructions (Signed)
Start Spironolactone 12.5 mg (1/2 tab) daily  Start Pravastatin 10 mg daily in 10 days (08/16/17)  Your physician recommends that you schedule a follow-up appointment in: [place], Pharm D every 2-3 week for 3 visits  Your physician recommends that you schedule a follow-up appointment in: 2-3 months with Dr [name]

## 2017-08-06 NOTE — Progress Notes (Signed)
Advanced Heart Failure Clinic Note   Referring Physician: [name] PCP: Lawerance Cruel, MD PCP-Cardiologist: No primary care provider on file.   HPI: Margurette Brener is a 47 y.o. female with a history of CAD s/p CABG x1 (06/2016), familial hyperlipidemia, and systolic HF due to ischemic cardiomyopathy.  She presents as a new patient referred from Dr. [name] for HF evaluation and  medication optimization. Overall, she is feeling okay. She is going to cardiac rehab once/week. Says she gets SOB and fatigued with steps, working out at cardiac rehab, and minimal walking. Feels that fatigue is getting worse. She has noticed swelling in her fingers, but not in BLE. Sleeps with 2 pillows, no PND. She gets dizzy after walking and stairs, but rarely with sitting to standing. She has upper back pain and axillary pain that is positional, but similar to her pain before CABG. Dr. [name] is aware of this pain. Compliant with medications. No BP checks. Checks HR - usually in 80s. Weights 60-62 kg at cardiac rehab. Said she was diagnosed with depression previously but denies any now. Sees a counselor as needed. No significant snoring.   She works full time as a Freight forwarder at [employer]. No tobacco, occasional beer, drug use. She is adopted.   Echo 04/2017: - Left ventricle: The cavity size was normal. Wall thickness was   normal. Systolic function was mildly to moderately reduced. The   estimated ejection fraction was in the range of 40% to 45%. There   is hypokinesis of the mid-apicalanteroseptal myocardium. There is   akinesis of the apical myocardium. Features are consistent with a   pseudonormal left ventricular filling pattern, with concomitant   abnormal relaxation and increased filling pressure (grade 2   diastolic dysfunction). - Mitral valve: There was mild regurgitation. - Pulmonary arteries: Systolic pressure was mildly increased. PA   peak pressure: 34 mm Hg (S).  Review of Systems: [y] = yes, [ ]  = no    General: Weight gain [ ] ; Weight loss [ ] ; Anorexia [ ] ; Fatigue Blue.Reese ]; Fever [ ] ; Chills [ ] ; Weakness Blue.Reese ]  Cardiac: Chest pain/pressure Blue.Reese ]; Resting SOB [ ] ; Exertional SOB Blue.Reese ]; Orthopnea [ ] ; Pedal Edema [ ] ; Palpitations [ ] ; Syncope [ ] ; Presyncope [ ] ; Paroxysmal nocturnal dyspnea[ ]   Pulmonary: Cough [ ] ; Wheezing[ ] ; Hemoptysis[ ] ; Sputum [ ] ; Snoring [ ]   GI: Vomiting[ ] ; Dysphagia[ ] ; Melena[ ] ; Hematochezia [ ] ; Heartburn[ ] ; Abdominal pain [ ] ; Constipation [ ] ; Diarrhea [ ] ; BRBPR [ ]   GU: Hematuria[ ] ; Dysuria [ ] ; Nocturia[ ]   Vascular: Pain in legs with walking [ ] ; Pain in feet with lying flat [ ] ; Non-healing sores [ ] ; Stroke [ ] ; TIA [ ] ; Slurred speech [ ] ;  Neuro: Headaches[ ] ; Vertigo[ ] ; Seizures[ ] ; Paresthesias[y ];Blurred vision [ ] ; Diplopia [ ] ; Vision changes [ ]   Ortho/Skin: Arthritis [ ] ; Joint pain [ ] ; Muscle pain [ ] ; Joint swelling [ ] ; Back Pain [ ] ; Rash [ ]   Psych: Depression[ ] ; Anxiety[ ]   Heme: Bleeding problems [ ] ; Clotting disorders [ ] ; Anemia [ ]   Endocrine: Diabetes [ ] ; Thyroid dysfunction[ ]    Past Medical History:  Diagnosis Date  . Anemia    due to heavy periods  . Anxiety   . Coronary artery disease   . Difficult intubation 01/22/2016  . Headache   . Heart murmur    aschild antibiotics if having Dental work  . Hypocholesteremia  medical treatment  . Ischemic cardiomyopathy   . Menorrhagia   . NSTEMI (non-ST elevated myocardial infarction) ([place])   . Pelvic pain     Current Outpatient Medications  Medication Sig Dispense Refill  . aspirin EC 325 MG EC tablet Take 1 tablet (325 mg total) by mouth daily. 30 tablet 0  . Coenzyme Q10 (COQ10) 200 MG CAPS Take by mouth daily.    . metoprolol tartrate (LOPRESSOR) 25 MG tablet Take 0.5 tablets (12.5 mg total) by mouth 2 (two) times daily. 60 tablet 6  . [START ON 08/16/2017] pravastatin (PRAVACHOL) 10 MG tablet Take 1 tablet (10 mg total) by mouth daily. 30 tablet 6  .  spironolactone (ALDACTONE) 25 MG tablet Take 0.5 tablets (12.5 mg total) by mouth daily. 15 tablet 3   No current facility-administered medications for this encounter.     Allergies  Allergen Reactions  . Statins Other (See Comments)    Myalgias - rousuvastatin, atorvastatin high dose  . Bactrim Itching and Rash           Social History   Socioeconomic History  . Marital status: Married    Spouse name: Not on file  . Number of children: Not on file  . Years of education: Not on file  . Highest education level: Not on file  Social Needs  . Financial resource strain: Not on file  . Food insecurity - worry: Not on file  . Food insecurity - inability: Not on file  . Transportation needs - medical: Not on file  . Transportation needs - non-medical: Not on file  Occupational History  . Not on file  Tobacco Use  . Smoking status: Never Smoker  . Smokeless tobacco: Never Used  Substance and Sexual Activity  . Alcohol use: No  . Drug use: No  . Sexual activity: Yes    Birth control/protection: None  Other Topics Concern  . Not on file  Social History Narrative  . Not on file      Family History  Adopted: Yes    Vitals:   08/06/17 0943  BP: (!) 144/76  Pulse: 63  SpO2: 100%  Weight: 138 lb (62.6 kg)  Height: 4\' 11"  (1.499 m)     PHYSICAL EXAM: General:  Well appearing. No respiratory difficulty HEENT: normal Neck: supple. JVP 8-10 Carotids 2+ bilat; no bruits. No lymphadenopathy or thyromegaly appreciated. Cor: PMI nondisplaced. Regular rate & rhythm. 2/6 murmur LUSB Lungs: clear Abdomen: soft, nontender, nondistended. No hepatosplenomegaly. No bruits or masses. Good bowel sounds. Extremities: no cyanosis, clubbing, rash. Trace edema Neuro: alert & oriented x 3, cranial nerves grossly intact. moves all 4 extremities w/o difficulty. Affect pleasant but tearful at times.    ASSESSMENT & PLAN:  1. Systolic Heart Failure due to ICM. Echo 04/2017: EF 40-45%  hypokinesis of the mid-apicalanteroseptal myocardium. - NYHA class II-III. Volume status mildly elevated.  - Start spiro 12.5 mg daily - Consider losartan at next visit. She does not quality for entresto with EF >40%  2. CAD s/p CABG x1 (06/2016) - No CP. Follows with Dr [name] last month. - Continue ASA 325 mg daily - Continue lopressor 12.5 BID  3. Hyperlipidemia - Intolerant to atorvastatin, crestor, and zetia with muscle pains. Has tried PCSK9 with minimal result.  - Try pravastatin 10 mg daily. Start 10 days after spiro in case she has side effects  4. HTN - Continue lopressor 12.5 BID - Start spiro as above  Spiro 12.5 mg daily Pravastatin  10 mg daily F/u with Doroteo Bradford PharmD   Georgiana Shore, NP 08/06/17  Patient seen and examined with the above-signed Advanced Practice Provider and/or Housestaff. I personally reviewed laboratory data, imaging studies and relevant notes. I independently examined the patient and formulated the important aspects of the plan. I have edited the note to reflect any of my changes or salient points. I have personally discussed the plan with the patient and/or family.  47 y/o with ischemic CM and EF 40-45% in setting of familial HL. Has multiple complaints but overall doing fairly well. Main issues is fatigue. Struggling to tolerate low-dose b-blocker. Volume status perhaps mildly elevated. No ischemic symptoms. No s3 on exam. We discussed depression as being a possible factor but she denies. Will start spiro 12.5. Continue low-dose b-blocker as toelrated. Refer to PharmD to titrate meds and consider low-dose losartan at next visit. Encouraged more exercise. Will add low-dose pravastatin to see if she can tolerate (failed other statins, zetia and PCSK-9) agents.   Glori Bickers, MD  11:11 AM

## 2017-08-11 ENCOUNTER — Encounter (HOSPITAL_COMMUNITY)
Admission: RE | Admit: 2017-08-11 | Discharge: 2017-08-11 | Disposition: A | Discharge status: Home / Self Care | Payer: Self-pay | Source: Ambulatory Visit | Attending: Cardiovascular Disease | Admitting: Cardiovascular Disease

## 2017-08-12 ENCOUNTER — Encounter (HOSPITAL_COMMUNITY)
Admission: RE | Admit: 2017-08-12 | Discharge: 2017-08-12 | Disposition: A | Discharge status: Home / Self Care | Payer: Self-pay | Source: Ambulatory Visit | Attending: Internal Medicine | Admitting: Internal Medicine

## 2017-08-12 DIAGNOSIS — F4310 Post-traumatic stress disorder, unspecified: Secondary | ICD-10-CM | POA: Diagnosis not present

## 2017-08-13 ENCOUNTER — Encounter (HOSPITAL_COMMUNITY)
Admission: RE | Admit: 2017-08-13 | Discharge: 2017-08-13 | Disposition: A | Discharge status: Home / Self Care | Payer: Self-pay | Source: Ambulatory Visit | Attending: Cardiovascular Disease | Admitting: Cardiovascular Disease

## 2017-08-18 ENCOUNTER — Encounter (HOSPITAL_COMMUNITY): Payer: Self-pay

## 2017-08-19 ENCOUNTER — Encounter (HOSPITAL_COMMUNITY)
Admission: RE | Admit: 2017-08-19 | Discharge: 2017-08-19 | Disposition: A | Discharge status: Home / Self Care | Payer: Self-pay | Source: Ambulatory Visit | Attending: Internal Medicine | Admitting: Internal Medicine

## 2017-08-20 ENCOUNTER — Encounter (HOSPITAL_COMMUNITY)
Admission: RE | Admit: 2017-08-20 | Discharge: 2017-08-20 | Disposition: A | Discharge status: Home / Self Care | Payer: Self-pay | Source: Ambulatory Visit | Attending: Cardiovascular Disease | Admitting: Cardiovascular Disease

## 2017-08-20 ENCOUNTER — Ambulatory Visit (HOSPITAL_COMMUNITY)
Admission: RE | Admit: 2017-08-20 | Discharge: 2017-08-20 | Disposition: A | Discharge status: Home / Self Care | Payer: Federal, State, Local not specified - PPO | Source: Ambulatory Visit | Attending: Cardiology | Admitting: Cardiology

## 2017-08-20 VITALS — BP 142/92 | HR 51 | Wt 137.2 lb

## 2017-08-20 DIAGNOSIS — I255 Ischemic cardiomyopathy: Secondary | ICD-10-CM | POA: Diagnosis not present

## 2017-08-20 DIAGNOSIS — E785 Hyperlipidemia, unspecified: Secondary | ICD-10-CM | POA: Diagnosis not present

## 2017-08-20 DIAGNOSIS — Z951 Presence of aortocoronary bypass graft: Secondary | ICD-10-CM | POA: Diagnosis not present

## 2017-08-20 DIAGNOSIS — I2510 Atherosclerotic heart disease of native coronary artery without angina pectoris: Secondary | ICD-10-CM | POA: Diagnosis not present

## 2017-08-20 DIAGNOSIS — I110 Hypertensive heart disease with heart failure: Secondary | ICD-10-CM | POA: Diagnosis not present

## 2017-08-20 DIAGNOSIS — I5022 Chronic systolic (congestive) heart failure: Secondary | ICD-10-CM | POA: Diagnosis not present

## 2017-08-20 LAB — BASIC METABOLIC PANEL
Anion gap: 8 (ref 5–15)
BUN: 19 mg/dL (ref 6–20)
CALCIUM: 9.6 mg/dL (ref 8.9–10.3)
CO2: 24 mmol/L (ref 22–32)
Chloride: 106 mmol/L (ref 101–111)
Creatinine, Ser: 0.66 mg/dL (ref 0.44–1.00)
GFR calc Af Amer: >60 mL/min (ref >60)
Glucose, Bld: 119 mg/dL — ABNORMAL HIGH (ref 65–99)
Potassium: 4.3 mmol/L (ref 3.5–5.1)
Sodium: 138 mmol/L (ref 135–145)

## 2017-08-20 MED ORDER — LOSARTAN POTASSIUM 25 MG PO TABS: 25.0000 mg | ORAL_TABLET | Freq: Every day | ORAL | 3 refills | Status: DC

## 2017-08-20 NOTE — Patient Instructions (Signed)
It was great to meet you today!  Start taking losartan 25mg  (1 tablet) daily.  Continue taking your other medications.  We will call to make any changes based on your blood work if needed.  Follow up with the pharmacist, Doroteo Bradford, on 4/11 at 10am.

## 2017-08-20 NOTE — Progress Notes (Signed)
HF MD: [name]  HPI:  [name] a 47 y.o.femalewith a history of CAD s/p CABG x1(06/2016), familial hyperlipidemia,andsystolic HF due toischemic cardiomyopathy.  Presented as a new patientreferred fromDr. [name] forHF evaluation andmedication optimization on 08/06/17 to Dr. [name]. Overall, she is feeling okay. She is going to cardiac rehab once/week. Says she gets SOB and fatigued with steps, working out at cardiac rehab, and minimal walking.Feels that fatigue is getting worse. She has noticed swelling in her fingers, but not in BLE. Sleeps with 2 pillows, no PND. She gets dizzy after walking and stairs, but rarely with sitting to standing. She has upper back pain and axillary pain that is positional, but similar to her pain before CABG. Dr. [name] is aware of this pain. Compliant with medications. No BP checks. Checks HR - usually in 80s. Weights 60-62 kg at cardiac rehab.Said she was diagnosed with depression previously but denies any now. Sees a counselor as needed. 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 She is adopted.  Struggling to tolerate low dose beta-blocker. On 04/03/17 patient reported SOB, localized chest pain, and general fatigue from recently increased carvedilol 6.25mg  BID, it was stopped and she was switched to Lopressor on 07/15/17 by Dr. [name]. Dr. [name] started Spironolactone 12.5 mg daily on 08/06/17 and pravastatin 10 mg daily was to be started on 08/16/17.  Patient arrives to pharmacy clinic today for HF medication optimization. Going to cardiac rehab 2-3 times per week now.    Headaches or blurred vision? Had migraines prior to MI, but none after. Has started having headaches the past few days.  Shortness of breath/dyspnea on exertion? no   Orthopnea/PND? no  Edema? No, some swelling in her hands, can't wear her wedding band  Lightheadedness/dizziness? no  Daily weights at home?  Yes, a couple of times a week, check at cardiac rehab 2-3 times per week  Blood pressure/heart rate monitoring at home? No, doesn't have a cuff, but they are checking at cardiac rehab. Last Friday HR was 93-98  Following low-sodium/fluid-restricted diet? Yes, eats plain meat mostly  HF Medications: Spironolactone 12.5mg  daily Metoprolol tartrate 12.5mg  BID (HTN)   Has the patient been experiencing any side effects to the medications prescribed?  no  Does the patient have any problems obtaining medications due to transportation or finances?   no  Understanding of regimen: good Understanding of indications: fair Potential of compliance: good Patient understands to avoid NSAIDs. Patient understands to avoid decongestants.  Pertinent Lab Values:  08/20/17: Serum creatinine 0.66, BUN 19, Potassium 4.3, Sodium 138  06/15/17: LFTs WNL  Vital Signs:  Weight: 137.2 lbs (last clinic visit weight: 138 lbs)  Blood pressure: 142/92 mmHg  Heart rate: 51 bpm  08/06/17: BP 144/76, HR 63, Wt 138   Assessment: 1. Chronicsystolic CHF (EF 92-42%), due to ICM. NYHA class II-IIIsymptoms. - Start losartan 25mg  daily (Doesn't qualify for Entresto with LVEF > 40%) - BMET today since spironolactone was started at last visit and last BMET was 08/2016 - Continue spironolactone 12.5mg  daily and lopressor 12.5 mg BID - Basic disease state pathophysiology, medication indication, mechanism and side effects reviewed at length with patient and he verbalized understanding  2. CADs/p CABG x1 (06/2016) - No CP. Follows with Dr [name] last month. - Continue ASA 325 mg daily and Lopressor 12.5 mg BID  3. Hyperlipidemia - Intolerant to atorvastatin, crestor, and zetia with muscle pains. Has tried PCSK9 with minimal result.  -  06/15/17 Lipids: TC 305, TG 202, LDL 218, HDL 47 - Continue pravastatin 10 mg daily and CoQ10  4. HTN - Remains above goal < 130/80 mmHg - Starting losartan 25mg  daily -  Continue Lopressor and spironolactone as above  Plan: 1) Medication changes: Based on clinical presentation, vital signs and recent labs will start losartan 25 mg daily 2) Labs: BMET today and at follow up in 2 weeks 3) Follow-up: Pharmacy clinic 4/11 and Dr. [name] 5/29   [name], PharmD, BCPS, CPP Clinical Pharmacist Pager: [phone] Phone: [phone] 08/19/2017 3:53 PM

## 2017-08-25 ENCOUNTER — Encounter (HOSPITAL_COMMUNITY)
Admission: RE | Admit: 2017-08-25 | Discharge: 2017-08-25 | Disposition: A | Discharge status: Home / Self Care | Payer: Self-pay | Source: Ambulatory Visit | Attending: Cardiovascular Disease | Admitting: Cardiovascular Disease

## 2017-08-25 DIAGNOSIS — I214 Non-ST elevation (NSTEMI) myocardial infarction: Secondary | ICD-10-CM | POA: Insufficient documentation

## 2017-08-25 DIAGNOSIS — Z951 Presence of aortocoronary bypass graft: Secondary | ICD-10-CM | POA: Insufficient documentation

## 2017-08-26 ENCOUNTER — Encounter (HOSPITAL_COMMUNITY)
Admission: RE | Admit: 2017-08-26 | Discharge: 2017-08-26 | Disposition: A | Discharge status: Home / Self Care | Payer: Self-pay | Source: Ambulatory Visit | Attending: Cardiovascular Disease | Admitting: Cardiovascular Disease

## 2017-08-27 ENCOUNTER — Encounter (HOSPITAL_COMMUNITY): Payer: Self-pay

## 2017-08-28 ENCOUNTER — Telehealth: Payer: Self-pay | Admitting: Internal Medicine

## 2017-08-28 NOTE — Telephone Encounter (Signed)
Closed Encounter  °

## 2017-09-01 ENCOUNTER — Encounter (HOSPITAL_COMMUNITY)
Admission: RE | Admit: 2017-09-01 | Discharge: 2017-09-01 | Disposition: A | Discharge status: Home / Self Care | Payer: Self-pay | Source: Ambulatory Visit | Attending: Cardiovascular Disease | Admitting: Cardiovascular Disease

## 2017-09-02 ENCOUNTER — Encounter (HOSPITAL_COMMUNITY)
Admission: RE | Admit: 2017-09-02 | Discharge: 2017-09-02 | Disposition: A | Discharge status: Home / Self Care | Payer: Self-pay | Source: Ambulatory Visit | Attending: Cardiovascular Disease | Admitting: Cardiovascular Disease

## 2017-09-03 ENCOUNTER — Ambulatory Visit (HOSPITAL_COMMUNITY)
Admission: RE | Admit: 2017-09-03 | Discharge: 2017-09-03 | Disposition: A | Discharge status: Home / Self Care | Payer: Federal, State, Local not specified - PPO | Source: Ambulatory Visit | Attending: Internal Medicine | Admitting: Internal Medicine

## 2017-09-03 ENCOUNTER — Encounter (HOSPITAL_COMMUNITY)
Admission: RE | Admit: 2017-09-03 | Discharge: 2017-09-03 | Disposition: A | Discharge status: Home / Self Care | Payer: Self-pay | Source: Ambulatory Visit | Attending: Cardiovascular Disease | Admitting: Cardiovascular Disease

## 2017-09-03 VITALS — BP 136/84 | HR 53 | Wt 136.6 lb

## 2017-09-03 DIAGNOSIS — Z79899 Other long term (current) drug therapy: Secondary | ICD-10-CM | POA: Insufficient documentation

## 2017-09-03 DIAGNOSIS — I2510 Atherosclerotic heart disease of native coronary artery without angina pectoris: Secondary | ICD-10-CM | POA: Diagnosis not present

## 2017-09-03 DIAGNOSIS — I5022 Chronic systolic (congestive) heart failure: Secondary | ICD-10-CM | POA: Diagnosis not present

## 2017-09-03 DIAGNOSIS — E785 Hyperlipidemia, unspecified: Secondary | ICD-10-CM | POA: Diagnosis not present

## 2017-09-03 DIAGNOSIS — I110 Hypertensive heart disease with heart failure: Secondary | ICD-10-CM | POA: Diagnosis not present

## 2017-09-03 DIAGNOSIS — Z7982 Long term (current) use of aspirin: Secondary | ICD-10-CM | POA: Diagnosis not present

## 2017-09-03 DIAGNOSIS — Z951 Presence of aortocoronary bypass graft: Secondary | ICD-10-CM | POA: Insufficient documentation

## 2017-09-03 DIAGNOSIS — I255 Ischemic cardiomyopathy: Secondary | ICD-10-CM | POA: Insufficient documentation

## 2017-09-03 LAB — BASIC METABOLIC PANEL
Anion gap: 11 (ref 5–15)
BUN: 17 mg/dL (ref 6–20)
CALCIUM: 9.7 mg/dL (ref 8.9–10.3)
CO2: 22 mmol/L (ref 22–32)
CREATININE: 0.7 mg/dL (ref 0.44–1.00)
Chloride: 104 mmol/L (ref 101–111)
GFR calc Af Amer: >60 mL/min (ref >60)
GLUCOSE: 117 mg/dL — AB (ref 65–99)
POTASSIUM: 4.2 mmol/L (ref 3.5–5.1)
Sodium: 137 mmol/L (ref 135–145)

## 2017-09-03 MED ORDER — SPIRONOLACTONE 25 MG PO TABS: 25.0000 mg | ORAL_TABLET | Freq: Every day | ORAL | 5 refills | Status: DC

## 2017-09-03 MED ORDER — EZETIMIBE 10 MG PO TABS: 10.0000 mg | ORAL_TABLET | Freq: Every day | ORAL | 5 refills | Status: DC

## 2017-09-03 NOTE — Progress Notes (Signed)
HF MD: [name]  HPI: [name] a 47 y.o.femalewith a history of CAD s/p CABG x1(06/2016), familial hyperlipidemia,andsystolic HF due toischemic cardiomyopathy.  Presented as a new patientreferred fromDr. [name] forHF evaluation andmedication optimizationon 08/06/17 to Dr. [name]. Overall, she is feeling okay. She is going to cardiac rehab once/week. Says she gets SOB and fatigued with steps, working out at cardiac rehab, and minimal walking.Feels that fatigue is getting worse. She has noticed swelling in her fingers, but not in BLE. Sleeps with 2 pillows, no PND. She gets dizzy after walking and stairs, but rarely with sitting to standing. She has upper back pain and axillary pain that is positional, but similar to her pain before CABG. Dr. [name] is aware of this pain. Compliant with medications. No BP checks. Checks HR - usually in 80s. Weights 60-62 kg at cardiac rehab.Said she was diagnosed with depression previously but denies any now. Sees a counselor as needed. 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 She is adopted.  Struggling to tolerate low dose beta-blocker.On11/9/18 patient reported SOB, localized chest pain, and general fatigue from recently increasedcarvedilol 6.25mg  BID, it was stopped and she was switched to Lopressor on 07/15/17 by Dr. [name]. Dr. [name] started Spironolactone 12.5 mg daily on 08/06/17 and pravastatin 10 mg daily was to be started on 08/16/17.  Patient arrives to pharmacy clinic today for HF medication optimization.At last HF clinic pharmacy visit on 3/28, losartan 25 mg daily was started. She has felt well since last visit although she does endorse some myalgias since restarting pravastatin 10 mg daily. She states it has gotten somewhat better over the past week. Still going to cardiac rehab 2-3 times per week now.    Headaches or blurred vision?Had migraines prior to MI,  but none after. No headaches recently   Shortness of breath/dyspnea on exertion?Yes - getting better, mostly exertional  Orthopnea/PND?No  Edema?No  Lightheadedness/dizziness?Yes - had one episode at cardiac rehab last week, can't remember what BP was but was given some Gatorade and it resolved  Daily weights at home?Yes, a couple of times a week, check at cardiac rehab 2-3 times per week  Blood pressure/heart rate monitoring at home?No, doesn't have a cuff, but they are checking at cardiac rehab  Following low-sodium/fluid-restricteddiet? Yes  HF Medications: Spironolactone 12.5mg  daily Metoprolol tartrate 12.5mg  BID (HTN) Losartan 25 mg daily   Has the patient been experiencing any side effects to the medications prescribed?no  Does the patient have any problems obtaining medications due to transportation or finances?no - [employer] commercial   Understanding of regimen:good Understanding of indications:fair Potential of compliance:good Patient understands to avoid NSAIDs. Patient understands to avoid decongestants.  Pertinent Lab Values:  09/03/17: Serum creatinine 0.70, BUN 17, Potassium 4.2, Sodium 137  06/15/17: LFTs WNL  Vital Signs:  Weight:136.6lbs(last clinic visitweight:138lbs)  Blood pressure:136/44mmHg  Heart rate:53bpm  Assessment: 1. Chronicsystolic CHF (ZD66-44%), due toICM. NYHA classII-IIIsymptoms. - Volume status stable  - Increase spironolactone to goal 25 mg daily  - Continue losartan25mg  daily (doesn't qualify for Entresto with LVEF > 40%) - Basic disease state pathophysiology, medication indication, mechanism and side effects reviewed at length with patient and he verbalized understanding  2. CADs/p CABG x1 (06/2016) - No CP. Follows with Dr [name] - Continue ASA 325 mg daily, lopressor 12.5 mgBID, and ARB  3. Hyperlipidemia - Intolerant to atorvastatin and crestor with muscle pains. Has tried PCSK9  with minimal result. - 06/15/17 Lipids: TC  305, TG 202, LDL 218, HDL 47 - Started pravastatin 10 mg daily and CoQ10 2 weeks ago. So far tolerating ok although having some myalgias that have gotten better over time - Will try adding Zetia today to assist with lowering LDL - Will be seeing Dr. [name] for further management in June  4. HTN -Remains above goal < 130/80 mmHg but improved - Increasing spironolactone to 25 mg daily   Plan: 1) Medication changes: Based on clinical presentation, vital signs and recent labs willincrease spironolactone to 25 mg daily  2) Labs:BMET today  3) Follow-up:Pharmacy clinic 4/25 and Dr. [name] 5/29    [name], PharmD, BCPS, CPP Clinical Pharmacist Pager: [phone] Phone: [phone] 3/27/20193:53 PM

## 2017-09-03 NOTE — Patient Instructions (Addendum)
It was great to see you again today!  Please START Zetia (ezetimibe) 10 mg DAILY.   Please INCREASE your spironolactone to 25 mg (1 tablet) DAILY.   Blood work today. We will call you with any changes.   Please keep your appointment with the pharmacist, Doroteo Bradford, on 09/17/17 at 10:00 am.   Please keep your appointment with Dr. [name] on 10/21/17.

## 2017-09-08 ENCOUNTER — Encounter (HOSPITAL_COMMUNITY)
Admission: RE | Admit: 2017-09-08 | Discharge: 2017-09-08 | Disposition: A | Discharge status: Home / Self Care | Payer: Self-pay | Source: Ambulatory Visit | Attending: Cardiovascular Disease | Admitting: Cardiovascular Disease

## 2017-09-09 ENCOUNTER — Encounter (HOSPITAL_COMMUNITY)
Admission: RE | Admit: 2017-09-09 | Discharge: 2017-09-09 | Disposition: A | Discharge status: Home / Self Care | Payer: Self-pay | Source: Ambulatory Visit | Attending: Cardiovascular Disease | Admitting: Cardiovascular Disease

## 2017-09-10 ENCOUNTER — Encounter (HOSPITAL_COMMUNITY)
Admission: RE | Admit: 2017-09-10 | Discharge: 2017-09-10 | Disposition: A | Discharge status: Home / Self Care | Payer: Self-pay | Source: Ambulatory Visit | Attending: Cardiovascular Disease | Admitting: Cardiovascular Disease

## 2017-09-15 ENCOUNTER — Encounter (HOSPITAL_COMMUNITY): Payer: Self-pay

## 2017-09-16 ENCOUNTER — Encounter (HOSPITAL_COMMUNITY)
Admission: RE | Admit: 2017-09-16 | Discharge: 2017-09-16 | Disposition: A | Discharge status: Home / Self Care | Payer: Self-pay | Source: Ambulatory Visit | Attending: Internal Medicine | Admitting: Internal Medicine

## 2017-09-17 ENCOUNTER — Ambulatory Visit (HOSPITAL_COMMUNITY)
Admission: RE | Admit: 2017-09-17 | Discharge: 2017-09-17 | Disposition: A | Discharge status: Home / Self Care | Payer: Federal, State, Local not specified - PPO | Source: Ambulatory Visit | Attending: Internal Medicine | Admitting: Internal Medicine

## 2017-09-17 ENCOUNTER — Encounter (HOSPITAL_COMMUNITY)
Admission: RE | Admit: 2017-09-17 | Discharge: 2017-09-17 | Disposition: A | Discharge status: Home / Self Care | Payer: Self-pay | Source: Ambulatory Visit | Attending: Cardiovascular Disease | Admitting: Cardiovascular Disease

## 2017-09-17 VITALS — BP 119/81 | HR 48 | Wt 137.4 lb

## 2017-09-17 DIAGNOSIS — I5022 Chronic systolic (congestive) heart failure: Secondary | ICD-10-CM | POA: Insufficient documentation

## 2017-09-17 DIAGNOSIS — E785 Hyperlipidemia, unspecified: Secondary | ICD-10-CM | POA: Insufficient documentation

## 2017-09-17 DIAGNOSIS — Z951 Presence of aortocoronary bypass graft: Secondary | ICD-10-CM | POA: Diagnosis not present

## 2017-09-17 DIAGNOSIS — I110 Hypertensive heart disease with heart failure: Secondary | ICD-10-CM | POA: Diagnosis not present

## 2017-09-17 DIAGNOSIS — I255 Ischemic cardiomyopathy: Secondary | ICD-10-CM | POA: Diagnosis not present

## 2017-09-17 DIAGNOSIS — I2510 Atherosclerotic heart disease of native coronary artery without angina pectoris: Secondary | ICD-10-CM | POA: Diagnosis not present

## 2017-09-17 DIAGNOSIS — I5020 Unspecified systolic (congestive) heart failure: Secondary | ICD-10-CM | POA: Diagnosis not present

## 2017-09-17 DIAGNOSIS — Z79899 Other long term (current) drug therapy: Secondary | ICD-10-CM | POA: Insufficient documentation

## 2017-09-17 LAB — BASIC METABOLIC PANEL
ANION GAP: 9 (ref 5–15)
BUN: 15 mg/dL (ref 6–20)
CALCIUM: 9.8 mg/dL (ref 8.9–10.3)
CO2: 24 mmol/L (ref 22–32)
Chloride: 103 mmol/L (ref 101–111)
Creatinine, Ser: 0.73 mg/dL (ref 0.44–1.00)
GFR calc Af Amer: >60 mL/min (ref >60)
GLUCOSE: 132 mg/dL — AB (ref 65–99)
Potassium: 4.6 mmol/L (ref 3.5–5.1)
Sodium: 136 mmol/L (ref 135–145)

## 2017-09-17 MED ORDER — PRAVASTATIN SODIUM 10 MG PO TABS: 10.0000 mg | ORAL_TABLET | ORAL | 6 refills | Status: DC

## 2017-09-17 MED ORDER — METOPROLOL SUCCINATE ER 25 MG PO TB24: 12.5000 mg | ORAL_TABLET | Freq: Every day | ORAL | 5 refills | Status: DC

## 2017-09-17 NOTE — Progress Notes (Signed)
HF MD: [name]  HPI: [name] a 47 y.o.femalewith a history of CAD s/p CABG x1(06/2016), familial hyperlipidemia,andsystolic HF due toischemic cardiomyopathy.  Presented as a new patientreferred fromDr. Gwenlyn [name] forHF evaluation andmedication optimizationon 08/06/17 to Dr. Haroldine [name]. Overall, she is feeling okay. She is going to cardiac rehab once/week. Says she gets SOB and fatigued with steps, working out at cardiac rehab, and minimal walking.Feels that fatigue is getting worse. She has noticed swelling in her fingers, but not in BLE. Sleeps with 2 pillows, no PND. She gets dizzy after walking and stairs, but rarely with sitting to standing. She has upper back pain and axillary pain that is positional, but similar to her pain before CABG. Dr. Gwenlyn [name] is aware of this pain. Compliant with medications. No BP checks. Checks HR - usually in 80s. Weights 60-62 kg at cardiac rehab.Said she was diagnosed with depression previously but denies any now. Sees a counselor as needed. 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 She is adopted.  Struggling to tolerate low dose beta-blocker.On11/9/18 patient reported SOB, localized chest pain, and general fatigue from recently increasedcarvedilol 6.25mg  BID, it was stopped and she was switched to Lopressor on 07/15/17 by Dr. Gwenlyn [name]. Dr. Haroldine [name] started Spironolactone 12.5 mg daily on 08/06/17 and pravastatin 10 mg daily was to be started on 08/16/17.  Presents for pharmacist-led HF medication titration. At last pharmacy visit on 09/03/17, spironolactone was increased to 25mg  daily and ezetimibe was added for additional lipid lowering effect while on low-intensity statin (unable to tolerate higher doses of statin due to myalgia). Today, still reports myalgia with pravastatin 10mg  daily. Also reports lower than normal HR at home with some lightheadedness and dizziness recently. Still going  to cardiac rehab 2-3 times per week.   Shortness of breath/dyspnea on exertion? no   Orthopnea/PND? no  Edema? no  Lightheadedness/dizziness? Yes  Daily weights at home? Yes, ~133 lbs at home  Blood pressure/heart rate monitoring at home? Yes, checks HR with wristwatch, reports lower than normal HR (HR~50) at home  Following low-sodium/fluid-restricted diet? yes  HF Medications: Losartan 25mg  daily Metoprolol tartrate 12.5mg  BID (HTN) Spironolactone 25mg  daily  Has the patient been experiencing any side effects to the medications prescribed?  Yes, myalgia with pravastatin  Does the patient have any problems obtaining medications due to transportation or finances?   No, BCBS commercial  Understanding of regimen: good Understanding of indications: good Potential of compliance: good Patient understands to avoid NSAIDs. Patient understands to avoid decongestants.  Pertinent Lab Values:  09/17/17: Serum creatinine 0.73, BUN 15, Potassium 4.6, Sodium 136  09/03/17:Serum creatinine0.70,BUN 17, Potassium4.2, [id number]  06/15/17: LFTs WNL  Vital Signs:  Weight: 137.41 lbs (last clinic visit weight 136.6 lbs)  Blood pressure: 119/81 mmHg  Heart rate: 48 bpm    09/03/17: BP 136/84, HR 53  Assessment: 1. Chronicsystolic CHF (SP23-30%), due toICM. NYHA classIsymptoms.  - Volume status stable  - Due to recent episodes of lightheadedness/dizziness with lower than normal HR reported at home, change to metoprolol succinate 12.5mg  daily for more consistent HR reduction throughout the day - Continue spironolactone 25mg  daily and losartan 25mg  daily now that BP at goal (doesn't qualify for Entresto with LVEF > 40%) - Basic disease state pathophysiology, medication indication, mechanism and side effects reviewed at length with patient and he verbalized understanding  2. Hyperlipidemia - Intolerant to atorvastatinandrosuvastatin with muscle pains. Has tried PCSK9  with minimal result. - 06/15/17 Lipids: TC  305, TG 202, LDL 218, HDL 47 - Due to episodes of mylagia with pravastatin and CoQ10, change to pravastatin  10 mg every other day - Continue ezetimibe - Will be seeing Dr. Debara [name] for further management in June  3. CADs/p CABG x1 (06/2016) - No CP. Follows with Dr [name]. - Switch to metoprolol succinate - Continue ASA 325mg  daily, ARB, and statin  4. HTN -BP at goal today < 130/80 mmHg -Continue ARB, BB, and spironolactone  Plan: 1) Medication changes: Based on clinical presentation, vital signs and recent labs will change to metoprolol succinate 12.5 mg daily and pravastatin 10mg  every other day 2) Labs: BMET today 3) Follow-up: Dr. Haroldine [name] 10/21/17   [name], PharmD, BCPS, CPP Clinical Pharmacist Pager: [phone] Phone: [phone] 09/15/2017 11:53 AM

## 2017-09-17 NOTE — Patient Instructions (Addendum)
It was great seeing you today!  Please STOP your metoprolol TARTRATE Please START your metoprolol SUCCINATE 12.5 mg (half of 25mg  tablet) ONCE DAILY  Please take your pravastatin EVERY OTHER DAY.  Please keep your appointment with Dr. [name] on 10/21/17.

## 2017-09-22 ENCOUNTER — Ambulatory Visit: Payer: Federal, State, Local not specified - PPO | Admitting: Cardiovascular Disease

## 2017-09-22 ENCOUNTER — Encounter (HOSPITAL_COMMUNITY): Payer: Self-pay

## 2017-09-22 ENCOUNTER — Encounter

## 2017-09-23 ENCOUNTER — Encounter (HOSPITAL_COMMUNITY)
Admission: RE | Admit: 2017-09-23 | Discharge: 2017-09-23 | Disposition: A | Discharge status: Home / Self Care | Payer: Self-pay | Source: Ambulatory Visit | Attending: Cardiovascular Disease | Admitting: Cardiovascular Disease

## 2017-09-23 DIAGNOSIS — Z951 Presence of aortocoronary bypass graft: Secondary | ICD-10-CM | POA: Insufficient documentation

## 2017-09-23 DIAGNOSIS — I214 Non-ST elevation (NSTEMI) myocardial infarction: Secondary | ICD-10-CM | POA: Insufficient documentation

## 2017-09-24 ENCOUNTER — Encounter (HOSPITAL_COMMUNITY)
Admission: RE | Admit: 2017-09-24 | Discharge: 2017-09-24 | Disposition: A | Discharge status: Home / Self Care | Payer: Self-pay | Source: Ambulatory Visit | Attending: Cardiovascular Disease | Admitting: Cardiovascular Disease

## 2017-09-24 DIAGNOSIS — F4310 Post-traumatic stress disorder, unspecified: Secondary | ICD-10-CM | POA: Diagnosis not present

## 2017-09-29 ENCOUNTER — Encounter (HOSPITAL_COMMUNITY)
Admission: RE | Admit: 2017-09-29 | Discharge: 2017-09-29 | Disposition: A | Discharge status: Home / Self Care | Payer: Self-pay | Source: Ambulatory Visit | Attending: Cardiovascular Disease | Admitting: Cardiovascular Disease

## 2017-09-30 ENCOUNTER — Encounter (HOSPITAL_COMMUNITY)
Admission: RE | Admit: 2017-09-30 | Discharge: 2017-09-30 | Disposition: A | Discharge status: Home / Self Care | Payer: Self-pay | Source: Ambulatory Visit | Attending: Cardiovascular Disease | Admitting: Cardiovascular Disease

## 2017-10-01 ENCOUNTER — Encounter (HOSPITAL_COMMUNITY)
Admission: RE | Admit: 2017-10-01 | Discharge: 2017-10-01 | Disposition: A | Discharge status: Home / Self Care | Payer: Self-pay | Source: Ambulatory Visit | Attending: Cardiovascular Disease | Admitting: Cardiovascular Disease

## 2017-10-06 ENCOUNTER — Encounter (HOSPITAL_COMMUNITY): Payer: Self-pay

## 2017-10-08 ENCOUNTER — Encounter (HOSPITAL_COMMUNITY): Payer: Self-pay

## 2017-10-13 ENCOUNTER — Encounter (HOSPITAL_COMMUNITY)
Admission: RE | Admit: 2017-10-13 | Discharge: 2017-10-13 | Disposition: A | Discharge status: Home / Self Care | Payer: Self-pay | Source: Ambulatory Visit | Attending: Cardiovascular Disease | Admitting: Cardiovascular Disease

## 2017-10-15 ENCOUNTER — Encounter: Payer: Federal, State, Local not specified - PPO | Admitting: Genetic Counselor

## 2017-10-15 ENCOUNTER — Encounter (HOSPITAL_COMMUNITY): Payer: Self-pay

## 2017-10-15 ENCOUNTER — Ambulatory Visit: Payer: Federal, State, Local not specified - PPO | Admitting: Genetic Counselor

## 2017-10-20 ENCOUNTER — Encounter (HOSPITAL_COMMUNITY): Payer: Self-pay

## 2017-10-21 ENCOUNTER — Encounter (HOSPITAL_COMMUNITY): Payer: Self-pay | Admitting: Internal Medicine

## 2017-10-21 ENCOUNTER — Encounter (HOSPITAL_COMMUNITY)
Admission: RE | Admit: 2017-10-21 | Discharge: 2017-10-21 | Disposition: A | Discharge status: Home / Self Care | Payer: Self-pay | Source: Ambulatory Visit | Attending: Cardiovascular Disease | Admitting: Cardiovascular Disease

## 2017-10-21 ENCOUNTER — Ambulatory Visit (HOSPITAL_COMMUNITY)
Admission: RE | Admit: 2017-10-21 | Discharge: 2017-10-21 | Disposition: A | Discharge status: Home / Self Care | Payer: Federal, State, Local not specified - PPO | Source: Ambulatory Visit | Attending: Internal Medicine | Admitting: Internal Medicine

## 2017-10-21 ENCOUNTER — Other Ambulatory Visit: Payer: Self-pay

## 2017-10-21 VITALS — BP 102/67 | HR 73 | Wt 137.0 lb

## 2017-10-21 DIAGNOSIS — Z888 Allergy status to other drugs, medicaments and biological substances status: Secondary | ICD-10-CM | POA: Diagnosis not present

## 2017-10-21 DIAGNOSIS — I2510 Atherosclerotic heart disease of native coronary artery without angina pectoris: Secondary | ICD-10-CM

## 2017-10-21 DIAGNOSIS — E785 Hyperlipidemia, unspecified: Secondary | ICD-10-CM | POA: Insufficient documentation

## 2017-10-21 DIAGNOSIS — E7849 Other hyperlipidemia: Secondary | ICD-10-CM

## 2017-10-21 DIAGNOSIS — Z951 Presence of aortocoronary bypass graft: Secondary | ICD-10-CM | POA: Diagnosis not present

## 2017-10-21 DIAGNOSIS — I252 Old myocardial infarction: Secondary | ICD-10-CM | POA: Diagnosis not present

## 2017-10-21 DIAGNOSIS — Z79899 Other long term (current) drug therapy: Secondary | ICD-10-CM | POA: Insufficient documentation

## 2017-10-21 DIAGNOSIS — Z882 Allergy status to sulfonamides status: Secondary | ICD-10-CM | POA: Diagnosis not present

## 2017-10-21 DIAGNOSIS — I5022 Chronic systolic (congestive) heart failure: Secondary | ICD-10-CM

## 2017-10-21 DIAGNOSIS — Z7982 Long term (current) use of aspirin: Secondary | ICD-10-CM | POA: Insufficient documentation

## 2017-10-21 DIAGNOSIS — I110 Hypertensive heart disease with heart failure: Secondary | ICD-10-CM | POA: Diagnosis not present

## 2017-10-21 DIAGNOSIS — I5020 Unspecified systolic (congestive) heart failure: Secondary | ICD-10-CM | POA: Diagnosis not present

## 2017-10-21 LAB — BASIC METABOLIC PANEL
Anion gap: 8 (ref 5–15)
BUN: 18 mg/dL (ref 6–20)
CO2: 24 mmol/L (ref 22–32)
CREATININE: 0.62 mg/dL (ref 0.44–1.00)
Calcium: 9.2 mg/dL (ref 8.9–10.3)
Chloride: 105 mmol/L (ref 101–111)
GFR calc Af Amer: >60 mL/min (ref >60)
GLUCOSE: 154 mg/dL — AB (ref 65–99)
Potassium: 4.3 mmol/L (ref 3.5–5.1)
Sodium: 137 mmol/L (ref 135–145)

## 2017-10-21 LAB — BRAIN NATRIURETIC PEPTIDE: B NATRIURETIC PEPTIDE 5: 34.7 pg/mL (ref 0.0–100.0)

## 2017-10-21 NOTE — Progress Notes (Signed)
[name] is here today for her post-test genetic consult for FH. We reviewed her pedigree to determine changes in medical history since we last met. She noted that there have been no changes. I briefly went over the major clinical features of FH and the function of proteins derived from the three genes, namely LDLR, APOB and PCSK9 that are implicated in [place].   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 I explained to her that this pathogenic variant destroys an important site for the protein to fold properly. Since the protein is not folded properly there is defective trafficking of the mutant protein and it is eventually degraded by the cell. I made it clear to [name] that she is heterozygous for the change. This means that she has one defective gene from one parent and one good gene from the other parent. While the defective gene cannot produce the LDL receptor protein, the good gene will continue to produce LDL receptors. However, since the cell does not have the full complement of LDL receptors, LDL-C is not efficiently taken up into the cell leading to hypercholesterolemia. I informed her that with this test, she is now confirmed to have familial hypercholesterolemia. She verbalized understanding of this.  We then discussed the autosomal dominant inheritance of FH. She has one child, a 11 year old daughter. She states that by age 53, she was already diagnosed with hypercholesterolemia, while her daughter does not seem to have the symptoms she had at age 32. I informed her that her daughter is at 50% risk of inheriting her condition. It is highly recommended that her daughter get tested for the familial pathogenic variant so appropriate treatment strategies can be initiated. I gave her a copy of her test result so she can have genetic testing.  We then reviewed the treatment options for patients with FH. I went over the different strategies that are employed for FH  patients. I explained to her the molecular pathways for Statin therapy and PCSK9 inhibitor therapy. We also reviewed other LDL-C lowering drugs and their mechanisms of action. I informed her that her genetic test will enable her physician to determine therapeutic approaches that will help lower her LDL-C. She states that she is scheduled to see Dr. [name] on June 7th.   I asked her if she had any additional questions or concerns and she expresses understanding of our conversation today.  Lattie Corns, Ph.D, [facility] Clinical Molecular Geneticist

## 2017-10-21 NOTE — Patient Instructions (Signed)
Labs drawn today (if we do not call you, then your lab work was stable)   Your physician has requested that you have an echocardiogram. Echocardiography is a painless test that uses sound waves to create images of your heart. It provides your doctor with information about the size and shape of your heart and how well your heart's chambers and valves are working. This procedure takes approximately one hour. There are no restrictions for this procedure.  Your physician recommends that you schedule a follow-up appointment in: 2-3 months with Dr. [name]  an a echocardiogram

## 2017-10-21 NOTE — Addendum Note (Signed)
Encounter addended by: Shirley Muscat, RN on: 10/21/2017 10:55 AM  Actions taken: Order list changed, Diagnosis association updated

## 2017-10-21 NOTE — Progress Notes (Signed)
Advanced Heart Failure Clinic Note   Referring Physician: [name] PCP: Lawerance Cruel, MD PCP-Cardiologist: No primary care provider on file.   HPI: [name] is a 47 y.o. female with a history of CAD s/p CABG x1 (06/2016) with LIMA to LAD, familial hyperlipidemia, and systolic HF due to ischemic cardiomyopathy.  Has been following in PharmD med clinic. Having trouble tolerating meds well. Failed carvedilol. Now on low-dose Toprol 12.5. Also on spiro 25 and losartan 25 (no Entresto with EF > 40%). Main complaint is that the statin is bugging her. Says she cannot even move. Otherwise doing ok. Going to CR Maintenance program 2-3x/week.Says she is fatigued but still working. Says can get dizzy at any time - worse if she turns her head quickly. Says it feels like vertigo.. Not just with standing. No CP. No edema, orthopnea or PND. Not on lasix.  She works full time as a Freight forwarder at [employer]. No tobacco, occasional beer, drug use. She is adopted.   Echo 04/2017: - Left ventricle: The cavity size was normal. Wall thickness was   normal. Systolic function was mildly to moderately reduced. The   estimated ejection fraction was in the range of 40% to 45%. There   is hypokinesis of the mid-apicalanteroseptal myocardium. There is   akinesis of the apical myocardium. Features are consistent with a   pseudonormal left ventricular filling pattern, with concomitant   abnormal relaxation and increased filling pressure (grade 2   diastolic dysfunction). - Mitral valve: There was mild regurgitation. - Pulmonary arteries: Systolic pressure was mildly increased. PA   peak pressure: 34 mm Hg (S).     Past Medical History:  Diagnosis Date  . Anemia    due to heavy periods  . Anxiety   . Coronary artery disease   . Difficult intubation 01/22/2016  . Headache   . Heart murmur    aschild antibiotics if having Dental work  . Hypocholesteremia    medical treatment  . Ischemic cardiomyopathy   .  Menorrhagia   . NSTEMI (non-ST elevated myocardial infarction) ([place])   . Pelvic pain     Current Outpatient Medications  Medication Sig Dispense Refill  . acetaminophen (TYLENOL) 325 MG tablet Take 325 mg by mouth every 6 (six) hours as needed.    Marland Kitchen aspirin EC 325 MG EC tablet Take 1 tablet (325 mg total) by mouth daily. 30 tablet 0  . Coenzyme Q10 (COQ10) 200 MG CAPS Take 200 mg by mouth daily.     Marland Kitchen ezetimibe (ZETIA) 10 MG tablet Take 1 tablet (10 mg total) by mouth daily. 30 tablet 5  . losartan (COZAAR) 25 MG tablet Take 1 tablet (25 mg total) by mouth daily. 30 tablet 3  . metoprolol succinate (TOPROL-XL) 25 MG 24 hr tablet Take 0.5 tablets (12.5 mg total) by mouth daily. 15 tablet 5  . pravastatin (PRAVACHOL) 10 MG tablet Take 1 tablet (10 mg total) by mouth every other day. 15 tablet 6  . spironolactone (ALDACTONE) 25 MG tablet Take 1 tablet (25 mg total) by mouth daily. 30 tablet 5   No current facility-administered medications for this encounter.     Allergies  Allergen Reactions  . Statins Other (See Comments)    Myalgias - rousuvastatin, atorvastatin high dose  . Bactrim Itching and Rash           Social History   Socioeconomic History  . Marital status: Married    Spouse name: Not on file  . Number  of children: Not on file  . Years of education: Not on file  . Highest education level: Not on file  Occupational History  . Not on file  Social Needs  . Financial resource strain: Not on file  . Food insecurity:    Worry: Not on file    Inability: Not on file  . Transportation needs:    Medical: Not on file    Non-medical: Not on file  Tobacco Use  . Smoking status: Never Smoker  . Smokeless tobacco: Never Used  Substance and Sexual Activity  . Alcohol use: No  . Drug use: No  . Sexual activity: Yes    Birth control/protection: None  Lifestyle  . Physical activity:    Days per week: Not on file    Minutes per session: Not on file  . Stress: Not on  file  Relationships  . Social connections:    Talks on phone: Not on file    Gets together: Not on file    Attends religious service: Not on file    Active member of club or organization: Not on file    Attends meetings of clubs or organizations: Not on file    Relationship status: Not on file  . Intimate partner violence:    Fear of current or ex partner: Not on file    Emotionally abused: Not on file    Physically abused: Not on file    Forced sexual activity: Not on file  Other Topics Concern  . Not on file  Social History Narrative  . Not on file      Family History  Adopted: Yes    Vitals:   10/21/17 0945  BP: 102/67  Pulse: 73  SpO2: 97%  Weight: 137 lb (62.1 kg)     PHYSICAL EXAM: General:  Well appearing. No resp difficulty HEENT: normal Neck: supple. no JVD. Carotids 2+ bilat; no bruits. No lymphadenopathy or thryomegaly appreciated. Cor: stable sternotomy scar PMI nondisplaced. Regular rate & rhythm. No rubs, gallops or murmurs. Lungs: clear Abdomen: soft, nontender, nondistended. No hepatosplenomegaly. No bruits or masses. Good bowel sounds. Extremities: no cyanosis, clubbing, rash, edema Neuro: alert & orientedx3, cranial nerves grossly intact. moves all 4 extremities w/o difficulty. Affect pleasant   ASSESSMENT & PLAN:  1. Systolic Heart Failure due to ICM. Echo 04/2017: EF 40-45% hypokinesis of the mid-apicalanteroseptal myocardium. - Stable/improved NYHA class II-early III.  - Continue spiro 25 mg daily and losartan 25 daily. BP too low to titrate currently. WIll see back in 2-3 months and try to increase losartan. Will repeat echo at that time - check labs with BMET, BNP - She does not quality for entresto with EF >40% - Can get CPX as needed. For now encouraged her to be as active as possible  2. CAD s/p CABG x1 (06/2016) with LIMA to LAD - No s/s ischemia. Follows with Dr [name] - Continue ASA 325 mg daily (should she be on 81 mg?)  3.  Hyperlipidemia - Intolerant to atorvastatin, crestor, and zetia with muscle pains. Has tried PCSK9 with minimal result.  - PharmD has been working with her on prava. No also on CoQ10.  - Has f/u in the [facility]   4. HTN - Blood pressure well controlled. Continue current regimen.   Glori Bickers, MD 10/21/17

## 2017-10-22 ENCOUNTER — Encounter (HOSPITAL_COMMUNITY)
Admission: RE | Admit: 2017-10-22 | Discharge: 2017-10-22 | Disposition: A | Discharge status: Home / Self Care | Payer: Self-pay | Source: Ambulatory Visit | Attending: Cardiovascular Disease | Admitting: Cardiovascular Disease

## 2017-10-27 ENCOUNTER — Encounter (HOSPITAL_COMMUNITY)
Admission: RE | Admit: 2017-10-27 | Discharge: 2017-10-27 | Disposition: A | Discharge status: Home / Self Care | Payer: Self-pay | Source: Ambulatory Visit | Attending: Cardiovascular Disease | Admitting: Cardiovascular Disease

## 2017-10-27 DIAGNOSIS — I214 Non-ST elevation (NSTEMI) myocardial infarction: Secondary | ICD-10-CM | POA: Insufficient documentation

## 2017-10-27 DIAGNOSIS — Z951 Presence of aortocoronary bypass graft: Secondary | ICD-10-CM | POA: Insufficient documentation

## 2017-10-28 ENCOUNTER — Encounter (HOSPITAL_COMMUNITY)
Admission: RE | Admit: 2017-10-28 | Discharge: 2017-10-28 | Disposition: A | Discharge status: Home / Self Care | Payer: Self-pay | Source: Ambulatory Visit | Attending: Internal Medicine | Admitting: Internal Medicine

## 2017-10-29 ENCOUNTER — Encounter (HOSPITAL_COMMUNITY)
Admission: RE | Admit: 2017-10-29 | Discharge: 2017-10-29 | Disposition: A | Discharge status: Home / Self Care | Payer: Self-pay | Source: Ambulatory Visit | Attending: Cardiovascular Disease | Admitting: Cardiovascular Disease

## 2017-10-30 ENCOUNTER — Ambulatory Visit (INDEPENDENT_AMBULATORY_CARE_PROVIDER_SITE_OTHER): Payer: Federal, State, Local not specified - PPO | Admitting: Internal Medicine

## 2017-10-30 ENCOUNTER — Encounter: Payer: Self-pay | Admitting: Internal Medicine

## 2017-10-30 VITALS — BP 129/83 | HR 75 | Ht 59.0 in | Wt 139.6 lb

## 2017-10-30 DIAGNOSIS — E785 Hyperlipidemia, unspecified: Secondary | ICD-10-CM | POA: Diagnosis not present

## 2017-10-30 DIAGNOSIS — I255 Ischemic cardiomyopathy: Secondary | ICD-10-CM | POA: Diagnosis not present

## 2017-10-30 DIAGNOSIS — E7849 Other hyperlipidemia: Secondary | ICD-10-CM | POA: Diagnosis not present

## 2017-10-30 DIAGNOSIS — Z951 Presence of aortocoronary bypass graft: Secondary | ICD-10-CM | POA: Diagnosis not present

## 2017-10-30 LAB — HEPATIC FUNCTION PANEL
ALT: 54 IU/L — AB (ref 0–32)
AST: 34 IU/L (ref 0–40)
Albumin: 4.8 g/dL (ref 3.5–5.5)
Alkaline Phosphatase: 65 IU/L (ref 39–117)
Bilirubin Total: 0.3 mg/dL (ref 0.0–1.2)
Bilirubin, Direct: 0.08 mg/dL (ref 0.00–0.40)
Total Protein: 7.5 g/dL (ref 6.0–8.5)

## 2017-10-30 LAB — LIPID PANEL
CHOLESTEROL TOTAL: 369 mg/dL — AB (ref 100–199)
Chol/HDL Ratio: 9.2 ratio — ABNORMAL HIGH (ref 0.0–4.4)
HDL: 40 mg/dL (ref >39)
LDL Calculated: 281 mg/dL — ABNORMAL HIGH (ref 0–99)
Triglycerides: 241 mg/dL — ABNORMAL HIGH (ref 0–149)
VLDL CHOLESTEROL CAL: 48 mg/dL — AB (ref 5–40)

## 2017-10-30 NOTE — Progress Notes (Signed)
OFFICE NOTE  Chief Complaint:  Follow-up dyslipidemia  Primary Care Physician: Lawerance Cruel, MD  HPI:  [name] is a 47 y.o. female with a past medial history significant for emergent single-vessel coronary artery bypass grafting with a 99% ostial LAD stenosis in February 2018.  She was not found to have any other significant obstructive coronary disease at the time.  Echo in May 2018 revealed an LVEF of 40-45% and an LAD wall motion abnormality.  He is a patient of Dr. [name].  She has an extensive history of dyslipidemia and was told by her uncle when she was a child in [place] that her cholesterol was very high and that she would likely need to be on treatment.  She is adopted and does not know her birth parents, but does have a daughter who is 63 years old.  She has not had cholesterol testing to her knowledge.  She had been started on statins, but reports significant intolerance, this is to high-dose atorvastatin, rosuvastatin, and possibly others that she cannot recall.  She was then referred to start [place].  Her lipid profile as of July 2018 showed total cholesterol 246, triglycerides 237, HDL 35 and LDL 164.  After starting therapy on Repatha, she received 4 doses, approximately 2 months of therapy and her repeat lipid profile showed a total cholesterol 265, triglycerides 220, LDL 176 and HDL of 45.  This is suggesting that she has had no response to the PCS canine inhibitor.  She reports compliance with the medication.  She had no side effects from it.  Possible reasons for this include a PCSK9 gain of function mutation or LDL receptor mutation.  Findings are possibly suggestive of familial hyperlipidemia although her LDL is moderately elevated.  Initially the plan was to consider switching her to Praluent and she has been provided samples, however, it is likely that she will have a similar poor response.  10/30/2017  [name] returns today for follow-up of dyslipidemia.  There is  clinical concern for familial hyperlipidemia and she does carry that diagnosis.  Unfortunately as previously mentioned she did not respond to PCSK9 inhibitor.  She did proceed with genetic testing after genetic counseling by Dr. Lattie Corns. 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  This finding is a pathogenic variant that does not allow the LDL receptor to fold properly and it is therefore poorly trafficked out of the golgi apparatus and rarely is expressed on the surface of the liver.  Based on this finding, it is understandable why a PCSK9 inhibitor which works by inhibiting the PCSK9 co-protein associated with surface LDL receptors would be ineffective as the total number of surface LDL receptors are extremely low.  We further discussed this mechanism and other strategies which may be helpful to lower her cholesterol.  She was again tried on pravastatin by the advanced heart failure clinic pharmacist, however had significant myalgias with this and may have some myopathy.  She says today that she is just now being able to straighten out the fingers on her hands.  At this point there are few additional options although she remains at high risk of recurrent events.  Her most recent cholesterol from several months ago showed total cholesterol 305, triglycerides 202, HDL 47 and LDL-C of 218.  Remaining options for her include LDL apheresis, for which she is likely a good candidate however is associated with significant cost and is cumbersome and would require her to likely  travel several times a month to either [facility] or [place].  The other option would be lomitapide.  By mechanism of inhibiting MTP, lomitapide should be effective at lowering her overall lipid profile by 30 to 40%, but is associated with significant GI side effects and liver toxicity.  This will need to be monitored closely and requires prescription by a provider who has completed the REMS  program (which I am certified).  PMHx:  Past Medical History:  Diagnosis Date  . Anemia    due to heavy periods  . Anxiety   . Coronary artery disease   . Difficult intubation 01/22/2016  . Headache   . Heart murmur    aschild antibiotics if having Dental work  . Hypocholesteremia    medical treatment  . Ischemic cardiomyopathy   . Menorrhagia   . NSTEMI (non-ST elevated myocardial infarction) ([place])   . Pelvic pain     Past Surgical History:  Procedure Laterality Date  . ABDOMINAL HYSTERECTOMY    . CORONARY ARTERY BYPASS GRAFT N/A 07/12/2016   Procedure: CORONARY ARTERY BYPASS GRAFTING (CABG), ON PUMP, TIMES ONE, USING LEFT INTERNAL MAMMARY ARTERY WITH TEE;  Surgeon: Gaye Pollack, MD;  Location: [facility];  Service: Open Heart Surgery;  Laterality: N/A;  LIMA to LAD  . goiter     left side goiter removed  . INGUINAL HERNIA REPAIR Bilateral 01/22/2016   Procedure: LAPAROSCOPIC BILATERAL FEMORAL AND RIGHT INGUINAL HERNIA REPAIRWITH INSERTION OF MESH;  Surgeon: Michael Boston, MD;  Location: [facility];  Service: General;  Laterality: Bilateral;  . LEFT HEART CATH AND CORONARY ANGIOGRAPHY N/A 07/12/2016   Procedure: Left Heart Cath and Coronary Angiography;  Surgeon: Lorretta Harp, MD;  Location: [facility];  Service: Cardiovascular;  Laterality: N/A;  . SVD     x 1  . WISDOM TOOTH EXTRACTION      FAMHx:  Family History  Adopted: Yes    SOCHx:   reports that she has never smoked. She has never used smokeless tobacco. She reports that she does not drink alcohol or use drugs.  ALLERGIES:  Allergies  Allergen Reactions  . Statins Other (See Comments)    Myalgias - rousuvastatin, atorvastatin high dose  . Bactrim Itching and Rash         ROS: Pertinent items noted in HPI and remainder of comprehensive ROS otherwise negative.  HOME MEDS: Current Outpatient Medications on File Prior to Visit  Medication Sig Dispense Refill  . acetaminophen (TYLENOL) 325 MG tablet  Take 325 mg by mouth every 6 (six) hours as needed.    Marland Kitchen aspirin EC 325 MG EC tablet Take 1 tablet (325 mg total) by mouth daily. 30 tablet 0  . Coenzyme Q10 (COQ10) 200 MG CAPS Take 200 mg by mouth daily.     Marland Kitchen ezetimibe (ZETIA) 10 MG tablet Take 1 tablet (10 mg total) by mouth daily. 30 tablet 5  . losartan (COZAAR) 25 MG tablet Take 1 tablet (25 mg total) by mouth daily. 30 tablet 3  . metoprolol succinate (TOPROL-XL) 25 MG 24 hr tablet Take 0.5 tablets (12.5 mg total) by mouth daily. 15 tablet 5  . spironolactone (ALDACTONE) 25 MG tablet Take 1 tablet (25 mg total) by mouth daily. 30 tablet 5  . pravastatin (PRAVACHOL) 10 MG tablet Take 1 tablet (10 mg total) by mouth every other day. (Patient not taking: Reported on 10/30/2017) 15 tablet 6   No current facility-administered medications on file prior to visit.  LABS/IMAGING: No results found for this or any previous visit (from the past 48 hour(s)). No results found.  LIPID PANEL:    Component Value Date/Time   CHOL 305 (H) 06/15/2017 1224   TRIG 202 (H) 06/15/2017 1224   HDL 47 06/15/2017 1224   CHOLHDL 6.5 (H) 06/15/2017 1224   CHOLHDL 7.0 (H) 12/12/2016 0750   VLDL 47 (H) 12/12/2016 0750   LDLCALC 218 (H) 06/15/2017 1224     WEIGHTS: Wt Readings from Last 3 Encounters:  10/30/17 139 lb 9.6 oz (63.3 kg)  10/21/17 137 lb (62.1 kg)  09/17/17 137 lb 6.4 oz (62.3 kg)    VITALS: BP 129/83   Pulse 75   Ht 4\' 11"  (1.499 m)   Wt 139 lb 9.6 oz (63.3 kg)   LMP 08/10/2011   BMI 28.20 kg/m   EXAM: Deferred  EKG: Deferred  ASSESSMENT: 1. Coronary artery disease status post single-vessel CABG to the LAD 2. Ischemic cardiomyopathy, EF 40-45% 3. 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 Documented statin intolerance 5. PCSK9 nonresponder  PLAN: 1.   [name] has heterozygous familial hypercholesterolemia with genetically confirmed mutation in the above-noted LDL receptor.  She has been  nonresponsive to PCSK9 inhibitors and intolerant of statins.  LDL remains greater than 200.  Recently she was tried on low-dose pravastatin with significant myalgia as well.  She still has soreness in her muscles and difficulty using her hands.  We will plan to repeat full liver profile today, lipid and CK.  She is considering starting lomitapide and we provided information on that today.  She understands that lomitapide has FDA approval for homozygous FH (HoFH), but has not been explicitly studied for heterozygous FH.  That being said, it is commonly used off label for this indication and is likely the only medication that would be helpful at lowering her cholesterol.  Although there is no specific outcome data from the small trials for lomitapide, it is generally excepted that lower levels of April be lipoprotein's are associated with decreased cardiovascular risk.  If she wishes to go forward then will need to fill out the requisite paperwork and financial assistance forms.  It will require monthly liver enzyme monitoring for the first year and then every 3 months thereafter.  Plan repeat lipid profile in about 3 months and follow-up with me at that time.  Rachel Casino, MD, [facility], [place] Director of the Advanced Lipid Disorders &  Cardiovascular Risk Reduction Clinic Attending Cardiologist  Direct Dial: [phone]  Fax: [phone]  Website:  [email or web address] 10/30/2017, 11:05 AM

## 2017-10-30 NOTE — Patient Instructions (Addendum)
Dr. [name] recommends Juxtapid ([email or web address]) - once on this medication, you will need lab work to check liver function on a routine basis every month for the first year and then every 3 months  Your physician recommends that you return for lab work TODAY  Your physician recommends that you schedule a follow-up appointment in [place] with Dr. [name].

## 2017-11-02 ENCOUNTER — Encounter: Payer: Self-pay | Admitting: Internal Medicine

## 2017-11-03 ENCOUNTER — Encounter (HOSPITAL_COMMUNITY): Payer: Self-pay

## 2017-11-05 ENCOUNTER — Encounter (HOSPITAL_COMMUNITY)
Admission: RE | Admit: 2017-11-05 | Discharge: 2017-11-05 | Disposition: A | Discharge status: Home / Self Care | Payer: Self-pay | Source: Ambulatory Visit | Attending: Cardiovascular Disease | Admitting: Cardiovascular Disease

## 2017-11-10 ENCOUNTER — Encounter (HOSPITAL_COMMUNITY): Payer: Self-pay

## 2017-11-11 ENCOUNTER — Encounter (HOSPITAL_COMMUNITY)
Admission: RE | Admit: 2017-11-11 | Discharge: 2017-11-11 | Disposition: A | Discharge status: Home / Self Care | Payer: Self-pay | Source: Ambulatory Visit | Attending: Cardiovascular Disease | Admitting: Cardiovascular Disease

## 2017-11-12 ENCOUNTER — Encounter (HOSPITAL_COMMUNITY)
Admission: RE | Admit: 2017-11-12 | Discharge: 2017-11-12 | Disposition: A | Discharge status: Home / Self Care | Payer: Self-pay | Source: Ambulatory Visit | Attending: Cardiovascular Disease | Admitting: Cardiovascular Disease

## 2017-11-12 ENCOUNTER — Telehealth: Payer: Self-pay | Admitting: Internal Medicine

## 2017-11-12 NOTE — Telephone Encounter (Signed)
Pt willing to proceed with starting the [place] therapy. Will forward to Dr [name] for directions .Adonis Housekeeper

## 2017-11-12 NOTE — Telephone Encounter (Signed)
New message   Pt c/o medication issue:  1. Name of Medication: Juxtapid  2. How are you currently taking this medication (dosage and times per day)? N/A  3. Are you having a reaction (difficulty breathing--STAT)? NO  4. What is your medication issue? Dr. [name] recommends Juxtapid ([email or web address]) - once on this medication, you will need lab work to check liver function on a routine basis every month for the first year and then every 3 months

## 2017-11-16 ENCOUNTER — Encounter: Payer: Self-pay | Admitting: Internal Medicine

## 2017-11-16 NOTE — Telephone Encounter (Signed)
I have started a prior authorization letter and filled out the prescribing paperwork - she needs to sign some of the paperwork and have a negative pregnancy test prior to starting. Will need repeat liver enzymes monthly for the first year. Starting dose is 5 mg daily. I will drop off paperwork in the office. Thanks.  Dr. Lemmie Evens

## 2017-11-17 ENCOUNTER — Encounter (HOSPITAL_COMMUNITY)
Admission: RE | Admit: 2017-11-17 | Discharge: 2017-11-17 | Disposition: A | Discharge status: Home / Self Care | Payer: Self-pay | Source: Ambulatory Visit | Attending: Cardiovascular Disease | Admitting: Cardiovascular Disease

## 2017-11-18 ENCOUNTER — Encounter (HOSPITAL_COMMUNITY)
Admission: RE | Admit: 2017-11-18 | Discharge: 2017-11-18 | Disposition: A | Discharge status: Home / Self Care | Payer: Self-pay | Source: Ambulatory Visit | Attending: Internal Medicine | Admitting: Internal Medicine

## 2017-11-18 NOTE — Telephone Encounter (Addendum)
Received Juxtapid [employer] enrollment paperwork from MD on 6/25. Faxed this with printed office note, labs, insurance info to [phone].   Notified patient via MyChart message that this was faxed

## 2017-11-19 ENCOUNTER — Encounter (HOSPITAL_COMMUNITY): Payer: Self-pay

## 2017-11-20 ENCOUNTER — Telehealth: Payer: Self-pay | Admitting: Internal Medicine

## 2017-11-20 NOTE — Telephone Encounter (Signed)
New Message   Violet is calling from [employer] is calling on behalf of patient. She states that they have not received the enrollment form. Please call to discuss

## 2017-11-20 NOTE — Telephone Encounter (Signed)
Attempted to return call x2 to [place] with Juxtapid [employer] and the number provided [phone]) goes to [facility], which is the dispensing pharmacy for [facility]. A rep provided the following phone number to try: [phone] - which is currently not in service.

## 2017-11-24 ENCOUNTER — Encounter (HOSPITAL_COMMUNITY): Payer: Self-pay

## 2017-11-24 DIAGNOSIS — Z951 Presence of aortocoronary bypass graft: Secondary | ICD-10-CM | POA: Insufficient documentation

## 2017-11-24 DIAGNOSIS — I214 Non-ST elevation (NSTEMI) myocardial infarction: Secondary | ICD-10-CM | POA: Insufficient documentation

## 2017-11-24 NOTE — Telephone Encounter (Signed)
Contacted Juxtapid [employer] at number provided from initial incoming call and spoke with rep from [facility]. They do not have patient's Rx as this info is received to them from [employer] enrollment. The rep offered the same phone number that was provided last week to which I explained this number was not in service and they rep confirmed this as well. The rep was unable to locate a different phone number but will email a manager to obtain additional contact info.

## 2017-11-25 NOTE — Telephone Encounter (Signed)
Re-faxed Juxtapid application to [phone] as previous message states application was not received.

## 2017-12-01 ENCOUNTER — Encounter (HOSPITAL_COMMUNITY): Payer: Self-pay

## 2017-12-01 NOTE — Telephone Encounter (Signed)
Spoke with [employer]. She would like to assist with PA for Juxtapid for patient. She states process is lengthy. She asks that I fax to [phone] labs, genetic testing info, office note, details on what meds she has tried/failed

## 2017-12-01 NOTE — Telephone Encounter (Signed)
Attempted to return call to [place], [facility]

## 2017-12-01 NOTE — Telephone Encounter (Signed)
Follow up    [name] is calling from [place] is calling in reference to beginning the prior authorization for [place].

## 2017-12-02 ENCOUNTER — Encounter (HOSPITAL_COMMUNITY)
Admission: RE | Admit: 2017-12-02 | Discharge: 2017-12-02 | Disposition: A | Discharge status: Home / Self Care | Payer: Self-pay | Source: Ambulatory Visit | Attending: Internal Medicine | Admitting: Internal Medicine

## 2017-12-02 NOTE — Telephone Encounter (Signed)
Juxtapid rep called in to office today and spoke with K. Alvstad RPH.   He suggested reaching out to [place] [phone]) for assistance with PA process  He also wanted to confirm that pages 5-6 were completed and submitted which allows dietician support to patient  Will follow up on these items

## 2017-12-03 ENCOUNTER — Encounter (HOSPITAL_COMMUNITY)
Admission: RE | Admit: 2017-12-03 | Discharge: 2017-12-03 | Disposition: A | Discharge status: Home / Self Care | Payer: Self-pay | Source: Ambulatory Visit | Attending: Cardiovascular Disease | Admitting: Cardiovascular Disease

## 2017-12-07 NOTE — Telephone Encounter (Signed)
Faxed requested info to Wendall Papa with [facility]

## 2017-12-08 ENCOUNTER — Encounter (HOSPITAL_COMMUNITY)
Admission: RE | Admit: 2017-12-08 | Discharge: 2017-12-08 | Disposition: A | Discharge status: Home / Self Care | Payer: Self-pay | Source: Ambulatory Visit | Attending: Cardiovascular Disease | Admitting: Cardiovascular Disease

## 2017-12-09 ENCOUNTER — Encounter (HOSPITAL_COMMUNITY)
Admission: RE | Admit: 2017-12-09 | Discharge: 2017-12-09 | Disposition: A | Discharge status: Home / Self Care | Payer: Self-pay | Source: Ambulatory Visit | Attending: Internal Medicine | Admitting: Internal Medicine

## 2017-12-10 ENCOUNTER — Encounter (HOSPITAL_COMMUNITY)
Admission: RE | Admit: 2017-12-10 | Discharge: 2017-12-10 | Disposition: A | Discharge status: Home / Self Care | Payer: Self-pay | Source: Ambulatory Visit | Attending: Cardiovascular Disease | Admitting: Cardiovascular Disease

## 2017-12-11 ENCOUNTER — Other Ambulatory Visit (HOSPITAL_COMMUNITY): Payer: Self-pay | Admitting: Internal Medicine

## 2017-12-15 ENCOUNTER — Encounter (HOSPITAL_COMMUNITY): Payer: Self-pay

## 2017-12-15 NOTE — Telephone Encounter (Signed)
Faxed additional info to Wendall Papa RN with [facility] - liver function test results

## 2017-12-16 ENCOUNTER — Encounter (HOSPITAL_COMMUNITY)
Admission: RE | Admit: 2017-12-16 | Discharge: 2017-12-16 | Disposition: A | Discharge status: Home / Self Care | Payer: Self-pay | Source: Ambulatory Visit | Attending: Internal Medicine | Admitting: Internal Medicine

## 2017-12-17 ENCOUNTER — Telehealth: Payer: Self-pay | Admitting: Internal Medicine

## 2017-12-17 ENCOUNTER — Encounter (HOSPITAL_COMMUNITY)
Admission: RE | Admit: 2017-12-17 | Discharge: 2017-12-17 | Disposition: A | Discharge status: Home / Self Care | Payer: Self-pay | Source: Ambulatory Visit | Attending: Cardiovascular Disease | Admitting: Cardiovascular Disease

## 2017-12-17 NOTE — Telephone Encounter (Addendum)
LMTCB - waiting on MD to review prior authorization packet

## 2017-12-17 NOTE — Telephone Encounter (Signed)
New message  Status update on the juxtapid prior authorization. pls advise .

## 2017-12-22 ENCOUNTER — Encounter (HOSPITAL_COMMUNITY)
Admission: RE | Admit: 2017-12-22 | Discharge: 2017-12-22 | Disposition: A | Discharge status: Home / Self Care | Payer: Self-pay | Source: Ambulatory Visit | Attending: Cardiovascular Disease | Admitting: Cardiovascular Disease

## 2017-12-22 NOTE — Progress Notes (Addendum)
[name] reported for the cardiac rehab maintenance program today. During her exercise session she complained of feeling lightheaded. When questioning her she states that she has been feeling this way the last 2-3 days. [name] states that she did not eat any breakfast. Fixed her some gator aide and gave her a banana to eat. We checked her B lying 125/75 HR 58, sitting 131/75 HR 66, and standing 122/76 HR 66. She states that she feels better lying but no c/o of real dizziness. Instructed her to eat breakfast before trying to exercise,and to always sit before standing when she has been lying. I strongly encouraged her not to drive if she was feeling lightheaded and to report this to her MD. She has a scheduled appointment with the cardiologist next week. I instructed her to call MD if she continues to have these symptoms. She completed a few laps on the track and complete cool down. She left in stable condition with BP 102/80 HR70.

## 2017-12-24 ENCOUNTER — Other Ambulatory Visit (HOSPITAL_COMMUNITY): Payer: Self-pay | Admitting: *Deleted

## 2017-12-24 ENCOUNTER — Encounter (HOSPITAL_COMMUNITY): Payer: Self-pay

## 2017-12-24 DIAGNOSIS — I5022 Chronic systolic (congestive) heart failure: Secondary | ICD-10-CM

## 2017-12-24 DIAGNOSIS — Z951 Presence of aortocoronary bypass graft: Secondary | ICD-10-CM | POA: Insufficient documentation

## 2017-12-24 DIAGNOSIS — I214 Non-ST elevation (NSTEMI) myocardial infarction: Secondary | ICD-10-CM | POA: Insufficient documentation

## 2017-12-29 ENCOUNTER — Encounter (HOSPITAL_COMMUNITY)
Admission: RE | Admit: 2017-12-29 | Discharge: 2017-12-29 | Disposition: A | Discharge status: Home / Self Care | Payer: Self-pay | Source: Ambulatory Visit | Attending: Cardiovascular Disease | Admitting: Cardiovascular Disease

## 2017-12-30 ENCOUNTER — Other Ambulatory Visit: Payer: Self-pay

## 2017-12-30 ENCOUNTER — Encounter (HOSPITAL_COMMUNITY): Payer: Self-pay | Admitting: Internal Medicine

## 2017-12-30 ENCOUNTER — Ambulatory Visit (HOSPITAL_BASED_OUTPATIENT_CLINIC_OR_DEPARTMENT_OTHER)
Admission: RE | Admit: 2017-12-30 | Discharge: 2017-12-30 | Disposition: A | Discharge status: Home / Self Care | Payer: Federal, State, Local not specified - PPO | Source: Ambulatory Visit

## 2017-12-30 ENCOUNTER — Ambulatory Visit (HOSPITAL_COMMUNITY)
Admission: RE | Admit: 2017-12-30 | Discharge: 2017-12-30 | Disposition: A | Discharge status: Home / Self Care | Payer: Federal, State, Local not specified - PPO | Source: Ambulatory Visit | Attending: Internal Medicine | Admitting: Internal Medicine

## 2017-12-30 VITALS — BP 120/68 | HR 58 | Wt 137.5 lb

## 2017-12-30 DIAGNOSIS — I255 Ischemic cardiomyopathy: Secondary | ICD-10-CM

## 2017-12-30 DIAGNOSIS — Z7982 Long term (current) use of aspirin: Secondary | ICD-10-CM | POA: Diagnosis not present

## 2017-12-30 DIAGNOSIS — I110 Hypertensive heart disease with heart failure: Secondary | ICD-10-CM | POA: Diagnosis not present

## 2017-12-30 DIAGNOSIS — E7800 Pure hypercholesterolemia, unspecified: Secondary | ICD-10-CM

## 2017-12-30 DIAGNOSIS — I252 Old myocardial infarction: Secondary | ICD-10-CM | POA: Insufficient documentation

## 2017-12-30 DIAGNOSIS — Z888 Allergy status to other drugs, medicaments and biological substances status: Secondary | ICD-10-CM | POA: Insufficient documentation

## 2017-12-30 DIAGNOSIS — Z951 Presence of aortocoronary bypass graft: Secondary | ICD-10-CM | POA: Insufficient documentation

## 2017-12-30 DIAGNOSIS — E785 Hyperlipidemia, unspecified: Secondary | ICD-10-CM | POA: Diagnosis not present

## 2017-12-30 DIAGNOSIS — E7849 Other hyperlipidemia: Secondary | ICD-10-CM

## 2017-12-30 DIAGNOSIS — Z882 Allergy status to sulfonamides status: Secondary | ICD-10-CM | POA: Insufficient documentation

## 2017-12-30 DIAGNOSIS — F419 Anxiety disorder, unspecified: Secondary | ICD-10-CM | POA: Insufficient documentation

## 2017-12-30 DIAGNOSIS — I5022 Chronic systolic (congestive) heart failure: Secondary | ICD-10-CM

## 2017-12-30 DIAGNOSIS — Z79899 Other long term (current) drug therapy: Secondary | ICD-10-CM | POA: Diagnosis not present

## 2017-12-30 DIAGNOSIS — I2510 Atherosclerotic heart disease of native coronary artery without angina pectoris: Secondary | ICD-10-CM | POA: Insufficient documentation

## 2017-12-30 MED ORDER — ASPIRIN EC 81 MG PO TBEC: 81.0000 mg | DELAYED_RELEASE_TABLET | Freq: Every day | ORAL | Status: AC

## 2017-12-30 NOTE — Patient Instructions (Signed)
Decrease Aspirin to 81mg  daily.  Follow up with Dr.Bensimhon as needed.

## 2017-12-30 NOTE — Progress Notes (Signed)
Advanced Heart Failure Clinic Note   Referring Physician: [name] PCP: Lawerance Cruel, MD PCP-Cardiologist: No primary care provider on file.   HPI: [name] is a 47 y.o. female with a history of CAD s/p CABG x1 (06/2016) with LIMA to LAD, familial hyperlipidemia, and systolic HF due to ischemic cardiomyopathy.  She presents today for regular follow up. Echo today EF 45-50% mild anterior wall HK.Marland Kitchen Overall doing okay. SOB with stairs and inclines. Can walk around grocery store with no problems. Last week was dizzy at rest with headaches and muscle cramps. Also had some constipation, but not improved. Denies edema, orthopnea, or edema. No CP, but has some breast pain on left side. She has a nonproductive cough that has been going on for a while. No fever or chills. Going to cardiac rehab. Doesn't eat breakfast before she goes says she thinks she might be dehydrated at times. Weights stable at home ~135 lbs. Dr [name] is treating her [place] with a new drug, but has not started yet. She does not take lasix.   She works full time as a Freight forwarder at [employer]. No tobacco, occasional beer, drug use. She is adopted.   Echo 04/2017: - Left ventricle: The cavity size was normal. Wall thickness was   normal. Systolic function was mildly to moderately reduced. The   estimated ejection fraction was in the range of 40% to 45%. There   is hypokinesis of the mid-apicalanteroseptal myocardium. There is   akinesis of the apical myocardium. Features are consistent with a   pseudonormal left ventricular filling pattern, with concomitant   abnormal relaxation and increased filling pressure (grade 2   diastolic dysfunction). - Mitral valve: There was mild regurgitation. - Pulmonary arteries: Systolic pressure was mildly increased. PA   peak pressure: 34 mm Hg (S).  Review of systems complete and found to be negative unless listed in HPI.   Past Medical History:  Diagnosis Date  . Anemia    due to heavy periods    . Anxiety   . Coronary artery disease   . Difficult intubation 01/22/2016  . Headache   . Heart murmur    aschild antibiotics if having Dental work  . Hypocholesteremia    medical treatment  . Ischemic cardiomyopathy   . Menorrhagia   . NSTEMI (non-ST elevated myocardial infarction) ([place])   . Pelvic pain     Current Outpatient Medications  Medication Sig Dispense Refill  . acetaminophen (TYLENOL) 325 MG tablet Take 325 mg by mouth every 6 (six) hours as needed.    Marland Kitchen aspirin EC 325 MG EC tablet Take 1 tablet (325 mg total) by mouth daily. 30 tablet 0  . Coenzyme Q10 (COQ10) 200 MG CAPS Take 200 mg by mouth daily.     Marland Kitchen ezetimibe (ZETIA) 10 MG tablet Take 1 tablet (10 mg total) by mouth daily. 30 tablet 5  . losartan (COZAAR) 25 MG tablet TAKE 1 TABLET (25 MG TOTAL) BY MOUTH DAILY. 30 tablet 3  . metoprolol succinate (TOPROL-XL) 25 MG 24 hr tablet Take 0.5 tablets (12.5 mg total) by mouth daily. 15 tablet 5  . pravastatin (PRAVACHOL) 10 MG tablet Take 1 tablet (10 mg total) by mouth every other day. 15 tablet 6  . spironolactone (ALDACTONE) 25 MG tablet Take 1 tablet (25 mg total) by mouth daily. 30 tablet 5   No current facility-administered medications for this encounter.     Allergies  Allergen Reactions  . Statins Other (See Comments)  Myalgias - rousuvastatin, atorvastatin high dose  . Bactrim Itching and Rash           Social History   Socioeconomic History  . Marital status: Married    Spouse name: Not on file  . Number of children: Not on file  . Years of education: Not on file  . Highest education level: Not on file  Occupational History  . Not on file  Social Needs  . Financial resource strain: Not on file  . Food insecurity:    Worry: Not on file    Inability: Not on file  . Transportation needs:    Medical: Not on file    Non-medical: Not on file  Tobacco Use  . Smoking status: Never Smoker  . Smokeless tobacco: Never Used  Substance and Sexual  Activity  . Alcohol use: No  . Drug use: No  . Sexual activity: Yes    Birth control/protection: None  Lifestyle  . Physical activity:    Days per week: Not on file    Minutes per session: Not on file  . Stress: Not on file  Relationships  . Social connections:    Talks on phone: Not on file    Gets together: Not on file    Attends religious service: Not on file    Active member of club or organization: Not on file    Attends meetings of clubs or organizations: Not on file    Relationship status: Not on file  . Intimate partner violence:    Fear of current or ex partner: Not on file    Emotionally abused: Not on file    Physically abused: Not on file    Forced sexual activity: Not on file  Other Topics Concern  . Not on file  Social History Narrative  . Not on file      Family History  Adopted: Yes    Vitals:   12/30/17 1027  BP: 120/68  Pulse: (!) 58  SpO2: 100%  Weight: 137 lb 8 oz (62.4 kg)   Wt Readings from Last 3 Encounters:  12/30/17 137 lb 8 oz (62.4 kg)  10/30/17 139 lb 9.6 oz (63.3 kg)  10/21/17 137 lb (62.1 kg)    PHYSICAL EXAM: General:  Well appearing. No resp difficulty HEENT: normal Neck: supple. no JVD. Carotids 2+ bilat; no bruits. No lymphadenopathy or thryomegaly appreciated. Cor: PMI nondisplaced. Regular rate & rhythm. No rubs, gallops or murmurs. Lungs: clear Abdomen: soft, nontender, nondistended. No hepatosplenomegaly. No bruits or masses. Good bowel sounds. Extremities: no cyanosis, clubbing, rash, edema. Extensive xanthomas Neuro: alert & orientedx3, cranial nerves grossly intact. moves all 4 extremities w/o difficulty. Affect pleasant   ASSESSMENT & PLAN:  1. Systolic Heart Failure due to ICM. Echo 04/2017: EF 40-45% hypokinesis of the mid-apicalanteroseptal myocardium. - Echo today EF 45-50%  - Improved NYHA class II - Continue spiro 25 mg daily  - Continue losartan 25 daily. - Continue Toprol 12.5 mg daily. No HR room for  increase.  - She does not quality for entresto with EF >40% - Continue cardiac rehab maintenance program  2. CAD s/p CABG x1 (06/2016) with LIMA to LAD - No s/s ischemia. Follows with Dr [name] -Decrease ASA 325 mg daily-> 81mg  daily    3. Hyperlipidemia - Intolerant to atorvastatin, crestor, and zetia with muscle pains. Has tried PCSK9 with minimal result.  - Following in the Lipid Clinic   4. HTN - Well controlled today.  Georgiana Shore, NP 12/30/17  Patient seen and examined with the above-signed Advanced Practice Provider and/or Housestaff. I personally reviewed laboratory data, imaging studies and relevant notes. I independently examined the patient and formulated the important aspects of the plan. I have edited the note to reflect any of my changes or salient points. I have personally discussed the plan with the patient and/or family.  Echo reviewed personally EF 45-50%. Doing well NYHA I-II. No volume overload. No anginal symptoms. Has complex familial hyperlipidemia and following with Dr. [name]. BP too soft to titrate meds at this point. Will decrease ASA.   Can graduate the HF Clinic. F/u with Dr. [name].   Glori Bickers, MD  9:08 PM

## 2017-12-30 NOTE — Progress Notes (Signed)
  Echocardiogram 2D Echocardiogram has been performed.  Merrie Roof F 12/30/2017, 10:03 AM

## 2017-12-31 ENCOUNTER — Encounter (HOSPITAL_COMMUNITY): Payer: Self-pay

## 2018-01-05 ENCOUNTER — Encounter (HOSPITAL_COMMUNITY)
Admission: RE | Admit: 2018-01-05 | Discharge: 2018-01-05 | Disposition: A | Discharge status: Home / Self Care | Payer: Self-pay | Source: Ambulatory Visit | Attending: Cardiovascular Disease | Admitting: Cardiovascular Disease

## 2018-01-05 NOTE — Telephone Encounter (Signed)
Faxed to Michele Mcalpine RN with [facility] (attn: prior approval clinical services) prior approval request packet for [place]  Fax: [phone]

## 2018-01-06 ENCOUNTER — Encounter (HOSPITAL_COMMUNITY)
Admission: RE | Admit: 2018-01-06 | Discharge: 2018-01-06 | Disposition: A | Discharge status: Home / Self Care | Payer: Self-pay | Source: Ambulatory Visit | Attending: Cardiovascular Disease | Admitting: Cardiovascular Disease

## 2018-01-07 ENCOUNTER — Encounter (HOSPITAL_COMMUNITY): Payer: Self-pay

## 2018-01-11 NOTE — Telephone Encounter (Signed)
Received fax notice from patient's insurance that Juxtapid is NOT approvable at this time - the diagnosis of HeFH is considered an experimental or investigational indication for this drug.   To speak with claim reviewed at [facility] call center - call [phone]

## 2018-01-12 ENCOUNTER — Encounter (HOSPITAL_COMMUNITY)
Admission: RE | Admit: 2018-01-12 | Discharge: 2018-01-12 | Disposition: A | Discharge status: Home / Self Care | Payer: Self-pay | Source: Ambulatory Visit | Attending: Cardiovascular Disease | Admitting: Cardiovascular Disease

## 2018-01-12 NOTE — Telephone Encounter (Signed)
Spoke with [employer] F[employer] -> they said patient has to submit and signed appeal letter to the denial of [place]. I will then need to submit a letter requesting a PEER to PEER authorization to speak with a physician to approve the drug as medically necessary, since the use is considered off-label, investigational.  Dr. Lemmie Evens

## 2018-01-13 ENCOUNTER — Encounter (HOSPITAL_COMMUNITY)
Admission: RE | Admit: 2018-01-13 | Discharge: 2018-01-13 | Disposition: A | Discharge status: Home / Self Care | Payer: Self-pay | Source: Ambulatory Visit | Attending: Internal Medicine | Admitting: Internal Medicine

## 2018-01-14 ENCOUNTER — Encounter (HOSPITAL_COMMUNITY)
Admission: RE | Admit: 2018-01-14 | Discharge: 2018-01-14 | Disposition: A | Discharge status: Home / Self Care | Payer: Self-pay | Source: Ambulatory Visit | Attending: Cardiovascular Disease | Admitting: Cardiovascular Disease

## 2018-01-14 NOTE — Telephone Encounter (Signed)
Spoke with patient to provide update on status of Juxtapid. She is interested in pursing an appeals process. She will be in the office for an appointment with Dr. [name] tomorrow and can sign a letter for appeals.   She inquired about patient assistance for the medication and I reached out to contact Wendall Papa with [facility] on this and will follow up with patient.

## 2018-01-15 ENCOUNTER — Encounter: Payer: Self-pay | Admitting: Cardiovascular Disease

## 2018-01-15 ENCOUNTER — Ambulatory Visit: Payer: Federal, State, Local not specified - PPO | Admitting: Cardiovascular Disease

## 2018-01-15 ENCOUNTER — Encounter: Payer: Self-pay | Admitting: Internal Medicine

## 2018-01-15 VITALS — BP 112/74 | HR 75 | Ht 59.0 in | Wt 138.0 lb

## 2018-01-15 DIAGNOSIS — Z951 Presence of aortocoronary bypass graft: Secondary | ICD-10-CM | POA: Diagnosis not present

## 2018-01-15 DIAGNOSIS — E785 Hyperlipidemia, unspecified: Secondary | ICD-10-CM | POA: Diagnosis not present

## 2018-01-15 DIAGNOSIS — I255 Ischemic cardiomyopathy: Secondary | ICD-10-CM

## 2018-01-15 NOTE — Assessment & Plan Note (Signed)
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  Her most recent cholesterol performed 10/30/2017 revealed total cholesterol 369 with an LDL of 281, HDL 40 and triglyceride level of 241.  I have sent her to Dr. [name] for advanced lipid evaluation and therapy.  She may be a candidate for Juxtibid.

## 2018-01-15 NOTE — Assessment & Plan Note (Signed)
History of ischemic heart myopathy with an EF of 40 to 45% by 2D echo 10/21/2016 which is improved to the 45 to 50% range recently.  She is on optimal medical therapy for this.  She has seen Dr. [name] recently was released her.  She is NYHA class I/II.

## 2018-01-15 NOTE — Patient Instructions (Signed)
Your physician wants you to follow-up in: ONE YEAR WITH DR BERRY You will receive a reminder letter in the mail two months in advance. If you don't receive a letter, please call our office to schedule the follow-up appointment.   If you need a refill on your cardiac medications before your next appointment, please call your pharmacy.  

## 2018-01-15 NOTE — Telephone Encounter (Signed)
Letter composed. Will provide to patient to sign off on today while in office for appointment with Dr. [name]

## 2018-01-15 NOTE — Progress Notes (Signed)
01/15/2018 [name]   06/07/1970  [id number]  Primary Physician Lawerance Cruel, MD Primary Cardiologist: Lorretta Harp MD Lupe Carney, [place]  HPI:  [name] is a 47 y.o.  mildly overweight married Caucasian female mother of one 58 year old daughter. I last saw her in the 07/15/2017.She underwentcoronary artery bypass grafting times one emergently by Dr. Cyndia Bent on 07/12/16. The remainder of her coronary arteries were unremarkable. She did have a non-STEMI on that day with progressively rising troponins and EKG changes. She had a 99% ostial LAD with anteroapical wall motion abnormality. She is recuperating nicely. She is about to start cardiac rehabilitation.She denies chest pain or shortness of breath. She does complain of some cramping in her legs which may be statin-related. Her last echo performed 10/21/16 revealed ejection fraction of 40-45% with wall motion and a all motion abnormality in the LAD distribution. She was begun on carvedilol which apparently she did not tolerate. She was alsoplaced on Repathawhich did not affect her lipid profile as well as Praluent. She was referred to Dr. Hessie Knows for genetic testing for presumed FH. Repeat 2-D echo performed 05/06/17 revealed an EF of 40-45% with anteroapical hypokinesia.  I did refer her to Dr. [name] for advanced lipid evaluation therapy.  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  She was unresponsive to statin therapy and PCSK9.  She is now being tried on Juxtapid.  I also sent her to Dr. [name] in the advanced heart failure clinic.  She is on optimal medical therapy for this.  She is now NYHA class I-2 is relatively asymptomatic with an EF of 45 to 50%.    Current Meds  Medication Sig  . acetaminophen (TYLENOL) 325 MG tablet Take 325 mg by mouth every 6 (six) hours as needed.  Marland Kitchen aspirin 81 MG tablet Take 1 tablet (81 mg total) by mouth daily.  . Coenzyme Q10 (COQ10) 200 MG CAPS Take 200 mg by mouth daily.     Marland Kitchen ezetimibe (ZETIA) 10 MG tablet Take 1 tablet (10 mg total) by mouth daily.  Marland Kitchen losartan (COZAAR) 25 MG tablet TAKE 1 TABLET (25 MG TOTAL) BY MOUTH DAILY.  . metoprolol succinate (TOPROL-XL) 25 MG 24 hr tablet Take 0.5 tablets (12.5 mg total) by mouth daily.  . pravastatin (PRAVACHOL) 10 MG tablet Take 1 tablet (10 mg total) by mouth every other day.  . spironolactone (ALDACTONE) 25 MG tablet Take 1 tablet (25 mg total) by mouth daily.     Allergies  Allergen Reactions  . Statins Other (See Comments)    Myalgias - rousuvastatin, atorvastatin high dose  . Bactrim Itching and Rash         Social History   Socioeconomic History  . Marital status: Married    Spouse name: Not on file  . Number of children: Not on file  . Years of education: Not on file  . Highest education level: Not on file  Occupational History  . Not on file  Social Needs  . Financial resource strain: Not on file  . Food insecurity:    Worry: Not on file    Inability: Not on file  . Transportation needs:    Medical: Not on file    Non-medical: Not on file  Tobacco Use  . Smoking status: Never Smoker  . Smokeless tobacco: Never Used  Substance and Sexual Activity  . Alcohol use: No  . Drug use: No  . Sexual activity: Yes  Birth control/protection: None  Lifestyle  . Physical activity:    Days per week: Not on file    Minutes per session: Not on file  . Stress: Not on file  Relationships  . Social connections:    Talks on phone: Not on file    Gets together: Not on file    Attends religious service: Not on file    Active member of club or organization: Not on file    Attends meetings of clubs or organizations: Not on file    Relationship status: Not on file  . Intimate partner violence:    Fear of current or ex partner: Not on file    Emotionally abused: Not on file    Physically abused: Not on file    Forced sexual activity: Not on file  Other Topics Concern  . Not on file  Social  History Narrative  . Not on file     Review of Systems: General: negative for chills, fever, night sweats or weight changes.  Cardiovascular: negative for chest pain, dyspnea on exertion, edema, orthopnea, palpitations, paroxysmal nocturnal dyspnea or shortness of breath Dermatological: negative for rash Respiratory: negative for cough or wheezing Urologic: negative for hematuria Abdominal: negative for nausea, vomiting, diarrhea, bright red blood per rectum, melena, or hematemesis Neurologic: negative for visual changes, syncope, or dizziness All other systems reviewed and are otherwise negative except as noted above.    Blood pressure 112/74, pulse 75, height 4\' 11"  (1.499 m), weight 138 lb (62.6 kg), last menstrual period 08/10/2011.  General appearance: alert and no distress Neck: no adenopathy, no carotid bruit, no JVD, supple, symmetrical, trachea midline and thyroid not enlarged, symmetric, no tenderness/mass/nodules Lungs: clear to auscultation bilaterally Heart: regular rate and rhythm, S1, S2 normal, no murmur, click, rub or gallop Extremities: extremities normal, atraumatic, no cyanosis or edema Pulses: 2+ and symmetric Skin: Skin color, texture, turgor normal. No rashes or lesions Neurologic: Alert and oriented X 3, normal strength and tone. Normal symmetric reflexes. Normal coordination and gait  EKG sinus rhythm at 75 with evidence of an anterior infarct.  I personally reviewed this EKG.  ASSESSMENT AND PLAN:   S/P CABG x 1 History of CAD status post non-STEMI with cath revealing a 91% ostial LAD with anteroapical wall motion of normality.  She underwent coronary artery bypass grafting x1 emergently by Dr. Cyndia Bent 07/12/2016 with a LIMA to her LAD.  Her EF has progressively improved now in the 45 to 50% range on optimal medical therapy.  She is exercising frequently and is asymptomatic.  Hyperlipidemia LDL goal <70 History of hyperlipidemia with a genetic variant  intolerant to statin drugs and PCSK9 medications.  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  Her most recent cholesterol performed 10/30/2017 revealed total cholesterol 369 with an LDL of 281, HDL 40 and triglyceride level of 241.  I have sent her to Dr. [name] for advanced lipid evaluation and therapy.  She may be a candidate for Juxtibid.   Ischemic cardiomyopathy History of ischemic heart myopathy with an EF of 40 to 45% by 2D echo 10/21/2016 which is improved to the 45 to 50% range recently.  She is on optimal medical therapy for this.  She has seen Dr. [name] recently was released her.  She is NYHA class I/II.      Lorretta Harp MD FACP,FACC,FAHA, [facility] 01/15/2018 9:53 AM

## 2018-01-15 NOTE — Assessment & Plan Note (Signed)
History of CAD status post non-STEMI with cath revealing a 91% ostial LAD with anteroapical wall motion of normality.  She underwent coronary artery bypass grafting x1 emergently by Dr. Cyndia Bent 07/12/2016 with a LIMA to her LAD.  Her EF has progressively improved now in the 45 to 50% range on optimal medical therapy.  She is exercising frequently and is asymptomatic.

## 2018-01-18 NOTE — Telephone Encounter (Signed)
Faxed signed letter to [place] @ [phone]

## 2018-01-19 ENCOUNTER — Encounter (HOSPITAL_COMMUNITY): Payer: Self-pay

## 2018-01-20 ENCOUNTER — Encounter (HOSPITAL_COMMUNITY)
Admission: RE | Admit: 2018-01-20 | Discharge: 2018-01-20 | Disposition: A | Discharge status: Home / Self Care | Payer: Self-pay | Source: Ambulatory Visit | Attending: Cardiovascular Disease | Admitting: Cardiovascular Disease

## 2018-01-21 ENCOUNTER — Encounter (HOSPITAL_COMMUNITY)
Admission: RE | Admit: 2018-01-21 | Discharge: 2018-01-21 | Disposition: A | Discharge status: Home / Self Care | Payer: Self-pay | Source: Ambulatory Visit | Attending: Cardiovascular Disease | Admitting: Cardiovascular Disease

## 2018-01-21 NOTE — Telephone Encounter (Signed)
Per correspondence received from [facility] FEP clinical call center, patient will need to mail or fax a letter of request for appeals of Juxtapid herself (the member) to [phone] or mail to Service Benefit Plan, [place], Mail [address], [place] [address], Attn: [place]  Will notify patient via [place]

## 2018-01-26 ENCOUNTER — Encounter (HOSPITAL_COMMUNITY)
Admission: RE | Admit: 2018-01-26 | Discharge: 2018-01-26 | Disposition: A | Discharge status: Home / Self Care | Payer: Self-pay | Source: Ambulatory Visit | Attending: Cardiovascular Disease | Admitting: Cardiovascular Disease

## 2018-01-26 DIAGNOSIS — I214 Non-ST elevation (NSTEMI) myocardial infarction: Secondary | ICD-10-CM | POA: Insufficient documentation

## 2018-01-26 DIAGNOSIS — Z951 Presence of aortocoronary bypass graft: Secondary | ICD-10-CM | POA: Insufficient documentation

## 2018-01-27 ENCOUNTER — Encounter (HOSPITAL_COMMUNITY)
Admission: RE | Admit: 2018-01-27 | Discharge: 2018-01-27 | Disposition: A | Discharge status: Home / Self Care | Payer: Self-pay | Source: Ambulatory Visit | Attending: Internal Medicine | Admitting: Internal Medicine

## 2018-01-28 ENCOUNTER — Encounter (HOSPITAL_COMMUNITY): Payer: Self-pay

## 2018-01-28 NOTE — Telephone Encounter (Signed)
Received notified from [facility] FEP that they are needing current medication & disease history specifically related to [place] including relevant clinical exam notes & lab tests. This information has been faxed to [phone]

## 2018-02-02 ENCOUNTER — Encounter (HOSPITAL_COMMUNITY)
Admission: RE | Admit: 2018-02-02 | Discharge: 2018-02-02 | Disposition: A | Discharge status: Home / Self Care | Payer: Self-pay | Source: Ambulatory Visit | Attending: Cardiovascular Disease | Admitting: Cardiovascular Disease

## 2018-02-04 ENCOUNTER — Telehealth: Payer: Self-pay | Admitting: Internal Medicine

## 2018-02-04 ENCOUNTER — Encounter (HOSPITAL_COMMUNITY): Payer: Self-pay

## 2018-02-04 NOTE — Telephone Encounter (Signed)
New Message:     Patient would like to speak Dr. [name] nurse concerning the appeal with her insurance company.

## 2018-02-04 NOTE — Telephone Encounter (Signed)
Spoke with patient. Addressed/documented in another phone note.

## 2018-02-04 NOTE — Telephone Encounter (Signed)
Patient called to inform us that [place] is requesting more info, per letter she received last week. Explained this was also received by our office and info has been faxed.   She also states she received a call from [facility] patient consultation program. Advised I am unaware of this program but every insurance company is different and it could be that since this is a costly medication, that requires more monitoring, her insurance requires this. She plans to call them back and update our office via [place].

## 2018-02-09 ENCOUNTER — Encounter (HOSPITAL_COMMUNITY)
Admission: RE | Admit: 2018-02-09 | Discharge: 2018-02-09 | Disposition: A | Discharge status: Home / Self Care | Payer: Self-pay | Source: Ambulatory Visit | Attending: Cardiovascular Disease | Admitting: Cardiovascular Disease

## 2018-02-10 ENCOUNTER — Telehealth: Payer: Self-pay | Admitting: Internal Medicine

## 2018-02-10 ENCOUNTER — Encounter (HOSPITAL_COMMUNITY)
Admission: RE | Admit: 2018-02-10 | Discharge: 2018-02-10 | Disposition: A | Discharge status: Home / Self Care | Payer: Self-pay | Source: Ambulatory Visit | Attending: Internal Medicine | Admitting: Internal Medicine

## 2018-02-10 NOTE — Telephone Encounter (Signed)
Received faxed notice from [place] that patient has been approved for Juxtapid from 01/09/18 - 02/08/19. I have emailed Wendall Papa RN with [facility] concerning processing prescription for patient. Patient has been notified via MyChart message along with memo that once medication is started, she will need monthly LFTs for 1 year.   Message routed to MD as [name]

## 2018-02-10 NOTE — Telephone Encounter (Signed)
That is great news! I hope she can tolerate it with regards to liver enzymes - I do expect it to lower her numbers.  Dr. Lemmie Evens

## 2018-02-11 ENCOUNTER — Encounter (HOSPITAL_COMMUNITY)
Admission: RE | Admit: 2018-02-11 | Discharge: 2018-02-11 | Disposition: A | Discharge status: Home / Self Care | Payer: Self-pay | Source: Ambulatory Visit | Attending: Cardiovascular Disease | Admitting: Cardiovascular Disease

## 2018-02-12 ENCOUNTER — Ambulatory Visit: Payer: Federal, State, Local not specified - PPO | Admitting: Internal Medicine

## 2018-02-12 ENCOUNTER — Encounter: Payer: Self-pay | Admitting: Internal Medicine

## 2018-02-12 VITALS — BP 106/74 | HR 68 | Ht 59.0 in | Wt 137.0 lb

## 2018-02-12 DIAGNOSIS — Z951 Presence of aortocoronary bypass graft: Secondary | ICD-10-CM | POA: Diagnosis not present

## 2018-02-12 DIAGNOSIS — E7849 Other hyperlipidemia: Secondary | ICD-10-CM | POA: Diagnosis not present

## 2018-02-12 DIAGNOSIS — I2510 Atherosclerotic heart disease of native coronary artery without angina pectoris: Secondary | ICD-10-CM | POA: Diagnosis not present

## 2018-02-12 DIAGNOSIS — E785 Hyperlipidemia, unspecified: Secondary | ICD-10-CM | POA: Diagnosis not present

## 2018-02-12 NOTE — Progress Notes (Signed)
OFFICE NOTE  Chief Complaint:  Follow-up dyslipidemia  Primary Care Physician: Lawerance Cruel, MD  HPI:  [name] is a 47 y.o. female with a past medial history significant for emergent single-vessel coronary artery bypass grafting with a 99% ostial LAD stenosis in February 2018.  She was not found to have any other significant obstructive coronary disease at the time.  Echo in May 2018 revealed an LVEF of 40-45% and an LAD wall motion abnormality.  He is a patient of Dr. [name].  She has an extensive history of dyslipidemia and was told by her uncle when she was a child in [place] that her cholesterol was very high and that she would likely need to be on treatment.  She is adopted and does not know her birth parents, but does have a daughter who is 63 years old.  She has not had cholesterol testing to her knowledge.  She had been started on statins, but reports significant intolerance, this is to high-dose atorvastatin, rosuvastatin, and possibly others that she cannot recall.  She was then referred to start [place].  Her lipid profile as of July 2018 showed total cholesterol 246, triglycerides 237, HDL 35 and LDL 164.  After starting therapy on Repatha, she received 4 doses, approximately 2 months of therapy and her repeat lipid profile showed a total cholesterol 265, triglycerides 220, LDL 176 and HDL of 45.  This is suggesting that she has had no response to the PCS canine inhibitor.  She reports compliance with the medication.  She had no side effects from it.  Possible reasons for this include a PCSK9 gain of function mutation or LDL receptor mutation.  Findings are possibly suggestive of familial hyperlipidemia although her LDL is moderately elevated.  Initially the plan was to consider switching her to Praluent and she has been provided samples, however, it is likely that she will have a similar poor response.  10/30/2017  [name] returns today for follow-up of dyslipidemia.  There is  clinical concern for familial hyperlipidemia and she does carry that diagnosis.  Unfortunately as previously mentioned she did not respond to PCSK9 inhibitor.  She did proceed with genetic testing after genetic counseling by Dr. Lattie Corns. 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  This finding is a pathogenic variant that does not allow the LDL receptor to fold properly and it is therefore poorly trafficked out of the golgi apparatus and rarely is expressed on the surface of the liver.  Based on this finding, it is understandable why a PCSK9 inhibitor which works by inhibiting the PCSK9 co-protein associated with surface LDL receptors would be ineffective as the total number of surface LDL receptors are extremely low.  We further discussed this mechanism and other strategies which may be helpful to lower her cholesterol.  She was again tried on pravastatin by the advanced heart failure clinic pharmacist, however had significant myalgias with this and may have some myopathy.  She says today that she is just now being able to straighten out the fingers on her hands.  At this point there are few additional options although she remains at high risk of recurrent events.  Her most recent cholesterol from several months ago showed total cholesterol 305, triglycerides 202, HDL 47 and LDL-C of 218.  Remaining options for her include LDL apheresis, for which she is likely a good candidate however is associated with significant cost and is cumbersome and would require her to likely  travel several times a month to either [facility] or [place].  The other option would be lomitapide.  By mechanism of inhibiting MTP, lomitapide should be effective at lowering her overall lipid profile by 30 to 40%, but is associated with significant GI side effects and liver toxicity.  This will need to be monitored closely and requires prescription by a provider who has completed the [facility]  program (which I am certified).  02/12/2018  [name] returns today for follow-up.  We have been working very diligently over the past several months to try to get her approval for lomitapide.  After multiple appeal she has finally been approved.  She indicates that she may be able to get this for no co-pay.  We will again need to repeat a lipid profile and liver enzymes as a baseline since is been 3 months since her last study and she would anticipate starting the medication next week.  We will need to monitor her liver enzymes very closely.  She is also made significant dietary changes at the direction of the nutritionist for [place] who is been working with her telephonically on the importance of changing her diet.  PMHx:  Past Medical History:  Diagnosis Date  . Anemia    due to heavy periods  . Anxiety   . Coronary artery disease   . Difficult intubation 01/22/2016  . Headache   . Heart murmur    aschild antibiotics if having Dental work  . Hypocholesteremia    medical treatment  . Ischemic cardiomyopathy   . Menorrhagia   . NSTEMI (non-ST elevated myocardial infarction) ([place])   . Pelvic pain     Past Surgical History:  Procedure Laterality Date  . ABDOMINAL HYSTERECTOMY    . CORONARY ARTERY BYPASS GRAFT N/A 07/12/2016   Procedure: CORONARY ARTERY BYPASS GRAFTING (CABG), ON PUMP, TIMES ONE, USING LEFT INTERNAL MAMMARY ARTERY WITH TEE;  Surgeon: Gaye Pollack, MD;  Location: [facility];  Service: Open Heart Surgery;  Laterality: N/A;  LIMA to LAD  . goiter     left side goiter removed  . INGUINAL HERNIA REPAIR Bilateral 01/22/2016   Procedure: LAPAROSCOPIC BILATERAL FEMORAL AND RIGHT INGUINAL HERNIA REPAIRWITH INSERTION OF MESH;  Surgeon: Michael Boston, MD;  Location: [facility];  Service: General;  Laterality: Bilateral;  . LEFT HEART CATH AND CORONARY ANGIOGRAPHY N/A 07/12/2016   Procedure: Left Heart Cath and Coronary Angiography;  Surgeon: Lorretta Harp, MD;  Location: [facility];  Service: Cardiovascular;  Laterality: N/A;  . SVD     x 1  . WISDOM TOOTH EXTRACTION      FAMHx:  Family History  Adopted: Yes    SOCHx:   reports that she has never smoked. She has never used smokeless tobacco. She reports that she does not drink alcohol or use drugs.  ALLERGIES:  Allergies  Allergen Reactions  . Statins Other (See Comments)    Myalgias - rousuvastatin, atorvastatin high dose  . Bactrim Itching and Rash         ROS: Pertinent items noted in HPI and remainder of comprehensive ROS otherwise negative.  HOME MEDS: Current Outpatient Medications on File Prior to Visit  Medication Sig Dispense Refill  . acetaminophen (TYLENOL) 325 MG tablet Take 325 mg by mouth every 6 (six) hours as needed.    Marland Kitchen aspirin 81 MG tablet Take 1 tablet (81 mg total) by mouth daily.    . Coenzyme Q10 (COQ10) 200 MG CAPS Take 200 mg  by mouth daily.     Marland Kitchen ezetimibe (ZETIA) 10 MG tablet Take 1 tablet (10 mg total) by mouth daily. 30 tablet 5  . losartan (COZAAR) 25 MG tablet TAKE 1 TABLET (25 MG TOTAL) BY MOUTH DAILY. 30 tablet 3  . metoprolol succinate (TOPROL-XL) 25 MG 24 hr tablet Take 0.5 tablets (12.5 mg total) by mouth daily. 15 tablet 5  . pravastatin (PRAVACHOL) 10 MG tablet Take 1 tablet (10 mg total) by mouth every other day. 15 tablet 6  . spironolactone (ALDACTONE) 25 MG tablet Take 1 tablet (25 mg total) by mouth daily. 30 tablet 5   No current facility-administered medications on file prior to visit.     LABS/IMAGING: No results found for this or any previous visit (from the past 48 hour(s)). No results found.  LIPID PANEL:    Component Value Date/Time   CHOL 369 (H) 10/30/2017 0905   TRIG 241 (H) 10/30/2017 0905   HDL 40 10/30/2017 0905   CHOLHDL 9.2 (H) 10/30/2017 0905   CHOLHDL 7.0 (H) 12/12/2016 0750   VLDL 47 (H) 12/12/2016 0750   LDLCALC 281 (H) 10/30/2017 0905     WEIGHTS: Wt Readings from Last 3 Encounters:  02/12/18 137 lb (62.1 kg)    01/15/18 138 lb (62.6 kg)  12/30/17 137 lb 8 oz (62.4 kg)    VITALS: BP 106/74 (BP Location: Left Arm, Patient Position: Sitting, Cuff Size: Normal)   Pulse 68   Ht 4\' 11"  (1.499 m)   Wt 137 lb (62.1 kg)   LMP 08/10/2011   BMI 27.67 kg/m   EXAM: deferred  EKG: Deferred  ASSESSMENT: 1. Coronary artery disease status post single-vessel CABG to the LAD 2. Ischemic cardiomyopathy, EF 40-45% 3. 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 Documented statin intolerance 5. PCSK9 nonresponder  PLAN: 1.   [name] fortunately was approved for lomitapide as she has been nonresponsive to both PCSK9 inhibitors and intolerant to statins.  I do think there is a good chance that this will lower her cholesterol however I am concerned about whether her liver enzymes will be stable.  We will need to follow those closely with monthly liver enzymes as per package insert.  Plan follow-up with me in a lipid profile in 3 months.  If she would be intolerant of this, ultimately she may need to be referred for lipoprotein apheresis.  Pixie Casino, MD, [facility], [place] Director of the Advanced Lipid Disorders &  Cardiovascular Risk Reduction Clinic Attending Cardiologist  Direct Dial: [phone]  Fax: [phone]  Website:  [email or web address] 02/12/2018, 9:51 AM

## 2018-02-12 NOTE — Telephone Encounter (Signed)
Patient seen in office today 02/12/18. She has gotten notice from [facility] that she can get Juxtapid for $0 copay. She will contact our office once she starts medication so that monthly LFT labs can begin

## 2018-02-12 NOTE — Patient Instructions (Addendum)
Your physician recommends that you return for lab work TODAY  Please call when you received Juxtapid - as we will need to set up monthly liver function lab testing  Your physician recommends that you schedule a follow-up appointment in [place] with Dr. [name] (lipid clinic) with fasting lab work prior to re-check cholesterol

## 2018-02-13 LAB — HEPATIC FUNCTION PANEL
ALT: 69 IU/L — AB (ref 0–32)
AST: 42 IU/L — ABNORMAL HIGH (ref 0–40)
Albumin: 4.6 g/dL (ref 3.5–5.5)
Alkaline Phosphatase: 71 IU/L (ref 39–117)
Bilirubin Total: 0.3 mg/dL (ref 0.0–1.2)
Bilirubin, Direct: 0.06 mg/dL (ref 0.00–0.40)
TOTAL PROTEIN: 7.5 g/dL (ref 6.0–8.5)

## 2018-02-13 LAB — LIPID PANEL
CHOLESTEROL TOTAL: 379 mg/dL — AB (ref 100–199)
Chol/HDL Ratio: 10.8 ratio — ABNORMAL HIGH (ref 0.0–4.4)
HDL: 35 mg/dL — ABNORMAL LOW (ref >39)
LDL Calculated: 284 mg/dL — ABNORMAL HIGH (ref 0–99)
Triglycerides: 302 mg/dL — ABNORMAL HIGH (ref 0–149)
VLDL CHOLESTEROL CAL: 60 mg/dL — AB (ref 5–40)

## 2018-02-16 ENCOUNTER — Encounter (HOSPITAL_COMMUNITY): Payer: Self-pay

## 2018-02-17 ENCOUNTER — Telehealth: Payer: Self-pay | Admitting: Internal Medicine

## 2018-02-17 DIAGNOSIS — Z79899 Other long term (current) drug therapy: Secondary | ICD-10-CM

## 2018-02-17 DIAGNOSIS — E7849 Other hyperlipidemia: Secondary | ICD-10-CM

## 2018-02-17 NOTE — Telephone Encounter (Signed)
New message:        Pt c/o medication issue:  1. Name of Medication: Juxtapid  2. How are you currently taking this medication (dosage and times per day)?   3. Are you having a reaction (difficulty breathing--STAT)? No  4. What is your medication issue? Mail Order pharmacy is calling on behalf of this pt and the pt has stated to them that they would like to take Vit E and Omega 369 with the medication above. They state they need an order for this to proceed and they would like to ship this out to the pt tomorrow.

## 2018-02-18 ENCOUNTER — Encounter (HOSPITAL_COMMUNITY): Payer: Self-pay

## 2018-02-18 ENCOUNTER — Telehealth: Payer: Self-pay | Admitting: Internal Medicine

## 2018-02-18 NOTE — Telephone Encounter (Signed)
New message   Pt c/o medication issue:  1. Name of Medication: Juxtapid  2. How are you currently taking this medication (dosage and times per day)? 5 mg daily at bedtime   3. Are you having a reaction (difficulty breathing--STAT)? no  4. What is your medication issue? Prescription dated 11/16/2017. Pharmacy is requesting a prescription or a call back to see  What can dispensed along with the medications. Supplements being requested is the Omega 369 and Vitamin E Supplement.

## 2018-02-18 NOTE — Telephone Encounter (Signed)
Called [facility] and notified them it is OK to dispense supplements with Juxtapid. They will notate this on patient's chart for the lifetime of the prescription.

## 2018-02-18 NOTE — Telephone Encounter (Signed)
Ok to order this - fat soluble vitamins can be depleted with Juxtapid (Vit ADEK - omega 3's).  Dr. Lemmie Evens

## 2018-02-18 NOTE — Telephone Encounter (Signed)
This is a duplicate message.

## 2018-02-18 NOTE — Telephone Encounter (Signed)
Patient sent MyChart message about supplements. Responded to her inquiry. Unsure if these are supplements she is currently taking or ones recommended to her.

## 2018-02-23 ENCOUNTER — Encounter (HOSPITAL_COMMUNITY): Payer: Self-pay

## 2018-02-23 DIAGNOSIS — I214 Non-ST elevation (NSTEMI) myocardial infarction: Secondary | ICD-10-CM | POA: Insufficient documentation

## 2018-02-23 DIAGNOSIS — Z951 Presence of aortocoronary bypass graft: Secondary | ICD-10-CM | POA: Insufficient documentation

## 2018-02-24 NOTE — Telephone Encounter (Signed)
Patient set to receive Juxtapid Rx this week. She plans to start soon. Orders placed for standing LFTs every month for the 1st year of treatment. Patient notified via MyChart message.

## 2018-02-24 NOTE — Addendum Note (Signed)
Addended by: Fidel Levy on: 02/24/2018 08:00 AM   Modules accepted: Orders

## 2018-02-25 ENCOUNTER — Encounter (HOSPITAL_COMMUNITY): Payer: Self-pay

## 2018-03-02 ENCOUNTER — Encounter (HOSPITAL_COMMUNITY): Payer: Self-pay

## 2018-03-04 ENCOUNTER — Encounter (HOSPITAL_COMMUNITY): Payer: Self-pay

## 2018-03-09 ENCOUNTER — Encounter (HOSPITAL_COMMUNITY): Payer: Self-pay

## 2018-03-10 ENCOUNTER — Other Ambulatory Visit (HOSPITAL_COMMUNITY): Payer: Self-pay | Admitting: Internal Medicine

## 2018-03-11 ENCOUNTER — Encounter (HOSPITAL_COMMUNITY): Payer: Self-pay

## 2018-03-16 ENCOUNTER — Encounter (HOSPITAL_COMMUNITY)
Admission: RE | Admit: 2018-03-16 | Discharge: 2018-03-16 | Disposition: A | Discharge status: Home / Self Care | Payer: Self-pay | Source: Ambulatory Visit | Attending: Cardiovascular Disease | Admitting: Cardiovascular Disease

## 2018-03-17 ENCOUNTER — Encounter (HOSPITAL_COMMUNITY)
Admission: RE | Admit: 2018-03-17 | Discharge: 2018-03-17 | Disposition: A | Discharge status: Home / Self Care | Payer: Self-pay | Source: Ambulatory Visit | Attending: Cardiovascular Disease | Admitting: Cardiovascular Disease

## 2018-03-18 ENCOUNTER — Encounter (HOSPITAL_COMMUNITY): Payer: Self-pay

## 2018-03-23 ENCOUNTER — Encounter (HOSPITAL_COMMUNITY)
Admission: RE | Admit: 2018-03-23 | Discharge: 2018-03-23 | Disposition: A | Discharge status: Home / Self Care | Payer: Self-pay | Source: Ambulatory Visit | Attending: Cardiovascular Disease | Admitting: Cardiovascular Disease

## 2018-03-24 ENCOUNTER — Encounter (HOSPITAL_COMMUNITY)
Admission: RE | Admit: 2018-03-24 | Discharge: 2018-03-24 | Disposition: A | Discharge status: Home / Self Care | Payer: Self-pay | Source: Ambulatory Visit | Attending: Cardiovascular Disease | Admitting: Cardiovascular Disease

## 2018-03-25 ENCOUNTER — Encounter (HOSPITAL_COMMUNITY)
Admission: RE | Admit: 2018-03-25 | Discharge: 2018-03-25 | Disposition: A | Discharge status: Home / Self Care | Payer: Self-pay | Source: Ambulatory Visit | Attending: Cardiovascular Disease | Admitting: Cardiovascular Disease

## 2018-03-30 ENCOUNTER — Encounter (HOSPITAL_COMMUNITY): Payer: Self-pay

## 2018-03-30 DIAGNOSIS — Z951 Presence of aortocoronary bypass graft: Secondary | ICD-10-CM | POA: Diagnosis not present

## 2018-03-30 DIAGNOSIS — Z79899 Other long term (current) drug therapy: Secondary | ICD-10-CM | POA: Diagnosis not present

## 2018-03-30 DIAGNOSIS — I2510 Atherosclerotic heart disease of native coronary artery without angina pectoris: Secondary | ICD-10-CM | POA: Insufficient documentation

## 2018-03-30 DIAGNOSIS — R748 Abnormal levels of other serum enzymes: Secondary | ICD-10-CM | POA: Diagnosis not present

## 2018-03-30 DIAGNOSIS — E7849 Other hyperlipidemia: Secondary | ICD-10-CM | POA: Diagnosis not present

## 2018-03-30 DIAGNOSIS — E785 Hyperlipidemia, unspecified: Secondary | ICD-10-CM | POA: Diagnosis not present

## 2018-03-30 LAB — LIPID PANEL
CHOL/HDL RATIO: 9.1 ratio — AB (ref 0.0–4.4)
CHOLESTEROL TOTAL: 274 mg/dL — AB (ref 100–199)
HDL: 30 mg/dL — ABNORMAL LOW (ref >39)
LDL CALC: 224 mg/dL — AB (ref 0–99)
Triglycerides: 100 mg/dL (ref 0–149)
VLDL Cholesterol Cal: 20 mg/dL (ref 5–40)

## 2018-03-31 ENCOUNTER — Encounter (HOSPITAL_COMMUNITY)
Admission: RE | Admit: 2018-03-31 | Discharge: 2018-03-31 | Disposition: A | Discharge status: Home / Self Care | Payer: Self-pay | Source: Ambulatory Visit | Attending: Cardiovascular Disease | Admitting: Cardiovascular Disease

## 2018-04-01 ENCOUNTER — Encounter: Payer: Self-pay | Admitting: Internal Medicine

## 2018-04-01 ENCOUNTER — Encounter (HOSPITAL_COMMUNITY)
Admission: RE | Admit: 2018-04-01 | Discharge: 2018-04-01 | Disposition: A | Discharge status: Home / Self Care | Payer: Self-pay | Source: Ambulatory Visit | Attending: Cardiovascular Disease | Admitting: Cardiovascular Disease

## 2018-04-01 DIAGNOSIS — R748 Abnormal levels of other serum enzymes: Secondary | ICD-10-CM | POA: Insufficient documentation

## 2018-04-02 LAB — HEPATIC FUNCTION PANEL
ALBUMIN: 4.6 g/dL (ref 3.5–5.5)
ALT: 79 IU/L — ABNORMAL HIGH (ref 0–32)
AST: 49 IU/L — AB (ref 0–40)
Alkaline Phosphatase: 59 IU/L (ref 39–117)
BILIRUBIN TOTAL: 0.5 mg/dL (ref 0.0–1.2)
BILIRUBIN, DIRECT: 0.13 mg/dL (ref 0.00–0.40)
Total Protein: 7.4 g/dL (ref 6.0–8.5)

## 2018-04-02 LAB — SPECIMEN STATUS REPORT

## 2018-04-06 ENCOUNTER — Encounter (HOSPITAL_COMMUNITY)
Admission: RE | Admit: 2018-04-06 | Discharge: 2018-04-06 | Disposition: A | Discharge status: Home / Self Care | Payer: Self-pay | Source: Ambulatory Visit | Attending: Cardiovascular Disease | Admitting: Cardiovascular Disease

## 2018-04-06 ENCOUNTER — Other Ambulatory Visit: Payer: Self-pay | Admitting: Cardiovascular Disease

## 2018-04-06 ENCOUNTER — Other Ambulatory Visit (HOSPITAL_COMMUNITY): Payer: Self-pay | Admitting: Internal Medicine

## 2018-04-06 NOTE — Telephone Encounter (Signed)
°*  STAT* If patient is at the pharmacy, call can be transferred to refill team.   1. Which medications need to be refilled? (please list name of each medication and dose if known)   spironolactone (ALDACTONE) 25 MG tablet   2. Which pharmacy/location (including street and city if local pharmacy) is medication to be sent to?  [facility], [place]  3. Do they need a 30 day or 90 day supply?   90 days

## 2018-04-08 ENCOUNTER — Encounter (HOSPITAL_COMMUNITY): Payer: Self-pay

## 2018-04-13 ENCOUNTER — Encounter (HOSPITAL_COMMUNITY): Payer: Self-pay

## 2018-04-14 ENCOUNTER — Encounter (HOSPITAL_COMMUNITY)
Admission: RE | Admit: 2018-04-14 | Discharge: 2018-04-14 | Disposition: A | Discharge status: Home / Self Care | Payer: Self-pay | Source: Ambulatory Visit | Attending: Cardiovascular Disease | Admitting: Cardiovascular Disease

## 2018-04-15 ENCOUNTER — Encounter (HOSPITAL_COMMUNITY)
Admission: RE | Admit: 2018-04-15 | Discharge: 2018-04-15 | Disposition: A | Discharge status: Home / Self Care | Payer: Self-pay | Source: Ambulatory Visit | Attending: Cardiovascular Disease | Admitting: Cardiovascular Disease

## 2018-04-15 ENCOUNTER — Other Ambulatory Visit: Payer: Self-pay | Admitting: *Deleted

## 2018-04-15 MED ORDER — SPIRONOLACTONE 25 MG PO TABS: 25.0000 mg | ORAL_TABLET | Freq: Every day | ORAL | 5 refills | Status: DC

## 2018-04-15 MED ORDER — LOSARTAN POTASSIUM 25 MG PO TABS: 25.0000 mg | ORAL_TABLET | Freq: Every day | ORAL | 5 refills | Status: DC

## 2018-04-15 MED ORDER — EZETIMIBE 10 MG PO TABS: 10.0000 mg | ORAL_TABLET | Freq: Every day | ORAL | 5 refills | Status: DC

## 2018-04-15 MED ORDER — METOPROLOL SUCCINATE ER 25 MG PO TB24: 12.5000 mg | ORAL_TABLET | Freq: Every day | ORAL | 5 refills | Status: DC

## 2018-04-15 NOTE — Telephone Encounter (Signed)
° ° °  Patient states she is out of medication. Patient states she no longer is a patient of Dr [name], requesting Dr [name] refill   1. Which medications need to be refilled? (please list name of each medication and dose if known) Losartan, Metoprolol, Zetia, Spironolactone  2. Which pharmacy/location (including street and city if local pharmacy) is medication to be sent to?[facility], [place]  3. Do they need a 30 day or 90 day supply? [place]

## 2018-04-20 ENCOUNTER — Encounter (HOSPITAL_COMMUNITY)
Admission: RE | Admit: 2018-04-20 | Discharge: 2018-04-20 | Disposition: A | Discharge status: Home / Self Care | Payer: Self-pay | Source: Ambulatory Visit | Attending: Cardiovascular Disease | Admitting: Cardiovascular Disease

## 2018-04-27 ENCOUNTER — Encounter (HOSPITAL_COMMUNITY)
Admission: RE | Admit: 2018-04-27 | Discharge: 2018-04-27 | Disposition: A | Discharge status: Home / Self Care | Payer: Self-pay | Source: Ambulatory Visit | Attending: Cardiovascular Disease | Admitting: Cardiovascular Disease

## 2018-04-27 DIAGNOSIS — I2510 Atherosclerotic heart disease of native coronary artery without angina pectoris: Secondary | ICD-10-CM | POA: Insufficient documentation

## 2018-04-28 ENCOUNTER — Encounter (HOSPITAL_COMMUNITY)
Admission: RE | Admit: 2018-04-28 | Discharge: 2018-04-28 | Disposition: A | Discharge status: Home / Self Care | Payer: Self-pay | Source: Ambulatory Visit | Attending: Cardiovascular Disease | Admitting: Cardiovascular Disease

## 2018-04-29 ENCOUNTER — Encounter (HOSPITAL_COMMUNITY)
Admission: RE | Admit: 2018-04-29 | Discharge: 2018-04-29 | Disposition: A | Discharge status: Home / Self Care | Payer: Self-pay | Source: Ambulatory Visit | Attending: Cardiovascular Disease | Admitting: Cardiovascular Disease

## 2018-05-04 ENCOUNTER — Encounter (HOSPITAL_COMMUNITY): Payer: Self-pay

## 2018-05-05 ENCOUNTER — Encounter (HOSPITAL_COMMUNITY)
Admission: RE | Admit: 2018-05-05 | Discharge: 2018-05-05 | Disposition: A | Discharge status: Home / Self Care | Payer: Self-pay | Source: Ambulatory Visit | Attending: Cardiovascular Disease | Admitting: Cardiovascular Disease

## 2018-05-06 ENCOUNTER — Encounter (HOSPITAL_COMMUNITY): Payer: Self-pay

## 2018-05-06 LAB — HEPATIC FUNCTION PANEL
ALK PHOS: 69 IU/L (ref 39–117)
ALT: 155 IU/L — ABNORMAL HIGH (ref 0–32)
AST: 65 IU/L — AB (ref 0–40)
Albumin: 4.7 g/dL (ref 3.5–5.5)
BILIRUBIN TOTAL: 0.5 mg/dL (ref 0.0–1.2)
Bilirubin, Direct: 0.12 mg/dL (ref 0.00–0.40)
TOTAL PROTEIN: 7.2 g/dL (ref 6.0–8.5)

## 2018-05-11 ENCOUNTER — Encounter: Payer: Self-pay | Admitting: Internal Medicine

## 2018-05-11 ENCOUNTER — Ambulatory Visit: Payer: Federal, State, Local not specified - PPO | Admitting: Internal Medicine

## 2018-05-11 ENCOUNTER — Encounter (HOSPITAL_COMMUNITY): Payer: Self-pay

## 2018-05-11 VITALS — BP 112/72 | HR 56 | Ht 59.0 in | Wt 127.0 lb

## 2018-05-11 DIAGNOSIS — Z951 Presence of aortocoronary bypass graft: Secondary | ICD-10-CM

## 2018-05-11 DIAGNOSIS — E7849 Other hyperlipidemia: Secondary | ICD-10-CM | POA: Diagnosis not present

## 2018-05-11 NOTE — Patient Instructions (Addendum)
Medication Instructions:  Dr. [name] recommends that your AVOID tylenol OK to take ibuprofen or aleve as directed If you need a refill on your cardiac medications before your next appointment, please call your pharmacy.   Lab work: LIVER ENZYMES in 1 month (around Jan 12) If you have labs (blood work) drawn today and your tests are completely normal, you will receive your results only by: Marland Kitchen MyChart Message (if you have MyChart) OR . A paper copy in the mail If you have any lab test that is abnormal or we need to change your treatment, we will call you to review the results.  Follow-Up: At [facility], you and your health needs are our priority.  As part of our continuing mission to provide you with exceptional heart care, we have created designated Provider Care Teams.  These Care Teams include your primary Cardiologist (physician) and Advanced Practice Providers (APPs -  Physician Assistants and Nurse Practitioners) who all work together to provide you with the care you need, when you need it. You will need a follow up appointment in 3 months with Dr. [name] ([place])

## 2018-05-13 ENCOUNTER — Encounter (HOSPITAL_COMMUNITY): Payer: Self-pay

## 2018-05-13 ENCOUNTER — Encounter: Payer: Self-pay | Admitting: Internal Medicine

## 2018-05-13 NOTE — Progress Notes (Signed)
OFFICE NOTE  Chief Complaint:  Follow-up dyslipidemia  Primary Care Physician: Lawerance Cruel, MD  HPI:  [name] is a 47 y.o. female with a past medial history significant for emergent single-vessel coronary artery bypass grafting with a 99% ostial LAD stenosis in February 2018.  She was not found to have any other significant obstructive coronary disease at the time.  Echo in May 2018 revealed an LVEF of 40-45% and an LAD wall motion abnormality.  He is a patient of Dr. [name].  She has an extensive history of dyslipidemia and was told by her uncle when she was a child in [place] that her cholesterol was very high and that she would likely need to be on treatment.  She is adopted and does not know her birth parents, but does have a daughter who is 63 years old.  She has not had cholesterol testing to her knowledge.  She had been started on statins, but reports significant intolerance, this is to high-dose atorvastatin, rosuvastatin, and possibly others that she cannot recall.  She was then referred to start [place].  Her lipid profile as of July 2018 showed total cholesterol 246, triglycerides 237, HDL 35 and LDL 164.  After starting therapy on Repatha, she received 4 doses, approximately 2 months of therapy and her repeat lipid profile showed a total cholesterol 265, triglycerides 220, LDL 176 and HDL of 45.  This is suggesting that she has had no response to the PCS canine inhibitor.  She reports compliance with the medication.  She had no side effects from it.  Possible reasons for this include a PCSK9 gain of function mutation or LDL receptor mutation.  Findings are possibly suggestive of familial hyperlipidemia although her LDL is moderately elevated.  Initially the plan was to consider switching her to Praluent and she has been provided samples, however, it is likely that she will have a similar poor response.  10/30/2017  [name] returns today for follow-up of dyslipidemia.  There is  clinical concern for familial hyperlipidemia and she does carry that diagnosis.  Unfortunately as previously mentioned she did not respond to PCSK9 inhibitor.  She did proceed with genetic testing after genetic counseling by Dr. Lattie Corns. 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  This finding is a pathogenic variant that does not allow the LDL receptor to fold properly and it is therefore poorly trafficked out of the golgi apparatus and rarely is expressed on the surface of the liver.  Based on this finding, it is understandable why a PCSK9 inhibitor which works by inhibiting the PCSK9 co-protein associated with surface LDL receptors would be ineffective as the total number of surface LDL receptors are extremely low.  We further discussed this mechanism and other strategies which may be helpful to lower her cholesterol.  She was again tried on pravastatin by the advanced heart failure clinic pharmacist, however had significant myalgias with this and may have some myopathy.  She says today that she is just now being able to straighten out the fingers on her hands.  At this point there are few additional options although she remains at high risk of recurrent events.  Her most recent cholesterol from several months ago showed total cholesterol 305, triglycerides 202, HDL 47 and LDL-C of 218.  Remaining options for her include LDL apheresis, for which she is likely a good candidate however is associated with significant cost and is cumbersome and would require her to likely  travel several times a month to either [facility] or [place].  The other option would be lomitapide.  By mechanism of inhibiting MTP, lomitapide should be effective at lowering her overall lipid profile by 30 to 40%, but is associated with significant GI side effects and liver toxicity.  This will need to be monitored closely and requires prescription by a provider who has completed the REMS  program (which I am certified).  02/12/2018  [name] returns today for follow-up.  We have been working very diligently over the past several months to try to get her approval for lomitapide.  After multiple appeal she has finally been approved.  She indicates that she may be able to get this for no co-pay.  We will again need to repeat a lipid profile and liver enzymes as a baseline since is been 3 months since her last study and she would anticipate starting the medication next week.  We will need to monitor her liver enzymes very closely.  She is also made significant dietary changes at the direction of the nutritionist for [facility] who is been working with her telephonically on the importance of changing her diet.  05/11/2018  [name] returns today for follow-up.  She seems to be tolerating Juxtapid, but does have some issues with joint pain which is been a longstanding problem.  We are adhering to the rems program, and she is having monthly liver enzyme tests.  She has known steatohepatitis and has had elevated liver enzymes in the past.  I am pleased to report her lipids have improved.  Her total cholesterol came down from 379-274 with triglycerides down from 300-200 and LDL from 284 to 224.  Unfortunately her liver enzymes are climbing.  Over the past 6 months her ALT is climbed from 54 to 155 and AST from 34 to 65.  The ALT is now about 5 times upper limit normal.  I discussed this with her at this point and concerned that she may not be able to tolerate the medication.  Guidelines for the manufacturer suggest decreasing the dose however she is on the lowest dose.  I discussed this further with our pharmacist Tana Coast, Pharm.D., who reviewed the pharmacokinetics of the drug and given its longer half-life of close to 48 hours felt that the drug could be effectively dosed every other day and perhaps still maintain steady state while reducing the possibility of side effects.  In addition, [name] suffers  from arthritic pain and recently has had an upper respiratory infection which worsened her symptoms.  She has been taking Tylenol fairly regularly for this since she was once told not to take nonsteroidals due to her heart disease.  She did not realize that the Tylenol could worsen her liver enzyme abnormalities.  PMHx:  Past Medical History:  Diagnosis Date  . Anemia    due to heavy periods  . Anxiety   . Coronary artery disease   . Difficult intubation 01/22/2016  . Headache   . Heart murmur    aschild antibiotics if having Dental work  . Hypocholesteremia    medical treatment  . Ischemic cardiomyopathy   . Menorrhagia   . NSTEMI (non-ST elevated myocardial infarction) ([place])   . Pelvic pain     Past Surgical History:  Procedure Laterality Date  . ABDOMINAL HYSTERECTOMY    . CORONARY ARTERY BYPASS GRAFT N/A 07/12/2016   Procedure: CORONARY ARTERY BYPASS GRAFTING (CABG), ON PUMP, TIMES ONE, USING LEFT INTERNAL MAMMARY ARTERY WITH TEE;  Surgeon: Gaye Pollack, MD;  Location: [facility];  Service: Open Heart Surgery;  Laterality: N/A;  LIMA to LAD  . goiter     left side goiter removed  . INGUINAL HERNIA REPAIR Bilateral 01/22/2016   Procedure: LAPAROSCOPIC BILATERAL FEMORAL AND RIGHT INGUINAL HERNIA REPAIRWITH INSERTION OF MESH;  Surgeon: Michael Boston, MD;  Location: [facility];  Service: General;  Laterality: Bilateral;  . LEFT HEART CATH AND CORONARY ANGIOGRAPHY N/A 07/12/2016   Procedure: Left Heart Cath and Coronary Angiography;  Surgeon: Lorretta Harp, MD;  Location: [facility];  Service: Cardiovascular;  Laterality: N/A;  . SVD     x 1  . WISDOM TOOTH EXTRACTION      FAMHx:  Family History  Adopted: Yes    SOCHx:   reports that she has never smoked. She has never used smokeless tobacco. She reports that she does not drink alcohol or use drugs.  ALLERGIES:  Allergies  Allergen Reactions  . Statins Other (See Comments)    Myalgias - rousuvastatin, atorvastatin high  dose  . Bactrim Itching and Rash         ROS: Pertinent items noted in HPI and remainder of comprehensive ROS otherwise negative.  HOME MEDS: Current Outpatient Medications on File Prior to Visit  Medication Sig Dispense Refill  . aspirin 81 MG tablet Take 1 tablet (81 mg total) by mouth daily.    . Coenzyme Q10 (COQ10) 200 MG CAPS Take 200 mg by mouth daily.     Marland Kitchen ezetimibe (ZETIA) 10 MG tablet Take 1 tablet (10 mg total) by mouth daily. 30 tablet 5  . Lomitapide Mesylate 5 MG CAPS Take 1 capsule by mouth daily.    Marland Kitchen losartan (COZAAR) 25 MG tablet Take 1 tablet (25 mg total) by mouth daily. 30 tablet 5  . metoprolol succinate (TOPROL-XL) 25 MG 24 hr tablet Take 0.5 tablets (12.5 mg total) by mouth daily. 15 tablet 5  . spironolactone (ALDACTONE) 25 MG tablet Take 1 tablet (25 mg total) by mouth daily. 30 tablet 5   No current facility-administered medications on file prior to visit.     LABS/IMAGING: No results found for this or any previous visit (from the past 48 hour(s)). No results found.  LIPID PANEL:    Component Value Date/Time   CHOL 274 (H) 03/30/2018 1153   TRIG 100 03/30/2018 1153   HDL 30 (L) 03/30/2018 1153   CHOLHDL 9.1 (H) 03/30/2018 1153   CHOLHDL 7.0 (H) 12/12/2016 0750   VLDL 47 (H) 12/12/2016 0750   LDLCALC 224 (H) 03/30/2018 1153     WEIGHTS: Wt Readings from Last 3 Encounters:  05/11/18 127 lb (57.6 kg)  02/12/18 137 lb (62.1 kg)  01/15/18 138 lb (62.6 kg)    VITALS: BP 112/72   [name] (!) 56   Ht 4\' 11"  (1.499 m)   Wt 127 lb (57.6 kg)   LMP 08/10/2011   BMI 25.65 kg/m   EXAM: General appearance: alert and no distress Neck: no carotid bruit, no JVD and thyroid not enlarged, symmetric, no tenderness/mass/nodules Lungs: clear to auscultation bilaterally Heart: regular rate and rhythm, S1, S2 normal, no murmur, click, rub or gallop Abdomen: soft, non-tender; bowel sounds normal; no masses,  no organomegaly Extremities: extremities normal,  atraumatic, no cyanosis or edema Pulses: 2+ and symmetric Skin: Skin color, texture, turgor normal. No rashes or lesions Neurologic: Grossly normal Psych: Pleasant  EKG: Deferred  ASSESSMENT: 1. Coronary artery disease status post single-vessel CABG  to the LAD 2. Ischemic cardiomyopathy, EF 40-45% 3. 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 Documented statin intolerance 5. PCSK9 partial responder -on Juxtapid 6. Hepatic steatosis-elevated liver enzymes  PLAN: 1.   Mrs. Cadogan has a history of hepatic steatosis and elevated liver enzymes.  Not surprisingly on Juxtapid her liver enzymes are elevating.  This was found in about 90% of patients up to 3 times the upper limit of normal and about 16% of the patients up to 5 times upper limit of normal.  Package insert indicate decreasing the dose however she is currently on the lowest possible dose.  We may need to consider every other day dosing which is at least pharmacologically sound.  It is unclear whether this will be tolerated by her liver.  She will absolutely need to stop Tylenol and other products including over-the-counter medications that may contain Tylenol due to the risk of additional liver toxicity.  Plan scheduled repeat liver enzymes next month.  If her enzymes stabilize, no dosing changes will be made.  Otherwise, will consider decreasing to every other day dosing.  We may ultimately have to discontinue the medication.  It should be noted that about a year ago her LDL cholesterol was as low as 164, at a time when she was on Praluent.  Her response was much less than expected however she did have some response.  We may need to consider going back to that as it was well-tolerated without any significant enzyme abnormalities.  Finally, it is not clear whether she had ever tried Livalo.  This is more extensively glucagon related and better tolerated in the setting of elevated liver enzymes.  We may need to consider  reaching to the statin for some additional benefit.  Follow-up in 3 months.  Pixie Casino, MD, [facility], [place] Director of the Advanced Lipid Disorders &  Cardiovascular Risk Reduction Clinic Attending Cardiologist  Direct Dial: [phone]  Fax: [phone]  Website:  [email or web address] 05/13/2018, 3:53 PM

## 2018-05-18 ENCOUNTER — Encounter (HOSPITAL_COMMUNITY)
Admission: RE | Admit: 2018-05-18 | Discharge: 2018-05-18 | Disposition: A | Discharge status: Home / Self Care | Payer: Self-pay | Source: Ambulatory Visit | Attending: Cardiovascular Disease | Admitting: Cardiovascular Disease

## 2018-05-20 ENCOUNTER — Encounter (HOSPITAL_COMMUNITY): Payer: Self-pay

## 2018-05-25 ENCOUNTER — Encounter (HOSPITAL_COMMUNITY)
Admission: RE | Admit: 2018-05-25 | Discharge: 2018-05-25 | Disposition: A | Discharge status: Home / Self Care | Payer: Self-pay | Source: Ambulatory Visit | Attending: Cardiovascular Disease | Admitting: Cardiovascular Disease

## 2018-05-27 ENCOUNTER — Encounter (HOSPITAL_COMMUNITY): Payer: Self-pay

## 2018-05-27 DIAGNOSIS — I2510 Atherosclerotic heart disease of native coronary artery without angina pectoris: Secondary | ICD-10-CM | POA: Insufficient documentation

## 2018-06-01 ENCOUNTER — Encounter (HOSPITAL_COMMUNITY): Payer: Self-pay

## 2018-06-03 ENCOUNTER — Encounter (HOSPITAL_COMMUNITY): Payer: Self-pay

## 2018-06-08 ENCOUNTER — Encounter (HOSPITAL_COMMUNITY): Payer: Self-pay

## 2018-06-10 ENCOUNTER — Encounter (HOSPITAL_COMMUNITY): Payer: Self-pay

## 2018-06-11 ENCOUNTER — Other Ambulatory Visit: Payer: Self-pay

## 2018-06-11 DIAGNOSIS — R748 Abnormal levels of other serum enzymes: Secondary | ICD-10-CM | POA: Diagnosis not present

## 2018-06-12 LAB — HEPATIC FUNCTION PANEL
ALBUMIN: 4.2 g/dL (ref 3.5–5.5)
ALK PHOS: 63 IU/L (ref 39–117)
ALT: 77 IU/L — ABNORMAL HIGH (ref 0–32)
AST: 46 IU/L — AB (ref 0–40)
BILIRUBIN, DIRECT: 0.1 mg/dL (ref 0.00–0.40)
Bilirubin Total: 0.4 mg/dL (ref 0.0–1.2)
Total Protein: 6.8 g/dL (ref 6.0–8.5)

## 2018-06-15 ENCOUNTER — Encounter (HOSPITAL_COMMUNITY)
Admission: RE | Admit: 2018-06-15 | Discharge: 2018-06-15 | Disposition: A | Discharge status: Home / Self Care | Payer: Self-pay | Source: Ambulatory Visit | Attending: Cardiovascular Disease | Admitting: Cardiovascular Disease

## 2018-06-16 ENCOUNTER — Encounter (HOSPITAL_COMMUNITY)
Admission: RE | Admit: 2018-06-16 | Discharge: 2018-06-16 | Disposition: A | Discharge status: Home / Self Care | Payer: Self-pay | Source: Ambulatory Visit | Attending: Cardiovascular Disease | Admitting: Cardiovascular Disease

## 2018-06-17 ENCOUNTER — Encounter (HOSPITAL_COMMUNITY): Payer: Self-pay

## 2018-06-18 ENCOUNTER — Telehealth: Payer: Self-pay | Admitting: Internal Medicine

## 2018-06-18 NOTE — Telephone Encounter (Signed)
[place] ([name]) at [phone] to initiate PA for Praluent 150mg /mL per MD. Was notified this does not require a PA but this is NOT on member's formulary.   Was transferred to pharmacy staff to learn what was on formulary/alternatives which are Repatha 140mg /mL and Repatha 420mg /3.41mL  Routed to MD to determine next steps - pursue formulary exception and Rx Praluent or retrial with Repatha

## 2018-06-21 NOTE — Telephone Encounter (Signed)
I would go ahead and pursue Repatha then - I think the results may be similar with both and it may be cheaper. Also, please add LP(a) to next bloodwork - we have not assessed this and it could be another reason her LDL has not responded fully to therapy.  Dr. Lemmie Evens

## 2018-06-22 ENCOUNTER — Encounter (HOSPITAL_COMMUNITY)
Admission: RE | Admit: 2018-06-22 | Discharge: 2018-06-22 | Disposition: A | Discharge status: Home / Self Care | Payer: Self-pay | Source: Ambulatory Visit | Attending: Cardiovascular Disease | Admitting: Cardiovascular Disease

## 2018-06-22 NOTE — Telephone Encounter (Signed)
Called [employer] FEP program to initiate PA for Repatha Sureclick 140mg /mL q2weeks  Proceeded with PA process over the phone CAD, CABG, FH, statin-intolerance LDL-C >70mg /dL in last 90 days - Y Myalgias on rosuvastatin & atorvastatin   After 35 minutes on phone, was notified that patient is DENIED Repatha as she is currently on Juxtapid - not medically necessary (?). The member can file for an appeal with a written letter & MD notes, labs, documentation can be faxed to [phone] for appeal process once patient has composed/signed letter. Our office will receive fax concerning PA outcome and patient will receive letter with PA outcome and instructions on appeals process.   Patient notified via MyChart Routed to MD

## 2018-06-22 NOTE — Telephone Encounter (Signed)
MyChart message sent to patient with MD recommendation about Repatha over Praluent.

## 2018-06-22 NOTE — Telephone Encounter (Addendum)
Called [employer] FEP program to initiate PA for Repatha Sureclick 140mg /mL q2weeks

## 2018-06-23 ENCOUNTER — Encounter (HOSPITAL_COMMUNITY)
Admission: RE | Admit: 2018-06-23 | Discharge: 2018-06-23 | Disposition: A | Discharge status: Home / Self Care | Payer: Self-pay | Source: Ambulatory Visit | Attending: Cardiovascular Disease | Admitting: Cardiovascular Disease

## 2018-06-23 NOTE — Telephone Encounter (Signed)
Send patient a draft of an appeal initiation letter via MyChart so that she can send to [facility] FEP once denial info is received.

## 2018-06-23 NOTE — Telephone Encounter (Signed)
Thanks . Marland Kitchen I would add that she was previously tried on Repatha and did have a reduction in LDL, however, suboptimal. Likely due to LDL receptor mutation or possibly high LP(a)- pending. Also, if denied, I would pursue Praluent since it was covered previously.  Dr. Lemmie Evens

## 2018-06-24 ENCOUNTER — Encounter (HOSPITAL_COMMUNITY): Payer: Self-pay

## 2018-06-29 ENCOUNTER — Encounter (HOSPITAL_COMMUNITY)
Admission: RE | Admit: 2018-06-29 | Discharge: 2018-06-29 | Disposition: A | Discharge status: Home / Self Care | Payer: Self-pay | Source: Ambulatory Visit | Attending: Cardiovascular Disease | Admitting: Cardiovascular Disease

## 2018-06-29 DIAGNOSIS — I2510 Atherosclerotic heart disease of native coronary artery without angina pectoris: Secondary | ICD-10-CM | POA: Insufficient documentation

## 2018-07-01 ENCOUNTER — Encounter (HOSPITAL_COMMUNITY)
Admission: RE | Admit: 2018-07-01 | Discharge: 2018-07-01 | Disposition: A | Discharge status: Home / Self Care | Payer: Self-pay | Source: Ambulatory Visit | Attending: Cardiovascular Disease | Admitting: Cardiovascular Disease

## 2018-07-06 ENCOUNTER — Encounter (HOSPITAL_COMMUNITY)
Admission: RE | Admit: 2018-07-06 | Discharge: 2018-07-06 | Disposition: A | Discharge status: Home / Self Care | Payer: Self-pay | Source: Ambulatory Visit | Attending: Cardiovascular Disease | Admitting: Cardiovascular Disease

## 2018-07-08 ENCOUNTER — Other Ambulatory Visit: Payer: Self-pay

## 2018-07-08 ENCOUNTER — Encounter (HOSPITAL_COMMUNITY)
Admission: RE | Admit: 2018-07-08 | Discharge: 2018-07-08 | Disposition: A | Discharge status: Home / Self Care | Payer: Self-pay | Source: Ambulatory Visit | Attending: Cardiovascular Disease | Admitting: Cardiovascular Disease

## 2018-07-08 DIAGNOSIS — E7849 Other hyperlipidemia: Secondary | ICD-10-CM

## 2018-07-08 DIAGNOSIS — Z79899 Other long term (current) drug therapy: Secondary | ICD-10-CM | POA: Diagnosis not present

## 2018-07-08 LAB — HEPATIC FUNCTION PANEL
ALT: 123 IU/L — ABNORMAL HIGH (ref 0–32)
AST: 67 IU/L — ABNORMAL HIGH (ref 0–40)
Albumin: 4.7 g/dL (ref 3.8–4.8)
Alkaline Phosphatase: 70 IU/L (ref 39–117)
BILIRUBIN TOTAL: 0.5 mg/dL (ref 0.0–1.2)
BILIRUBIN, DIRECT: 0.11 mg/dL (ref 0.00–0.40)
TOTAL PROTEIN: 7.1 g/dL (ref 6.0–8.5)

## 2018-07-13 ENCOUNTER — Encounter (HOSPITAL_COMMUNITY)
Admission: RE | Admit: 2018-07-13 | Discharge: 2018-07-13 | Disposition: A | Discharge status: Home / Self Care | Payer: Self-pay | Source: Ambulatory Visit | Attending: Cardiovascular Disease | Admitting: Cardiovascular Disease

## 2018-07-14 ENCOUNTER — Encounter (HOSPITAL_COMMUNITY)
Admission: RE | Admit: 2018-07-14 | Discharge: 2018-07-14 | Disposition: A | Discharge status: Home / Self Care | Payer: Self-pay | Source: Ambulatory Visit | Attending: Cardiovascular Disease | Admitting: Cardiovascular Disease

## 2018-07-15 ENCOUNTER — Encounter (HOSPITAL_COMMUNITY)
Admission: RE | Admit: 2018-07-15 | Discharge: 2018-07-15 | Disposition: A | Discharge status: Home / Self Care | Payer: Self-pay | Source: Ambulatory Visit | Attending: Cardiovascular Disease | Admitting: Cardiovascular Disease

## 2018-07-20 ENCOUNTER — Encounter (HOSPITAL_COMMUNITY): Payer: Self-pay

## 2018-07-21 ENCOUNTER — Encounter (HOSPITAL_COMMUNITY)
Admission: RE | Admit: 2018-07-21 | Discharge: 2018-07-21 | Disposition: A | Discharge status: Home / Self Care | Payer: Self-pay | Source: Ambulatory Visit | Attending: Cardiovascular Disease | Admitting: Cardiovascular Disease

## 2018-07-22 ENCOUNTER — Encounter (HOSPITAL_COMMUNITY)
Admission: RE | Admit: 2018-07-22 | Discharge: 2018-07-22 | Disposition: A | Discharge status: Home / Self Care | Payer: Self-pay | Source: Ambulatory Visit | Attending: Cardiovascular Disease | Admitting: Cardiovascular Disease

## 2018-07-27 ENCOUNTER — Encounter (HOSPITAL_COMMUNITY): Payer: Self-pay | Attending: Cardiovascular Disease

## 2018-07-27 DIAGNOSIS — I2510 Atherosclerotic heart disease of native coronary artery without angina pectoris: Secondary | ICD-10-CM | POA: Insufficient documentation

## 2018-07-29 ENCOUNTER — Encounter (HOSPITAL_COMMUNITY): Payer: Self-pay

## 2018-08-03 ENCOUNTER — Encounter (HOSPITAL_COMMUNITY): Payer: Self-pay

## 2018-08-05 ENCOUNTER — Encounter (HOSPITAL_COMMUNITY): Payer: Self-pay

## 2018-08-09 ENCOUNTER — Telehealth (HOSPITAL_COMMUNITY): Payer: Self-pay

## 2018-08-09 NOTE — Telephone Encounter (Signed)
Phone call to patient communicated that cardiac rehab will be closing for 2 weeks d/t the corona virus. Pt verbalized understanding.  

## 2018-08-10 ENCOUNTER — Telehealth: Payer: Self-pay | Admitting: *Deleted

## 2018-08-10 ENCOUNTER — Encounter (HOSPITAL_COMMUNITY): Payer: Self-pay

## 2018-08-10 ENCOUNTER — Other Ambulatory Visit: Payer: Self-pay | Admitting: *Deleted

## 2018-08-10 DIAGNOSIS — Z79899 Other long term (current) drug therapy: Secondary | ICD-10-CM

## 2018-08-10 DIAGNOSIS — R7989 Other specified abnormal findings of blood chemistry: Secondary | ICD-10-CM

## 2018-08-10 DIAGNOSIS — R945 Abnormal results of liver function studies: Principal | ICD-10-CM

## 2018-08-10 DIAGNOSIS — Z5181 Encounter for therapeutic drug level monitoring: Secondary | ICD-10-CM

## 2018-08-10 DIAGNOSIS — E7849 Other hyperlipidemia: Secondary | ICD-10-CM

## 2018-08-10 NOTE — Telephone Encounter (Signed)
Patient in office for labs, standing order for liver functions ordered

## 2018-08-11 DIAGNOSIS — M79644 Pain in right finger(s): Secondary | ICD-10-CM | POA: Diagnosis not present

## 2018-08-11 DIAGNOSIS — M65311 Trigger thumb, right thumb: Secondary | ICD-10-CM | POA: Diagnosis not present

## 2018-08-11 LAB — HEPATIC FUNCTION PANEL
ALT: 128 IU/L — AB (ref 0–32)
AST: 77 IU/L — AB (ref 0–40)
Albumin: 4.6 g/dL (ref 3.8–4.8)
Alkaline Phosphatase: 77 IU/L (ref 39–117)
BILIRUBIN TOTAL: 0.4 mg/dL (ref 0.0–1.2)
Bilirubin, Direct: 0.1 mg/dL (ref 0.00–0.40)
Total Protein: 6.9 g/dL (ref 6.0–8.5)

## 2018-08-12 ENCOUNTER — Telehealth: Payer: Self-pay | Admitting: *Deleted

## 2018-08-12 ENCOUNTER — Encounter (HOSPITAL_COMMUNITY): Payer: Self-pay

## 2018-08-12 DIAGNOSIS — E785 Hyperlipidemia, unspecified: Secondary | ICD-10-CM

## 2018-08-12 DIAGNOSIS — M65311 Trigger thumb, right thumb: Secondary | ICD-10-CM | POA: Insufficient documentation

## 2018-08-12 NOTE — Telephone Encounter (Signed)
PATIENT HAD QUESTION IN REGARDS TO HER APPOINTMENT. MESSAGE SENT TO YOU FOR FURTHER REVIEW.  RN DID ORDER  LAB-LIPID FOR PATIENT.

## 2018-08-12 NOTE — Telephone Encounter (Signed)
CEL PHONE MAIL BOX IS FULL UNABLE TO LEAVE MESSAGE AND HOME ,WORK PHONE - SENT MESSAGE BY [facility]       Primary Cardiologist:  No primary care provider on file.  DR HILTY- LIPID CLINIC  Patient contacted.  History reviewed.  No symptoms to suggest any unstable cardiac conditions.  Based on discussion, with current pandemic situation, we will be postponing this appointment for MS T[place].  If symptoms change, she has been instructed to contact our office.  Routing to C19 CANCEL pool for tracking (P CV DIV CV19 CANCEL).  Raiford Simmonds, RN  08/12/2018 11:22 AM         .

## 2018-08-16 ENCOUNTER — Telehealth: Payer: Self-pay | Admitting: Internal Medicine

## 2018-08-16 NOTE — Telephone Encounter (Signed)
.  hcc

## 2018-08-16 NOTE — Telephone Encounter (Signed)
    Primary Cardiologist:  Quay Burow, MD   Patient contacted.  History reviewed.  No symptoms to suggest any unstable cardiac conditions.  Based on discussion, with current pandemic situation, we will be postponing this appointment for @NAME @.  If symptoms change, she has been instructed to contact our office.   Routing to C19 CANCEL pool for tracking (P CV DIV CV19 CANCEL) and assigning priority (1 = 4-6 wks, 2 = 6-12 wks, 3 = >12 wks).  Pixie Casino, MD  08/16/2018 11:14 AM         .

## 2018-08-16 NOTE — Telephone Encounter (Signed)
ENTERED IN ERROR

## 2018-08-17 ENCOUNTER — Telehealth (HOSPITAL_COMMUNITY): Payer: Self-pay | Admitting: Family Medicine

## 2018-08-17 ENCOUNTER — Ambulatory Visit: Payer: Federal, State, Local not specified - PPO | Admitting: Internal Medicine

## 2018-08-17 ENCOUNTER — Encounter (HOSPITAL_COMMUNITY): Payer: Self-pay

## 2018-08-19 ENCOUNTER — Encounter (HOSPITAL_COMMUNITY): Payer: Self-pay

## 2018-08-20 NOTE — Telephone Encounter (Signed)
Dr. [name] - I will email you a copy of the letter.  Sounds like you'll need to write something explaining need for Repatha with Juxtapid, as her next appt with you has been cancelled for now.  Let me know what I can do to help.  I'll be working from home all next week

## 2018-08-23 ENCOUNTER — Other Ambulatory Visit: Payer: Self-pay

## 2018-08-24 ENCOUNTER — Encounter (HOSPITAL_COMMUNITY): Payer: Self-pay

## 2018-08-25 ENCOUNTER — Telehealth: Payer: Self-pay | Admitting: Internal Medicine

## 2018-08-25 NOTE — Telephone Encounter (Signed)
Returned call to pharmacy - patient requesting OTC fish oil and vitamin E.  They need our permission to send with the [facility].  No drug interactions, ok for them to send.

## 2018-08-25 NOTE — Telephone Encounter (Signed)
[facility] is requesting authorization to dispense omega-3 and vitamin E along with Juxtapid, this medication is not listed on pt's medication list. Pharmacy would like a call back at [phone]. Please address

## 2018-08-26 ENCOUNTER — Encounter (HOSPITAL_COMMUNITY): Payer: Self-pay

## 2018-08-27 NOTE — Telephone Encounter (Signed)
Thanks - Dr H 

## 2018-08-30 ENCOUNTER — Telehealth: Payer: Self-pay | Admitting: Internal Medicine

## 2018-08-30 NOTE — Telephone Encounter (Signed)
I am assuming the call was to reschedule her appointment that was cancelled to a virtual visit. Attempted to contact pt. Unable to leave message as mailbox is full.

## 2018-08-30 NOTE — Telephone Encounter (Signed)
Follow Up:    Pt was returning a call, but had no idea who called her.

## 2018-08-30 NOTE — Telephone Encounter (Signed)
See below . Thanks

## 2018-08-30 NOTE — Telephone Encounter (Signed)
Patient returned call

## 2018-08-30 NOTE — Telephone Encounter (Signed)
Called patient, looked through chart did not see a reason for a call.  I advised patient I would send a message to nurse helping with Dr.Hilty, to see if she was attempting to get in touch with her- and if so she would contact her back.   Patient was fine with that, no questions.

## 2018-08-30 NOTE — Telephone Encounter (Signed)
Follow up  ° ° °Patient is returning call.  °

## 2018-08-31 ENCOUNTER — Encounter (HOSPITAL_COMMUNITY): Payer: Self-pay

## 2018-08-31 NOTE — Telephone Encounter (Signed)
Attempted to call pt back. If pt return phone call, please just schedule her for a virtual visit with Dr. [name] this week. Thanks

## 2018-09-01 ENCOUNTER — Encounter (HOSPITAL_COMMUNITY): Payer: Self-pay | Admitting: Student

## 2018-09-01 ENCOUNTER — Telehealth (HOSPITAL_COMMUNITY): Payer: Self-pay | Admitting: *Deleted

## 2018-09-01 ENCOUNTER — Other Ambulatory Visit: Payer: Self-pay

## 2018-09-01 ENCOUNTER — Observation Stay (HOSPITAL_COMMUNITY)
Admission: EM | Admit: 2018-09-01 | Discharge: 2018-09-02 | Disposition: A | Discharge status: Home / Self Care | Payer: Federal, State, Local not specified - PPO | Attending: Cardiology | Admitting: Cardiology

## 2018-09-01 ENCOUNTER — Emergency Department (HOSPITAL_COMMUNITY): Payer: Federal, State, Local not specified - PPO

## 2018-09-01 ENCOUNTER — Telehealth: Payer: Self-pay | Admitting: Internal Medicine

## 2018-09-01 DIAGNOSIS — E785 Hyperlipidemia, unspecified: Secondary | ICD-10-CM

## 2018-09-01 DIAGNOSIS — I700 Atherosclerosis of aorta: Secondary | ICD-10-CM | POA: Insufficient documentation

## 2018-09-01 DIAGNOSIS — E7800 Pure hypercholesterolemia, unspecified: Secondary | ICD-10-CM | POA: Insufficient documentation

## 2018-09-01 DIAGNOSIS — D649 Anemia, unspecified: Secondary | ICD-10-CM | POA: Diagnosis not present

## 2018-09-01 DIAGNOSIS — I255 Ischemic cardiomyopathy: Secondary | ICD-10-CM | POA: Diagnosis not present

## 2018-09-01 DIAGNOSIS — Z951 Presence of aortocoronary bypass graft: Secondary | ICD-10-CM | POA: Diagnosis not present

## 2018-09-01 DIAGNOSIS — I2510 Atherosclerotic heart disease of native coronary artery without angina pectoris: Secondary | ICD-10-CM | POA: Insufficient documentation

## 2018-09-01 DIAGNOSIS — F419 Anxiety disorder, unspecified: Secondary | ICD-10-CM | POA: Diagnosis not present

## 2018-09-01 DIAGNOSIS — I10 Essential (primary) hypertension: Secondary | ICD-10-CM | POA: Diagnosis present

## 2018-09-01 DIAGNOSIS — R918 Other nonspecific abnormal finding of lung field: Secondary | ICD-10-CM | POA: Diagnosis not present

## 2018-09-01 DIAGNOSIS — E7849 Other hyperlipidemia: Secondary | ICD-10-CM

## 2018-09-01 DIAGNOSIS — R079 Chest pain, unspecified: Secondary | ICD-10-CM | POA: Diagnosis not present

## 2018-09-01 DIAGNOSIS — Z7982 Long term (current) use of aspirin: Secondary | ICD-10-CM | POA: Insufficient documentation

## 2018-09-01 DIAGNOSIS — I252 Old myocardial infarction: Secondary | ICD-10-CM | POA: Insufficient documentation

## 2018-09-01 DIAGNOSIS — Z79899 Other long term (current) drug therapy: Secondary | ICD-10-CM | POA: Diagnosis not present

## 2018-09-01 DIAGNOSIS — K760 Fatty (change of) liver, not elsewhere classified: Secondary | ICD-10-CM | POA: Insufficient documentation

## 2018-09-01 LAB — CBC WITH DIFFERENTIAL/PLATELET
Abs Immature Granulocytes: 0.02 10*3/uL (ref 0.00–0.07)
Basophils Absolute: 0 10*3/uL (ref 0.0–0.1)
Basophils Relative: 0 %
Eosinophils Absolute: 0.1 10*3/uL (ref 0.0–0.5)
Eosinophils Relative: 2 %
HCT: 39.4 % (ref 36.0–46.0)
Hemoglobin: 13.1 g/dL (ref 12.0–15.0)
Immature Granulocytes: 0 %
Lymphocytes Relative: 35 %
Lymphs Abs: 2.1 10*3/uL (ref 0.7–4.0)
MCH: 29.6 pg (ref 26.0–34.0)
MCHC: 33.2 g/dL (ref 30.0–36.0)
MCV: 89.1 fL (ref 80.0–100.0)
Monocytes Absolute: 0.5 10*3/uL (ref 0.1–1.0)
Monocytes Relative: 8 %
Neutro Abs: 3.3 10*3/uL (ref 1.7–7.7)
Neutrophils Relative %: 55 %
Platelets: 195 10*3/uL (ref 150–400)
RBC: 4.42 MIL/uL (ref 3.87–5.11)
RDW: 12.1 % (ref 11.5–15.5)
WBC: 6 10*3/uL (ref 4.0–10.5)
nRBC: 0 % (ref 0.0–0.2)

## 2018-09-01 LAB — BASIC METABOLIC PANEL
Anion gap: 10 (ref 5–15)
Anion gap: 11 (ref 5–15)
BUN: 16 mg/dL (ref 6–20)
BUN: 15 mg/dL (ref 6–20)
CO2: 24 mmol/L (ref 22–32)
CO2: 25 mmol/L (ref 22–32)
Calcium: 10 mg/dL (ref 8.9–10.3)
Calcium: 9.9 mg/dL (ref 8.9–10.3)
Chloride: 103 mmol/L (ref 98–111)
Chloride: 104 mmol/L (ref 98–111)
Creatinine, Ser: 0.72 mg/dL (ref 0.44–1.00)
Creatinine, Ser: 0.76 mg/dL (ref 0.44–1.00)
GFR calc Af Amer: >60 mL/min (ref >60)
GFR calc Af Amer: >60 mL/min (ref >60)
GFR calc non Af Amer: >60 mL/min (ref >60)
GFR calc non Af Amer: >60 mL/min (ref >60)
Glucose, Bld: 152 mg/dL — ABNORMAL HIGH (ref 70–99)
Glucose, Bld: 124 mg/dL — ABNORMAL HIGH (ref 70–99)
Potassium: 5.2 mmol/L — ABNORMAL HIGH (ref 3.5–5.1)
Potassium: 4 mmol/L (ref 3.5–5.1)
Sodium: 137 mmol/L (ref 135–145)
Sodium: 140 mmol/L (ref 135–145)

## 2018-09-01 LAB — D-DIMER, QUANTITATIVE (NOT AT ARMC): D-Dimer, Quant: 0.74 ug/mL-FEU — ABNORMAL HIGH (ref 0.00–0.50)

## 2018-09-01 LAB — TROPONIN I
Troponin I: <0.03 ng/mL (ref <0.03)
Troponin I: <0.03 ng/mL (ref <0.03)

## 2018-09-01 MED ORDER — COQ10 200 MG PO CAPS: 200.0000 mg | ORAL_CAPSULE | Freq: Every day | ORAL | Status: DC

## 2018-09-01 MED ORDER — ASPIRIN 81 MG PO CHEW
324.0000 mg | CHEWABLE_TABLET | Freq: Once | ORAL | Status: AC
  Administered 2018-09-01: 324 mg via ORAL
  Filled 2018-09-01: qty 4

## 2018-09-01 MED ORDER — OMEGA-3 1000 MG PO CAPS: ORAL_CAPSULE | Freq: Every day | ORAL | Status: DC

## 2018-09-01 MED ORDER — ASPIRIN 81 MG PO CHEW: 324.0000 mg | CHEWABLE_TABLET | ORAL | Status: DC

## 2018-09-01 MED ORDER — ONDANSETRON HCL 4 MG/2ML IJ SOLN: 4.0000 mg | Freq: Four times a day (QID) | INTRAMUSCULAR | Status: DC | PRN

## 2018-09-01 MED ORDER — NITROGLYCERIN 0.4 MG SL SUBL: 0.4000 mg | SUBLINGUAL_TABLET | SUBLINGUAL | Status: DC | PRN

## 2018-09-01 MED ORDER — HEPARIN BOLUS VIA INFUSION
3500.0000 [IU] | Freq: Once | INTRAVENOUS | Status: AC
  Administered 2018-09-02: 3500 [IU] via INTRAVENOUS
  Filled 2018-09-01: qty 3500

## 2018-09-01 MED ORDER — IOHEXOL 350 MG/ML SOLN
100.0000 mL | Freq: Once | INTRAVENOUS | Status: AC | PRN
  Administered 2018-09-01: 19:00:00 61 mL via INTRAVENOUS

## 2018-09-01 MED ORDER — METOPROLOL SUCCINATE ER 25 MG PO TB24
12.5000 mg | ORAL_TABLET | Freq: Every day | ORAL | Status: DC
  Filled 2018-09-01: qty 1

## 2018-09-01 MED ORDER — SPIRONOLACTONE 25 MG PO TABS
25.0000 mg | ORAL_TABLET | Freq: Every day | ORAL | Status: DC
  Administered 2018-09-02: 25 mg via ORAL
  Filled 2018-09-01: qty 1

## 2018-09-01 MED ORDER — ASPIRIN EC 81 MG PO TBEC
81.0000 mg | DELAYED_RELEASE_TABLET | Freq: Every day | ORAL | Status: DC
  Administered 2018-09-02: 81 mg via ORAL
  Filled 2018-09-01: qty 1

## 2018-09-01 MED ORDER — LOMITAPIDE MESYLATE 5 MG PO CAPS
5.0000 mg | ORAL_CAPSULE | Freq: Every day | ORAL | Status: DC
  Administered 2018-09-02: 5 mg via ORAL
  Filled 2018-09-01 (×2): qty 1

## 2018-09-01 MED ORDER — OMEGA-3-ACID ETHYL ESTERS 1 G PO CAPS
1.0000 g | ORAL_CAPSULE | Freq: Every day | ORAL | Status: DC
  Administered 2018-09-02: 1 g via ORAL
  Filled 2018-09-01: qty 1

## 2018-09-01 MED ORDER — HEPARIN (PORCINE) 25000 UT/250ML-% IV SOLN
700.0000 [IU]/h | INTRAVENOUS | Status: DC
  Administered 2018-09-02: 700 [IU]/h via INTRAVENOUS
  Filled 2018-09-01: qty 250

## 2018-09-01 MED ORDER — ACETAMINOPHEN 325 MG PO TABS: 650.0000 mg | ORAL_TABLET | ORAL | Status: DC | PRN

## 2018-09-01 MED ORDER — LOSARTAN POTASSIUM 25 MG PO TABS
25.0000 mg | ORAL_TABLET | Freq: Every day | ORAL | Status: DC
  Administered 2018-09-01: 25 mg via ORAL
  Filled 2018-09-01: qty 1

## 2018-09-01 MED ORDER — ASPIRIN 300 MG RE SUPP: 300.0000 mg | RECTAL | Status: DC

## 2018-09-01 MED ORDER — EZETIMIBE 10 MG PO TABS
10.0000 mg | ORAL_TABLET | Freq: Every day | ORAL | Status: DC
  Administered 2018-09-01: 10 mg via ORAL
  Filled 2018-09-01: qty 1

## 2018-09-01 NOTE — Telephone Encounter (Signed)
Triage routed message to me.  I called and spoke with the patient. She had onset of left-sided chest pain last evening when watching TV. The chest pain felt like a pressure that radiated to her back and was rated as s 6/10. She also noted overnight and today that her heart rate is significantly lower than normal in the 40-50s range when she is generally in the 70-80s. She is compliant on her medications. She was mildly short of breath last night and had nausea. The chest pain continued with variable intensity throughout the night. The pain reminds her of the symptoms she had prior to her MI that resulted in CABG. The pain is less intense today but is continuing to wax and wane. She does not have nitrol  I advised her that given her history and symptoms, she should be evaluated in the ER. We discussed precautions for going to the ER. She expressed understanding.   I will also forward to her primary cardiologist.

## 2018-09-01 NOTE — Telephone Encounter (Signed)
Called patient, she states that last night she had pain in her chest on left side at rib cage, and into her back area. She attempted to do an EKG on her Apple watch- and the first time showed no abnormalities, and the second time told her it could not read due to her HR being so low. Her heart rate last night was around 45, but during the night she states she got an alert from her watch that her HR had dropped below 40. Patient states currently after being awake and moving around her HR is 52. She is still having current pains in her chest but they are not as intense as they were last night. She did mention having SOB with episode last night, none today- and denies any swelling in hands and feet. Patient mentions waking up having nausea early this morning, but that has gone away today so far.   Please advise of any recommendations

## 2018-09-01 NOTE — ED Provider Notes (Signed)
[facility] Provider Note   CSN: [id number] Arrival date & time: 09/01/18  1658   History   Chief Complaint Chief Complaint  Patient presents with  . Chest Pain    HPI [name] is a 48 y.o. female with a hx of CAD significant for emergent single-vessel coronary artery bypass grafting with a 99% ostial LAD stenosis , ischemic cardiomyopathy, hypercholesterolemia, and anemia who presents to the ED with complaints chest pain that began last night at 2100. Patient states she was sitting on her couch when she developed quick onset pain located to the L chest radiating to the back. Pain associated w/ shortness of breath and feeling as though she cannot get a good deep breath. Sxs eased off and have been waxing/waning but have not resolved since onset. Current pain is minimal at a 1/10 without alleviating/aggravating factors. No intervention PTA. Denies lightheadedness, dizziness, syncope, diaphoresis, leg pain/swelling, hemoptysis, recent surgery/trauma, recent long travel, hormone use, personal hx of cancer, or hx of DVT/PE. Patient states that sxs feel similar to her prior issues with CAD which required CABG.   Primary cardiology team includes: Dr. [name] & Dr. [name]       HPI  Past Medical History:  Diagnosis Date  . Anemia    due to heavy periods  . Anxiety   . Coronary artery disease   . Difficult intubation 01/22/2016  . Headache   . Heart murmur    aschild antibiotics if having Dental work  . Hypocholesteremia    medical treatment  . Ischemic cardiomyopathy   . Menorrhagia   . NSTEMI (non-ST elevated myocardial infarction) ([place])   . Pelvic pain     Patient Active Problem List   Diagnosis Date Noted  . Elevated liver enzymes 04/01/2018  . CAD in native artery 04/24/2017  . Fatigue due to treatment 04/03/2017  . Ischemic cardiomyopathy 03/17/2017  . Hyperlipidemia LDL goal <70 12/26/2016  . S/P CABG x 1 07/12/2016  . NSTEMI  (non-ST elevated myocardial infarction) ([place]) 07/11/2016    Past Surgical History:  Procedure Laterality Date  . ABDOMINAL HYSTERECTOMY    . CORONARY ARTERY BYPASS GRAFT N/A 07/12/2016   Procedure: CORONARY ARTERY BYPASS GRAFTING (CABG), ON PUMP, TIMES ONE, USING LEFT INTERNAL MAMMARY ARTERY WITH TEE;  Surgeon: Gaye Pollack, MD;  Location: [facility];  Service: Open Heart Surgery;  Laterality: N/A;  LIMA to LAD  . goiter     left side goiter removed  . INGUINAL HERNIA REPAIR Bilateral 01/22/2016   Procedure: LAPAROSCOPIC BILATERAL FEMORAL AND RIGHT INGUINAL HERNIA REPAIRWITH INSERTION OF MESH;  Surgeon: Michael Boston, MD;  Location: [facility];  Service: General;  Laterality: Bilateral;  . LEFT HEART CATH AND CORONARY ANGIOGRAPHY N/A 07/12/2016   Procedure: Left Heart Cath and Coronary Angiography;  Surgeon: Lorretta Harp, MD;  Location: [facility];  Service: Cardiovascular;  Laterality: N/A;  . SVD     x 1  . WISDOM TOOTH EXTRACTION       OB History   No obstetric history on file.      Home Medications    Prior to Admission medications   Medication Sig Start Date End Date Taking? Authorizing Provider  aspirin 81 MG tablet Take 1 tablet (81 mg total) by mouth daily. 12/30/17   Bensimhon, Shaune Pascal, MD  Coenzyme Q10 (COQ10) 200 MG CAPS Take 200 mg by mouth daily.     [provider]  ezetimibe (ZETIA) 10 MG tablet  Take 1 tablet (10 mg total) by mouth daily. 04/15/18   Hilty, Nadean Corwin, MD  Lomitapide Mesylate 5 MG CAPS Take 1 capsule by mouth daily.    [provider]  losartan (COZAAR) 25 MG tablet Take 1 tablet (25 mg total) by mouth daily. 04/15/18 07/14/18  Pixie Casino, MD  metoprolol succinate (TOPROL-XL) 25 MG 24 hr tablet Take 0.5 tablets (12.5 mg total) by mouth daily. 04/15/18   Hilty, Nadean Corwin, MD  spironolactone (ALDACTONE) 25 MG tablet Take 1 tablet (25 mg total) by mouth daily. 04/15/18   Hilty, Nadean Corwin, MD    Family History Family History   Adopted: Yes    Social History Social History   Tobacco Use  . Smoking status: Never Smoker  . Smokeless tobacco: Never Used  Substance Use Topics  . Alcohol use: No  . Drug use: No     Allergies   Statins and Bactrim   Review of Systems Review of Systems  Constitutional: Negative for chills and fever.  Respiratory: Positive for shortness of breath. Negative for cough and wheezing.   Cardiovascular: Positive for chest pain. Negative for palpitations and leg swelling.  Gastrointestinal: Negative for abdominal pain, nausea and vomiting.  Musculoskeletal: Negative for myalgias.  Neurological: Negative for dizziness, syncope, weakness and numbness.  All other systems reviewed and are negative.  Physical Exam Updated Vital Signs BP (!) 144/87   Pulse 68   Temp 98.6 F (37 C) (Oral)   Resp 18   Ht 5' (1.524 m)   Wt 55.3 kg   LMP 08/10/2011   SpO2 98%   BMI 23.83 kg/m   Physical Exam Vitals signs and nursing note reviewed.  Constitutional:      General: She is not in acute distress.    Appearance: She is well-developed. She is not toxic-appearing.  HENT:     Head: Normocephalic and atraumatic.  Eyes:     General:        Right eye: No discharge.        Left eye: No discharge.     Conjunctiva/sclera: Conjunctivae normal.  Neck:     Musculoskeletal: Neck supple.  Cardiovascular:     Rate and Rhythm: Normal rate and regular rhythm.     Pulses:          Radial pulses are 2+ on the right side and 2+ on the left side.       Dorsalis pedis pulses are 2+ on the right side and 2+ on the left side.  Pulmonary:     Effort: Pulmonary effort is normal. No respiratory distress.     Breath sounds: Normal breath sounds. No wheezing, rhonchi or rales.  Chest:     Chest wall: No tenderness.  Abdominal:     General: There is no distension.     Palpations: Abdomen is soft.     Tenderness: There is no abdominal tenderness.  Musculoskeletal:     Right lower leg: She  exhibits no tenderness. No edema.     Left lower leg: She exhibits no tenderness. No edema.  Skin:    General: Skin is warm and dry.     Findings: No rash.  Neurological:     Mental Status: She is alert.     Comments: Clear speech.   Psychiatric:        Behavior: Behavior normal.    ED Treatments / Results  Labs (all labs ordered are listed, but only abnormal results are displayed) Labs  Reviewed  BASIC METABOLIC PANEL - Abnormal; Notable for the following components:      Result Value   Potassium 5.2 (*)    Glucose, Bld 152 (*)    All other components within normal limits  D-DIMER, QUANTITATIVE (NOT AT [facility]) - Abnormal; Notable for the following components:   D-Dimer, Quant 0.74 (*)    All other components within normal limits  BASIC METABOLIC PANEL - Abnormal; Notable for the following components:   Glucose, Bld 124 (*)    All other components within normal limits  TROPONIN I  CBC WITH DIFFERENTIAL/PLATELET  CBC WITH DIFFERENTIAL/PLATELET    EKG EKG Interpretation  Date/Time:  Wednesday September 01 2018 17:07:45 EDT Ventricular Rate:  56 PR Interval:    QRS Duration: 83 QT Interval:  460 QTC Calculation: 444 R Axis:   42 Text Interpretation:  Sinus rhythm Anterior infarct, old Confirmed by Lennice Sites [phone]) on 09/01/2018 5:11:51 PM   Radiology Dg Chest 2 View  Result Date: 09/01/2018 CLINICAL DATA:  Chest pain EXAM: CHEST - 2 VIEW COMPARISON:  08/30/2016 FINDINGS: Postop CABG. Heart size normal. Negative for heart failure. Lungs are clear without infiltrate or effusion. No change from the prior. IMPRESSION: No active cardiopulmonary disease. Electronically Signed   By: Franchot Gallo M.D.   On: 09/01/2018 18:22   Ct Angio Chest Pe W/cm &/or Wo Cm  Result Date: 09/01/2018 CLINICAL DATA:  Chest pain EXAM: CT ANGIOGRAPHY CHEST WITH CONTRAST TECHNIQUE: Multidetector CT imaging of the chest was performed using the standard protocol during bolus administration of  intravenous contrast. Multiplanar CT image reconstructions and MIPs were obtained to evaluate the vascular anatomy. CONTRAST:  45mL OMNIPAQUE IOHEXOL 350 MG/ML SOLN COMPARISON:  CT angiogram chest August 30, 2016 and chest radiograph September 01, 2018 FINDINGS: Cardiovascular: There is no demonstrable pulmonary embolus. There is no thoracic aortic aneurysm. No dissection is seen. The contrast bolus in the aorta is less than for dissection assessment. The visualized great vessels appear unremarkable. There are scattered foci of aortic atherosclerosis. There is no pericardial effusion or pericardial thickening evident. The main pulmonary outflow tract is prominent, measuring 3.1 cm. Patient is status post coronary artery bypass grafting. Mediastinum/Nodes: Patient has had removal of the right lobe of the thyroid. There is a nodular opacity in the left lobe measuring just over 1 cm. There is no appreciable thoracic adenopathy. No esophageal lesions are evident. Lungs/Pleura: There is mild bibasilar atelectasis. There is no appreciable edema or consolidation. No pleural effusion or pleural thickening evident. Upper Abdomen: There is diffuse hepatic steatosis. Visualized upper abdominal structures otherwise appear unremarkable. Musculoskeletal: Patient is status post median sternotomy. No blastic or lytic bone lesions are evident. There are no appreciable chest wall lesions. Review of the MIP images confirms the above findings. IMPRESSION: 1. No demonstrable pulmonary embolus. No thoracic aortic aneurysm. No dissection evident; the contrast bolus in the aorta is less than optimal for dissection assessment. There is aortic atherosclerosis. Patient is status post coronary artery bypass grafting. 2. Prominence the main pulmonary outflow tract is felt to be indicative of a degree of pulmonary arterial hypertension. 3.  Bibasilar atelectasis.  No edema or consolidation. 4.  No adenopathy evident. 5. Status post removal of the right  lobe of the thyroid. Mass in the left lobe of the thyroid is noted. Per consensus guidelines, a mass of this size does not warrant additional imaging surveillance. 6.  Diffuse hepatic steatosis. Aortic Atherosclerosis (ICD10-I70.0). Electronically Signed   By: Gwyndolyn Saxon  Jasmine December III M.D.   On: 09/01/2018 19:55    Procedures Procedures (including critical care time)  Medications Ordered in ED Medications - No data to display   Initial Impression / Assessment and Plan / ED Course  I have reviewed the triage vital signs and the nursing notes.  Pertinent labs & imaging results that were available during my care of the patient were reviewed by me and considered in my medical decision making (see chart for details).    Patient presents to the emergency department with chest pain. Patient nontoxic appearing, in no apparent distress, vitals without significant abnormality, mild tachypnea resolved on my assessment, BP mildly elevated. Fairly benign physical exam. No reproducibility w/ palpation. DDX: ACS, pulmonary embolism, dissection, pneumothorax, effusion, infiltrate, arrhythmia, anemia, electrolyte derangement, MSK, GERD, anxiety. Evaluation initiated with labs, EKG, and CXR. Patient on cardiac monitor.   Initial CBC clotted, BMP w/ hyperkalemia hemolyzed- repeat basic labs.   Work-up in the ER reviewed:  CBC: No anemia. No leukocytosis.  BMP: Mild hyperglycemia @ 124. Otherwise unremarkable. No electrolyte disturbance.  Troponin: Negative.  EKG: No STEMI.  CXR:  Negative, without infiltrate, effusion, pneumothorax, or fracture/dislocation.   Low risk [place], she is on beta blocker so may be masking elevated HR, D-dimer obtained +, CTA negative for PE, no notable thoracic aortic aneurysm or evident dissection. There is aortic atherosclerosis. Patient is status post coronary artery bypass grafting. Prominence the main pulmonary outflow tract is felt to be indicative of a degree of pulmonary  arterial hypertension. Bibasilar atelectasis.  No edema or consolidation. 4.  No adenopathy evident. Status post removal of the right lobe of the thyroid. Mass in the left lobe of the thyroid is noted. Per consensus guidelines, a mass of this size does not warrant additional imaging surveillance. Diffuse hepatic steatosis  Patient remains well appearing in the ER.  Work-up thus far re-assuring; however, given hx of CABG, this pain feels similar, will plan for admission for chest pain obs.   21:15: CONSULT: Discussed case with Dr. Hassell Done, cardiology, accepts admission.    Findings and plan of care discussed with supervising physician Dr. Ronnald Nian who is in agreement.   Final Clinical Impressions(s) / ED Diagnoses   Final diagnoses:  Chest pain, unspecified type    ED Discharge Orders    None       Amaryllis Dyke, PA-C 09/01/18 [id number]    Lennice Sites, DO 09/01/18 2326

## 2018-09-01 NOTE — Telephone Encounter (Signed)
Called to notify patient that the cardiac and pulmonary rehabilitation department will be closed temporarily due to COVID-19 restrictions. Pt verbalized understanding.   Sol Passer, MS, ACSM CEP 09/01/2018 [phone]

## 2018-09-01 NOTE — ED Notes (Signed)
ED TO INPATIENT HANDOFF REPORT  ED Nurse Name and Phone #: Suezanne Jacquet [phone]  S Name/Age/Gender [name] 48 y.o. female Room/Bed: 022C/022C  Code Status   Code Status: Full Code  Home/SNF/Other Home Patient oriented to: self, place, time and situation Is this baseline? Yes   Triage Complete: Triage complete  Chief Complaint Chest Pain; SOB  Triage Note Pt stated she has been having chest pain that started last night at approx 2100 that is mainly around the area of her Lt breast a some in her left back. She states this pain makes it hard to take a deep breath & will hurt while still or exertion, pain rated 5/10. Also states she has been bradycardic, down in the 50's.   Allergies Allergies  Allergen Reactions  . Statins Other (See Comments)    Muscle aches with higher doses of: Crestor & Lipitor  . Bactrim Itching and Rash         Level of Care/Admitting Diagnosis ED Disposition    ED Disposition Condition [facility] Area: [place] [100100]  Level of Care: Telemetry Cardiac [103]  Diagnosis: Chest pain [073710]  Admitting Physician: Rudean Curt [[id number]]  Attending Physician: Buford Dresser [[id number]]  PT Class (Do Not Modify): Observation [104]  PT Acc Code (Do Not Modify): Observation [10022]       B Medical/Surgery History Past Medical History:  Diagnosis Date  . Anemia    due to heavy periods  . Anxiety   . Coronary artery disease   . Difficult intubation 01/22/2016  . Headache   . Heart murmur    aschild antibiotics if having Dental work  . Hypocholesteremia    medical treatment  . Ischemic cardiomyopathy   . Menorrhagia   . NSTEMI (non-ST elevated myocardial infarction) ([place])   . Pelvic pain    Past Surgical History:  Procedure Laterality Date  . ABDOMINAL HYSTERECTOMY    . CORONARY ARTERY BYPASS GRAFT N/A 07/12/2016   Procedure: CORONARY ARTERY BYPASS GRAFTING (CABG), ON PUMP, TIMES ONE,  USING LEFT INTERNAL MAMMARY ARTERY WITH TEE;  Surgeon: Gaye Pollack, MD;  Location: [facility];  Service: Open Heart Surgery;  Laterality: N/A;  LIMA to LAD  . goiter     left side goiter removed  . INGUINAL HERNIA REPAIR Bilateral 01/22/2016   Procedure: LAPAROSCOPIC BILATERAL FEMORAL AND RIGHT INGUINAL HERNIA REPAIRWITH INSERTION OF MESH;  Surgeon: Michael Boston, MD;  Location: [facility];  Service: General;  Laterality: Bilateral;  . LEFT HEART CATH AND CORONARY ANGIOGRAPHY N/A 07/12/2016   Procedure: Left Heart Cath and Coronary Angiography;  Surgeon: Lorretta Harp, MD;  Location: [facility];  Service: Cardiovascular;  Laterality: N/A;  . SVD     x 1  . WISDOM TOOTH EXTRACTION       A IV Location/Drains/Wounds Patient Lines/Drains/Airways Status   Active Line/Drains/Airways    Name:   Placement date:   Placement time:   Site:   Days:   Peripheral IV 09/01/18 Left Antecubital   09/01/18    1915    Antecubital   less than 1   Peripheral IV 09/01/18 Left Antecubital   09/01/18    1917    Antecubital   less than 1   Incision - 3 Ports Abdomen Right;Medial Umbilicus Left;Medial   00/93/81    0809     953          Intake/Output Last 24 hours No intake or  output data in the 24 hours ending 09/01/18 2221  Labs/Imaging Results for orders placed or performed during the hospital encounter of 09/01/18 (from the past 48 hour(s))  D-dimer, quantitative (not at [facility])     Status: Abnormal   Collection Time: 09/01/18  5:36 PM  Result Value Ref Range   D-Dimer, Quant 0.74 (H) 0.00 - 0.50 ug/mL-FEU    Comment: (NOTE) At the manufacturer cut-off of 0.50 ug/mL FEU, this assay has been documented to exclude PE with a sensitivity and negative predictive value of 97 to 99%.  At this time, this assay has not been approved by the FDA to exclude DVT/VTE. Results should be correlated with clinical presentation. Performed at [facility], [place] [address], [place], [place] [address]   Basic  metabolic panel     Status: Abnormal   Collection Time: 09/01/18  5:37 PM  Result Value Ref Range   Sodium 137 135 - 145 mmol/L   Potassium 5.2 (H) 3.5 - 5.1 mmol/L   Chloride 103 98 - 111 mmol/L   CO2 24 22 - 32 mmol/L   Glucose, Bld 152 (H) 70 - 99 mg/dL   BUN 16 6 - 20 mg/dL   Creatinine, Ser 0.72 0.44 - 1.00 mg/dL   Calcium 10.0 8.9 - 10.3 mg/dL   GFR calc non Af Amer >60 >60 mL/min   GFR calc Af Amer >60 >60 mL/min   Anion gap 10 5 - 15    Comment: Performed at [facility], [place] [address], [place], [place] [address]  Troponin I - ONCE - STAT     Status: None   Collection Time: 09/01/18  5:37 PM  Result Value Ref Range   Troponin I <0.03 <0.03 ng/mL    Comment: Performed at [facility], [name] [address], [place], [place] [address]  CBC with Differential     Status: None   Collection Time: 09/01/18  7:17 PM  Result Value Ref Range   WBC 6.0 4.0 - 10.5 K/uL   RBC 4.42 3.87 - 5.11 MIL/uL   Hemoglobin 13.1 12.0 - 15.0 g/dL   HCT 39.4 36.0 - 46.0 %   MCV 89.1 80.0 - 100.0 fL   MCH 29.6 26.0 - 34.0 pg   MCHC 33.2 30.0 - 36.0 g/dL   RDW 12.1 11.5 - 15.5 %   Platelets 195 150 - 400 K/uL   nRBC 0.0 0.0 - 0.2 %   Neutrophils Relative % 55 %   Neutro Abs 3.3 1.7 - 7.7 K/uL   Lymphocytes Relative 35 %   Lymphs Abs 2.1 0.7 - 4.0 K/uL   Monocytes Relative 8 %   Monocytes Absolute 0.5 0.1 - 1.0 K/uL   Eosinophils Relative 2 %   Eosinophils Absolute 0.1 0.0 - 0.5 K/uL   Basophils Relative 0 %   Basophils Absolute 0.0 0.0 - 0.1 K/uL   Immature Granulocytes 0 %   Abs Immature Granulocytes 0.02 0.00 - 0.07 K/uL    Comment: Performed at [facility], [address], [place], [place] [address]  Basic metabolic panel     Status: Abnormal   Collection Time: 09/01/18  7:17 PM  Result Value Ref Range   Sodium 140 135 - 145 mmol/L   Potassium 4.0 3.5 - 5.1 mmol/L   Chloride 104 98 - 111 mmol/L   CO2 25 22 - 32 mmol/L   Glucose, Bld 124 (H) 70 - 99 mg/dL   BUN 15 6 -  20 mg/dL   Creatinine, Ser 0.76 0.44 - 1.00 mg/dL   Calcium 9.9 8.9 - 10.3 mg/dL   GFR calc non Af Amer >60 >60 mL/min   GFR calc Af Amer >60 >60 mL/min   Anion gap 11 5 - 15    Comment: Performed at [facility] [address] [place], [place] [address]   Dg Chest 2 View  Result Date: 09/01/2018 CLINICAL DATA:  Chest pain EXAM: CHEST - 2 VIEW COMPARISON:  08/30/2016 FINDINGS: Postop CABG. Heart size normal. Negative for heart failure. Lungs are clear without infiltrate or effusion. No change from the prior. IMPRESSION: No active cardiopulmonary disease. Electronically Signed   By: Franchot Gallo M.D.   On: 09/01/2018 18:22   Ct Angio Chest Pe W/cm &/or Wo Cm  Result Date: 09/01/2018 CLINICAL DATA:  Chest pain EXAM: CT ANGIOGRAPHY CHEST WITH CONTRAST TECHNIQUE: Multidetector CT imaging of the chest was performed using the standard protocol during bolus administration of intravenous contrast. Multiplanar CT image reconstructions and MIPs were obtained to evaluate the vascular anatomy. CONTRAST:  38mL OMNIPAQUE IOHEXOL 350 MG/ML SOLN COMPARISON:  CT angiogram chest August 30, 2016 and chest radiograph September 01, 2018 FINDINGS: Cardiovascular: There is no demonstrable pulmonary embolus. There is no thoracic aortic aneurysm. No dissection is seen. The contrast bolus in the aorta is less than for dissection assessment. The visualized great vessels appear unremarkable. There are scattered foci of aortic atherosclerosis. There is no pericardial effusion or pericardial thickening evident. The main pulmonary outflow tract is prominent, measuring 3.1 cm. Patient is status post coronary artery bypass grafting. Mediastinum/Nodes: Patient has had removal of the right lobe of the thyroid. There is a nodular opacity in the left lobe measuring just over 1 cm. There is no appreciable thoracic adenopathy. No esophageal lesions are evident. Lungs/Pleura: There is mild bibasilar atelectasis. There is no appreciable  edema or consolidation. No pleural effusion or pleural thickening evident. Upper Abdomen: There is diffuse hepatic steatosis. Visualized upper abdominal structures otherwise appear unremarkable. Musculoskeletal: Patient is status post median sternotomy. No blastic or lytic bone lesions are evident. There are no appreciable chest wall lesions. Review of the MIP images confirms the above findings. IMPRESSION: 1. No demonstrable pulmonary embolus. No thoracic aortic aneurysm. No dissection evident; the contrast bolus in the aorta is less than optimal for dissection assessment. There is aortic atherosclerosis. Patient is status post coronary artery bypass grafting. 2. Prominence the main pulmonary outflow tract is felt to be indicative of a degree of pulmonary arterial hypertension. 3.  Bibasilar atelectasis.  No edema or consolidation. 4.  No adenopathy evident. 5. Status post removal of the right lobe of the thyroid. Mass in the left lobe of the thyroid is noted. Per consensus guidelines, a mass of this size does not warrant additional imaging surveillance. 6.  Diffuse hepatic steatosis. Aortic Atherosclerosis (ICD10-I70.0). Electronically Signed   By: Lowella Grip III M.D.   On: 09/01/2018 19:55    Pending Labs Unresulted Labs (From admission, onward)    Start     Ordered   09/03/18 0500  Heparin level (unfractionated)  Daily,   R     09/01/18 2220   09/02/18 8916  Basic metabolic panel  Tomorrow morning,   R     09/01/18 2214   09/02/18 0500  Lipid panel  Tomorrow morning,   R     09/01/18 2214   09/02/18 0500  CBC  Tomorrow morning,   R  09/01/18 2214   09/02/18 0500  Protime-INR  Tomorrow morning,   R     09/01/18 2214   09/02/18 0500  Heparin level (unfractionated)  Once-Timed,   R     09/01/18 2220   09/02/18 0500  CBC  Daily,   R     09/01/18 2220   09/01/18 2214  Troponin I - Now Then Q6H  Now then every 6 hours,   STAT     09/01/18 2214   09/01/18 2213  HIV antibody (Routine  Testing)  Once,   R     09/01/18 2214   09/01/18 1721  CBC with Differential  ONCE - STAT,   STAT     09/01/18 1721          Vitals/Pain Today's Vitals   09/01/18 1715 09/01/18 1716 09/01/18 1717 09/01/18 2208  BP: (!) 144/87   122/79  Pulse: 68   (!) 54  Resp: 18   18  Temp:      TempSrc:      SpO2: 98%   100%  Weight:  55.3 kg    Height:  5' (1.524 m)    PainSc:   1      Isolation Precautions No active isolations  Medications Medications  aspirin EC tablet 81 mg (has no administration in time range)  nitroGLYCERIN (NITROSTAT) SL tablet 0.4 mg (has no administration in time range)  acetaminophen (TYLENOL) tablet 650 mg (has no administration in time range)  ondansetron (ZOFRAN) injection 4 mg (has no administration in time range)  ezetimibe (ZETIA) tablet 10 mg (has no administration in time range)  Lomitapide Mesylate CAPS 5 mg (has no administration in time range)  losartan (COZAAR) tablet 25 mg (has no administration in time range)  metoprolol succinate (TOPROL-XL) 24 hr tablet 12.5 mg (has no administration in time range)  spironolactone (ALDACTONE) tablet 25 mg (has no administration in time range)  omega-3 acid ethyl esters (LOVAZA) capsule 1 g (has no administration in time range)  heparin bolus via infusion 3,500 Units (has no administration in time range)  heparin ADULT infusion 100 units/mL (25000 units/231mL sodium chloride 0.45%) (has no administration in time range)  aspirin chewable tablet 324 mg (324 mg Oral Given 09/01/18 1832)  iohexol (OMNIPAQUE) 350 MG/ML injection 100 mL (61 mLs Intravenous Contrast Given 09/01/18 1928)    Mobility walks Low fall risk   Focused Assessments Cardiac Assessment Handoff:  Cardiac Rhythm: Sinus bradycardia, Normal sinus rhythm Lab Results  Component Value Date   CKTOTAL 362 (H) 04/05/2008   CKMB 3.6 04/05/2008   TROPONINI <0.03 09/01/2018   Lab Results  Component Value Date   DDIMER 0.74 (H) 09/01/2018   Does  the Patient currently have chest pain? Yes     R Recommendations: See Admitting Provider Note  Report given to:   Additional Notes: N/A

## 2018-09-01 NOTE — ED Triage Notes (Addendum)
Pt stated she has been having chest pain that started last night at approx 2100 that is mainly around the area of her Lt breast a some in her left back. She states this pain makes it hard to take a deep breath & will hurt while still or exertion, pain rated 5/10. Also states she has been bradycardic, down in the 50's.

## 2018-09-01 NOTE — ED Notes (Signed)
Patient transported to CT 

## 2018-09-01 NOTE — Telephone Encounter (Signed)
I contacted patient to notify of cardiac rehab temporary closure due to COVID-19 restrictions. Patient stated that she had experience some left sided chest pain recently and also noted that her heart rate monitor indicated her heart rate had dropped below 50 overnight. I advised patient to contact Dr. Kennon Holter office for further consultation, and patient will call the office now.  Sol Passer, MS, ACSM CEP 09/01/2018 1515

## 2018-09-01 NOTE — Telephone Encounter (Signed)
Pt c/o of Chest Pain: STAT if CP now or developed within 24 hours  1. Are you having CP right now? Just went away    2. Are you experiencing any other symptoms (ex. SOB, nausea, vomiting, sweating)? Last night nausea  3. How long have you been experiencing CP? Started last night L SIDE by her rib cage  5 pain sclae  4. Is your CP continuous or coming and going? Was constant lowe heart rate 60 last night 40 hr  5. Have you taken Nitroglycerin? NO ?

## 2018-09-01 NOTE — ED Notes (Deleted)
Critical K (2.6) EDP aware.

## 2018-09-01 NOTE — Progress Notes (Signed)
Pt's husband to bring pt's medicine, Lomitapide Mesylate, from home to the entrance of hospital for pickup by RN.

## 2018-09-01 NOTE — ED Notes (Signed)
Patient transported to X-ray 

## 2018-09-01 NOTE — H&P (Signed)
History and Physical   Patient ID: [name], MRN: [id number], DOB: 1970-12-02   Date of Encounter: 09/01/2018, 9:01 PM  Primary Care Provider: Lawerance Cruel, MD Cardiologist: [facility] Electrophysiologist:  NA  Chief Complaint:  CP  History of Present Illness: [name] is a 48 y.o. female w/ h/o CAD, s/p one-vessel CABG w/ LIMA-LAD in 2018 due to dLM/pLAD lesion presents to ED tonight c/o CP. Her pain started yesterday, last night, with random onset at rest. She describes a sharp, L-sided CP that radiated into her back, similar to her prior angina. There was associated SOB and diaphoresis as well as nausea. The sx lasted a few minutes w/ intense severity, then leveled off but has come and go intermittently through the day today. She did not try NTG for relief (she says she did not have any to take). She has not had any exertional sx of chest discomfort or SOB/DOE recently prior to this incident. Trop so far is negative; EKG shows no acute ST ischemic ST changes.  Past Medical History:  Diagnosis Date   Anemia    due to heavy periods   Anxiety    Coronary artery disease    Difficult intubation 01/22/2016   Headache    Heart murmur    aschild antibiotics if having Dental work   Hypocholesteremia    medical treatment   Ischemic cardiomyopathy    Menorrhagia    NSTEMI (non-ST elevated myocardial infarction) ([name])    Pelvic pain     Past Surgical History:  Procedure Laterality Date   ABDOMINAL HYSTERECTOMY     CORONARY ARTERY BYPASS GRAFT N/A 07/12/2016   Procedure: CORONARY ARTERY BYPASS GRAFTING (CABG), ON PUMP, TIMES ONE, USING LEFT INTERNAL MAMMARY ARTERY WITH TEE;  Surgeon: Gaye Pollack, MD;  Location: [facility];  Service: Open Heart Surgery;  Laterality: N/A;  LIMA to LAD   goiter     left side goiter removed   INGUINAL HERNIA REPAIR Bilateral 01/22/2016   Procedure: LAPAROSCOPIC BILATERAL FEMORAL AND RIGHT INGUINAL HERNIA REPAIRWITH INSERTION OF MESH;   Surgeon: Michael Boston, MD;  Location[facility];  Service: General;  Laterality: Bilateral;   LEFT HEART CATH AND CORONARY ANGIOGRAPHY N/A 07/12/2016   Procedure: Left Heart Cath and Coronary Angiography;  Surgeon: Lorretta Harp, MD;  Location: [facility];  Service: Cardiovascular;  Laterality: N/A;   SVD     x 1   WISDOM TOOTH EXTRACTION       Prior to Admission medications   Medication Sig Start Date End Date Taking? Authorizing Provider  aspirin 81 MG tablet Take 1 tablet (81 mg total) by mouth daily. 12/30/17  Yes Bensimhon, Shaune Pascal, MD  Coenzyme Q10 (COQ10) 200 MG CAPS Take 200 mg by mouth at bedtime.    Yes [provider]  ezetimibe (ZETIA) 10 MG tablet Take 1 tablet (10 mg total) by mouth daily. Patient taking differently: Take 10 mg by mouth at bedtime.  04/15/18  Yes Hilty, Nadean Corwin, MD  ibuprofen (ADVIL,MOTRIN) 200 MG tablet Take 200 mg by mouth every 6 (six) hours as needed (for headaches or pain).   Yes [provider]  Lomitapide Mesylate (JUXTAPID) 5 MG CAPS Take 5 mg by mouth at bedtime.   Yes [provider]  losartan (COZAAR) 25 MG tablet Take 1 tablet (25 mg total) by mouth daily. Patient taking differently: Take 25 mg by mouth at bedtime.  04/15/18 09/01/18 Yes Hilty, Nadean Corwin, MD  metoprolol succinate (TOPROL-XL)  25 MG 24 hr tablet Take 0.5 tablets (12.5 mg total) by mouth daily. Patient taking differently: Take 12.5 mg by mouth at bedtime.  04/15/18  Yes Hilty, Nadean Corwin, MD  Omega-3 Fatty Acids (OMEGA-3 PO) Take 1 capsule by mouth daily with breakfast.   Yes [provider]  spironolactone (ALDACTONE) 25 MG tablet Take 1 tablet (25 mg total) by mouth daily. 04/15/18  Yes Hilty, Nadean Corwin, MD  VITAMIN D PO Take 1 tablet by mouth daily with breakfast.   Yes [provider]     Allergies: Allergies  Allergen Reactions   Statins Other (See Comments)    Muscle aches with higher doses of: Crestor & Lipitor    Bactrim Itching and Rash         Social History:  The patient  reports that she has never smoked. She has never used smokeless tobacco. She reports that she does not drink alcohol or use drugs.   Family History:  The patient's family history is not on file. She was adopted.   ROS:  Please see the history of present illness.     All other systems reviewed and negative.   Vital Signs: Blood pressure (!) 144/87, pulse 68, temperature 98.6 F (37 C), temperature source Oral, resp. rate 18, height 5' (1.524 m), weight 55.3 kg, last menstrual period 08/10/2011, SpO2 98 %.  PHYSICAL EXAM: General:  Well nourished, well developed, in no acute distress HEENT: normal Lymph: no adenopathy Neck: no JVD Endocrine:  No thryomegaly Vascular: No carotid bruits; DP pulses 2+ bilaterally  Cardiac:  normal S1, S2; RRR; no murmur  Lungs:  clear to auscultation bilaterally, no wheezing, rhonchi or rales  Abd: soft, nontender, no hepatomegaly  Ext: no edema Musculoskeletal:  No deformities, BUE and BLE strength normal and equal Skin: warm and dry  Neuro:  CNs 2-12 intact, no focal abnormalities noted Psych:  Normal affect   EKG:  NSR with anterior infarct (no change from prior)  Labs:   Lab Results  Component Value Date   WBC 6.0 09/01/2018   HGB 13.1 09/01/2018   HCT 39.4 09/01/2018   MCV 89.1 09/01/2018   PLT 195 09/01/2018   Recent Labs  Lab 09/01/18 1917  NA 140  K 4.0  CL 104  CO2 25  BUN 15  CREATININE 0.76  CALCIUM 9.9  GLUCOSE 124*   Recent Labs    09/01/18 1737  TROPONINI <0.03   Troponin (Point of Care Test) No results for input(s): TROPIPOC in the last 72 hours.  Lab Results  Component Value Date   CHOL 274 (H) 03/30/2018   HDL 30 (L) 03/30/2018   LDLCALC 224 (H) 03/30/2018   TRIG 100 03/30/2018   Lab Results  Component Value Date   DDIMER 0.74 (H) 09/01/2018    Radiology/Studies:  Dg Chest 2 View  Result Date: 09/01/2018 CLINICAL DATA:  Chest pain  EXAM: CHEST - 2 VIEW COMPARISON:  08/30/2016 FINDINGS: Postop CABG. Heart size normal. Negative for heart failure. Lungs are clear without infiltrate or effusion. No change from the prior. IMPRESSION: No active cardiopulmonary disease. Electronically Signed   By: Franchot Gallo M.D.   On: 09/01/2018 18:22   Ct Angio Chest Pe W/cm &/or Wo Cm  Result Date: 09/01/2018 CLINICAL DATA:  Chest pain EXAM: CT ANGIOGRAPHY CHEST WITH CONTRAST TECHNIQUE: Multidetector CT imaging of the chest was performed using the standard protocol during bolus administration of intravenous contrast. Multiplanar CT image reconstructions and MIPs were obtained  to evaluate the vascular anatomy. CONTRAST:  30mL OMNIPAQUE IOHEXOL 350 MG/ML SOLN COMPARISON:  CT angiogram chest August 30, 2016 and chest radiograph September 01, 2018 FINDINGS: Cardiovascular: There is no demonstrable pulmonary embolus. There is no thoracic aortic aneurysm. No dissection is seen. The contrast bolus in the aorta is less than for dissection assessment. The visualized great vessels appear unremarkable. There are scattered foci of aortic atherosclerosis. There is no pericardial effusion or pericardial thickening evident. The main pulmonary outflow tract is prominent, measuring 3.1 cm. Patient is status post coronary artery bypass grafting. Mediastinum/Nodes: Patient has had removal of the right lobe of the thyroid. There is a nodular opacity in the left lobe measuring just over 1 cm. There is no appreciable thoracic adenopathy. No esophageal lesions are evident. Lungs/Pleura: There is mild bibasilar atelectasis. There is no appreciable edema or consolidation. No pleural effusion or pleural thickening evident. Upper Abdomen: There is diffuse hepatic steatosis. Visualized upper abdominal structures otherwise appear unremarkable. Musculoskeletal: Patient is status post median sternotomy. No blastic or lytic bone lesions are evident. There are no appreciable chest wall lesions.  Review of the MIP images confirms the above findings. IMPRESSION: 1. No demonstrable pulmonary embolus. No thoracic aortic aneurysm. No dissection evident; the contrast bolus in the aorta is less than optimal for dissection assessment. There is aortic atherosclerosis. Patient is status post coronary artery bypass grafting. 2. Prominence the main pulmonary outflow tract is felt to be indicative of a degree of pulmonary arterial hypertension. 3.  Bibasilar atelectasis.  No edema or consolidation. 4.  No adenopathy evident. 5. Status post removal of the right lobe of the thyroid. Mass in the left lobe of the thyroid is noted. Per consensus guidelines, a mass of this size does not warrant additional imaging surveillance. 6.  Diffuse hepatic steatosis. Aortic Atherosclerosis (ICD10-I70.0). Electronically Signed   By: Lowella Grip III M.D.   On: 09/01/2018 19:55    TTE 12-30-17 Left ventricle: The cavity size was normal. Systolic function was   mildly reduced. The estimated ejection fraction was in the range   of 45% to 50%. The study is not technically sufficient to allow   evaluation of LV diastolic function. - Regional wall motion abnormality: Akinesis of the apical   myocardium; hypokinesis of the mid anteroseptal myocardium. - Aortic valve: There was trivial regurgitation. - Mitral valve: There was no regurgitation. - Left atrium: The atrium was mildly dilated. - Right ventricle: Systolic function was mildly reduced. - Right atrium: The atrium was mildly dilated. - Atrial septum: No defect or patent foramen ovale was identified. - Tricuspid valve: There was trivial regurgitation. - Pulmonic valve: There was mild regurgitation.  Impressions:  - LV function mildly reduced with focal wall motion abnormalities.   Akinesis of the apical myocardium, hypokinesis of the mid   anteroseptal myocardium. While no clear apical thrombus   visualized, it is not excluded as echo contrast not used in  this   study.  07-12-16 LHC  LM lesion, 99 %stenosed.  Ost LAD to Prox LAD lesion, 95 %stenosed.  There is mild to moderate left ventricular systolic dysfunction.  LV end diastolic pressure is severely elevated.  The left ventricular ejection fraction is 45-50% by visual estimate.  S/p CABG 07-12-16  LIMA-LAD  ASSESSMENT AND PLAN:   1. CP: similar to prior angina. So far, negative troponin, no acute EKG changes. NPO after MN tonight; cont to cycle enzymes. If enz turn positive, will plan for LHC. If negative, can  possibly due nuc stress test for further evaluation. Will get updated TTE as well given new/worsening sx.  2. Dyslipidemia: pt has hereditary dyslipidemia; she has been on statin as well as PCSK9i w/ continued elevated LDL. Will get updated FLP  3. ICM: last EF 45-50%; will repeat TTE as above. Restart home regimen  Thank you for the opportunity to participate in the care of this patient.  For questions or updates, please contact [place] Please consult [email or web address] for contact info under   Signed,  Rudean Curt, MD, [facility]  09/01/2018 9:01 PM

## 2018-09-01 NOTE — Progress Notes (Signed)
ANTICOAGULATION CONSULT NOTE - Initial Consult  Pharmacy Consult for heparin Indication: chest pain/ACS  Allergies  Allergen Reactions  . Statins Other (See Comments)    Muscle aches with higher doses of: Crestor & Lipitor  . Bactrim Itching and Rash         Patient Measurements: Height: 5' (152.4 cm) Weight: 122 lb (55.3 kg) IBW/kg (Calculated) : 45.5 Heparin Dosing Weight: 55.3kg  Vital Signs: Temp: 98.6 F (37 C) (04/08 1709) Temp Source: Oral (04/08 1709) BP: 122/79 (04/08 2208) Pulse Rate: 54 (04/08 2208)  Labs: Recent Labs    09/01/18 1737 09/01/18 1917  HGB  --  13.1  HCT  --  39.4  PLT  --  195  CREATININE 0.72 0.76  TROPONINI <0.03  --     Estimated Creatinine Clearance: 65.6 mL/min (by C-G formula based on SCr of 0.76 mg/dL).   Medical History: Past Medical History:  Diagnosis Date  . Anemia    due to heavy periods  . Anxiety   . Coronary artery disease   . Difficult intubation 01/22/2016  . Headache   . Heart murmur    aschild antibiotics if having Dental work  . Hypocholesteremia    medical treatment  . Ischemic cardiomyopathy   . Menorrhagia   . NSTEMI (non-ST elevated myocardial infarction) ([place])   . Pelvic pain     Medications:  Infusions:  . heparin      Assessment: 77 yof presented to the ED with CP. Pt with history of CAD. To start IV heparin. Baseline CBC is WNL and she is not on anticoagulation PTA.   Goal of Therapy:  Heparin level 0.3-0.7 units/ml Monitor platelets by anticoagulation protocol: Yes   Plan:  Heparin bolus 3500 units IV x 1 Heparin gtt 700 units/hr Check a 6 hr heparin level Daily heparin level and CBC  Masiah Woody, Rande Lawman 09/01/2018,10:21 PM

## 2018-09-02 ENCOUNTER — Observation Stay (HOSPITAL_BASED_OUTPATIENT_CLINIC_OR_DEPARTMENT_OTHER): Payer: Federal, State, Local not specified - PPO

## 2018-09-02 ENCOUNTER — Encounter (HOSPITAL_COMMUNITY): Payer: Self-pay

## 2018-09-02 ENCOUNTER — Telehealth: Payer: Self-pay | Admitting: Student

## 2018-09-02 DIAGNOSIS — I361 Nonrheumatic tricuspid (valve) insufficiency: Secondary | ICD-10-CM | POA: Diagnosis not present

## 2018-09-02 DIAGNOSIS — R079 Chest pain, unspecified: Secondary | ICD-10-CM | POA: Diagnosis not present

## 2018-09-02 DIAGNOSIS — I340 Nonrheumatic mitral (valve) insufficiency: Secondary | ICD-10-CM

## 2018-09-02 DIAGNOSIS — I10 Essential (primary) hypertension: Secondary | ICD-10-CM | POA: Diagnosis present

## 2018-09-02 LAB — CBC
HCT: 37.1 % (ref 36.0–46.0)
Hemoglobin: 12.5 g/dL (ref 12.0–15.0)
MCH: 29.8 pg (ref 26.0–34.0)
MCHC: 33.7 g/dL (ref 30.0–36.0)
MCV: 88.5 fL (ref 80.0–100.0)
Platelets: 200 10*3/uL (ref 150–400)
RBC: 4.19 MIL/uL (ref 3.87–5.11)
RDW: 12.3 % (ref 11.5–15.5)
WBC: 6.4 10*3/uL (ref 4.0–10.5)
nRBC: 0 % (ref 0.0–0.2)

## 2018-09-02 LAB — ECHOCARDIOGRAM COMPLETE
Height: 60 in
Weight: 1987.21 oz

## 2018-09-02 LAB — LIPID PANEL
Cholesterol: 282 mg/dL — ABNORMAL HIGH (ref 0–200)
HDL: 36 mg/dL — ABNORMAL LOW (ref >40)
LDL Cholesterol: 231 mg/dL — ABNORMAL HIGH (ref 0–99)
Total CHOL/HDL Ratio: 7.8 RATIO
Triglycerides: 74 mg/dL (ref <150)
VLDL: 15 mg/dL (ref 0–40)

## 2018-09-02 LAB — BASIC METABOLIC PANEL
Anion gap: 12 (ref 5–15)
BUN: 16 mg/dL (ref 6–20)
CO2: 23 mmol/L (ref 22–32)
Calcium: 9.5 mg/dL (ref 8.9–10.3)
Chloride: 104 mmol/L (ref 98–111)
Creatinine, Ser: 0.85 mg/dL (ref 0.44–1.00)
GFR calc Af Amer: >60 mL/min (ref >60)
GFR calc non Af Amer: >60 mL/min (ref >60)
Glucose, Bld: 136 mg/dL — ABNORMAL HIGH (ref 70–99)
Potassium: 3.7 mmol/L (ref 3.5–5.1)
Sodium: 139 mmol/L (ref 135–145)

## 2018-09-02 LAB — HIV ANTIBODY (ROUTINE TESTING W REFLEX): HIV Screen 4th Generation wRfx: NONREACTIVE

## 2018-09-02 LAB — PROTIME-INR
INR: 1.1 (ref 0.8–1.2)
Prothrombin Time: 14 seconds (ref 11.4–15.2)

## 2018-09-02 LAB — HEPARIN LEVEL (UNFRACTIONATED)
Heparin Unfractionated: 0.44 IU/mL (ref 0.30–0.70)
Heparin Unfractionated: 0.32 IU/mL (ref 0.30–0.70)

## 2018-09-02 LAB — TROPONIN I
Troponin I: <0.03 ng/mL (ref <0.03)
Troponin I: <0.03 ng/mL (ref <0.03)

## 2018-09-02 MED ORDER — NITROGLYCERIN 0.4 MG SL SUBL: 0.4000 mg | SUBLINGUAL_TABLET | SUBLINGUAL | 6 refills | Status: DC | PRN

## 2018-09-02 MED FILL — NITROGLYCERIN 0.4 MG TAB SL: 0.4 | 7 days supply | Qty: 25 | Fill #0 | Status: TO

## 2018-09-02 NOTE — Discharge Summary (Addendum)
Discharge Summary    Patient ID: [name] MRN: [id number]; DOB: 04-27-1971  Admit date: 09/01/2018 Discharge date: 09/02/2018  Primary Care Provider: Lawerance Cruel, MD  Primary Cardiologist: Quay Burow, MD / Lyman Bishop, MD (for hyperlipidemia) Primary Electrophysiologist:  None   Discharge Diagnoses    Principal Problem:   Chest pain Active Problems:   Hyperlipidemia   Ischemic cardiomyopathy   Hypertension   Allergies Allergies  Allergen Reactions   Statins Other (See Comments)    Muscle aches with higher doses of: Crestor & Lipitor   Bactrim Itching and Rash         Diagnostic Studies/Procedures    Echocardiogram 09/02/2018:  1. The left ventricle has moderately reduced systolic function, with an ejection fraction of 35-40%. The cavity size was mildly dilated. Left ventricular diastolic parameters were normal.  2. Apical aneurysm, septal and inferior wall hypokinesis.  3. The right ventricle has normal systolic function. The cavity was normal. There is no increase in right ventricular wall thickness.  4. Mild thickening of the mitral valve leaflet.  5. The tricuspid valve is grossly normal.  6. The aortic valve is tricuspid. Mild thickening of the aortic valve. Mild calcification of the aortic valve. Aortic valve regurgitation is trivial by color flow Doppler.   History of Present Illness     [name] is a 48 year old female with a history of CAD with 99% stenosis of left main and 95% stenosis of ostial to proximal LAD s/p single vessel CABG with LIMA to LAD in 06/2016, ischemic cardiomyopathy, hypertension, and hyperlipidemia who presented to the ED on 09/01/2018 for evaluation of chest pain. Patient reports pain started on the evening of 08/31/2018 while at rest. She describes the pain as sharp, left-sided chest pain that radiated into her back and was similar to her prior angina. She notes some associated shortness of breath, diaphoresis, and nausea. The  symptoms lasted for a few minutes with intense severity and then leveled off but came and gone intermittently throughout they day yesterday prior to presentation. Patient did not try Nitroglycerin for relief (she says she did not have any to take). She denies any exertional chest pain or shortness of breath prior to this incident. EKG showed sinus bradycardia, rate 56, with no acute ischemic changes. Initial troponin negative. D-dimer minimally elevated. Chest CTA showed no evidence of pulmonary embolism or aortic dissection. Patient was admitted overnight for further evaluation.  Hospital Course     Consultants: None.  Chest Pain with Known CAD Patient was admitted for further evaluation of chest pain as stated above. Troponin negative x4. Echocardiogram showed LVEF of 35 to 40% with apical aneurysm and septal and inferior wall hypokinesis. Dr. Harrington Challenger reviewed the Echo and felt like there was no significant change from previous Echo in 12/2017. Chest pain felt to be atypical given it is long lasting at rest without any exacerbation with activity. Patient seen and examined by Dr. Harrington Challenger today and was felt to be stable for discharge. Will make sure patient has Nitroglycerin prescribed at discharge. Continue aggressive secondary prevention with Aspirin and hyperlipidemia therapy. Patient has been bradycardic at times with heart rate as low as the 40's. Therefore, will discontinue home Toprol XL. Patient was encouraged to keep tract of her blood pressure and heart rate at home and communicate to [place] through [facility]. Will arrange for a virtual follow-up visit in a couple of weeks.  Ischemic Cardiomyopathy Echo as above. Will discontinue home beta-blocker given bradycardia as  stated above. Continue home Losartan 25mg  daily and Spironolactone 25mg  daily.  Hypertension BP currently stable. Continue home Losartan and Spironolactone.   Hyperlipidemia Lipid panel this admission: Cholesterol 282, Triglycerides 74,  HDL 36, LDL 231. LDL goal is <70 given CAD. Patient has a history of familial hyperlipidemia. She has been intolerant to multiple statins in the past due to myalgias and had no significant response to Repatha. 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 Patient is followed by Dr. [name] for this. At last office visit with him, there was some discussion about retrying Praluent. Continue Zetia 10mg  daily and Juxtapid 5mg  daily. Follow-up with Dr. [name] as directed.  _____________  Discharge Vitals Blood pressure 100/61, pulse 79, temperature 98.2 F (36.8 C), temperature source Oral, resp. rate 18, height 5' (1.524 m), weight 56.3 kg, last menstrual period 08/10/2011, SpO2 98 %.  Filed Weights   09/01/18 1716 09/02/18 0004  Weight: 55.3 kg 56.3 kg    Labs & Radiologic Studies    CBC Recent Labs    09/01/18 1917 09/02/18 0449  WBC 6.0 6.4  NEUTROABS 3.3  --   HGB 13.1 12.5  HCT 39.4 37.1  MCV 89.1 88.5  PLT 195 938   Basic Metabolic Panel Recent Labs    09/01/18 1917 09/02/18 0449  NA 140 139  K 4.0 3.7  CL 104 104  CO2 25 23  GLUCOSE 124* 136*  BUN 15 16  CREATININE 0.76 0.85  CALCIUM 9.9 9.5   Liver Function Tests No results for input(s): AST, ALT, ALKPHOS, BILITOT, PROT, ALBUMIN in the last 72 hours. No results for input(s): LIPASE, AMYLASE in the last 72 hours. Cardiac Enzymes Recent Labs    09/01/18 2233 09/02/18 0449 09/02/18 1004  TROPONINI <0.03 <0.03 <0.03   BNP Invalid input(s): POCBNP D-Dimer Recent Labs    09/01/18 1736  DDIMER 0.74*   Hemoglobin A1C No results for input(s): HGBA1C in the last 72 hours. Fasting Lipid Panel Recent Labs    09/02/18 0449  CHOL 282*  HDL 36*  LDLCALC 231*  TRIG 74  CHOLHDL 7.8   Thyroid Function Tests No results for input(s): TSH, T4TOTAL, T3FREE, THYROIDAB in the last 72 hours.  Invalid input(s): FREET3 _____________  Dg Chest 2 View  Result Date:  09/01/2018 CLINICAL DATA:  Chest pain EXAM: CHEST - 2 VIEW COMPARISON:  08/30/2016 FINDINGS: Postop CABG. Heart size normal. Negative for heart failure. Lungs are clear without infiltrate or effusion. No change from the prior. IMPRESSION: No active cardiopulmonary disease. Electronically Signed   By: Franchot Gallo M.D.   On: 09/01/2018 18:22   Ct Angio Chest Pe W/cm &/or Wo Cm  Result Date: 09/01/2018 CLINICAL DATA:  Chest pain EXAM: CT ANGIOGRAPHY CHEST WITH CONTRAST TECHNIQUE: Multidetector CT imaging of the chest was performed using the standard protocol during bolus administration of intravenous contrast. Multiplanar CT image reconstructions and MIPs were obtained to evaluate the vascular anatomy. CONTRAST:  52mL OMNIPAQUE IOHEXOL 350 MG/ML SOLN COMPARISON:  CT angiogram chest August 30, 2016 and chest radiograph September 01, 2018 FINDINGS: Cardiovascular: There is no demonstrable pulmonary embolus. There is no thoracic aortic aneurysm. No dissection is seen. The contrast bolus in the aorta is less than for dissection assessment. The visualized great vessels appear unremarkable. There are scattered foci of aortic atherosclerosis. There is no pericardial effusion or pericardial thickening evident. The main pulmonary outflow tract is prominent, measuring 3.1 cm. Patient is status post coronary  artery bypass grafting. Mediastinum/Nodes: Patient has had removal of the right lobe of the thyroid. There is a nodular opacity in the left lobe measuring just over 1 cm. There is no appreciable thoracic adenopathy. No esophageal lesions are evident. Lungs/Pleura: There is mild bibasilar atelectasis. There is no appreciable edema or consolidation. No pleural effusion or pleural thickening evident. Upper Abdomen: There is diffuse hepatic steatosis. Visualized upper abdominal structures otherwise appear unremarkable. Musculoskeletal: Patient is status post median sternotomy. No blastic or lytic bone lesions are evident. There are  no appreciable chest wall lesions. Review of the MIP images confirms the above findings. IMPRESSION: 1. No demonstrable pulmonary embolus. No thoracic aortic aneurysm. No dissection evident; the contrast bolus in the aorta is less than optimal for dissection assessment. There is aortic atherosclerosis. Patient is status post coronary artery bypass grafting. 2. Prominence the main pulmonary outflow tract is felt to be indicative of a degree of pulmonary arterial hypertension. 3.  Bibasilar atelectasis.  No edema or consolidation. 4.  No adenopathy evident. 5. Status post removal of the right lobe of the thyroid. Mass in the left lobe of the thyroid is noted. Per consensus guidelines, a mass of this size does not warrant additional imaging surveillance. 6.  Diffuse hepatic steatosis. Aortic Atherosclerosis (ICD10-I70.0). Electronically Signed   By: Lowella Grip III M.D.   On: 09/01/2018 19:55   Disposition   Patient is being discharged home today in good condition.  Follow-up Plans & Appointments    Follow-up Information    [name], [place] Follow up.   Specialties:  Cardiology, Radiology Why:  You have a telehealth (video) follow-up visit scheduled for 09/21/2018 at 8:30am with [name], one of our office's PAs. Contact information: [address] [place] [address] [phone]          Discharge Instructions    Diet - low sodium heart healthy   Complete by:  As directed    Increase activity slowly   Complete by:  As directed       Discharge Medications   Allergies as of 09/02/2018      Reactions   Statins Other (See Comments)   Muscle aches with higher doses of: Crestor & Lipitor   Bactrim Itching, Rash         Medication List    STOP taking these medications   ibuprofen 200 MG tablet Commonly known as:  ADVIL,MOTRIN   metoprolol succinate 25 MG 24 hr tablet Commonly known as:  TOPROL-XL     TAKE these medications   aspirin EC 81 MG tablet Take 1  tablet (81 mg total) by mouth daily.   CoQ10 200 MG Caps Take 200 mg by mouth at bedtime.   ezetimibe 10 MG tablet Commonly known as:  ZETIA Take 1 tablet (10 mg total) by mouth daily. What changed:  when to take this   Juxtapid 5 MG Caps Generic drug:  Lomitapide Mesylate Take 5 mg by mouth at bedtime.   losartan 25 MG tablet Commonly known as:  COZAAR Take 1 tablet (25 mg total) by mouth daily. What changed:  when to take this   nitroGLYCERIN 0.4 MG SL tablet Commonly known as:  NITROSTAT Place 1 tablet (0.4 mg total) under the tongue every 5 (five) minutes x 3 doses as needed for chest pain.   OMEGA-3 PO Take 1 capsule by mouth daily with breakfast.   spironolactone 25 MG tablet Commonly known as:  ALDACTONE Take 1 tablet (25 mg  total) by mouth daily.   VITAMIN D PO Take 1 tablet by mouth daily with breakfast.        Acute coronary syndrome (MI, NSTEMI, STEMI, etc) this admission?: No.    Outstanding Labs/Studies   None  Duration of Discharge Encounter   Greater than 30 minutes including physician time.  Signed, Darreld Mclean, PA-C 09/02/2018, 12:55 PM

## 2018-09-02 NOTE — Progress Notes (Signed)
Progress Note  Patient Name: [name] Date of Encounter: 09/02/2018  Primary Cardiologist: Quay Burow, MD / [facility]  Subjective   Pt still with very mild L lateral CP   Constant  Nt pleuritic or positional in bed   No recent illness   No Cough  No one sick at home   Inpatient Medications    Scheduled Meds: . aspirin EC  81 mg Oral Daily  . ezetimibe  10 mg Oral QHS  . Lomitapide Mesylate  5 mg Oral QHS  . losartan  25 mg Oral QHS  . metoprolol succinate  12.5 mg Oral QHS  . omega-3 acid ethyl esters  1 g Oral Daily  . spironolactone  25 mg Oral Daily   Continuous Infusions: . heparin 700 Units/hr (09/02/18 0400)   PRN Meds: acetaminophen, nitroGLYCERIN, ondansetron (ZOFRAN) IV   Vital Signs    Vitals:   09/02/18 0050 09/02/18 0333 09/02/18 0334 09/02/18 0402  BP:    (!) 94/59  Pulse: (!) 45 95 86 (!) 55  Resp:    16  Temp:    97.8 F (36.6 C)  TempSrc:    Oral  SpO2:    97%  Weight:      Height:        Intake/Output Summary (Last 24 hours) at 09/02/2018 0804 Last data filed at 09/02/2018 0400 Gross per 24 hour  Intake 337.3 ml  Output 0 ml  Net 337.3 ml   Last 3 Weights 09/02/2018 09/01/2018 05/11/2018  Weight (lbs) 124 lb 3.2 oz 122 lb 127 lb  Weight (kg) 56.337 kg 55.339 kg 57.607 kg      Telemetry    SR - Personally Reviewed  ECG      Physical Exam   GEN: No acute distress.   Neck: No JVD Cardiac: RRR, no murmurs, Respiratory: Clear to auscultation  GI: Soft, nontender, non-distended  MS: No edema; . Neuro:  Nonfocal  Psych: Normal affect   Labs    Chemistry Recent Labs  Lab 09/01/18 1737 09/01/18 1917 09/02/18 0449  NA 137 140 139  K 5.2* 4.0 3.7  CL 103 104 104  CO2 24 25 23   GLUCOSE 152* 124* 136*  BUN 16 15 16   CREATININE 0.72 0.76 0.85  CALCIUM 10.0 9.9 9.5  GFRNONAA >60 >60 >60  GFRAA >60 >60 >60  ANIONGAP 10 11 12      Hematology Recent Labs  Lab 09/01/18 1917 09/02/18 0449  WBC 6.0 6.4  RBC 4.42 4.19  HGB  13.1 12.5  HCT 39.4 37.1  MCV 89.1 88.5  MCH 29.6 29.8  MCHC 33.2 33.7  RDW 12.1 12.3  PLT 195 200    Cardiac Enzymes Recent Labs  Lab 09/01/18 1737 09/01/18 2233 09/02/18 0449  TROPONINI <0.03 <0.03 <0.03   No results for input(s): TROPIPOC in the last 168 hours.   BNPNo results for input(s): BNP, PROBNP in the last 168 hours.   DDimer  Recent Labs  Lab 09/01/18 1736  DDIMER 0.74*     Radiology    Dg Chest 2 View  Result Date: 09/01/2018 CLINICAL DATA:  Chest pain EXAM: CHEST - 2 VIEW COMPARISON:  08/30/2016 FINDINGS: Postop CABG. Heart size normal. Negative for heart failure. Lungs are clear without infiltrate or effusion. No change from the prior. IMPRESSION: No active cardiopulmonary disease. Electronically Signed   By: Franchot Gallo M.D.   On: 09/01/2018 18:22   Ct Angio Chest Pe W/cm &/or Wo Cm  Result Date:  09/01/2018 CLINICAL DATA:  Chest pain EXAM: CT ANGIOGRAPHY CHEST WITH CONTRAST TECHNIQUE: Multidetector CT imaging of the chest was performed using the standard protocol during bolus administration of intravenous contrast. Multiplanar CT image reconstructions and MIPs were obtained to evaluate the vascular anatomy. CONTRAST:  41mL OMNIPAQUE IOHEXOL 350 MG/ML SOLN COMPARISON:  CT angiogram chest August 30, 2016 and chest radiograph September 01, 2018 FINDINGS: Cardiovascular: There is no demonstrable pulmonary embolus. There is no thoracic aortic aneurysm. No dissection is seen. The contrast bolus in the aorta is less than for dissection assessment. The visualized great vessels appear unremarkable. There are scattered foci of aortic atherosclerosis. There is no pericardial effusion or pericardial thickening evident. The main pulmonary outflow tract is prominent, measuring 3.1 cm. Patient is status post coronary artery bypass grafting. Mediastinum/Nodes: Patient has had removal of the right lobe of the thyroid. There is a nodular opacity in the left lobe measuring just over 1 cm.  There is no appreciable thoracic adenopathy. No esophageal lesions are evident. Lungs/Pleura: There is mild bibasilar atelectasis. There is no appreciable edema or consolidation. No pleural effusion or pleural thickening evident. Upper Abdomen: There is diffuse hepatic steatosis. Visualized upper abdominal structures otherwise appear unremarkable. Musculoskeletal: Patient is status post median sternotomy. No blastic or lytic bone lesions are evident. There are no appreciable chest wall lesions. Review of the MIP images confirms the above findings. IMPRESSION: 1. No demonstrable pulmonary embolus. No thoracic aortic aneurysm. No dissection evident; the contrast bolus in the aorta is less than optimal for dissection assessment. There is aortic atherosclerosis. Patient is status post coronary artery bypass grafting. 2. Prominence the main pulmonary outflow tract is felt to be indicative of a degree of pulmonary arterial hypertension. 3.  Bibasilar atelectasis.  No edema or consolidation. 4.  No adenopathy evident. 5. Status post removal of the right lobe of the thyroid. Mass in the left lobe of the thyroid is noted. Per consensus guidelines, a mass of this size does not warrant additional imaging surveillance. 6.  Diffuse hepatic steatosis. Aortic Atherosclerosis (ICD10-I70.0). Electronically Signed   By: Lowella Grip III M.D.   On: 09/01/2018 19:55    Cardiac Studies   Echo today     Patient Profile     48 y.o. female with hx of CAD with severe LM dz (s/p 1V CABG LIMA to LAD in 2018)  Admitted with CP    Assessment & Plan    1   Chest pain  Trop neg x 3     I have reviewed echo.   No change from previous  Apex is akinetic     CP is atypical   I am not convinced cardiac   (at rest, long lasting without exacerbation by activity)  Treat for pain    Make sure pt has NTG I reassured her    She was very concerned about HR in 40s at one point    I told her I was not concerned   Will follpw  Stop b  blocker   Keep track of BP and HR at home   COmmunicate through my chart and evisit Pt needs NTG at D/C    2   CAD   Pt s/p CABG for 99% LM/ LAD dz   3    HL   Lipids this AM   LDL 231   HDL 36     Pt is on Zetia   Has not tolerated statins due to myalgias  (though question if  tried Livalo)   No signif response to [employer] analysis shows she has a varient in folding which may explain lack of response   Discussion for Praluent.   Follwoed by  Wells Guiles     For questions or updates, please contact [facility] Please consult [email or web address] for contact info under        Signed, Dorris Carnes, MD  09/02/2018, 8:04 AM

## 2018-09-02 NOTE — Telephone Encounter (Signed)
Pt calling to follow up on repatha status.

## 2018-09-02 NOTE — Telephone Encounter (Signed)
   TELEPHONE CALL NOTE  This patient has been deemed a candidate for follow-up tele-health visit to limit community exposure during the Covid-19 pandemic. I spoke with the patient via phone to discuss instructions. This has been outlined on the patient's AVS (dotphrase: [email or web address]). The patient was advised to review the section on consent for treatment as well. The patient will receive a phone call 2-3 days prior to their E-Visit at which time consent will be verbally confirmed. A Virtual Office Visit appointment type has been scheduled for 09/21/2018 with [name], with "VIDEO" or "TELEPHONE" in the appointment notes - patient prefers Video type.  I have either confirmed the patient is active in MyChart or offered to send sign-up link to phone/email via Mychart icon beside patient's photo.  Darreld Mclean, PA-C 09/02/2018 12:26 PM

## 2018-09-02 NOTE — Progress Notes (Signed)
[facility] for Heparin  Indication: chest pain/ACS  Allergies  Allergen Reactions  . Statins Other (See Comments)    Muscle aches with higher doses of: Crestor & Lipitor  . Bactrim Itching and Rash         Patient Measurements: Height: 5' (152.4 cm) Weight: 124 lb 3.2 oz (56.3 kg)(scale B) IBW/kg (Calculated) : 45.5 Heparin Dosing Weight: 55.3kg  Vital Signs: Temp: 97.8 F (36.6 C) (04/09 0402) Temp Source: Oral (04/09 0402) BP: 94/59 (04/09 0402) Pulse Rate: 55 (04/09 0402)  Labs: Recent Labs    09/01/18 1737 09/01/18 1917 09/01/18 2233 09/02/18 0449  HGB  --  13.1  --  12.5  HCT  --  39.4  --  37.1  PLT  --  195  --  200  LABPROT  --   --   --  14.0  INR  --   --   --  1.1  HEPARINUNFRC  --   --   --  0.44  CREATININE 0.72 0.76  --   --   TROPONINI <0.03  --  <0.03  --     Estimated Creatinine Clearance: 66.1 mL/min (by C-G formula based on SCr of 0.76 mg/dL).   Medical History: Past Medical History:  Diagnosis Date  . Anemia    due to heavy periods  . Anxiety   . Coronary artery disease   . Difficult intubation 01/22/2016  . Headache   . Heart murmur    aschild antibiotics if having Dental work  . Hypocholesteremia    medical treatment  . Ischemic cardiomyopathy   . Menorrhagia   . NSTEMI (non-ST elevated myocardial infarction) ([place])   . Pelvic pain     Medications:  Infusions:  . heparin 700 Units/hr (09/02/18 0400)    Assessment: 58 yof presented to the ED with CP. Pt with history of CAD. To start IV heparin. Baseline CBC is WNL and she is not on anticoagulation PTA.   4/9 AM update: initial heparin level therapeutic   Goal of Therapy:  Heparin level 0.3-0.7 units/ml Monitor platelets by anticoagulation protocol: Yes   Plan:  Cont heparin at 700 units/hr Re-check heparin level at 1200 Daily heparin level and CBC  Narda Bonds, PharmD, BCPS Clinical Pharmacist Phone: [phone]

## 2018-09-02 NOTE — Discharge Instructions (Signed)
Recommendations: - Please stop your metoprolol succinate (Toprol XL) due to your low heart rate at times. Please continue to monitor you blood pressure and heart rate at home. If you become symptomatic with your low heart rate (lightheaded, dizzy, extreme fatigue, nausea, etc.), please contact our office and let us know. - We have prescribed you Nitroglycerin to take as needed for chest pain. - Please take all other medications as instructed elsewhere.  You have a follow-up telehealth (video) visit scheduled for 09/21/2018 at 8:30am. Please see more information about telehealth visits below:  [place]  Approximately 15-20 minutes prior to your scheduled appointment, you will receive an e-mail directly from one of our staff member's [email or web address] e-mail accounts inviting you to join a WebEx meeting.  Please do not reply to that email - simply join the [employer].  Upon joining, a member of the office staff will speak with you initially through the WebEx platform to confirm medications, vital signs for the day and any symptoms you may be experiencing.  Please have this information available prior to the time of visit start.    CONSENT FOR TELE-HEALTH VISIT - PLEASE RVIEW  I hereby voluntarily request, consent and authorize [facility] and its employed or contracted physicians, physician assistants, nurse practitioners or other licensed health care professionals (the Practitioner), to provide me with telemedicine health care services (the Services") as deemed necessary by the treating Practitioner. I acknowledge and consent to receive the Services by the Practitioner via telemedicine. I understand that the telemedicine visit will involve communicating with the Practitioner through live audiovisual communication technology and the disclosure of certain medical information by electronic transmission. I acknowledge that I have been given the opportunity to request an in-person  assessment or other available alternative prior to the telemedicine visit and am voluntarily participating in the telemedicine visit.  I understand that I have the right to withhold or withdraw my consent to the use of telemedicine in the course of my care at any time, without affecting my right to future care or treatment, and that the Practitioner or I may terminate the telemedicine visit at any time. I understand that I have the right to inspect all information obtained and/or recorded in the course of the telemedicine visit and may receive copies of available information for a reasonable fee.  I understand that some of the potential risks of receiving the Services via telemedicine include:   Delay or interruption in medical evaluation due to technological equipment failure or disruption;  Information transmitted may not be sufficient (e.g. poor resolution of images) to allow for appropriate medical decision making by the Practitioner; and/or   In rare instances, security protocols could fail, causing a breach of personal health information.  Furthermore, I acknowledge that it is my responsibility to provide information about my medical history, conditions and care that is complete and accurate to the best of my ability. I acknowledge that Practitioner's advice, recommendations, and/or decision may be based on factors not within their control, such as incomplete or inaccurate data provided by me or distortions of diagnostic images or specimens that may result from electronic transmissions. I understand that the practice of medicine is not an exact science and that Practitioner makes no warranties or guarantees regarding treatment outcomes. I acknowledge that I will receive a copy of this consent concurrently upon execution via email to the email address I last provided but may also request a printed copy by calling the office of [facility].    I understand that my insurance will be billed for this  visit.   I have read or had this consent read to me.  I understand the contents of this consent, which adequately explains the benefits and risks of the Services being provided via telemedicine.   I have been provided ample opportunity to ask questions regarding this consent and the Services and have had my questions answered to my satisfaction.  I give my informed consent for the services to be provided through the use of telemedicine in my medical care  By participating in this telemedicine visit I agree to the above.

## 2018-09-02 NOTE — Progress Notes (Signed)
  Echocardiogram 2D Echocardiogram has been performed.  Jaceion Aday G Chaim Gatley 09/02/2018, 9:20 AM

## 2018-09-02 NOTE — Telephone Encounter (Signed)
Pt state she is currently in the hospital but need to set up a f/u with Dr. [name]. Will route to Nurse.

## 2018-09-03 ENCOUNTER — Telehealth: Payer: Self-pay

## 2018-09-03 NOTE — Telephone Encounter (Signed)
LEFT MESSAGE FOR PT TO CHANGE APPT TIME TO 8AM

## 2018-09-03 NOTE — Telephone Encounter (Signed)
Spoke with pt. States would like to cancel her appt and want to be seen by Dr. [name] if possible

## 2018-09-07 ENCOUNTER — Encounter (HOSPITAL_COMMUNITY): Payer: Self-pay

## 2018-09-08 ENCOUNTER — Telehealth: Payer: Self-pay | Admitting: Cardiovascular Disease

## 2018-09-08 DIAGNOSIS — R7309 Other abnormal glucose: Secondary | ICD-10-CM | POA: Diagnosis not present

## 2018-09-08 NOTE — Telephone Encounter (Signed)
Mychart, smartphone, pre reg complete 09/08/18 AF °

## 2018-09-09 ENCOUNTER — Encounter (HOSPITAL_COMMUNITY): Payer: Self-pay

## 2018-09-13 NOTE — Telephone Encounter (Signed)
Pt states that she already has appt with Dr. [name] but wants an appt with Dr. [name] as well for cholesterol management (likely wants to be managed in lipid disorders clinic)

## 2018-09-14 ENCOUNTER — Telehealth: Payer: Self-pay | Admitting: *Deleted

## 2018-09-14 ENCOUNTER — Encounter: Payer: Self-pay | Admitting: *Deleted

## 2018-09-14 ENCOUNTER — Encounter (HOSPITAL_COMMUNITY): Payer: Self-pay

## 2018-09-14 NOTE — Telephone Encounter (Signed)
Virtual Visit Pre-Appointment Phone Call  "(Name), I am calling you today to discuss your upcoming appointment. We are currently trying to limit exposure to the virus that causes COVID-19 by seeing patients at home rather than in the office."  1. "What is the BEST phone number to call the day of the visit?" - include this in appointment notes  2. Do you have or have access to (through a family member/friend) a smartphone with video capability that we can use for your visit?" a. If yes - list this number in appt notes as cell (if different from BEST phone #) and list the appointment type as a VIDEO visit in appointment notes b. If no - list the appointment type as a PHONE visit in appointment notes  3. Confirm consent - "In the setting of the current Covid19 crisis, you are scheduled for a (phone or video) visit with your provider on (date) at (time).  Just as we do with many in-office visits, in order for you to participate in this visit, we must obtain consent.  If you'd like, I can send this to your mychart (if signed up) or email for you to review.  Otherwise, I can obtain your verbal consent now.  All virtual visits are billed to your insurance company just like a normal visit would be.  By agreeing to a virtual visit, we'd like you to understand that the technology does not allow for your provider to perform an examination, and thus may limit your provider's ability to fully assess your condition. If your provider identifies any concerns that need to be evaluated in person, we will make arrangements to do so.  Finally, though the technology is pretty good, we cannot assure that it will always work on either your or our end, and in the setting of a video visit, we may have to convert it to a phone-only visit.  In either situation, we cannot ensure that we have a secure connection.  Are you willing to proceed?" STAFF: Did the patient verbally acknowledge consent to telehealth visit? Document  YES/NO here: YES  4. Advise patient to be prepared - "Two hours prior to your appointment, go ahead and check your blood pressure, pulse, oxygen saturation, and your weight (if you have the equipment to check those) and write them all down. When your visit starts, your provider will ask you for this information. If you have an Apple Watch or Kardia device, please plan to have heart rate information ready on the day of your appointment. Please have a pen and paper handy nearby the day of the visit as well."  5. Give patient instructions for MyChart download to smartphone OR Doximity/Doxy.me as below if video visit (depending on what platform provider is using)  6. Inform patient they will receive a phone call 15 minutes prior to their appointment time (may be from unknown caller ID) so they should be prepared to answer    TELEPHONE CALL NOTE  [name] has been deemed a candidate for a follow-up tele-health visit to limit community exposure during the Covid-19 pandemic. I spoke with the patient via phone to ensure availability of phone/video source, confirm preferred email & phone number, and discuss instructions and expectations.  I reminded [name] to be prepared with any vital sign and/or heart rhythm information that could potentially be obtained via home monitoring, at the time of her visit. I reminded [name] to expect a phone call prior to her visit.  [name], [place] 09/14/2018 10:54 AM   INSTRUCTIONS FOR DOWNLOADING THE MYCHART APP TO SMARTPHONE  - The patient must first make sure to have activated MyChart and know their login information - If Apple, go to [employer] and type in MyChart in the search bar and download the app. If Android, ask patient to go to [employer] and type in [place] in the search bar and download the app. The app is free but as with any other app downloads, their phone may require them to verify saved payment information or  Apple/Android password.  - The patient will need to then log into the app with their MyChart username and password, and select [facility] as their healthcare provider to link the account. When it is time for your visit, go to the MyChart app, find appointments, and click Begin Video Visit. Be sure to Select Allow for your device to access the Microphone and Camera for your visit. You will then be connected, and your provider will be with you shortly.  **If they have any issues connecting, or need assistance please contact MyChart service desk [phone])**  **If using a computer, in order to ensure the best quality for their visit they will need to use either of the following Internet Browsers: [employer], or Google Chrome**  IF USING DOXIMITY or DOXY.ME - The patient will receive a link just prior to their visit by text.     FULL LENGTH CONSENT FOR TELE-HEALTH VISIT   I hereby voluntarily request, consent and authorize [facility] and its employed or contracted physicians, physician assistants, nurse practitioners or other licensed health care professionals (the Practitioner), to provide me with telemedicine health care services (the Services") as deemed necessary by the treating Practitioner. I acknowledge and consent to receive the Services by the Practitioner via telemedicine. I understand that the telemedicine visit will involve communicating with the Practitioner through live audiovisual communication technology and the disclosure of certain medical information by electronic transmission. I acknowledge that I have been given the opportunity to request an in-person assessment or other available alternative prior to the telemedicine visit and am voluntarily participating in the telemedicine visit.  I understand that I have the right to withhold or withdraw my consent to the use of telemedicine in the course of my care at any time, without affecting my right to future care  or treatment, and that the Practitioner or I may terminate the telemedicine visit at any time. I understand that I have the right to inspect all information obtained and/or recorded in the course of the telemedicine visit and may receive copies of available information for a reasonable fee.  I understand that some of the potential risks of receiving the Services via telemedicine include:   Delay or interruption in medical evaluation due to technological equipment failure or disruption;  Information transmitted may not be sufficient (e.g. poor resolution of images) to allow for appropriate medical decision making by the Practitioner; and/or   In rare instances, security protocols could fail, causing a breach of personal health information.  Furthermore, I acknowledge that it is my responsibility to provide information about my medical history, conditions and care that is complete and accurate to the best of my ability. I acknowledge that Practitioner's advice, recommendations, and/or decision may be based on factors not within their control, such as incomplete or inaccurate data provided by me or distortions of diagnostic images or specimens that may result from electronic transmissions. I understand that  the practice of medicine is not an exact science and that Practitioner makes no warranties or guarantees regarding treatment outcomes. I acknowledge that I will receive a copy of this consent concurrently upon execution via email to the email address I last provided but may also request a printed copy by calling the office of [facility].    I understand that my insurance will be billed for this visit.   I have read or had this consent read to me.  I understand the contents of this consent, which adequately explains the benefits and risks of the Services being provided via telemedicine.   I have been provided ample opportunity to ask questions regarding this consent and the Services and have had  my questions answered to my satisfaction.  I give my informed consent for the services to be provided through the use of telemedicine in my medical care  By participating in this telemedicine visit I agree to the above.      Cardiac Questionnaire:    Since your last visit or hospitalization:    1. Have you been having new or worsening chest pain? YES   2. Have you been having new or worsening shortness of breath? YES 3. Have you been having new or worsening leg swelling, wt gain, or increase in abdominal girth (pants fitting more tightly)? NO 4. Have you had any passing out spells? NO    *A YES to any of these questions would result in the appointment being kept. *If all the answers to these questions are NO, we should indicate that given the current situation regarding the worldwide coronarvirus pandemic, at the recommendation of the CDC, we are looking to limit gatherings in our waiting area, and thus will reschedule their appointment beyond four weeks from today.   _____________   COVID-19 Pre-Screening Questions:   Do you currently have a fever? NO  Have you recently travelled on a cruise, internationally, or to [place], [place], [place], [place], [place], or [place], [place] [employer]) ? NO  Have you been in contact with someone that is currently pending confirmation of Covid19 testing or has been confirmed to have the [place] virus?  NO  Are you currently experiencing fatigue or cough? NO    Spoke with patient and we reviewed her history, medications, allergies, and pharmacy.

## 2018-09-14 NOTE — Telephone Encounter (Signed)
Smartphone/ my chart/ virtual consent/ pre reg completed °

## 2018-09-15 ENCOUNTER — Telehealth: Payer: Self-pay

## 2018-09-15 ENCOUNTER — Telehealth (INDEPENDENT_AMBULATORY_CARE_PROVIDER_SITE_OTHER): Payer: Federal, State, Local not specified - PPO | Admitting: Cardiovascular Disease

## 2018-09-15 VITALS — HR 80 | Ht 59.0 in | Wt 126.0 lb

## 2018-09-15 DIAGNOSIS — Z951 Presence of aortocoronary bypass graft: Secondary | ICD-10-CM

## 2018-09-15 DIAGNOSIS — E7800 Pure hypercholesterolemia, unspecified: Secondary | ICD-10-CM

## 2018-09-15 DIAGNOSIS — I255 Ischemic cardiomyopathy: Secondary | ICD-10-CM | POA: Diagnosis not present

## 2018-09-15 DIAGNOSIS — I2510 Atherosclerotic heart disease of native coronary artery without angina pectoris: Secondary | ICD-10-CM

## 2018-09-15 DIAGNOSIS — E785 Hyperlipidemia, unspecified: Secondary | ICD-10-CM

## 2018-09-15 DIAGNOSIS — E7801 Familial hypercholesterolemia: Secondary | ICD-10-CM

## 2018-09-15 MED ORDER — METOPROLOL SUCCINATE ER 25 MG PO TB24: 25.0000 mg | ORAL_TABLET | Freq: Every day | ORAL | 3 refills | Status: DC

## 2018-09-15 NOTE — Telephone Encounter (Signed)
LMTCB regarding AVS instructions. AVS released to MyChart.

## 2018-09-15 NOTE — Progress Notes (Signed)
Virtual Visit via Video Note   This visit type was conducted due to national recommendations for restrictions regarding the COVID-19 Pandemic (e.g. social distancing) in an effort to limit this patient's exposure and mitigate transmission in our community.  Due to her co-morbid illnesses, this patient is at least at moderate risk for complications without adequate follow up.  This format is felt to be most appropriate for this patient at this time.  All issues noted in this document were discussed and addressed.  A limited physical exam was performed with this format.  Please refer to the patient's chart for her consent to telehealth for [facility].   Evaluation Performed:  Follow-up visit  Date:  09/15/2018   ID:  [name], DOB February 10, 1971, MRN [id number]  Patient Location: Home Provider Location: Home  PCP:  Lawerance Cruel, MD  Cardiologist:  Quay Burow, MD  Electrophysiologist:  None   Chief Complaint: First post hospital discharge office visit  History of Present Illness:    [name] is a 48 y.o.  mildly overweight married Caucasian female mother of one 77 year old daughter. I last saw her in the 01/15/2018.She underwentcoronary artery bypass grafting times one emergently by Dr. Cyndia Bent on 07/12/16. The remainder of her coronary arteries were unremarkable. She did have a non-STEMI on that day with progressively rising troponins and EKG changes. She had a 99% ostial LAD with anteroapical wall motion abnormality. She is recuperating nicely. She is about to start cardiac rehabilitation.She denies chest pain or shortness of breath. She does complain of some cramping in her legs which may be statin-related. Her last echo performed 10/21/16 revealed ejection fraction of 40-45% with wall motion and a all motion abnormality in the LAD distribution. She was begun on carvedilol which apparently she did not tolerate. She was alsoplaced on Repathawhich did not affect her lipid  profileas well as Praluent.She was referred to Dr. Reubin Milan for genetic testing for presumed FH. Repeat 2-D echo performed 05/06/17 revealed an EF of 40-45% with anteroapical hypokinesia.  I did refer her to Dr. [name] for advanced lipid evaluation therapy.  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  She was unresponsive to statin therapy and PCSK9.  She is now being tried on Juxtapid.  I also sent her to Dr. [name] in the advanced heart failure clinic.  She is on optimal medical therapy for this.  She is now NYHA class I-2 is relatively asymptomatic with an EF of 45 to 50%.  Since I saw her in the office 8 months ago she had been doing well until earlier this month when she developed chest pain.  She was admitted 09/01/2018 for 1 day.  She ruled out for myocardial infarction.  She had no acute EKG changes.  A 2D echo was performed that showed an EF in the 35 to 40% range with apical aneurysm and inferior hypokinesia.  She was seen by Dr. Harrington Challenger and sent home for noncardiac chest pain.  Since being at home she is been asymptomatic.  She was taken off her beta-blocker because of bradycardia on telemetry although now she complains of tachycardia.  I am going to place her back on her low-dose Toprol-XL 12.5 mg a day.    The patient does not have symptoms concerning for COVID-19 infection (fever, chills, cough, or new shortness of breath).    Past Medical History:  Diagnosis Date  . Anemia    due to heavy periods  . Anxiety   . Coronary artery  disease   . Difficult intubation 01/22/2016  . Headache   . Heart murmur    aschild antibiotics if having Dental work  . Hypocholesteremia    medical treatment  . Ischemic cardiomyopathy   . Menorrhagia   . NSTEMI (non-ST elevated myocardial infarction) ([place])   . Pelvic pain    Past Surgical History:  Procedure Laterality Date  . ABDOMINAL HYSTERECTOMY    . CORONARY ARTERY BYPASS GRAFT N/A 07/12/2016   Procedure: CORONARY ARTERY BYPASS GRAFTING  (CABG), ON PUMP, TIMES ONE, USING LEFT INTERNAL MAMMARY ARTERY WITH TEE;  Surgeon: Gaye Pollack, MD;  Location: [facility] OR;  Service: Open Heart Surgery;  Laterality: N/A;  LIMA to LAD  . goiter     left side goiter removed  . INGUINAL HERNIA REPAIR Bilateral 01/22/2016   Procedure: LAPAROSCOPIC BILATERAL FEMORAL AND RIGHT INGUINAL HERNIA REPAIRWITH INSERTION OF MESH;  Surgeon: Michael Boston, MD;  Location: [facility];  Service: General;  Laterality: Bilateral;  . LEFT HEART CATH AND CORONARY ANGIOGRAPHY N/A 07/12/2016   Procedure: Left Heart Cath and Coronary Angiography;  Surgeon: Lorretta Harp, MD;  Location: [facility];  Service: Cardiovascular;  Laterality: N/A;  . SVD     x 1  . WISDOM TOOTH EXTRACTION       Current Meds  Medication Sig  . aspirin 81 MG tablet Take 1 tablet (81 mg total) by mouth daily.  . Coenzyme Q10 (COQ10) 200 MG CAPS Take 200 mg by mouth at bedtime.   Marland Kitchen ezetimibe (ZETIA) 10 MG tablet Take 1 tablet (10 mg total) by mouth daily. (Patient taking differently: Take 10 mg by mouth at bedtime. )  . Lomitapide Mesylate (JUXTAPID) 5 MG CAPS Take 5 mg by mouth at bedtime.  . nitroGLYCERIN (NITROSTAT) 0.4 MG SL tablet Place 1 tablet (0.4 mg total) under the tongue every 5 (five) minutes x 3 doses as needed for chest pain.  . Omega-3 Fatty Acids (OMEGA-3 PO) Take 1 capsule by mouth daily with breakfast.  . spironolactone (ALDACTONE) 25 MG tablet Take 1 tablet (25 mg total) by mouth daily.  Marland Kitchen VITAMIN D PO Take 1 tablet by mouth daily with breakfast.     Allergies:   Statins and Bactrim   Social History   Tobacco Use  . Smoking status: Never Smoker  . Smokeless tobacco: Never Used  Substance Use Topics  . Alcohol use: No  . Drug use: No     Family Hx: The patient's family history is not on file. She was adopted.  ROS:   Please see the history of present illness.     All other systems reviewed and are negative.   Prior CV studies:   The following studies  were reviewed today:  2D echocardiogram performed 09/02/2018  Labs/Other Tests and Data Reviewed:    EKG:  No ECG reviewed.  Recent Labs: 10/21/2017: B Natriuretic Peptide 34.7 08/10/2018: ALT 128 09/02/2018: BUN 16; Creatinine, Ser 0.85; Hemoglobin 12.5; Platelets 200; Potassium 3.7; Sodium 139   Recent Lipid Panel Lab Results  Component Value Date/Time   CHOL 282 (H) 09/02/2018 04:49 AM   CHOL 274 (H) 03/30/2018 11:53 AM   TRIG 74 09/02/2018 04:49 AM   HDL 36 (L) 09/02/2018 04:49 AM   HDL 30 (L) 03/30/2018 11:53 AM   CHOLHDL 7.8 09/02/2018 04:49 AM   LDLCALC 231 (H) 09/02/2018 04:49 AM   LDLCALC 224 (H) 03/30/2018 11:53 AM    Wt Readings from Last 3 Encounters:  09/15/18  126 lb (57.2 kg)  09/02/18 124 lb 3.2 oz (56.3 kg)  05/11/18 127 lb (57.6 kg)     Objective:    Vital Signs:  Pulse 80   Ht 4\' 11"  (1.499 m)   Wt 126 lb (57.2 kg)   LMP 08/10/2011   BMI 25.45 kg/m    VITAL SIGNS:  reviewed GEN:  no acute distress RESPIRATORY:  normal respiratory effort, symmetric expansion NEURO:  alert and oriented x 3, no obvious focal deficit PSYCH:  normal affect  ASSESSMENT & PLAN:    1. Coronary artery disease- history of CAD status post non-STEMI with cardiac catheterization performed revealing a 90% ostial LAD with anterior apical wall motion normality.  She underwent CABG x1 emergently by Dr. Cyndia Bent 07/12/2016 with a LIMA to her LAD.  Her EF has remained persistently depressed in the 40% range with an apical wall motion abnormality.  She was recently mated on 09/01/2018 with atypical chest pain and ruled out for myocardial infarction.  She is had no recurrent symptoms. 2. Hyperlipidemia- history of hyperlipidemia with a genetic variant and intolerant to statin drugs and PCSK9 medications.  She sees Dr. [name] in the advanced lipid clinic and is currently on [place] and [place].  Her last lipid profile performed 09/02/2018 revealed total cholesterol 282, LDL 231 and HDL 36. 3. Ischemic  cardiomyopathy- EF of 35 to 40% by recent echo 09/02/2018.  She is on losartan and spironolactone.  Her beta-blocker was stopped because of bradycardia on telemetry although she is somewhat symptomatic with regards to palpitations and have elected to put her back on Toprol-XL 12.5 mg a day.  COVID-19 Education: The signs and symptoms of COVID-19 were discussed with the patient and how to seek care for testing (follow up with PCP or arrange E-visit).  The importance of social distancing was discussed today.  Time:   Today, I have spent 15 minutes with the patient with telehealth technology discussing the above problems.     Medication Adjustments/Labs and Tests Ordered: Current medicines are reviewed at length with the patient today.  Concerns regarding medicines are outlined above.   Tests Ordered: No orders of the defined types were placed in this encounter.   Medication Changes: No orders of the defined types were placed in this encounter.   Disposition:  Follow up in 6 month(s)  Signed, Quay Burow, MD  09/15/2018 10:10 AM    [place]

## 2018-09-15 NOTE — Patient Instructions (Signed)
Medication Instructions:  Your physician has recommended you make the following change in your medication:  RESUME METOPROLOL SUCCINATE (TOPROL-XL) 25 MG BY MOUTH DAILY.  If you need a refill on your cardiac medications before your next appointment, please call your pharmacy.   Lab work: NONE If you have labs (blood work) drawn today and your tests are completely normal, you will receive your results only by: Marland Kitchen MyChart Message (if you have MyChart) OR . A paper copy in the mail If you have any lab test that is abnormal or we need to change your treatment, we will call you to review the results.  Testing/Procedures: NONE  Follow-Up: At [facility], you and your health needs are our priority.  As part of our continuing mission to provide you with exceptional heart care, we have created designated Provider Care Teams.  These Care Teams include your primary Cardiologist (physician) and Advanced Practice Providers (APPs -  Physician Assistants and Nurse Practitioners) who all work together to provide you with the care you need, when you need it. You will need a follow up appointment in 6 months WITH DR. [name].  Please call our office 2 months in advance to schedule this appointment.    Any Other Special Instructions Will Be Listed Below (If Applicable). REFERRAL TO DR. Chrissie Noa HILTY IN THE AD[facility]ID [facility]ORD[facility] CLINIC. YOU WILL BE CONTACTED BY A SCHEDULER TO SET UP THIS APPOINTMENT.

## 2018-09-16 ENCOUNTER — Encounter (HOSPITAL_COMMUNITY): Payer: Self-pay

## 2018-09-17 NOTE — Telephone Encounter (Signed)
Message sent to schedulers to contact pt and schedule appointment.

## 2018-09-20 ENCOUNTER — Other Ambulatory Visit: Payer: Self-pay

## 2018-09-20 DIAGNOSIS — R945 Abnormal results of liver function studies: Secondary | ICD-10-CM | POA: Diagnosis not present

## 2018-09-20 DIAGNOSIS — E785 Hyperlipidemia, unspecified: Secondary | ICD-10-CM | POA: Diagnosis not present

## 2018-09-20 DIAGNOSIS — R7989 Other specified abnormal findings of blood chemistry: Secondary | ICD-10-CM

## 2018-09-20 DIAGNOSIS — Z5181 Encounter for therapeutic drug level monitoring: Secondary | ICD-10-CM

## 2018-09-20 LAB — LIPID PANEL
CHOLESTEROL TOTAL: 277 mg/dL — AB (ref 100–199)
Chol/HDL Ratio: 9.2 ratio — ABNORMAL HIGH (ref 0.0–4.4)
HDL: 30 mg/dL — AB (ref >39)
LDL CALC: 222 mg/dL — AB (ref 0–99)
Triglycerides: 126 mg/dL (ref 0–149)
VLDL CHOLESTEROL CAL: 25 mg/dL (ref 5–40)

## 2018-09-20 LAB — HEPATIC FUNCTION PANEL
ALT: 92 IU/L — ABNORMAL HIGH (ref 0–32)
AST: 45 IU/L — ABNORMAL HIGH (ref 0–40)
Albumin: 4.4 g/dL (ref 3.8–4.8)
Alkaline Phosphatase: 61 IU/L (ref 39–117)
Bilirubin Total: 0.5 mg/dL (ref 0.0–1.2)
Bilirubin, Direct: 0.1 mg/dL (ref 0.00–0.40)
Total Protein: 6.7 g/dL (ref 6.0–8.5)

## 2018-09-21 ENCOUNTER — Telehealth: Payer: Federal, State, Local not specified - PPO | Admitting: Physician Assistant

## 2018-09-21 ENCOUNTER — Encounter (HOSPITAL_COMMUNITY): Payer: Self-pay

## 2018-09-21 DIAGNOSIS — M65311 Trigger thumb, right thumb: Secondary | ICD-10-CM | POA: Diagnosis not present

## 2018-09-23 ENCOUNTER — Encounter (HOSPITAL_COMMUNITY): Payer: Self-pay

## 2018-09-29 ENCOUNTER — Telehealth: Payer: Self-pay | Admitting: Internal Medicine

## 2018-09-29 ENCOUNTER — Encounter: Payer: Self-pay | Admitting: Internal Medicine

## 2018-09-29 ENCOUNTER — Telehealth (INDEPENDENT_AMBULATORY_CARE_PROVIDER_SITE_OTHER): Payer: Federal, State, Local not specified - PPO | Admitting: Internal Medicine

## 2018-09-29 VITALS — BP 110/70 | HR 65 | Ht 60.0 in | Wt 124.0 lb

## 2018-09-29 DIAGNOSIS — R748 Abnormal levels of other serum enzymes: Secondary | ICD-10-CM

## 2018-09-29 DIAGNOSIS — Z951 Presence of aortocoronary bypass graft: Secondary | ICD-10-CM

## 2018-09-29 DIAGNOSIS — I2510 Atherosclerotic heart disease of native coronary artery without angina pectoris: Secondary | ICD-10-CM

## 2018-09-29 DIAGNOSIS — E7801 Familial hypercholesterolemia: Secondary | ICD-10-CM

## 2018-09-29 DIAGNOSIS — Z7189 Other specified counseling: Secondary | ICD-10-CM | POA: Diagnosis not present

## 2018-09-29 MED ORDER — BEMPEDOIC ACID 180 MG PO TABS: 180.0000 mg | ORAL_TABLET | Freq: Every day | ORAL | 3 refills | Status: DC

## 2018-09-29 MED ORDER — BEMPEDOIC ACID 180 MG PO TABS: 180.0000 mg | ORAL_TABLET | Freq: Every day | ORAL | 11 refills | Status: DC

## 2018-09-29 MED ORDER — EVOLOCUMAB 140 MG/ML ~~LOC~~ SOAJ: 1.0000 | SUBCUTANEOUS | 11 refills | Status: DC

## 2018-09-29 NOTE — Telephone Encounter (Signed)
Patient has telemedicine OV with MD today.

## 2018-09-29 NOTE — Telephone Encounter (Signed)
Follow Up:    Returning your call, she has an appt this morning.

## 2018-09-29 NOTE — Progress Notes (Signed)
Virtual Visit via Video Note   This visit type was conducted due to national recommendations for restrictions regarding the COVID-19 Pandemic (e.g. social distancing) in an effort to limit this patient's exposure and mitigate transmission in our community.  Due to her co-morbid illnesses, this patient is at least at moderate risk for complications without adequate follow up.  This format is felt to be most appropriate for this patient at this time.  All issues noted in this document were discussed and addressed.  A limited physical exam was performed with this format.  Please refer to the patient's chart for her consent to telehealth for [facility]   Evaluation Performed:  Doximity video visit  Date:  09/29/2018   ID:  [name], DOB 03-25-1971, MRN [id number]  Patient Location:  [place] [address]  Provider location:   [address] [place], [place] [address]  PCP:  Lawerance Cruel, MD  Cardiologist:  Quay Burow, MD Electrophysiologist:  None   Chief Complaint:  No complaints  History of Present Illness:    [name] is a 48 y.o. female who presents via audio/video conferencing for a telehealth visit today.  [name] was seen today for video visit follow-up.  Unfortunately, despite placing her on Juxtapid, we have not seen significant reduction in her lipid profile.  Her LDL is down about 30%, however her liver enzymes continue to go up and down but remain less than 3 times the upper limit of normal.  I have been hesitant to further increase Juxtapid due to this concern.  In looking back at her lipid profiles in the past, her lipids were best around the time just following her MI and bypass surgery.  She was on high potency atorvastatin but had significant adverse side effects.  She was then changed to Tolu and had an LDL at one point as low as 176.  Subsequently it has crept up back to 280.  It was felt that the addition of Repatha  to a statin represented a failure or incomplete response, however on further review, it seems that she was taken off of the statin and trialed on a low-dose of rosuvastatin which she did not tolerate, therefore the lipid-lowering response of Repatha was as monotherapy not in addition to the statin.  Overall, this suggest that she may have the best response with regards to lipid lowering on a PCSK9 inhibitor.  Unfortunately, it seems that [place] is contraindicated with PCSK9 inhibitors.  I cannot figure out the rationale behind this other than it is not recommended by insurance companies and therefore we were denied the addition of PCSK9 to [place].  The patient does not have symptoms concerning for COVID-19 infection (fever, chills, cough, or new SHORTNESS OF BREATH).    Prior CV studies:   The following studies were reviewed today:  Lab work  PMHx:  Past Medical History:  Diagnosis Date  . Anemia    due to heavy periods  . Anxiety   . Coronary artery disease   . Difficult intubation 01/22/2016  . Headache   . Heart murmur    aschild antibiotics if having Dental work  . Hypocholesteremia    medical treatment  . Ischemic cardiomyopathy   . Menorrhagia   . NSTEMI (non-ST elevated myocardial infarction) ([place])   . Pelvic pain     Past Surgical History:  Procedure Laterality Date  . ABDOMINAL HYSTERECTOMY    . CORONARY ARTERY BYPASS GRAFT N/A 07/12/2016  Procedure: CORONARY ARTERY BYPASS GRAFTING (CABG), ON PUMP, TIMES ONE, USING LEFT INTERNAL MAMMARY ARTERY WITH TEE;  Surgeon: Gaye Pollack, MD;  Location: [facility];  Service: Open Heart Surgery;  Laterality: N/A;  LIMA to LAD  . goiter     left side goiter removed  . INGUINAL HERNIA REPAIR Bilateral 01/22/2016   Procedure: LAPAROSCOPIC BILATERAL FEMORAL AND RIGHT INGUINAL HERNIA REPAIRWITH INSERTION OF MESH;  Surgeon: Michael Boston, MD;  Location: [facility];  Service: General;  Laterality: Bilateral;  . LEFT HEART CATH AND CORONARY  ANGIOGRAPHY N/A 07/12/2016   Procedure: Left Heart Cath and Coronary Angiography;  Surgeon: Lorretta Harp, MD;  Location: [facility];  Service: Cardiovascular;  Laterality: N/A;  . SVD     x 1  . WISDOM TOOTH EXTRACTION      FAMHx:  Family History  Adopted: Yes    SOCHx:   reports that she has never smoked. She has never used smokeless tobacco. She reports that she does not drink alcohol or use drugs.  ALLERGIES:  Allergies  Allergen Reactions  . Statins Other (See Comments)    Muscle aches with higher doses of: Crestor & Lipitor  . Bactrim Itching and Rash         MEDS:  Current Meds  Medication Sig  . aspirin 81 MG tablet Take 1 tablet (81 mg total) by mouth daily.  . Coenzyme Q10 (COQ10) 200 MG CAPS Take 200 mg by mouth at bedtime.   Marland Kitchen ezetimibe (ZETIA) 10 MG tablet Take 1 tablet (10 mg total) by mouth daily. (Patient taking differently: Take 10 mg by mouth at bedtime. )  . losartan (COZAAR) 25 MG tablet Take 1 tablet (25 mg total) by mouth daily. (Patient taking differently: Take 25 mg by mouth at bedtime. )  . metoprolol succinate (TOPROL XL) 25 MG 24 hr tablet Take 1 tablet (25 mg total) by mouth daily.  . nitroGLYCERIN (NITROSTAT) 0.4 MG SL tablet Place 1 tablet (0.4 mg total) under the tongue every 5 (five) minutes x 3 doses as needed for chest pain.  . Omega-3 Fatty Acids (OMEGA-3 PO) Take 1 capsule by mouth daily with breakfast.  . spironolactone (ALDACTONE) 25 MG tablet Take 1 tablet (25 mg total) by mouth daily.  Marland Kitchen VITAMIN D PO Take 1 tablet by mouth daily with breakfast.  . [DISCONTINUED] Bempedoic Acid (NEXLETOL PO) Take by mouth daily.  . [DISCONTINUED] Evolocumab (REPATHA SURECLICK) 419 MG/ML SOAJ Inject into the skin.  . [DISCONTINUED] Lomitapide Mesylate (JUXTAPID) 5 MG CAPS Take 5 mg by mouth at bedtime.     ROS: Pertinent items noted in HPI and remainder of comprehensive ROS otherwise negative.  Labs/Other Tests and Data Reviewed:     Recent Labs: 10/21/2017: B Natriuretic Peptide 34.7 09/02/2018: BUN 16; Creatinine, Ser 0.85; Hemoglobin 12.5; Platelets 200; Potassium 3.7; Sodium 139 09/20/2018: ALT 92   Recent Lipid Panel Lab Results  Component Value Date/Time   CHOL 277 (H) 09/20/2018 10:02 AM   TRIG 126 09/20/2018 10:02 AM   HDL 30 (L) 09/20/2018 10:02 AM   CHOLHDL 9.2 (H) 09/20/2018 10:02 AM   CHOLHDL 7.8 09/02/2018 04:49 AM   LDLCALC 222 (H) 09/20/2018 10:02 AM    Wt Readings from Last 3 Encounters:  09/29/18 124 lb (56.2 kg)  09/15/18 126 lb (57.2 kg)  09/02/18 124 lb 3.2 oz (56.3 kg)     Exam:    Vital Signs:  BP 110/70   Pulse 65   Ht  5' (1.524 m)   Wt 124 lb (56.2 kg)   LMP 08/10/2011   BMI 24.22 kg/m    General appearance: alert and no distress Lungs: No audible wheezes or visual respiratory difficulty Extremities: extremities normal, atraumatic, no cyanosis or edema Skin: Skin color, texture, turgor normal. No rashes or lesions Neurologic: Mental status: Alert, oriented, thought content appropriate Psych: Pleasant  ASSESSMENT & PLAN:    1. Familial hyperlipidemia -genetic mutation in the LDL receptor 2. Coronary artery disease status post single-vessel CABG 3. History of MI 4. Statin intolerance  [name] has not had an overwhelming response to Juxtapid with regards to lipid lowering.  She is on the lowest dose and it is complicated as she has had elevated liver enzymes.  I do not feel that we could push her dose up any higher.  In the past it was thought that she had incomplete response to PCSK9 inhibitors, it seems that the medication was started around the same time her statin was discontinued therefore the drop in cholesterol probably was as much as 50%.  This is an improvement over her current cholesterol reduction on Juxtapid.  Since these 2 medications cannot be used in combination, I am now recommending we discontinue Juxtapid and her standing monthly liver tests.  We will pursue the  addition of Repatha.  In addition, since she has been statin intolerant, we now have the option of adding bempedoic acid (Nexletol) to her ezetimibe for additional lipid lowering.  I suspect will tolerate this without significant side effects.  Nexletol 180 mg daily.  We will also strive for prior authorization for Repatha.  COVID-19 Education: The signs and symptoms of COVID-19 were discussed with the patient and how to seek care for testing (follow up with PCP or arrange E-visit).  The importance of social distancing was discussed today.  Patient Risk:   After full review of this patients clinical status, I feel that they are at least moderate risk at this time.  Time:   Today, I have spent 25 minutes with the patient with telehealth technology discussing lipid management, familial hyperlipidemia, extensive review of past lipid values in the chart for several years..     Medication Adjustments/Labs and Tests Ordered: Current medicines are reviewed at length with the patient today.  Concerns regarding medicines are outlined above.   Tests Ordered: Orders Placed This Encounter  Procedures  . Lipid panel    Medication Changes: Meds ordered this encounter  Medications  . DISCONTD: Bempedoic Acid (NEXLETOL) 180 MG TABS    Sig: Take 180 mg by mouth daily.    Dispense:  30 tablet    Refill:  11  . Evolocumab (REPATHA SURECLICK) 540 MG/ML SOAJ    Sig: Inject 1 Dose into the skin every 14 (fourteen) days.    Dispense:  2 pen    Refill:  11  . Bempedoic Acid (NEXLETOL) 180 MG TABS    Sig: Take 180 mg by mouth daily.    Dispense:  180 tablet    Refill:  3    Patient to supply copay card. Please process for 90 day supply    Disposition:  in 4 month(s)  Pixie Casino, MD, [facility], [place] Director of the Advanced Lipid Disorders &  Cardiovascular Risk Reduction Clinic Diplomate of the American Board of Clinical Lipidology Attending Cardiologist   Direct Dial: [phone]  Fax: [phone]  Website:  [email or web address]  Pixie Casino, MD  09/29/2018 9:18 PM

## 2018-09-29 NOTE — Telephone Encounter (Signed)
LVM to schedule 4 month followup with Dr. [name].

## 2018-09-29 NOTE — Patient Instructions (Addendum)
Medication Instructions:  Dr. [name] recommends the following med changes: -- START Repatha 140mg /mL (injectable cholesterol medication) - once every 2 weeks -- START Nexletol (bempedoic acid)  -- STOP juxtapid  We will need to complete a prior authorization process for these medications. We will be in contact about the approval status.   Please use this website to access a copay card for Nexletol ([email or web address]  Please use this website to access a copay card for [place] ([email or web address])  If you need a refill on your cardiac medications before your next appointment, please call your pharmacy.   Lab work: FASTING lab work in about 3-4 months to assess cholesterol  You can STOP monthly liver function testing  If you have labs (blood work) drawn today and your tests are completely normal, you will receive your results only by: Marland Kitchen MyChart Message (if you have MyChart) OR . A paper copy in the mail If you have any lab test that is abnormal or we need to change your treatment, we will call you to review the results.  Testing/Procedures: NONE needed  Follow-Up: Dr. [name] recommends that you schedule a follow up visit with him the in the [facility] in 3-4 months.  Please have fasting blood work about 1 week prior to this visit and he will review the blood work results with you at your appointment.

## 2018-10-01 ENCOUNTER — Telehealth: Payer: Self-pay | Admitting: *Deleted

## 2018-10-01 ENCOUNTER — Telehealth: Payer: Self-pay | Admitting: Internal Medicine

## 2018-10-01 NOTE — Telephone Encounter (Signed)
LM for patient to discuss next steps for prior auths for repatha & nexletol

## 2018-10-01 NOTE — Telephone Encounter (Signed)
Called [employer] to get PA for Nexletol 180 mg. Was told by representative that because this medication is not on their formulary a PA could not be done. The MD can go to [email or web address] and print off a managed formulary exception form/ new to market form. Complete and fax it in. Staff message has been sent to [facility] for follow up and completion.

## 2018-10-01 NOTE — Telephone Encounter (Signed)
Returned call to patient and provided her update on Repatha and Nexletol. She will also stop Juxtapid ans outlined in MD note. She is aware monthly LFT testing is not needed. Notified her that I will check with MD on how he wants to proceed with Nexletol - patient can use co-pay card for 90 day supply @ pharmacy without a prior auth on file (per CVRR). She voiced understanding.   Routed message to Dr. [name] for review of lipid management updates

## 2018-10-01 NOTE — Telephone Encounter (Signed)
Follow Up:; ° ° °Returning your call. °

## 2018-10-01 NOTE — Telephone Encounter (Signed)
I think we can try the Nexletol if she can afford it - may get a discount with card and we could give samples. Hopefully, will go on formulary by then.   Dr. Lemmie Evens

## 2018-10-01 NOTE — Telephone Encounter (Signed)
Message sent to patient via MyChart. 

## 2018-10-01 NOTE — Telephone Encounter (Addendum)
Attempted PA for Repatha via [email or web address] and was unable to do so. Called [employer] @ [phone] to initiate PA for [place] but since this medication has been under review for a period of time with them (as it was originally being rx'ed in combination with juxtapid) the insurance company is requesting medical records be faxed to [phone]. Sent staff message to medical records department to fax last MD note 09/29/2018 and last lipid panel 09/20/2018 and MD letter to [facility] FEP with cover sheet denoting name, DOB, [id number] (ID)  Also asked [employer] FEP rep about completing PA for Nexletol. Was notified THIS IS NOT yet on their medication list thus a PA cannot be completed. Patient can still get medication but insurance coverage for it is NOT available at this time  Will notify MD

## 2018-10-12 ENCOUNTER — Telehealth: Payer: Self-pay | Admitting: Internal Medicine

## 2018-10-12 ENCOUNTER — Encounter: Payer: Self-pay | Admitting: Internal Medicine

## 2018-10-12 MED ORDER — EVOLOCUMAB 140 MG/ML ~~LOC~~ SOAJ: 1.0000 | SUBCUTANEOUS | 3 refills | Status: DC

## 2018-10-12 NOTE — Telephone Encounter (Addendum)
Received fax noticed from [facility] that Repatha [employer] has been approved from 09/04/2018 - 10/04/2019. Rx for Repatha was sent to [facility] on 09/29/2018  Per patient's MyChart message, she was unable to use Nexletol card at pharmacy as it is only a co-pay card.

## 2018-10-12 NOTE — Telephone Encounter (Signed)
[employer] FEP "new to market exception" form printed for MD completion for Nexletol prescription.  This will be faxed to [phone] Per [employer] FEP rep, medication will be processed at tier 3 co-pay

## 2018-10-12 NOTE — Addendum Note (Signed)
Addended by: Fidel Levy on: 10/12/2018 09:34 AM   Modules accepted: Orders

## 2018-10-12 NOTE — Telephone Encounter (Signed)
Faxed to [facility] FEP [phone] form for nexletol Rx

## 2018-10-13 ENCOUNTER — Other Ambulatory Visit (HOSPITAL_COMMUNITY): Payer: Self-pay | Admitting: Internal Medicine

## 2018-10-20 NOTE — Telephone Encounter (Signed)
Per patient MyChart message, Nexletol has been approved. Rx was previously sent to [facility]. Patient to notify office if a different pharmacy is preferred. Medication is processed at tier 3.

## 2018-11-22 ENCOUNTER — Telehealth (HOSPITAL_COMMUNITY): Payer: Self-pay | Admitting: *Deleted

## 2018-11-22 NOTE — Telephone Encounter (Signed)
Left message for patient regarding the maintenance Cardiac Rehab exercise classes.  They remain on hold at this time due to the COVID-19 pandemic precautions.

## 2018-12-24 DIAGNOSIS — F4310 Post-traumatic stress disorder, unspecified: Secondary | ICD-10-CM | POA: Diagnosis not present

## 2019-01-12 DIAGNOSIS — F4310 Post-traumatic stress disorder, unspecified: Secondary | ICD-10-CM | POA: Diagnosis not present

## 2019-01-18 DIAGNOSIS — E7801 Familial hypercholesterolemia: Secondary | ICD-10-CM | POA: Diagnosis not present

## 2019-01-18 LAB — LIPID PANEL
Chol/HDL Ratio: 2.6 ratio (ref 0.0–4.4)
Cholesterol, Total: 100 mg/dL (ref 100–199)
HDL: 38 mg/dL — ABNORMAL LOW (ref >39)
LDL Calculated: 26 mg/dL (ref 0–99)
Triglycerides: 181 mg/dL — ABNORMAL HIGH (ref 0–149)
VLDL Cholesterol Cal: 36 mg/dL (ref 5–40)

## 2019-01-19 ENCOUNTER — Other Ambulatory Visit: Payer: Self-pay

## 2019-01-19 ENCOUNTER — Encounter: Payer: Self-pay | Admitting: Internal Medicine

## 2019-01-19 ENCOUNTER — Ambulatory Visit: Payer: Federal, State, Local not specified - PPO | Admitting: Internal Medicine

## 2019-01-19 VITALS — BP 102/64 | HR 54 | Temp 97.7°F | Ht 60.0 in | Wt 130.8 lb

## 2019-01-19 DIAGNOSIS — Z951 Presence of aortocoronary bypass graft: Secondary | ICD-10-CM

## 2019-01-19 DIAGNOSIS — R748 Abnormal levels of other serum enzymes: Secondary | ICD-10-CM

## 2019-01-19 DIAGNOSIS — E7801 Familial hypercholesterolemia: Secondary | ICD-10-CM

## 2019-01-19 DIAGNOSIS — I2510 Atherosclerotic heart disease of native coronary artery without angina pectoris: Secondary | ICD-10-CM

## 2019-01-19 LAB — HEPATIC FUNCTION PANEL
ALT: 36 IU/L — ABNORMAL HIGH (ref 0–32)
AST: 34 IU/L (ref 0–40)
Albumin: 5 g/dL — ABNORMAL HIGH (ref 3.8–4.8)
Alkaline Phosphatase: 53 IU/L (ref 39–117)
Bilirubin Total: 0.4 mg/dL (ref 0.0–1.2)
Bilirubin, Direct: 0.15 mg/dL (ref 0.00–0.40)
Total Protein: 7.3 g/dL (ref 6.0–8.5)

## 2019-01-19 NOTE — Progress Notes (Signed)
OFFICE NOTE  Chief Complaint:  Follow-up dyslipidemia  Primary Care Physician: Lawerance Cruel, MD  HPI:  [name] is a 48 y.o. female with a past medial history significant for emergent single-vessel coronary artery bypass grafting with a 99% ostial LAD stenosis in February 2018.  She was not found to have any other significant obstructive coronary disease at the time.  Echo in May 2018 revealed an LVEF of 40-45% and an LAD wall motion abnormality.  He is a patient of Dr. [name].  She has an extensive history of dyslipidemia and was told by her uncle when she was a child in [place] that her cholesterol was very high and that she would likely need to be on treatment.  She is adopted and does not know her birth parents, but does have a daughter who is 31 years old.  She has not had cholesterol testing to her knowledge.  She had been started on statins, but reports significant intolerance, this is to high-dose atorvastatin, rosuvastatin, and possibly others that she cannot recall.  She was then referred to start [place].  Her lipid profile as of July 2018 showed total cholesterol 246, triglycerides 237, HDL 35 and LDL 164.  After starting therapy on Repatha, she received 4 doses, approximately 2 months of therapy and her repeat lipid profile showed a total cholesterol 265, triglycerides 220, LDL 176 and HDL of 45.  This is suggesting that she has had no response to the PCS canine inhibitor.  She reports compliance with the medication.  She had no side effects from it.  Possible reasons for this include a PCSK9 gain of function mutation or LDL receptor mutation.  Findings are possibly suggestive of familial hyperlipidemia although her LDL is moderately elevated.  Initially the plan was to consider switching her to Praluent and she has been provided samples, however, it is likely that she will have a similar poor response.  10/30/2017  [name] returns today for follow-up of dyslipidemia.  There is  clinical concern for familial hyperlipidemia and she does carry that diagnosis.  Unfortunately as previously mentioned she did not respond to PCSK9 inhibitor.  She did proceed with genetic testing after genetic counseling by Dr. Lattie Corns. 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  This finding is a pathogenic variant that does not allow the LDL receptor to fold properly and it is therefore poorly trafficked out of the golgi apparatus and rarely is expressed on the surface of the liver.  Based on this finding, it is understandable why a PCSK9 inhibitor which works by inhibiting the PCSK9 co-protein associated with surface LDL receptors would be ineffective as the total number of surface LDL receptors are extremely low.  We further discussed this mechanism and other strategies which may be helpful to lower her cholesterol.  She was again tried on pravastatin by the advanced heart failure clinic pharmacist, however had significant myalgias with this and may have some myopathy.  She says today that she is just now being able to straighten out the fingers on her hands.  At this point there are few additional options although she remains at high risk of recurrent events.  Her most recent cholesterol from several months ago showed total cholesterol 305, triglycerides 202, HDL 47 and LDL-C of 218.  Remaining options for her include LDL apheresis, for which she is likely a good candidate however is associated with significant cost and is cumbersome and would require her to likely  travel several times a month to either [facility] or [place].  The other option would be lomitapide.  By mechanism of inhibiting MTP, lomitapide should be effective at lowering her overall lipid profile by 30 to 40%, but is associated with significant GI side effects and liver toxicity.  This will need to be monitored closely and requires prescription by a provider who has completed the REMS  program (which I am certified).  02/12/2018  [name] returns today for follow-up.  We have been working very diligently over the past several months to try to get her approval for lomitapide.  After multiple appeal she has finally been approved.  She indicates that she may be able to get this for no co-pay.  We will again need to repeat a lipid profile and liver enzymes as a baseline since is been 3 months since her last study and she would anticipate starting the medication next week.  We will need to monitor her liver enzymes very closely.  She is also made significant dietary changes at the direction of the nutritionist for [place] who is been working with her telephonically on the importance of changing her diet.  05/11/2018  [name] returns today for follow-up.  She seems to be tolerating Juxtapid, but does have some issues with joint pain which is been a longstanding problem.  We are adhering to the rems program, and she is having monthly liver enzyme tests.  She has known steatohepatitis and has had elevated liver enzymes in the past.  I am pleased to report her lipids have improved.  Her total cholesterol came down from 379-274 with triglycerides down from 300-200 and LDL from 284 to 224.  Unfortunately her liver enzymes are climbing.  Over the past 6 months her ALT is climbed from 54 to 155 and AST from 34 to 65.  The ALT is now about 5 times upper limit normal.  I discussed this with her at this point and concerned that she may not be able to tolerate the medication.  Guidelines for the manufacturer suggest decreasing the dose however she is on the lowest dose.  I discussed this further with our pharmacist Tana Coast, Pharm.D., who reviewed the pharmacokinetics of the drug and given its longer half-life of close to 48 hours felt that the drug could be effectively dosed every other day and perhaps still maintain steady state while reducing the possibility of side effects.  In addition, [name] suffers  from arthritic pain and recently has had an upper respiratory infection which worsened her symptoms.  She has been taking Tylenol fairly regularly for this since she was once told not to take nonsteroidals due to her heart disease.  She did not realize that the Tylenol could worsen her liver enzyme abnormalities.  01/19/2019  [name] is seen today in follow-up.  Overall she seems to be doing quite well.  Although we had been maintaining her on Juxtapid, I was disappointed in the minimal reduction in her lipids.  This was at the trade off of elevated liver enzymes.  Initially there was some concern about coadministration of Tylenol which we discontinued and it did improve her numbers however the liver enzymes did not allow [place] to uptitrate the medication.  Her cholesterol really did not get a meaningful reduction.  Previously it was noted that she had had some benefit although not as much as expected with a PCSK9 inhibitor.  I recommended that we restart Repatha to see if that would be of benefit.  In addition with the recent introduction of Nexletol, I felt this could also add to lipid lowering.  It is well-tolerated and since she has had statin side effects I felt that would be appropriate for her.  I am pleased to report she has had a significant reduction in her lipid profile.  Her LDL cholesterol is reduced from 222 down to 26.  Total cholesterol has come down to 100 from 277.  HDL is increased from 30-38 and there has been a small increase in triglycerides from 126-181.  Some of this may be dietary and related to decreased exercise that she cannot participate in cardiac rehab at this time and they are moving to [facility] therefore have been eating out more.  She had reported some cramping in her hands which is also resolved off of Juxtapid.  PMHx:  Past Medical History:  Diagnosis Date   Anemia    due to heavy periods   Anxiety    Coronary artery disease    Difficult intubation 01/22/2016    Headache    Heart murmur    aschild antibiotics if having Dental work   Hypocholesteremia    medical treatment   Ischemic cardiomyopathy    Menorrhagia    NSTEMI (non-ST elevated myocardial infarction) ([place])    Pelvic pain     Past Surgical History:  Procedure Laterality Date   ABDOMINAL HYSTERECTOMY     CORONARY ARTERY BYPASS GRAFT N/A 07/12/2016   Procedure: CORONARY ARTERY BYPASS GRAFTING (CABG), ON PUMP, TIMES ONE, USING LEFT INTERNAL MAMMARY ARTERY WITH TEE;  Surgeon: Gaye Pollack, MD;  Location: [facility];  Service: Open Heart Surgery;  Laterality: N/A;  LIMA to LAD   goiter     left side goiter removed   INGUINAL HERNIA REPAIR Bilateral 01/22/2016   Procedure: LAPAROSCOPIC BILATERAL FEMORAL AND RIGHT INGUINAL HERNIA REPAIRWITH INSERTION OF MESH;  Surgeon: Michael Boston, MD;  Location: [facility];  Service: General;  Laterality: Bilateral;   LEFT HEART CATH AND CORONARY ANGIOGRAPHY N/A 07/12/2016   Procedure: Left Heart Cath and Coronary Angiography;  Surgeon: Lorretta Harp, MD;  Location: [facility];  Service: Cardiovascular;  Laterality: N/A;   SVD     x 1   WISDOM TOOTH EXTRACTION      FAMHx:  Family History  Adopted: Yes    SOCHx:   reports that she has never smoked. She has never used smokeless tobacco. She reports that she does not drink alcohol or use drugs.  ALLERGIES:  Allergies  Allergen Reactions   Statins Other (See Comments)    Muscle aches with higher doses of: Crestor & Lipitor   Bactrim Itching and Rash         ROS: Pertinent items noted in HPI and remainder of comprehensive ROS otherwise negative.  HOME MEDS: Current Outpatient Medications on File Prior to Visit  Medication Sig Dispense Refill   aspirin 81 MG tablet Take 1 tablet (81 mg total) by mouth daily.     Bempedoic Acid (NEXLETOL) 180 MG TABS Take 180 mg by mouth daily. 180 tablet 3   Coenzyme Q10 (COQ10) 200 MG CAPS Take 200 mg by mouth at bedtime.       Evolocumab (REPATHA SURECLICK) XX123456 MG/ML SOAJ Inject 1 Dose into the skin every 14 (fourteen) days. 6 pen 3   ezetimibe (ZETIA) 10 MG tablet TAKE 1 TABLET BY MOUTH DAILY. 30 tablet 5   losartan (COZAAR) 25 MG tablet TAKE 1 TABLET BY MOUTH DAILY. [place]andiver  tablet 5   metoprolol succinate (TOPROL XL) 25 MG 24 hr tablet Take 1 tablet (25 mg total) by mouth daily. 90 tablet 3   nitroGLYCERIN (NITROSTAT) 0.4 MG SL tablet Place 1 tablet (0.4 mg total) under the tongue every 5 (five) minutes x 3 doses as needed for chest pain. 30 tablet 6   Omega-3 Fatty Acids (OMEGA-3 PO) Take 1 capsule by mouth daily with breakfast.     spironolactone (ALDACTONE) 25 MG tablet Take 1 tablet (25 mg total) by mouth daily. 30 tablet 5   VITAMIN D PO Take 1 tablet by mouth daily with breakfast.     No current facility-administered medications on file prior to visit.     LABS/IMAGING: Results for orders placed or performed in visit on 09/29/18 (from the past 48 hour(s))  Lipid panel     Status: Abnormal   Collection Time: 01/18/19 10:39 AM  Result Value Ref Range   Cholesterol, Total 100 100 - 199 mg/dL   Triglycerides 181 (H) 0 - 149 mg/dL   HDL 38 (L) >39 mg/dL   VLDL Cholesterol Cal 36 5 - 40 mg/dL   LDL Calculated 26 0 - 99 mg/dL    Comment: **Effective January 24, 2019, [facility] is implementing an improved** equation to calculate Low Density Lipoprotein Cholesterol (LDL-C) concentrations, to be used in all lipid panels that report calculated LDL-C. This equation was developed through a collaboration with the [employer], Lung and [place] (NIH).[1] The NIH calculation overcomes the limitations of the existing [place] LDL-C equation and performs equally well in both fasting and non-fasting individuals. 1. Pauline Good Q, et al. A new equation for calculation of low-density lipoprotein cholesterol in patients with normolipidemia and/or hypertriglyceridemia. JAMA Cardiol. 2020  Feb 26. doi:10.1001/jamacardio.2020.0013    Chol/HDL Ratio 2.6 0.0 - 4.4 ratio    Comment:                                   T. Chol/HDL Ratio                                             Men  Women                               1/2 Avg.Risk  3.4    3.3                                   Avg.Risk  5.0    4.4                                2X Avg.Risk  9.6    7.1                                3X Avg.Risk 23.4   11.0    No results found.  LIPID PANEL:    Component Value Date/Time   CHOL 100 01/18/2019 1039   TRIG 181 (H) 01/18/2019 1039   HDL 38 (L) 01/18/2019 1039   CHOLHDL 2.6  01/18/2019 1039   CHOLHDL 7.8 09/02/2018 0449   VLDL 15 09/02/2018 0449   LDLCALC 26 01/18/2019 1039     WEIGHTS: Wt Readings from Last 3 Encounters:  01/19/19 130 lb 12.8 oz (59.3 kg)  09/29/18 124 lb (56.2 kg)  09/15/18 126 lb (57.2 kg)    VITALS: BP 102/64    [name] (!) 54    Temp 97.7 F (36.5 C)    Ht 5' (1.524 m)    Wt 130 lb 12.8 oz (59.3 kg)    LMP 08/10/2011    SpO2 99%    BMI 25.55 kg/m   EXAM: General appearance: alert and no distress Neck: no carotid bruit, no JVD and thyroid not enlarged, symmetric, no tenderness/mass/nodules Lungs: clear to auscultation bilaterally Heart: regular rate and rhythm, S1, S2 normal, no murmur, click, rub or gallop Abdomen: soft, non-tender; bowel sounds normal; no masses,  no organomegaly Extremities: extremities normal, atraumatic, no cyanosis or edema Pulses: 2+ and symmetric Skin: Skin color, texture, turgor normal. No rashes or lesions Neurologic: Grossly normal Psych: Pleasant  EKG: Deferred  ASSESSMENT: 1. Coronary artery disease status post single-vessel CABG to the LAD 2. Ischemic cardiomyopathy, EF 40-45% 3. 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 Documented statin intolerance 5. Hepatic steatosis-elevated liver enzymes  PLAN: 1.   Mrs. Budinger finally has had meaningful reduction in her lipid profile.  She is  now at target LDL less than 70 with an overall total cholesterol 100 and seems to be tolerating the combination of Repatha and Nexletol.  The reduction in cholesterol was more than expected in addition to the fact that her diet is actually probably a little worse than it had been previously and she is exercising less due to the [place] pandemic.  Perhaps the Nexletol has been more beneficial due to its effects higher up in the liver metabolic pathway of cholesterol, or it may be that the PCSK9 inhibitor is better absorbed as she has been injecting it into her thigh rather than her abdomen.  Nonetheless I am pleased with the results I would recommend to continue to work on her diet, restarting exercise and plan to follow-up with lipids in 6 months.  Pixie Casino, MD, [facility], [place] Director of the Advanced Lipid Disorders &  Cardiovascular Risk Reduction Clinic Attending Cardiologist  Direct Dial: [phone]   Fax: [phone]  Website:  [email or web address] 01/19/2019, 12:24 PM

## 2019-01-19 NOTE — Patient Instructions (Signed)
Medication Instructions:  Your physician recommends that you continue on your current medications as directed. Please refer to the Current Medication list given to you today.  If you need a refill on your cardiac medications before your next appointment, please call your pharmacy.   Lab work: Liver Function Test today  FASTING lab work prior to your next visit -- lab order will be mailed If you have labs (blood work) drawn today and your tests are completely normal, you will receive your results only by: Marland Kitchen MyChart Message (if you have MyChart) OR . A paper copy in the mail If you have any lab test that is abnormal or we need to change your treatment, we will call you to review the results.  Testing/Procedures: NONE  Follow-Up: Dr. [name] recommends that you schedule a follow up visit with him the in the [facility] in 6 months. Please have fasting blood work about 1 week prior to this visit and he will review the blood work results with you at your appointment.  -- please call our office December 2020 to schedule for February 2021

## 2019-02-08 ENCOUNTER — Other Ambulatory Visit: Payer: Self-pay | Admitting: Internal Medicine

## 2019-02-10 ENCOUNTER — Telehealth (HOSPITAL_COMMUNITY): Payer: Self-pay | Admitting: Family Medicine

## 2019-02-22 ENCOUNTER — Other Ambulatory Visit: Payer: Self-pay | Admitting: Internal Medicine

## 2019-03-04 ENCOUNTER — Ambulatory Visit: Payer: Federal, State, Local not specified - PPO | Admitting: Cardiovascular Disease

## 2019-03-04 ENCOUNTER — Encounter: Payer: Self-pay | Admitting: Cardiovascular Disease

## 2019-03-04 ENCOUNTER — Other Ambulatory Visit: Payer: Self-pay

## 2019-03-04 VITALS — BP 96/78 | HR 60 | Temp 97.5°F | Ht 59.0 in | Wt 134.0 lb

## 2019-03-04 DIAGNOSIS — E7849 Other hyperlipidemia: Secondary | ICD-10-CM

## 2019-03-04 DIAGNOSIS — I255 Ischemic cardiomyopathy: Secondary | ICD-10-CM | POA: Diagnosis not present

## 2019-03-04 DIAGNOSIS — Z951 Presence of aortocoronary bypass graft: Secondary | ICD-10-CM

## 2019-03-04 DIAGNOSIS — Z008 Encounter for other general examination: Secondary | ICD-10-CM | POA: Diagnosis not present

## 2019-03-04 NOTE — Assessment & Plan Note (Signed)
History of ischemic cardiomyopathy with an EF in the 35 to 40% range with an apical aneurysm by recent 2D echo performed 09/01/2018

## 2019-03-04 NOTE — Progress Notes (Signed)
03/04/2019 [name]   28-Oct-1970  [id number]  Primary Physician Lawerance Cruel, MD Primary Cardiologist: Lorretta Harp MD Lupe Carney, [place]  HPI:  [name] is a 48 y.o.  mildly overweight married Caucasian female mother of one 62 year old daughter. I last saw her by video virtual telemedicine visit on  09/15/2018.She underwentcoronary artery bypass grafting times one emergently by Dr. Cyndia Bent on 07/12/16. The remainder of her coronary arteries were unremarkable. She did have a non-STEMI on that day with progressively rising troponins and EKG changes. She had a 99% ostial LAD with anteroapical wall motion abnormality. She is recuperating nicely. She is about to start cardiac rehabilitation.She denies chest pain or shortness of breath. She does complain of some cramping in her legs which may be statin-related. Her last echo performed 10/21/16 revealed ejection fraction of 40-45% with wall motion and a all motion abnormality in the LAD distribution. She was begun on carvedilol which apparently she did not tolerate. She was alsoplaced on Repathawhich did not affect her lipid profileas well as Praluent.She was referred to Dr. Reubin Milan for genetic testing for presumed FH. Repeat 2-D echo performed 05/06/17 revealed an EF of 40-45% with anteroapical hypokinesia.  I did refer her to Dr. [name] for advanced lipid evaluation therapy. 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 She was unresponsive to statin therapy and PCSK9. She is now being tried on Juxtapid. I also sent her to Dr. [name] in the advanced heart failure clinic. She is on optimal medical therapy for this. She is now NYHA class I-2 is relatively asymptomatic with an EF of 45 to 50%.    She was admitted 09/01/2018 for 1 day.  She ruled out for myocardial infarction.  She had no acute EKG changes.  A 2D echo was performed that showed an EF in the 35 to 40% range with apical aneurysm and inferior hypokinesia.  She  was seen by Dr. Harrington Challenger and sent home for noncardiac chest pain.  Since being at home she is been asymptomatic.  She was taken off her beta-blocker because of bradycardia on telemetry although now she complains of tachycardia.    I did place her back on a beta-blocker  Since I saw her 5 months ago she continues to do well.  She is asymptomatic.  Her only complaint is some sternal discomfort when she sneezes.  Amazingly, her most recent lipid profile performed 01/18/2019 reveals a total cholesterol 100, LDL 26 and HDL of 38 followed by Dr. [name] in the lipid clinic.  Current Meds  Medication Sig  . aspirin 81 MG tablet Take 1 tablet (81 mg total) by mouth daily.  . Coenzyme Q10 (COQ10) 200 MG CAPS Take 200 mg by mouth at bedtime.   . Evolocumab (REPATHA SURECLICK) [id number] MG/ML SOAJ Inject 1 Dose into the skin every 14 (fourteen) days.  Marland Kitchen ezetimibe (ZETIA) 10 MG tablet TAKE 1 TABLET BY MOUTH EVERY DAY  . losartan (COZAAR) 25 MG tablet TAKE 1 TABLET BY MOUTH EVERY DAY  . metoprolol succinate (TOPROL XL) 25 MG 24 hr tablet Take 1 tablet (25 mg total) by mouth daily.  Marland Kitchen NEXLETOL 180 MG TABS TAKE 1 TABLET BY MOUTH DAILY  . nitroGLYCERIN (NITROSTAT) 0.4 MG SL tablet Place 1 tablet (0.4 mg total) under the tongue every 5 (five) minutes x 3 doses as needed for chest pain.  . Omega-3 Fatty Acids (OMEGA-3 PO) Take 1 capsule by mouth daily with breakfast.  . spironolactone (ALDACTONE) 25  MG tablet Take 1 tablet (25 mg total) by mouth daily.  Marland Kitchen VITAMIN D PO Take 1 tablet by mouth daily with breakfast.     Allergies  Allergen Reactions  . Statins Other (See Comments)    Muscle aches with higher doses of: Crestor & Lipitor  . Bactrim Itching and Rash         Social History   Socioeconomic History  . Marital status: Married    Spouse name: Not on file  . Number of children: Not on file  . Years of education: Not on file  . Highest education level: Not on file  Occupational History  . Not on file   Social Needs  . Financial resource strain: Not on file  . Food insecurity    Worry: Not on file    Inability: Not on file  . Transportation needs    Medical: Not on file    Non-medical: Not on file  Tobacco Use  . Smoking status: Never Smoker  . Smokeless tobacco: Never Used  Substance and Sexual Activity  . Alcohol use: No  . Drug use: No  . Sexual activity: Yes    Birth control/protection: None  Lifestyle  . Physical activity    Days per week: Not on file    Minutes per session: Not on file  . Stress: Not on file  Relationships  . Social Herbalist on phone: Not on file    Gets together: Not on file    Attends religious service: Not on file    Active member of club or organization: Not on file    Attends meetings of clubs or organizations: Not on file    Relationship status: Not on file  . Intimate partner violence    Fear of current or ex partner: Not on file    Emotionally abused: Not on file    Physically abused: Not on file    Forced sexual activity: Not on file  Other Topics Concern  . Not on file  Social History Narrative  . Not on file     Review of Systems: General: negative for chills, fever, night sweats or weight changes.  Cardiovascular: negative for chest pain, dyspnea on exertion, edema, orthopnea, palpitations, paroxysmal nocturnal dyspnea or shortness of breath Dermatological: negative for rash Respiratory: negative for cough or wheezing Urologic: negative for hematuria Abdominal: negative for nausea, vomiting, diarrhea, bright red blood per rectum, melena, or hematemesis Neurologic: negative for visual changes, syncope, or dizziness All other systems reviewed and are otherwise negative except as noted above.    Blood pressure 96/78, pulse 60, temperature (!) 97.5 F (36.4 C), height 4\' 11"  (1.499 m), weight 134 lb (60.8 kg), last menstrual period 08/10/2011.  General appearance: alert and no distress Neck: no adenopathy, no carotid  bruit, no JVD, supple, symmetrical, trachea midline and thyroid not enlarged, symmetric, no tenderness/mass/nodules Lungs: clear to auscultation bilaterally Heart: Soft outflow tract murmur Extremities: extremities normal, atraumatic, no cyanosis or edema Pulses: 2+ and symmetric Skin: Skin color, texture, turgor normal. No rashes or lesions Neurologic: Alert and oriented X 3, normal strength and tone. Normal symmetric reflexes. Normal coordination and gait  EKG sinus rhythm at 60 with septal Q waves and nonspecific ST and T wave changes.  I personally reviewed this EKG.  ASSESSMENT AND PLAN:   S/P CABG x 1 History of CAD status post non-STEMI 08/09/2016 with cath revealing a 99% ostial LAD.  She underwent emergent CABG x1 with  a LIMA to her LAD by Dr. Cyndia Bent.  EF has persistently remained in a 35 to 40% range with apical aneurysm up to 45% at some points.  She is on good medical therapy.  I did refer her to Dr. [name] for optimization of her pharmacology.  She denies chest pain or shortness of breath.  Familial hyperlipidemia History of familial hyperlipidemia with a genetic variant currently on Repatha, Zetia and Nexletol with a lipid profile performed 01/18/2019 revealing cholesterol 100, LDL 26 and HDL 38.  Ischemic cardiomyopathy History of ischemic cardiomyopathy with an EF in the 35 to 40% range with an apical aneurysm by recent 2D echo performed 09/01/2018      Lorretta Harp MD [facility], [facility] 03/04/2019 2:11 PM

## 2019-03-04 NOTE — Assessment & Plan Note (Signed)
History of CAD status post non-STEMI 08/09/2016 with cath revealing a 99% ostial LAD.  She underwent emergent CABG x1 with a LIMA to her LAD by Dr. Cyndia Bent.  EF has persistently remained in a 35 to 40% range with apical aneurysm up to 45% at some points.  She is on good medical therapy.  I did refer her to Dr. [name] for optimization of her pharmacology.  She denies chest pain or shortness of breath.

## 2019-03-04 NOTE — Patient Instructions (Signed)

## 2019-03-04 NOTE — Assessment & Plan Note (Signed)
History of familial hyperlipidemia with a genetic variant currently on Repatha, Zetia and Nexletol with a lipid profile performed 01/18/2019 revealing cholesterol 100, LDL 26 and HDL 38.

## 2019-03-22 ENCOUNTER — Encounter (HOSPITAL_COMMUNITY)
Admission: RE | Admit: 2019-03-22 | Discharge: 2019-03-22 | Disposition: A | Discharge status: Home / Self Care | Payer: Self-pay | Source: Ambulatory Visit | Attending: Internal Medicine | Admitting: Internal Medicine

## 2019-03-22 ENCOUNTER — Other Ambulatory Visit: Payer: Self-pay

## 2019-03-22 DIAGNOSIS — Z951 Presence of aortocoronary bypass graft: Secondary | ICD-10-CM | POA: Insufficient documentation

## 2019-03-22 NOTE — Progress Notes (Addendum)
[name] presented today for her first day of exercise in the cardiac rehab maintenance program.   Entrance Vitals Blood Pressure: 105/71 Heart Rate: 73  Additional Comments: Oriented patient to maintenance exercise routine. Patient tolerated exercise well.  Sol Passer, MS, ACSM CEP 03/22/2019 0830

## 2019-03-24 ENCOUNTER — Encounter (HOSPITAL_COMMUNITY)
Admission: RE | Admit: 2019-03-24 | Discharge: 2019-03-24 | Disposition: A | Discharge status: Home / Self Care | Payer: Self-pay | Source: Ambulatory Visit | Attending: Internal Medicine | Admitting: Internal Medicine

## 2019-03-24 ENCOUNTER — Other Ambulatory Visit: Payer: Self-pay

## 2019-03-29 ENCOUNTER — Other Ambulatory Visit: Payer: Self-pay

## 2019-03-29 ENCOUNTER — Encounter (HOSPITAL_COMMUNITY)
Admission: RE | Admit: 2019-03-29 | Discharge: 2019-03-29 | Disposition: A | Discharge status: Home / Self Care | Payer: Federal, State, Local not specified - PPO | Source: Ambulatory Visit | Attending: Internal Medicine | Admitting: Internal Medicine

## 2019-03-29 DIAGNOSIS — Z951 Presence of aortocoronary bypass graft: Secondary | ICD-10-CM | POA: Insufficient documentation

## 2019-03-30 ENCOUNTER — Other Ambulatory Visit: Payer: Self-pay

## 2019-03-30 DIAGNOSIS — Z20822 Contact with and (suspected) exposure to covid-19: Secondary | ICD-10-CM

## 2019-03-31 ENCOUNTER — Encounter (HOSPITAL_COMMUNITY): Payer: Self-pay

## 2019-03-31 LAB — NOVEL CORONAVIRUS, NAA: SARS-CoV-2, NAA: NOT DETECTED

## 2019-04-05 ENCOUNTER — Encounter (HOSPITAL_COMMUNITY): Payer: Self-pay

## 2019-04-07 ENCOUNTER — Encounter (HOSPITAL_COMMUNITY)
Admission: RE | Admit: 2019-04-07 | Discharge: 2019-04-07 | Disposition: A | Discharge status: Home / Self Care | Payer: Federal, State, Local not specified - PPO | Source: Ambulatory Visit | Attending: Internal Medicine | Admitting: Internal Medicine

## 2019-04-07 ENCOUNTER — Other Ambulatory Visit: Payer: Self-pay | Admitting: Internal Medicine

## 2019-04-07 ENCOUNTER — Other Ambulatory Visit: Payer: Self-pay

## 2019-04-12 ENCOUNTER — Encounter (HOSPITAL_COMMUNITY): Payer: Self-pay

## 2019-04-14 ENCOUNTER — Other Ambulatory Visit: Payer: Self-pay

## 2019-04-14 ENCOUNTER — Encounter (HOSPITAL_COMMUNITY)
Admission: RE | Admit: 2019-04-14 | Discharge: 2019-04-14 | Disposition: A | Discharge status: Home / Self Care | Payer: Self-pay | Source: Ambulatory Visit | Attending: Internal Medicine | Admitting: Internal Medicine

## 2019-04-18 ENCOUNTER — Encounter

## 2019-04-19 ENCOUNTER — Encounter (HOSPITAL_COMMUNITY)
Admission: RE | Admit: 2019-04-19 | Discharge: 2019-04-19 | Disposition: A | Discharge status: Home / Self Care | Payer: Self-pay | Source: Ambulatory Visit | Attending: Internal Medicine | Admitting: Internal Medicine

## 2019-04-19 ENCOUNTER — Other Ambulatory Visit: Payer: Self-pay

## 2019-04-26 ENCOUNTER — Encounter (HOSPITAL_COMMUNITY)
Admission: RE | Admit: 2019-04-26 | Discharge: 2019-04-26 | Disposition: A | Discharge status: Home / Self Care | Payer: Self-pay | Source: Ambulatory Visit | Attending: Internal Medicine | Admitting: Internal Medicine

## 2019-04-26 ENCOUNTER — Other Ambulatory Visit: Payer: Self-pay

## 2019-04-26 DIAGNOSIS — Z951 Presence of aortocoronary bypass graft: Secondary | ICD-10-CM | POA: Insufficient documentation

## 2019-04-28 ENCOUNTER — Other Ambulatory Visit: Payer: Self-pay

## 2019-04-28 ENCOUNTER — Encounter (HOSPITAL_COMMUNITY)
Admission: RE | Admit: 2019-04-28 | Discharge: 2019-04-28 | Disposition: A | Discharge status: Home / Self Care | Payer: Self-pay | Source: Ambulatory Visit | Attending: Internal Medicine | Admitting: Internal Medicine

## 2019-05-03 ENCOUNTER — Encounter (HOSPITAL_COMMUNITY): Payer: Self-pay

## 2019-05-05 ENCOUNTER — Other Ambulatory Visit: Payer: Self-pay

## 2019-05-05 ENCOUNTER — Encounter (HOSPITAL_COMMUNITY)
Admission: RE | Admit: 2019-05-05 | Discharge: 2019-05-05 | Disposition: A | Discharge status: Home / Self Care | Payer: Federal, State, Local not specified - PPO | Source: Ambulatory Visit | Attending: Internal Medicine | Admitting: Internal Medicine

## 2019-05-10 ENCOUNTER — Encounter (HOSPITAL_COMMUNITY): Payer: Self-pay

## 2019-05-11 ENCOUNTER — Other Ambulatory Visit: Payer: Self-pay | Admitting: Internal Medicine

## 2019-05-11 ENCOUNTER — Ambulatory Visit: Payer: Federal, State, Local not specified - PPO | Attending: Internal Medicine

## 2019-05-11 DIAGNOSIS — Z20822 Contact with and (suspected) exposure to covid-19: Secondary | ICD-10-CM

## 2019-05-11 DIAGNOSIS — Z20828 Contact with and (suspected) exposure to other viral communicable diseases: Secondary | ICD-10-CM | POA: Diagnosis not present

## 2019-05-12 ENCOUNTER — Encounter (HOSPITAL_COMMUNITY): Payer: Self-pay

## 2019-05-13 LAB — NOVEL CORONAVIRUS, NAA: SARS-CoV-2, NAA: NOT DETECTED

## 2019-05-17 ENCOUNTER — Encounter (HOSPITAL_COMMUNITY): Payer: Self-pay

## 2019-05-19 ENCOUNTER — Ambulatory Visit: Payer: Federal, State, Local not specified - PPO | Attending: Internal Medicine

## 2019-05-19 ENCOUNTER — Encounter (HOSPITAL_COMMUNITY): Payer: Self-pay

## 2019-05-19 DIAGNOSIS — Z20828 Contact with and (suspected) exposure to other viral communicable diseases: Secondary | ICD-10-CM | POA: Diagnosis not present

## 2019-05-19 DIAGNOSIS — Z20822 Contact with and (suspected) exposure to covid-19: Secondary | ICD-10-CM

## 2019-05-20 LAB — NOVEL CORONAVIRUS, NAA: SARS-CoV-2, NAA: DETECTED — AB

## 2019-05-21 ENCOUNTER — Other Ambulatory Visit: Payer: Self-pay | Admitting: Internal Medicine

## 2019-05-23 ENCOUNTER — Telehealth (HOSPITAL_COMMUNITY): Payer: Self-pay | Admitting: Family Medicine

## 2019-05-24 ENCOUNTER — Encounter (HOSPITAL_COMMUNITY): Payer: Self-pay

## 2019-05-26 ENCOUNTER — Other Ambulatory Visit: Payer: Self-pay | Admitting: Internal Medicine

## 2019-05-26 ENCOUNTER — Encounter (HOSPITAL_COMMUNITY): Payer: Self-pay

## 2019-05-26 DIAGNOSIS — E7849 Other hyperlipidemia: Secondary | ICD-10-CM

## 2019-05-26 DIAGNOSIS — R7989 Other specified abnormal findings of blood chemistry: Secondary | ICD-10-CM

## 2019-05-26 DIAGNOSIS — Z951 Presence of aortocoronary bypass graft: Secondary | ICD-10-CM

## 2019-05-26 DIAGNOSIS — E785 Hyperlipidemia, unspecified: Secondary | ICD-10-CM

## 2019-05-26 DIAGNOSIS — R05 Cough: Secondary | ICD-10-CM | POA: Diagnosis not present

## 2019-05-26 DIAGNOSIS — U071 COVID-19: Secondary | ICD-10-CM | POA: Diagnosis not present

## 2019-05-31 ENCOUNTER — Encounter (HOSPITAL_COMMUNITY): Payer: Self-pay

## 2019-06-02 ENCOUNTER — Encounter (HOSPITAL_COMMUNITY): Payer: Self-pay

## 2019-06-07 ENCOUNTER — Encounter (HOSPITAL_COMMUNITY): Payer: Self-pay

## 2019-06-09 ENCOUNTER — Encounter (HOSPITAL_COMMUNITY): Payer: Self-pay

## 2019-06-14 ENCOUNTER — Encounter (HOSPITAL_COMMUNITY): Payer: Self-pay

## 2019-06-16 ENCOUNTER — Encounter (HOSPITAL_COMMUNITY): Payer: Self-pay

## 2019-06-21 ENCOUNTER — Encounter (HOSPITAL_COMMUNITY): Payer: Self-pay

## 2019-06-23 ENCOUNTER — Encounter (HOSPITAL_COMMUNITY): Payer: Self-pay

## 2019-06-29 ENCOUNTER — Other Ambulatory Visit: Payer: Self-pay | Admitting: *Deleted

## 2019-06-29 ENCOUNTER — Other Ambulatory Visit: Payer: Self-pay

## 2019-06-29 DIAGNOSIS — E785 Hyperlipidemia, unspecified: Secondary | ICD-10-CM | POA: Diagnosis not present

## 2019-06-29 DIAGNOSIS — R7989 Other specified abnormal findings of blood chemistry: Secondary | ICD-10-CM

## 2019-06-29 DIAGNOSIS — Z5181 Encounter for therapeutic drug level monitoring: Secondary | ICD-10-CM

## 2019-06-29 DIAGNOSIS — E7849 Other hyperlipidemia: Secondary | ICD-10-CM

## 2019-06-29 DIAGNOSIS — Z951 Presence of aortocoronary bypass graft: Secondary | ICD-10-CM | POA: Diagnosis not present

## 2019-06-29 NOTE — Telephone Encounter (Signed)
I called patient and she confirms [facility] now has orders

## 2019-06-30 LAB — LIPID PANEL
Chol/HDL Ratio: 3.2 ratio (ref 0.0–4.4)
Cholesterol, Total: 122 mg/dL (ref 100–199)
HDL: 38 mg/dL — ABNORMAL LOW (ref >39)
LDL Chol Calc (NIH): 50 mg/dL (ref 0–99)
Triglycerides: 212 mg/dL — ABNORMAL HIGH (ref 0–149)
VLDL Cholesterol Cal: 34 mg/dL (ref 5–40)

## 2019-06-30 LAB — HEPATIC FUNCTION PANEL
ALT: 40 IU/L — ABNORMAL HIGH (ref 0–32)
AST: 33 IU/L (ref 0–40)
Albumin: 4.7 g/dL (ref 3.8–4.9)
Alkaline Phosphatase: 58 IU/L (ref 39–117)
Bilirubin Total: 0.4 mg/dL (ref 0.0–1.2)
Bilirubin, Direct: 0.13 mg/dL (ref 0.00–0.40)
Total Protein: 7.5 g/dL (ref 6.0–8.5)

## 2019-07-04 ENCOUNTER — Other Ambulatory Visit: Payer: Self-pay | Admitting: Internal Medicine

## 2019-07-04 ENCOUNTER — Other Ambulatory Visit: Payer: Self-pay

## 2019-07-04 ENCOUNTER — Encounter: Payer: Self-pay | Admitting: Internal Medicine

## 2019-07-04 ENCOUNTER — Ambulatory Visit: Payer: Federal, State, Local not specified - PPO | Admitting: Internal Medicine

## 2019-07-04 VITALS — BP 109/65 | HR 70 | Ht 60.0 in | Wt 142.0 lb

## 2019-07-04 DIAGNOSIS — E785 Hyperlipidemia, unspecified: Secondary | ICD-10-CM | POA: Diagnosis not present

## 2019-07-04 DIAGNOSIS — E7849 Other hyperlipidemia: Secondary | ICD-10-CM

## 2019-07-04 DIAGNOSIS — R748 Abnormal levels of other serum enzymes: Secondary | ICD-10-CM | POA: Diagnosis not present

## 2019-07-04 NOTE — Progress Notes (Signed)
OFFICE NOTE  Chief Complaint:  [name]  Primary Care Physician: Rachel Cruel, MD  HPI:  [name] is a 49 y.o. female with a past medial history significant for emergent single-vessel coronary artery bypass grafting with a 99% ostial LAD stenosis in February 2018.  She was not [name] to have any other significant obstructive coronary disease at the time.  Echo in May 2018 revealed an LVEF of 40-45% and an LAD wall motion abnormality.  He is a patient of Dr. Gwenlyn [name].  She has an extensive history of dyslipidemia and was told by her uncle when she was a child in [place] that her cholesterol was very high and that she would likely need to be on treatment.  She is adopted and does not know her birth parents, but does have a daughter who is 12 years old.  She has not had cholesterol testing to her knowledge.  She had been started on statins, but reports significant intolerance, this is to high-dose atorvastatin, rosuvastatin, and possibly others that she cannot recall.  She was then referred to start [name].  Her lipid profile as of July 2018 showed total cholesterol 246, triglycerides 237, HDL 35 and LDL 164.  After starting therapy on Repatha, she received 4 doses, approximately 2 months of therapy and her repeat lipid profile showed a total cholesterol 265, triglycerides 220, LDL 176 and HDL of 45.  This is suggesting that she has had no response to the PCS canine inhibitor.  She reports compliance with the medication.  She had no side effects from it.  Possible reasons for this include a PCSK9 gain of function mutation or LDL receptor mutation.  Findings are possibly suggestive of familial hyperlipidemia although her LDL is moderately elevated.  Initially the plan was to consider switching her to Praluent and she has been provided samples, however, it is likely that she will have a similar poor response.  10/30/2017  [name] returns [name] for [name] of dyslipidemia.  There is  clinical concern for familial hyperlipidemia and she does carry that diagnosis.  Unfortunately as previously mentioned she did not respond to PCSK9 inhibitor.  She did proceed with genetic testing after genetic counseling by Dr. Lattie [name]. 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  This finding is a pathogenic variant that does not allow the LDL receptor to fold properly and it is therefore poorly trafficked out of the golgi apparatus and rarely is expressed on the surface of the liver.  Based on this finding, it is understandable why a PCSK9 inhibitor which works by inhibiting the PCSK9 co-protein associated with surface LDL receptors would be ineffective as the total number of surface LDL receptors are extremely low.  We further discussed this mechanism and other strategies which may be helpful to lower her cholesterol.  She was again tried on pravastatin by the advanced heart failure clinic pharmacist, however had significant myalgias with this and may have some myopathy.  She says [name] that she is just now being able to straighten out the fingers on her hands.  At this point there are few additional options although she remains at high risk of recurrent events.  Her most recent cholesterol from several months ago showed total cholesterol 305, triglycerides 202, HDL 47 and LDL-C of 218.  Remaining options for her include LDL apheresis, for which she is likely a good candidate however is associated with significant cost and is cumbersome and would require her to likely  [name]  The other option would be lomitapide.  By mechanism of inhibiting MTP, lomitapide should be effective at lowering her overall lipid profile by 30 to 40%, but is associated with significant GI side effects and liver toxicity.  This will need to be monitored closely and requires prescription by a provider who has completed the REMS  program (which I am certified).  02/12/2018  [name] returns [name] for [name].  We have been working very diligently over the past several months to try to get her approval for lomitapide.  After multiple appeal she has finally been approved.  She indicates that she may be able to get this for no co-pay.  We will again need to repeat a lipid profile and liver enzymes as a baseline since is been 3 months since her last study and she would anticipate starting the medication next week.  We will need to monitor her liver enzymes very closely.  She is also made significant dietary changes at the direction of the nutritionist for [place] who is been working with her telephonically on the importance of changing her diet.  05/11/2018  [name] for [name].  She seems to be tolerating Juxtapid, but does have some issues with joint pain which is been a longstanding problem.  We are adhering to the rems program, and she is having monthly liver enzyme tests.  She has known steatohepatitis and has had elevated liver enzymes in the past.  I am pleased to report her lipids have improved.  Her total cholesterol came down from 379-274 with triglycerides down from 300-200 and LDL from 284 to 224.  Unfortunately her liver enzymes are climbing.  Over the past 6 months her ALT is climbed from 54 to 155 and AST from 34 to 65.  The ALT is now about 5 times upper limit normal.  I discussed this with her at this point and concerned that she may not be able to tolerate the medication.  Guidelines for the manufacturer suggest decreasing the dose however she is on the lowest dose.  I discussed this further with our pharmacist [name], Pharm.D., who reviewed the pharmacokinetics of the drug and given its longer half-life of close to 48 hours felt that the drug could be effectively dosed every other day and perhaps still maintain steady state while reducing the possibility of side effects.  In addition, [name] suffers  from arthritic pain and recently has had an upper respiratory infection which worsened her symptoms.  She has been taking Tylenol fairly regularly for this since she was once told not to take nonsteroidals due to her heart disease.  She did not realize that the Tylenol could worsen her liver enzyme abnormalities.  01/19/2019  [name] is seen [name] in [name].  Overall she seems to be doing quite well.  Although we had been maintaining her on Juxtapid, I was disappointed in the minimal reduction in her lipids.  This was at the trade off of elevated liver enzymes.  Initially there was some concern about coadministration of Tylenol which we discontinued and it did improve her numbers however the liver enzymes did not allow [place] to uptitrate the medication.  Her cholesterol really did not get a meaningful reduction.  Previously it was noted that she had had some benefit although not as much as expected with a PCSK9 inhibitor.  I recommended that we restart Repatha to see if that would be of benefit.  In addition with the recent introduction of Nexletol, I felt this could also add to lipid lowering.  It is well-tolerated and since she has had statin side effects I felt that would be appropriate for her.  I am pleased to report she has had a significant reduction in her lipid profile.  Her LDL cholesterol is reduced from 222 down to 26.  Total cholesterol has come down to 100 from 277.  HDL is increased from 30-38 and there has been a small increase in triglycerides from 126-181.  Some of this may be dietary and related to decreased exercise that she cannot participate in cardiac rehab at this time and they are moving to [name] therefore have been eating out more.  She had reported some cramping in her hands which is also resolved off of Juxtapid.  07/04/2019  [name].  Unfortunately she contracted Covid in late December.  She says she was sick for about 2 weeks.  She is also fallen off of  her diet and has not been doing exercise.  Cardiac rehabilitation was closed.  She recently moved and her new apartment complex has a gym.  She plans to get back to that.  This seems to track with an increase in her lipids.  She remains on Repatha.  She will need a renewal of her prescription.  When evaluating her triglycerides, they have increased steadily over the past year from 74-126 -181-212.  This also tracks a weight gain from 130 to 142 pounds.  Lipids recently were 122 total cholesterol, triglycerides 212, HDL 38 and LDL 50.  PMHx:  Past Medical History:  Diagnosis Date  . Anemia    due to heavy periods  . Anxiety   . Coronary artery disease   . Difficult intubation 01/22/2016  . Headache   . Heart murmur    aschild antibiotics if having Dental work  . Hypocholesteremia    medical treatment  . Ischemic cardiomyopathy   . Menorrhagia   . NSTEMI (non-ST elevated myocardial infarction) ([place])   . Pelvic pain     Past Surgical History:  Procedure Laterality Date  . ABDOMINAL HYSTERECTOMY    . CORONARY ARTERY BYPASS GRAFT N/A 07/12/2016   Procedure: CORONARY ARTERY BYPASS GRAFTING (CABG), ON PUMP, TIMES ONE, USING LEFT INTERNAL MAMMARY ARTERY WITH TEE;  Surgeon: Gaye Pollack, MD;  Location: [facility];  Service: Open Heart Surgery;  Laterality: N/A;  LIMA to LAD  . goiter     left side goiter removed  . INGUINAL HERNIA REPAIR Bilateral 01/22/2016   Procedure: LAPAROSCOPIC BILATERAL FEMORAL AND RIGHT INGUINAL HERNIA REPAIRWITH INSERTION OF MESH;  Surgeon: Michael Boston, MD;  Location: [facility];  Service: General;  Laterality: Bilateral;  . LEFT HEART CATH AND CORONARY ANGIOGRAPHY N/A 07/12/2016   Procedure: Left Heart Cath and Coronary Angiography;  Surgeon: Lorretta Harp, MD;  Location: [facility];  Service: Cardiovascular;  Laterality: N/A;  . SVD     x 1  . WISDOM TOOTH EXTRACTION      FAMHx:  Family History  Adopted: Yes    SOCHx:   reports that she has never  smoked. She has never used smokeless tobacco. She reports that she does not drink alcohol or use drugs.  ALLERGIES:  Allergies  Allergen Reactions  . Statins Other (See Comments)    Muscle aches with higher doses of: Crestor & Lipitor  . Bactrim Itching and Rash  ROS: Pertinent items noted in HPI and remainder of comprehensive ROS otherwise negative.  HOME MEDS: Current Outpatient Medications on File Prior to Visit  Medication Sig Dispense Refill  . aspirin 81 MG tablet Take 1 tablet (81 mg total) by mouth daily.    . Coenzyme Q10 (COQ10) 200 MG CAPS Take 200 mg by mouth at bedtime.     . Evolocumab (REPATHA SURECLICK) [id number] MG/ML SOAJ Inject 1 Dose into the skin every 14 (fourteen) days. 6 pen 3  . ezetimibe (ZETIA) 10 MG tablet TAKE 1 TABLET BY MOUTH EVERY DAY 90 tablet 0  . losartan (COZAAR) 25 MG tablet TAKE 1 TABLET BY MOUTH EVERY DAY 90 tablet 0  . metoprolol succinate (TOPROL XL) 25 MG 24 hr tablet Take 1 tablet (25 mg total) by mouth daily. 90 tablet 3  . NEXLETOL 180 MG TABS TAKE 1 TABLET BY MOUTH DAILY 30 tablet 4  . nitroGLYCERIN (NITROSTAT) 0.4 MG SL tablet Place 1 tablet (0.4 mg total) under the tongue every 5 (five) minutes x 3 doses as needed for chest pain. 30 tablet 6  . Omega-3 Fatty Acids (OMEGA-3 PO) Take 1 capsule by mouth daily with breakfast.    . spironolactone (ALDACTONE) 25 MG tablet TAKE 1 TABLET BY MOUTH DAILY 90 tablet 3  . VITAMIN D PO Take 1 tablet by mouth daily with breakfast.     No current facility-administered medications on file prior to visit.    LABS/IMAGING: No results [name] for this or any previous visit (from the past 48 hour(s)). No results [name].  LIPID PANEL:    Component Value Date/Time   CHOL 122 06/29/2019 1042   TRIG 212 (H) 06/29/2019 1042   HDL 38 (L) 06/29/2019 1042   CHOLHDL 3.2 06/29/2019 1042   CHOLHDL 7.8 09/02/2018 0449   VLDL 15 09/02/2018 0449   LDLCALC 50 06/29/2019 1042     WEIGHTS: Wt Readings from  Last 3 Encounters:  07/04/19 142 lb (64.4 kg)  03/04/19 134 lb (60.8 kg)  01/19/19 130 lb 12.8 oz (59.3 kg)    VITALS: BP 109/65   [name] 70   Ht 5' (1.524 m)   Wt 142 lb (64.4 kg)   LMP 08/10/2011   SpO2 96%   BMI 27.73 kg/m   EXAM: Deferred  EKG: Deferred  ASSESSMENT: 1. Coronary artery disease status post single-vessel CABG to the LAD 2. Ischemic cardiomyopathy, EF 40-45% 3. 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 Documented statin intolerance 5. Hepatic steatosis-elevated liver enzymes  PLAN: 1.   [name] remains well controlled with regards to LDL cholesterol around 50 however triglycerides have steadily increased in a trend associated with weight gain.  She is done less physical activity and her diet is worse.  In addition she had Covid and has been isolated.  She seems to be recovering and I have encouraged her to get back to exercise, make significant dietary changes and work to lower her triglycerides.  We will plan a repeat lipid profile and direct LDL in about 6 months.  If she is not at goal at that point, we may need to further adjust her medications.  Rachel Casino, MD, [facility], [place] Director of the Advanced Lipid Disorders &  Cardiovascular Risk Reduction Clinic Attending Cardiologist  Direct Dial: [phone]  Fax: [phone]  Website:  [email or web address] 07/04/2019, 9:27 AM

## 2019-07-04 NOTE — Patient Instructions (Signed)
Medication Instructions:  Your physician recommends that you continue on your current medications as directed. Please refer to the Current Medication list given to you today.  If you need a refill on your cardiac medications before your next appointment, please call your pharmacy.   Lab work: Repeat Lipids in 6 months If you have labs (blood work) drawn today and your tests are completely normal, you will receive your results only by: [name] (if you have MyChart) OR A paper copy in the mail If you have any lab test that is abnormal or we need to change your treatment, we will call you to review the results.  Testing/Procedures: NONE  Follow-Up: At [facility], you and your health needs are our priority.  As part of our continuing mission to provide you with exceptional heart care, we have created designated Provider Care Teams.  These Care Teams include your primary Cardiologist (physician) and Advanced Practice Providers (APPs -  Physician Assistants and Nurse Practitioners) who all work together to provide you with the care you need, when you need it. You may see Dr. [name] or one of the following Advanced Practice Providers on your designated Care Team:    [name], PA-C  [name], [place] or   [name], [place]  Your physician wants you to follow-up in: 6 months with Dr. [name]

## 2019-07-05 ENCOUNTER — Other Ambulatory Visit: Payer: Self-pay | Admitting: Internal Medicine

## 2019-08-23 ENCOUNTER — Telehealth: Payer: Self-pay | Admitting: Internal Medicine

## 2019-08-23 NOTE — Telephone Encounter (Signed)
PA for nexletol submitted via CMM Key: [id number] - PA Case ID: [id number] - Rx #: [id number]

## 2019-08-24 NOTE — Telephone Encounter (Signed)
[employer] FEP has denied coverage of nexletol - "the use of this medication in combination with a PCSK9i or Juxtapid does not establish medical necessity for this drug"  In order to file an appeal, patient will need to write a letter of request for reconsideration within 6 months.   Appeal request can be mailed to Service Benefit Plan      [address] [address]      [place], [place] [address]      Attn: Prior Approval Clinical Services Or faxed to: [phone]  Letter composed in Epic Patient notified of request for letter via [place]

## 2019-08-30 NOTE — Telephone Encounter (Signed)
Faxed to [facility] FEP @ [phone] request for records - last lipid panel, MD notes over 12 months w/ MD medical necessity letter.

## 2019-09-09 NOTE — Telephone Encounter (Addendum)
Nexletol appeal has been approved per [employer] FEP as of this morning.  August 10, 2019 - September 08, 2020

## 2019-09-13 ENCOUNTER — Other Ambulatory Visit: Payer: Self-pay | Admitting: Internal Medicine

## 2019-09-14 ENCOUNTER — Telehealth: Payer: Self-pay | Admitting: Internal Medicine

## 2019-09-14 NOTE — Telephone Encounter (Signed)
Message sent to patient with provider response

## 2019-09-14 NOTE — Telephone Encounter (Signed)
Would advise taking the replacement dose when it arrives and starting a new schedule every 2 weeks.  Dr Lemmie Evens

## 2019-09-14 NOTE — Telephone Encounter (Signed)
Verified one time replacement of damaged Repatha [employer] with pharmacy

## 2019-09-14 NOTE — Telephone Encounter (Signed)
Sharyn Lull from [employer] stating the patient received a damaged repatha and is requesting a verbal order for a new one.

## 2019-09-14 NOTE — Telephone Encounter (Signed)
PA for repatha submitted via CMM (KeyMal [place]) [id number]

## 2019-09-15 NOTE — Telephone Encounter (Signed)
Rx(s) sent to pharmacy electronically.  

## 2019-09-16 NOTE — Telephone Encounter (Signed)
PA for Repatha [employer] approved from [id number] - 09/15/2020 per [employer] FEP

## 2019-09-23 ENCOUNTER — Other Ambulatory Visit: Payer: Self-pay | Admitting: Cardiovascular Disease

## 2019-09-30 ENCOUNTER — Ambulatory Visit: Payer: Federal, State, Local not specified - PPO | Attending: Internal Medicine

## 2019-09-30 DIAGNOSIS — Z23 Encounter for immunization: Secondary | ICD-10-CM

## 2019-09-30 NOTE — Progress Notes (Signed)
   Covid-19 Vaccination Clinic  Name:  [name]    MRN: [id number] DOB: 1971-05-09  09/30/2019  [name] was observed post Covid-19 immunization for 15 minutes without incident. She was provided with Vaccine Information Sheet and instruction to access the V-Safe system.   [name] was instructed to call 911 with any severe reactions post vaccine: Marland Kitchen Difficulty breathing  . Swelling of face and throat  . A fast heartbeat  . A bad rash all over body  . Dizziness and weakness   Immunizations Administered    Name Date Dose VIS Date Route   Pfizer COVID-19 Vaccine 09/30/2019 10:33 AM 0.3 mL 07/20/2018 Intramuscular   Manufacturer: [employer], [employer]   Lot: [id number]   [place]: [id number]

## 2019-10-01 ENCOUNTER — Ambulatory Visit: Payer: Federal, State, Local not specified - PPO

## 2019-10-09 ENCOUNTER — Other Ambulatory Visit: Payer: Self-pay | Admitting: Internal Medicine

## 2019-10-25 ENCOUNTER — Ambulatory Visit: Payer: Federal, State, Local not specified - PPO

## 2019-11-01 ENCOUNTER — Ambulatory Visit: Payer: Federal, State, Local not specified - PPO | Attending: Internal Medicine

## 2019-11-01 DIAGNOSIS — Z23 Encounter for immunization: Secondary | ICD-10-CM

## 2019-11-01 NOTE — Progress Notes (Signed)
   Covid-19 Vaccination Clinic  Name:  [name]    MRN: [id number] DOB: 30-Mar-1971  11/01/2019  [name] was observed post Covid-19 immunization for 15 minutes without incident. She was provided with Vaccine Information Sheet and instruction to access the V-Safe system.   [name] was instructed to call 911 with any severe reactions post vaccine: Marland Kitchen Difficulty breathing  . Swelling of face and throat  . A fast heartbeat  . A bad rash all over body  . Dizziness and weakness   Immunizations Administered    Name Date Dose VIS Date Route   Pfizer COVID-19 Vaccine 11/01/2019 12:04 PM 0.3 mL 07/20/2018 Intramuscular   Manufacturer: [place]   Lot: [id number]   [place]: [id number]

## 2020-01-06 ENCOUNTER — Other Ambulatory Visit: Payer: Self-pay | Admitting: *Deleted

## 2020-01-06 DIAGNOSIS — E7849 Other hyperlipidemia: Secondary | ICD-10-CM

## 2020-01-06 DIAGNOSIS — E785 Hyperlipidemia, unspecified: Secondary | ICD-10-CM

## 2020-01-10 DIAGNOSIS — E7849 Other hyperlipidemia: Secondary | ICD-10-CM | POA: Diagnosis not present

## 2020-01-10 DIAGNOSIS — E785 Hyperlipidemia, unspecified: Secondary | ICD-10-CM | POA: Diagnosis not present

## 2020-01-12 ENCOUNTER — Encounter: Payer: Self-pay | Admitting: Internal Medicine

## 2020-01-12 ENCOUNTER — Ambulatory Visit: Payer: Federal, State, Local not specified - PPO | Admitting: Internal Medicine

## 2020-01-12 ENCOUNTER — Other Ambulatory Visit: Payer: Self-pay

## 2020-01-12 VITALS — BP 118/72 | HR 64 | Ht 60.0 in | Wt 140.0 lb

## 2020-01-12 DIAGNOSIS — E7849 Other hyperlipidemia: Secondary | ICD-10-CM | POA: Diagnosis not present

## 2020-01-12 DIAGNOSIS — I255 Ischemic cardiomyopathy: Secondary | ICD-10-CM | POA: Diagnosis not present

## 2020-01-12 DIAGNOSIS — Z951 Presence of aortocoronary bypass graft: Secondary | ICD-10-CM

## 2020-01-12 LAB — HEPATIC FUNCTION PANEL
ALT: 34 IU/L — ABNORMAL HIGH (ref 0–32)
AST: 29 IU/L (ref 0–40)
Albumin: 4.7 g/dL (ref 3.8–4.9)
Alkaline Phosphatase: 61 IU/L (ref 48–121)
Bilirubin Total: 0.3 mg/dL (ref 0.0–1.2)
Bilirubin, Direct: 0.12 mg/dL (ref 0.00–0.40)
Total Protein: 7.4 g/dL (ref 6.0–8.5)

## 2020-01-12 LAB — LIPID PANEL
Chol/HDL Ratio: 3.7 ratio (ref 0.0–4.4)
Cholesterol, Total: 129 mg/dL (ref 100–199)
HDL: 35 mg/dL — ABNORMAL LOW (ref >39)
LDL Chol Calc (NIH): 58 mg/dL (ref 0–99)
Triglycerides: 222 mg/dL — ABNORMAL HIGH (ref 0–149)
VLDL Cholesterol Cal: 36 mg/dL (ref 5–40)

## 2020-01-12 NOTE — Progress Notes (Signed)
OFFICE NOTE  Chief Complaint:  Follow-up dyslipidemia  Primary Care Physician: Lawerance Cruel, MD  HPI:  [name] is a 49 y.o. female with a past medial history significant for emergent single-vessel coronary artery bypass grafting with a 99% ostial LAD stenosis in February 2018.  She was not found to have any other significant obstructive coronary disease at the time.  Echo in May 2018 revealed an LVEF of 40-45% and an LAD wall motion abnormality.  He is a patient of Dr. [name].  She has an extensive history of dyslipidemia and was told by her uncle when she was a child in [place] that her cholesterol was very high and that she would likely need to be on treatment.  She is adopted and does not know her birth parents, but does have a daughter who is 12 years old.  She has not had cholesterol testing to her knowledge.  She had been started on statins, but reports significant intolerance, this is to high-dose atorvastatin, rosuvastatin, and possibly others that she cannot recall.  She was then referred to start [place].  Her lipid profile as of July 2018 showed total cholesterol 246, triglycerides 237, HDL 35 and LDL 164.  After starting therapy on Repatha, she received 4 doses, approximately 2 months of therapy and her repeat lipid profile showed a total cholesterol 265, triglycerides 220, LDL 176 and HDL of 45.  This is suggesting that she has had no response to the PCS canine inhibitor.  She reports compliance with the medication.  She had no side effects from it.  Possible reasons for this include a PCSK9 gain of function mutation or LDL receptor mutation.  Findings are possibly suggestive of familial hyperlipidemia although her LDL is moderately elevated.  Initially the plan was to consider switching her to Praluent and she has been provided samples, however, it is likely that she will have a similar poor response.  10/30/2017  [name] returns today for follow-up of dyslipidemia.  There is  clinical concern for familial hyperlipidemia and she does carry that diagnosis.  Unfortunately as previously mentioned she did not respond to PCSK9 inhibitor.  She did proceed with genetic testing after genetic counseling by Dr. Lattie Corns. 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  This finding is a pathogenic variant that does not allow the LDL receptor to fold properly and it is therefore poorly trafficked out of the golgi apparatus and rarely is expressed on the surface of the liver.  Based on this finding, it is understandable why a PCSK9 inhibitor which works by inhibiting the PCSK9 co-protein associated with surface LDL receptors would be ineffective as the total number of surface LDL receptors are extremely low.  We further discussed this mechanism and other strategies which may be helpful to lower her cholesterol.  She was again tried on pravastatin by the advanced heart failure clinic pharmacist, however had significant myalgias with this and may have some myopathy.  She says today that she is just now being able to straighten out the fingers on her hands.  At this point there are few additional options although she remains at high risk of recurrent events.  Her most recent cholesterol from several months ago showed total cholesterol 305, triglycerides 202, HDL 47 and LDL-C of 218.  Remaining options for her include LDL apheresis, for which she is likely a good candidate however is associated with significant cost and is cumbersome and would require her to likely  travel several times a month to either [facility] or [place].  The other option would be lomitapide.  By mechanism of inhibiting MTP, lomitapide should be effective at lowering her overall lipid profile by 30 to 40%, but is associated with significant GI side effects and liver toxicity.  This will need to be monitored closely and requires prescription by a provider who has completed the REMS  program (which I am certified).  02/12/2018  Tarry returns today for follow-up.  We have been working very diligently over the past several months to try to get her approval for lomitapide.  After multiple appeal she has finally been approved.  She indicates that she may be able to get this for no co-pay.  We will again need to repeat a lipid profile and liver enzymes as a baseline since is been 3 months since her last study and she would anticipate starting the medication next week.  We will need to monitor her liver enzymes very closely.  She is also made significant dietary changes at the direction of the nutritionist for [place] who is been working with her telephonically on the importance of changing her diet.  05/11/2018  [name] returns today for follow-up.  She seems to be tolerating Juxtapid, but does have some issues with joint pain which is been a longstanding problem.  We are adhering to the rems program, and she is having monthly liver enzyme tests.  She has known steatohepatitis and has had elevated liver enzymes in the past.  I am pleased to report her lipids have improved.  Her total cholesterol came down from 379-274 with triglycerides down from 300-200 and LDL from 284 to 224.  Unfortunately her liver enzymes are climbing.  Over the past 6 months her ALT is climbed from 54 to 155 and AST from 34 to 65.  The ALT is now about 5 times upper limit normal.  I discussed this with her at this point and concerned that she may not be able to tolerate the medication.  Guidelines for the manufacturer suggest decreasing the dose however she is on the lowest dose.  I discussed this further with our pharmacist Tana Coast, Pharm.D., who reviewed the pharmacokinetics of the drug and given its longer half-life of close to 48 hours felt that the drug could be effectively dosed every other day and perhaps still maintain steady state while reducing the possibility of side effects.  In addition, [name] suffers  from arthritic pain and recently has had an upper respiratory infection which worsened her symptoms.  She has been taking Tylenol fairly regularly for this since she was once told not to take nonsteroidals due to her heart disease.  She did not realize that the Tylenol could worsen her liver enzyme abnormalities.  01/19/2019  Alexandr is seen today in follow-up.  Overall she seems to be doing quite well.  Although we had been maintaining her on Juxtapid, I was disappointed in the minimal reduction in her lipids.  This was at the trade off of elevated liver enzymes.  Initially there was some concern about coadministration of Tylenol which we discontinued and it did improve her numbers however the liver enzymes did not allow [place] to uptitrate the medication.  Her cholesterol really did not get a meaningful reduction.  Previously it was noted that she had had some benefit although not as much as expected with a PCSK9 inhibitor.  I recommended that we restart Repatha to see if that would be of benefit.  In addition with the recent introduction of Nexletol, I felt this could also add to lipid lowering.  It is well-tolerated and since she has had statin side effects I felt that would be appropriate for her.  I am pleased to report she has had a significant reduction in her lipid profile.  Her LDL cholesterol is reduced from 222 down to 26.  Total cholesterol has come down to 100 from 277.  HDL is increased from 30-38 and there has been a small increase in triglycerides from 126-181.  Some of this may be dietary and related to decreased exercise that she cannot participate in cardiac rehab at this time and they are moving to [facility] therefore have been eating out more.  She had reported some cramping in her hands which is also resolved off of Juxtapid.  07/04/2019  Deshon is seen today in follow-up.  Unfortunately she contracted Covid in late December.  She says she was sick for about 2 weeks.  She is also fallen off  of her diet and has not been doing exercise.  Cardiac rehabilitation was closed.  She recently moved and her new apartment complex has a gym.  She plans to get back to that.  This seems to track with an increase in her lipids.  She remains on Repatha.  She will need a renewal of her prescription.  When evaluating her triglycerides, they have increased steadily over the past year from 74-126 -181-212.  This also tracks a weight gain from 130 to 142 pounds.  Lipids recently were 122 total cholesterol, triglycerides 212, HDL 38 and LDL 50.  01/12/2020  Shavonte is seen today for follow-up.  Overall her lipids have been fairly stable.  Cholesterol most recently was 129, triglycerides 222, HDL 35 and LDL 58.  She is tolerating triple therapy on Repatha, Nexletol and ezetimibe.  Liver enzymes have been normal.  She reports some unusual left upper quadrant pain which she thought initially was her liver however I remind her the liver was on the right upper quadrant.  In addition she is describing some upper neck and lower occipital pain.  She wonders if this might be related to restarting her exercise.  These episodes may last for couple hours.  She has not tried nitroglycerin for it and did not feel that it was her heart although it had some similar features to prior to her previous cardiac episodes.  PMHx:  Past Medical History:  Diagnosis Date  . Anemia    due to heavy periods  . Anxiety   . Coronary artery disease   . Difficult intubation 01/22/2016  . Headache   . Heart murmur    aschild antibiotics if having Dental work  . Hypocholesteremia    medical treatment  . Ischemic cardiomyopathy   . Menorrhagia   . NSTEMI (non-ST elevated myocardial infarction) ([place])   . Pelvic pain     Past Surgical History:  Procedure Laterality Date  . ABDOMINAL HYSTERECTOMY    . CORONARY ARTERY BYPASS GRAFT N/A 07/12/2016   Procedure: CORONARY ARTERY BYPASS GRAFTING (CABG), ON PUMP, TIMES ONE, USING LEFT INTERNAL  MAMMARY ARTERY WITH TEE;  Surgeon: Gaye Pollack, MD;  Location: [facility];  Service: Open Heart Surgery;  Laterality: N/A;  LIMA to LAD  . goiter     left side goiter removed  . INGUINAL HERNIA REPAIR Bilateral 01/22/2016   Procedure: LAPAROSCOPIC BILATERAL FEMORAL AND RIGHT INGUINAL HERNIA REPAIRWITH INSERTION OF MESH;  Surgeon: Michael Boston, MD;  Location: [facility];  Service: General;  Laterality: Bilateral;  . LEFT HEART CATH AND CORONARY ANGIOGRAPHY N/A 07/12/2016   Procedure: Left Heart Cath and Coronary Angiography;  Surgeon: Lorretta Harp, MD;  Location: [facility];  Service: Cardiovascular;  Laterality: N/A;  . SVD     x 1  . WISDOM TOOTH EXTRACTION      FAMHx:  Family History  Adopted: Yes    SOCHx:   reports that she has never smoked. She has never used smokeless tobacco. She reports that she does not drink alcohol and does not use drugs.  ALLERGIES:  Allergies  Allergen Reactions  . Statins Other (See Comments)    Muscle aches with higher doses of: Crestor & Lipitor  . Bactrim Itching and Rash         ROS: Pertinent items noted in HPI and remainder of comprehensive ROS otherwise negative.  HOME MEDS: Current Outpatient Medications on File Prior to Visit  Medication Sig Dispense Refill  . aspirin 81 MG tablet Take 1 tablet (81 mg total) by mouth daily.    . Coenzyme Q10 (COQ10) 200 MG CAPS Take 200 mg by mouth at bedtime.     Marland Kitchen ezetimibe (ZETIA) 10 MG tablet TAKE 1 TABLET BY MOUTH EVERY DAY 90 tablet 3  . losartan (COZAAR) 25 MG tablet TAKE 1 TABLET BY MOUTH EVERY DAY 90 tablet 3  . metoprolol succinate (TOPROL-XL) 25 MG 24 hr tablet Take 1 tablet (25 mg total) by mouth daily. 90 tablet 2  . NEXLETOL 180 MG TABS TAKE 1 TABLET BY MOUTH EVERY DAY 30 tablet 3  . nitroGLYCERIN (NITROSTAT) 0.4 MG SL tablet Place 1 tablet (0.4 mg total) under the tongue every 5 (five) minutes x 3 doses as needed for chest pain. 30 tablet 6  . Omega-3 Fatty Acids (OMEGA-3 PO) Take 1  capsule by mouth daily with breakfast.    . REPATHA SURECLICK 562 MG/ML SOAJ INJECT 1 PEN SUBCUTANEOUSLY EVERY 14 DAYS 2 pen 11  . spironolactone (ALDACTONE) 25 MG tablet TAKE 1 TABLET BY MOUTH DAILY 90 tablet 3  . VITAMIN D PO Take 1 tablet by mouth daily with breakfast.     No current facility-administered medications on file prior to visit.    LABS/IMAGING: No results found for this or any previous visit (from the past 48 hour(s)). No results found.  LIPID PANEL:    Component Value Date/Time   CHOL 129 01/10/2020 0821   TRIG 222 (H) 01/10/2020 0821   HDL 35 (L) 01/10/2020 0821   CHOLHDL 3.7 01/10/2020 0821   CHOLHDL 7.8 09/02/2018 0449   VLDL 15 09/02/2018 0449   LDLCALC 58 01/10/2020 0821     WEIGHTS: Wt Readings from Last 3 Encounters:  01/12/20 140 lb (63.5 kg)  07/04/19 142 lb (64.4 kg)  03/04/19 134 lb (60.8 kg)    VITALS: BP 118/72   Pulse 64   Ht 5' (1.524 m)   Wt 140 lb (63.5 kg)   LMP 08/10/2011   BMI 27.34 kg/m   EXAM: Deferred  EKG: Deferred  ASSESSMENT: 1. Coronary artery disease status post single-vessel CABG to the LAD 2. Ischemic cardiomyopathy, EF 40-45% 3. 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 Documented statin intolerance 5. Hepatic steatosis-elevated liver enzymes  PLAN: 1.   Mrs. Narvaez seems to have fairly stable lipids now improved significantly on her current regimen.  No significant side effects.  We will still need to continue to work  on triglycerides which is primarily dietary related.  She is exercising more regularly which should help.  I do not think it sounds like she is having angina however I advised her to monitor it further.  If she has recurrent symptoms she can consider diagnostic nitroglycerin.  She needs to make a follow-up appointment with her primary cardiologist Dr. [name].  Follow-up with me annually or sooner as necessary.  Pixie Casino, MD, [facility], [place] Director of the Advanced Lipid Disorders &  Cardiovascular Risk Reduction Clinic Attending Cardiologist  Direct Dial: [phone]  Fax: [phone]  Website:  [email or web address] 01/12/2020, 12:38 PM

## 2020-01-12 NOTE — Patient Instructions (Addendum)
Medication Instructions:  Your physician recommends that you continue on your current medications as directed. Please refer to the Current Medication list given to you today.  *If you need a refill on your cardiac medications before your next appointment, please call your pharmacy*   Lab Work: FASTING lipid panel in 12 months to check cholesterol   If you have labs (blood work) drawn today and your tests are completely normal, you will receive your results only by: Marland Kitchen MyChart Message (if you have MyChart) OR . A paper copy in the mail If you have any lab test that is abnormal or we need to change your treatment, we will call you to review the results.   Testing/Procedures: NONE   Follow-Up: At [facility], you and your health needs are our priority.  As part of our continuing mission to provide you with exceptional heart care, we have created designated Provider Care Teams.  These Care Teams include your primary Cardiologist (physician) and Advanced Practice Providers (APPs -  Physician Assistants and Nurse Practitioners) who all work together to provide you with the care you need, when you need it.  We recommend signing up for the patient portal called "MyChart".  Sign up information is provided on this After Visit Summary.  MyChart is used to connect with patients for Virtual Visits (Telemedicine).  Patients are able to view lab/test results, encounter notes, upcoming appointments, etc.  Non-urgent messages can be sent to your provider as well.   To learn more about what you can do with MyChart, go to [email or web address].    Your next appointment:   12 month(s) - lipid clinic  The format for your next appointment:   In Person  Provider:   K. Mali Hilty, MD   Other Instructions  Dr. [name] advises that you schedule an appointment with Dr. [name] soon

## 2020-02-14 ENCOUNTER — Encounter: Payer: Self-pay | Admitting: Cardiovascular Disease

## 2020-02-14 ENCOUNTER — Other Ambulatory Visit: Payer: Self-pay

## 2020-02-14 ENCOUNTER — Other Ambulatory Visit: Payer: Self-pay | Admitting: Cardiovascular Disease

## 2020-02-14 ENCOUNTER — Ambulatory Visit: Payer: Federal, State, Local not specified - PPO | Admitting: Cardiovascular Disease

## 2020-02-14 DIAGNOSIS — Z951 Presence of aortocoronary bypass graft: Secondary | ICD-10-CM

## 2020-02-14 DIAGNOSIS — I10 Essential (primary) hypertension: Secondary | ICD-10-CM

## 2020-02-14 DIAGNOSIS — R072 Precordial pain: Secondary | ICD-10-CM | POA: Diagnosis not present

## 2020-02-14 DIAGNOSIS — E7849 Other hyperlipidemia: Secondary | ICD-10-CM

## 2020-02-14 MED ORDER — ISOSORBIDE MONONITRATE ER 30 MG PO TB24
15.0000 mg | ORAL_TABLET | Freq: Every day | ORAL | 0 refills | Status: DC
Start: 2020-02-14 — End: 2020-02-14

## 2020-02-14 MED ORDER — NITROGLYCERIN 0.4 MG SL SUBL: 0.4000 mg | SUBLINGUAL_TABLET | SUBLINGUAL | 6 refills | Status: DC | PRN

## 2020-02-14 NOTE — Assessment & Plan Note (Signed)
History of familial hyperlipidemia followed by Dr. [name] in the lipid clinic.  Her medications include Repatha, Nexletol and Zetia with a fasting lipid profile performed 01/10/2020 revealing total cholesterol 129, LDL 58 and HDL 35.

## 2020-02-14 NOTE — Assessment & Plan Note (Signed)
History of CAD status post non-STEMI 2/ 17/2018 revealing a 99% ostial/proximal LAD.  She underwent emergency one-vessel CABG by Dr. Cyndia Bent with a LIMA to her LAD.  She did have an anteroapical wall motion normality.  She recuperated nicely although she continued to have moderate LV dysfunction.  Echo performed 08/31/2009 EF of 35 to 40% with an apical aneurysm, septal anterior wall hypokinesia.  She has been experiencing some shortness of breath and some back pain similar to her pain when she had her infarct.  I am going to put her on low-dose long-acting nitroglycerin, obtain a 2D echo and a Lexiscan Myoview and see her back for close outpatient follow-up.

## 2020-02-14 NOTE — Progress Notes (Signed)
02/14/2020 [name]   [birth year]  [id number]  Primary Physician Lawerance Cruel, MD Primary Cardiologist: Lorretta Harp MD [name], [place]  HPI:  [name] is a 49 y.o.  mildly overweight married Caucasian female mother of one 51 year old daughter. I last saw her  in the office 03/04/2019.Marland KitchenShe underwentcoronary artery bypass grafting times one emergently by Dr. Cyndia Bent on 07/12/16. The remainder of her coronary arteries were unremarkable. She did have a non-STEMI on that day with progressively rising troponins and EKG changes. She had a 99% ostial LAD with anteroapical wall motion abnormality. She is recuperating nicely. She is about to start cardiac rehabilitation.She denies chest pain or shortness of breath. She does complain of some cramping in her legs which may be statin-related. Her last echo performed 10/21/16 revealed ejection fraction of 40-45% with wall motion and a all motion abnormality in the LAD distribution. She was begun on carvedilol which apparently she did not tolerate. She was alsoplaced on Repathawhich did not affect her lipid profileas well as Praluent.She was referred to Dr. Reubin Milan for genetic testing for presumed FH. Repeat 2-D echo performed 05/06/17 revealed an EF of 40-45% with anteroapical hypokinesia.  I did refer her to Dr. [name] for advanced lipid evaluation therapy. 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 She was unresponsive to statin therapy and PCSK9. She is now being tried on Juxtapid. I also sent her to Dr. [name] in the advanced heart failure clinic. She is on optimal medical therapy for this. She is now NYHA class I-2 is relatively asymptomatic with an EF of 45 to 50%.   She was admitted 09/01/2018 for 1 day. She ruled out for myocardial infarction. She had no acute EKG changes. A 2D echo was performed that showed an EF in the 35 to 40% range with apical aneurysm and inferior hypokinesia. She was seen by Dr. Harrington Challenger  and sent home for noncardiac chest pain. Since being at home she is been asymptomatic. She was taken off her beta-blocker because of bradycardia on telemetry although now she complains of tachycardia.   I did place her back on a beta-blocker  Since I saw her a year ago she has been seeing Dr. [name] for lipid management with incredible results.  She is complained of some fatigue, dyspnea on exertion since she is been going back to the gym and some back pain similar to her infarct pain.   Current Meds  Medication Sig  . aspirin 81 MG tablet Take 1 tablet (81 mg total) by mouth daily.  . Coenzyme Q10 (COQ10) 200 MG CAPS Take 200 mg by mouth at bedtime.   Marland Kitchen ezetimibe (ZETIA) 10 MG tablet TAKE 1 TABLET BY MOUTH EVERY DAY  . losartan (COZAAR) 25 MG tablet TAKE 1 TABLET BY MOUTH EVERY DAY  . metoprolol succinate (TOPROL-XL) 25 MG 24 hr tablet Take 1 tablet (25 mg total) by mouth daily.  Marland Kitchen NEXLETOL 180 MG TABS TAKE 1 TABLET BY MOUTH EVERY DAY  . nitroGLYCERIN (NITROSTAT) 0.4 MG SL tablet Place 1 tablet (0.4 mg total) under the tongue every 5 (five) minutes x 3 doses as needed for chest pain.  . Omega-3 Fatty Acids (OMEGA-3 PO) Take 1 capsule by mouth daily with breakfast.  . REPATHA SURECLICK 132 MG/ML SOAJ INJECT 1 PEN SUBCUTANEOUSLY EVERY 14 DAYS  . spironolactone (ALDACTONE) 25 MG tablet TAKE 1 TABLET BY MOUTH DAILY  . VITAMIN D PO Take 1 tablet by mouth daily with breakfast.  Allergies  Allergen Reactions  . Statins Other (See Comments)    Muscle aches with higher doses of: Crestor & Lipitor  . Bactrim Itching and Rash         Social History   Socioeconomic History  . Marital status: Married    Spouse name: Not on file  . Number of children: Not on file  . Years of education: Not on file  . Highest education level: Not on file  Occupational History  . Not on file  Tobacco Use  . Smoking status: Never Smoker  . Smokeless tobacco: Never Used  Substance and Sexual Activity   . Alcohol use: No  . Drug use: No  . Sexual activity: Yes    Birth control/protection: None  Other Topics Concern  . Not on file  Social History Narrative  . Not on file   Social Determinants of Health   Financial Resource Strain:   . Difficulty of Paying Living Expenses: Not on file  Food Insecurity:   . Worried About Charity fundraiser in the Last Year: Not on file  . Ran Out of Food in the Last Year: Not on file  Transportation Needs:   . Lack of Transportation (Medical): Not on file  . Lack of Transportation (Non-Medical): Not on file  Physical Activity:   . Days of Exercise per Week: Not on file  . Minutes of Exercise per Session: Not on file  Stress:   . Feeling of Stress : Not on file  Social Connections:   . Frequency of Communication with Friends and Family: Not on file  . Frequency of Social Gatherings with Friends and Family: Not on file  . Attends Religious Services: Not on file  . Active Member of Clubs or Organizations: Not on file  . Attends Archivist Meetings: Not on file  . Marital Status: Not on file  Intimate Partner Violence:   . Fear of Current or Ex-Partner: Not on file  . Emotionally Abused: Not on file  . Physically Abused: Not on file  . Sexually Abused: Not on file     Review of Systems: General: negative for chills, fever, night sweats or weight changes.  Cardiovascular: negative for chest pain, dyspnea on exertion, edema, orthopnea, palpitations, paroxysmal nocturnal dyspnea or shortness of breath Dermatological: negative for rash Respiratory: negative for cough or wheezing Urologic: negative for hematuria Abdominal: negative for nausea, vomiting, diarrhea, bright red blood per rectum, melena, or hematemesis Neurologic: negative for visual changes, syncope, or dizziness All other systems reviewed and are otherwise negative except as noted above.    Blood pressure 119/78, pulse 69, height 5' (1.524 m), weight 140 lb (63.5 kg),  last menstrual period 08/10/2011, SpO2 99 %.  General appearance: alert and no distress Neck: no adenopathy, no carotid bruit, no JVD, supple, symmetrical, trachea midline and thyroid not enlarged, symmetric, no tenderness/mass/nodules Lungs: clear to auscultation bilaterally Heart: Soft outflow tract murmur consistent with aortic sclerosis and/or stenosis Extremities: extremities normal, atraumatic, no cyanosis or edema Pulses: 2+ and symmetric Skin: Skin color, texture, turgor normal. No rashes or lesions Neurologic: Alert and oriented X 3, normal strength and tone. Normal symmetric reflexes. Normal coordination and gait  EKG sinus rhythm at 69 with low limb voltage and septal Q waves consistent with old anterior wall myocardial infarction.  I personally reviewed this EKG.  ASSESSMENT AND PLAN:   S/P CABG x 1 History of CAD status post non-STEMI 2/ 17/2018 revealing a 99% ostial/proximal LAD.  She underwent emergency one-vessel CABG by Dr. Cyndia Bent with a LIMA to her LAD.  She did have an anteroapical wall motion normality.  She recuperated nicely although she continued to have moderate LV dysfunction.  Echo performed 08/31/2009 EF of 35 to 40% with an apical aneurysm, septal anterior wall hypokinesia.  She has been experiencing some shortness of breath and some back pain similar to her pain when she had her infarct.  I am going to put her on low-dose long-acting nitroglycerin, obtain a 2D echo and a Lexiscan Myoview and see her back for close outpatient follow-up.  Familial hyperlipidemia History of familial hyperlipidemia followed by Dr. [name] in the lipid clinic.  Her medications include Repatha, Nexletol and Zetia with a fasting lipid profile performed 01/10/2020 revealing total cholesterol 129, LDL 58 and HDL 35.  Hypertension History of essential hypertension blood pressure measured today at 118/78.  She is on losartan ,metoprolol, and spironolactone.      Lorretta Harp MD  FACP,FACC,FAHA, [facility] 02/14/2020 3:25 PM

## 2020-02-14 NOTE — Assessment & Plan Note (Signed)
History of essential hypertension blood pressure measured today at 118/78.  She is on losartan ,metoprolol, and spironolactone.

## 2020-02-14 NOTE — Patient Instructions (Addendum)
Medication Instructions:  PLEASE START IMDUR (15mg ) half tablet once daily  *If you need a refill on your cardiac medications before your next appointment, please call your pharmacy*  Lab Work: None Ordered At This Time.  If you have labs (blood work) drawn today and your tests are completely normal, you will receive your results only by:  [place] (if you have MyChart) OR  A paper copy in the mail If you have any lab test that is abnormal or we need to change your treatment, we will call you to review the results.  Testing/Procedures: Your physician has requested that you have a lexiscan myoview. For further information please visit HugeFiesta.[email or web address]. Please follow instruction sheet, as given.  Your physician has requested that you have an echocardiogram. Echocardiography is a painless test that uses sound waves to create images of your heart. It provides your doctor with information about the size and shape of your heart and how well your hearts chambers and valves are working. You may receive an ultrasound enhancing agent through an IV if needed to better visualize your heart during the echo.This procedure takes approximately one hour. There are no restrictions for this procedure. This will take place at the [address].   Follow-Up: At [facility], you and your health needs are our priority.  As part of our continuing mission to provide you with exceptional heart care, we have created designated Provider Care Teams.  These Care Teams include your primary Cardiologist (physician) and Advanced Practice Providers (APPs -  Physician Assistants and Nurse Practitioners) who all work together to provide you with the care you need, when you need it.  Your next appointment:   3 week(s) (After LexiScan is completed)  The format for your next appointment:   In Person  Provider:   Quay Burow, MD  You are scheduled for a Myocardial Perfusion Imaging Study on   Please arrive 15 minutes prior to your appointment time for registration and insurance purposes.  The test will take approximately 3 to 4 hours to complete; you may bring reading material.  If someone comes with you to your appointment, they will need to remain in the main lobby due to limited space in the testing area. **If you are pregnant or breastfeeding, please notify the nuclear lab prior to your appointment**  How to prepare for your Myocardial Perfusion Test:  Do not eat or drink 3 hours prior to your test, except you may have water.  Do not consume products containing caffeine (regular or decaffeinated) 12 hours prior to your test. (ex: coffee, chocolate, sodas, tea).  Do bring a list of your current medications with you.  If not listed below, you may take your medications as normal.  Do wear comfortable clothes (no dresses or overalls) and walking shoes, tennis shoes preferred (No heels or open toe shoes are allowed).  Do NOT wear cologne, perfume, aftershave, or lotions (deodorant is allowed).  If these instructions are not followed, your test will have to be rescheduled.  Please report to [place], [address] for your test.  If you have questions or concerns about your appointment, you can call the Nuclear Lab at [phone].  If you cannot keep your appointment, please provide 24 hours notification to the Nuclear Lab, to avoid a possible $50 charge to your account.

## 2020-02-16 ENCOUNTER — Other Ambulatory Visit: Payer: Self-pay | Admitting: Internal Medicine

## 2020-02-20 ENCOUNTER — Other Ambulatory Visit: Payer: Federal, State, Local not specified - PPO

## 2020-02-20 DIAGNOSIS — Z20822 Contact with and (suspected) exposure to covid-19: Secondary | ICD-10-CM

## 2020-02-21 LAB — NOVEL CORONAVIRUS, NAA: SARS-CoV-2, NAA: NOT DETECTED

## 2020-02-21 LAB — SARS-COV-2, NAA 2 DAY TAT

## 2020-02-28 ENCOUNTER — Ambulatory Visit (HOSPITAL_BASED_OUTPATIENT_CLINIC_OR_DEPARTMENT_OTHER): Payer: Federal, State, Local not specified - PPO

## 2020-02-28 ENCOUNTER — Other Ambulatory Visit: Payer: Self-pay

## 2020-02-28 ENCOUNTER — Telehealth (HOSPITAL_COMMUNITY): Payer: Self-pay | Admitting: *Deleted

## 2020-02-28 DIAGNOSIS — I10 Essential (primary) hypertension: Secondary | ICD-10-CM | POA: Insufficient documentation

## 2020-02-28 DIAGNOSIS — E7849 Other hyperlipidemia: Secondary | ICD-10-CM | POA: Insufficient documentation

## 2020-02-28 DIAGNOSIS — R072 Precordial pain: Secondary | ICD-10-CM

## 2020-02-28 DIAGNOSIS — Z951 Presence of aortocoronary bypass graft: Secondary | ICD-10-CM | POA: Diagnosis not present

## 2020-02-28 LAB — ECHOCARDIOGRAM COMPLETE
Area-P 1/2: 3.65 cm2
S' Lateral: 3.4 cm

## 2020-02-28 MED ORDER — PERFLUTREN LIPID MICROSPHERE
1.0000 mL | INTRAVENOUS | Status: AC | PRN
  Administered 2020-02-28: 1 mL via INTRAVENOUS

## 2020-02-28 NOTE — Telephone Encounter (Signed)
Close encounter 

## 2020-02-29 ENCOUNTER — Encounter (HOSPITAL_COMMUNITY): Payer: Federal, State, Local not specified - PPO

## 2020-02-29 ENCOUNTER — Ambulatory Visit (HOSPITAL_COMMUNITY)
Admission: RE | Admit: 2020-02-29 | Discharge: 2020-02-29 | Disposition: A | Discharge status: Home / Self Care | Payer: Federal, State, Local not specified - PPO | Source: Ambulatory Visit | Attending: Cardiology | Admitting: Cardiology

## 2020-02-29 DIAGNOSIS — E7849 Other hyperlipidemia: Secondary | ICD-10-CM | POA: Diagnosis not present

## 2020-02-29 DIAGNOSIS — Z951 Presence of aortocoronary bypass graft: Secondary | ICD-10-CM | POA: Diagnosis not present

## 2020-02-29 DIAGNOSIS — R072 Precordial pain: Secondary | ICD-10-CM

## 2020-02-29 DIAGNOSIS — I10 Essential (primary) hypertension: Secondary | ICD-10-CM | POA: Diagnosis not present

## 2020-02-29 MED ORDER — REGADENOSON 0.4 MG/5ML IV SOLN
0.4000 mg | Freq: Once | INTRAVENOUS | Status: AC
  Administered 2020-02-29: 0.4 mg via INTRAVENOUS

## 2020-02-29 MED ORDER — TECHNETIUM TC 99M TETROFOSMIN IV KIT
28.0000 | PACK | Freq: Once | INTRAVENOUS | Status: AC | PRN
  Administered 2020-02-29: 28 via INTRAVENOUS
  Filled 2020-02-29: qty 28

## 2020-02-29 MED ORDER — AMINOPHYLLINE 25 MG/ML IV SOLN
75.0000 mg | Freq: Once | INTRAVENOUS | Status: AC
  Administered 2020-02-29: 75 mg via INTRAVENOUS

## 2020-02-29 MED ORDER — TECHNETIUM TC 99M TETROFOSMIN IV KIT
9.9000 | PACK | Freq: Once | INTRAVENOUS | Status: AC | PRN
  Administered 2020-02-29: 9.9 via INTRAVENOUS
  Filled 2020-02-29: qty 10

## 2020-03-01 LAB — MYOCARDIAL PERFUSION IMAGING
LV dias vol: 86 mL (ref 46–106)
LV sys vol: 45 mL
Peak HR: 96 {beats}/min
Rest HR: 58 {beats}/min
SDS: 9
SRS: 13
SSS: 22
TID: 1.04

## 2020-03-06 ENCOUNTER — Ambulatory Visit: Payer: Federal, State, Local not specified - PPO | Admitting: Cardiovascular Disease

## 2020-03-06 ENCOUNTER — Other Ambulatory Visit: Payer: Self-pay

## 2020-03-06 ENCOUNTER — Encounter: Payer: Self-pay | Admitting: Cardiovascular Disease

## 2020-03-06 ENCOUNTER — Other Ambulatory Visit (HOSPITAL_COMMUNITY)
Admission: RE | Admit: 2020-03-06 | Discharge: 2020-03-06 | Disposition: A | Discharge status: Home / Self Care | Payer: Federal, State, Local not specified - PPO | Source: Ambulatory Visit | Attending: Cardiovascular Disease | Admitting: Cardiovascular Disease

## 2020-03-06 VITALS — BP 127/78 | HR 58 | Ht 60.0 in | Wt 137.2 lb

## 2020-03-06 DIAGNOSIS — Z01818 Encounter for other preprocedural examination: Secondary | ICD-10-CM

## 2020-03-06 DIAGNOSIS — Z20822 Contact with and (suspected) exposure to covid-19: Secondary | ICD-10-CM | POA: Insufficient documentation

## 2020-03-06 DIAGNOSIS — R9439 Abnormal result of other cardiovascular function study: Secondary | ICD-10-CM

## 2020-03-06 DIAGNOSIS — Z01812 Encounter for preprocedural laboratory examination: Secondary | ICD-10-CM

## 2020-03-06 LAB — SARS CORONAVIRUS 2 (TAT 6-24 HRS): SARS Coronavirus 2: NEGATIVE

## 2020-03-06 NOTE — Progress Notes (Signed)
[name] returns today for follow-up.  When I saw her on 02/14/2020 she was complaining of some dyspnea and back pain, similar to her infarct symptoms.  I did begin her on low-dose long-acting nitrate which markedly improved her symptoms.  A 2D echo showed fairly stable LV dysfunction with an EF in the 45 to 50% range and Myoview stress test performed 02/29/2020 showed anteroapical and septal scar with mild peri-infarct ischemia.  She missed a day of her Imdur and developed recurrent symptoms.  Based on this we decided to proceed with outpatient diagnostic coronary angiography this coming Thursday.  I have reviewed the risks, indications, and alternatives to cardiac catheterization, possible angioplasty, and stenting with the patient. Risks include but are not limited to bleeding, infection, vascular injury, stroke, myocardial infection, arrhythmia, kidney injury, radiation-related injury in the case of prolonged fluoroscopy use, emergency cardiac surgery, and death. The patient understands the risks of serious complication is 1-2 in 9507 with diagnostic cardiac cath and 1-2% or less with angioplasty/stenting.   Lorretta Harp, M.D., [place], [facility], [place] [address] [place], [place]  [address]  [phone] 03/06/2020 11:33 AM

## 2020-03-06 NOTE — Addendum Note (Signed)
Addended by: Vennie Homans on: 03/06/2020 11:46 AM   Modules accepted: Orders

## 2020-03-06 NOTE — Patient Instructions (Addendum)
Medication Instructions:  Continue current medications  *If you need a refill on your cardiac medications before your next appointment, please call your pharmacy*   Lab Work: Pre Op Labs  If you have labs (blood work) drawn today and your tests are completely normal, you will receive your results only by: Marland Kitchen MyChart Message (if you have MyChart) OR . A paper copy in the mail If you have any lab test that is abnormal or we need to change your treatment, we will call you to review the results.   Testing/Procedures: Your physician has requested that you have a cardiac catheterization. Cardiac catheterization is used to diagnose and/or treat various heart conditions. Doctors may recommend this procedure for a number of different reasons. The most common reason is to evaluate chest pain. Chest pain can be a symptom of coronary artery disease (CAD), and cardiac catheterization can show whether plaque is narrowing or blocking your heart's arteries. This procedure is also used to evaluate the valves, as well as measure the blood flow and oxygen levels in different parts of your heart. For further information please visit HugeFiesta.tn. Please follow instruction sheet, as given.   Follow-Up: At [facility], you and your health needs are our priority.  As part of our continuing mission to provide you with exceptional heart care, we have created designated Provider Care Teams.  These Care Teams include your primary Cardiologist (physician) and Advanced Practice Providers (APPs -  Physician Assistants and Nurse Practitioners) who all work together to provide you with the care you need, when you need it.  We recommend signing up for the patient portal called "MyChart".  Sign up information is provided on this After Visit Summary.  MyChart is used to connect with patients for Virtual Visits (Telemedicine).  Patients are able to view lab/test results, encounter notes, upcoming appointments, etc.   Non-urgent messages can be sent to your provider as well.   To learn more about what you can do with MyChart, go to [email or web address].    Your next appointment:   Wednesday November 3rd 11:15  The format for your next appointment:   In Person  Provider:   You may see Rachel Burow, MD or one of the following Advanced Practice Providers on your designated Care Team:    Kerin Ransom, PA-C  Sande Rives, [place]  Coletta Memos, [place]    Other Instructions    [place] [address] Dept: [phone] Loc: [phone]  [name]  03/06/2020  You are scheduled for a Cardiac Catheterization on Thursday, October 14 with Dr. Quay [name].  1. Please arrive at the [facility] (Main Entrance A) at [facility]: [address] [place], [place] [address] at 7:30 AM (This time is two hours before your procedure to ensure your preparation). Free valet parking service is available.   Special note: Every effort is made to have your procedure done on time. Please understand that emergencies sometimes delay scheduled procedures.  2. Diet: Do not eat solid foods after midnight.  The patient may have clear liquids until 5am upon the day of the procedure.  3. Labs: You will need to have blood drawn on Tuesday, October 12 at [place]  Open: [place] (Lunch 12:30 - 1:30)   Phone: [phone]. You do not need to be fasting.  COVID TEST: Tuesday October 12th. This is a Drive Up Visit  at [address] Barbara Cower [place], [place] [address]  4. Medication instructions in preparation for your procedure:   Contrast Allergy: No   Stop taking, Cozaar (Losartan) Thursday, October 14,   On the morning of your procedure, take your Aspirin and any morning medicines NOT listed above.  You may use sips of water.  5. Plan for one night  stay--bring personal belongings. 6. Bring a current list of your medications and current insurance cards. 7. You MUST have a responsible person to drive you home. 8. Someone MUST be with you the first 24 hours after you arrive home or your discharge will be delayed. 9. Please wear clothes that are easy to get on and off and wear slip-on shoes.  Thank you for allowing [place] to care for you!   -- [facility]

## 2020-03-06 NOTE — H&P (View-Only) (Signed)
Ms. [name] returns today for follow-up.  When I saw her on 02/14/2020 she was complaining of some dyspnea and back pain, similar to her infarct symptoms.  I did begin her on low-dose long-acting nitrate which markedly improved her symptoms.  A 2D echo showed fairly stable LV dysfunction with an EF in the 45 to 50% range and Myoview stress test performed 02/29/2020 showed anteroapical and septal scar with mild peri-infarct ischemia.  She missed a day of her Imdur and developed recurrent symptoms.  Based on this we decided to proceed with outpatient diagnostic coronary angiography this coming Thursday.  I have reviewed the risks, indications, and alternatives to cardiac catheterization, possible angioplasty, and stenting with the patient. Risks include but are not limited to bleeding, infection, vascular injury, stroke, myocardial infection, arrhythmia, kidney injury, radiation-related injury in the case of prolonged fluoroscopy use, emergency cardiac surgery, and death. The patient understands the risks of serious complication is 1-2 in 6378 with diagnostic cardiac cath and 1-2% or less with angioplasty/stenting.   Lorretta Harp, M.D., [place], [facility], [place] [address] [place], [place]  [address]  [phone] 03/06/2020 11:33 AM

## 2020-03-07 ENCOUNTER — Telehealth: Payer: Self-pay | Admitting: *Deleted

## 2020-03-07 LAB — BASIC METABOLIC PANEL
BUN/Creatinine Ratio: 20 (ref 9–23)
BUN: 15 mg/dL (ref 6–24)
CO2: 23 mmol/L (ref 20–29)
Calcium: 10.3 mg/dL — ABNORMAL HIGH (ref 8.7–10.2)
Chloride: 98 mmol/L (ref 96–106)
Creatinine, Ser: 0.74 mg/dL (ref 0.57–1.00)
GFR calc Af Amer: 108 mL/min/{1.73_m2} (ref >59)
GFR calc non Af Amer: 94 mL/min/{1.73_m2} (ref >59)
Glucose: 253 mg/dL — ABNORMAL HIGH (ref 65–99)
Potassium: 4.3 mmol/L (ref 3.5–5.2)
Sodium: 138 mmol/L (ref 134–144)

## 2020-03-07 LAB — CBC
Hematocrit: 39.2 % (ref 34.0–46.6)
Hemoglobin: 13.1 g/dL (ref 11.1–15.9)
MCH: 29.4 pg (ref 26.6–33.0)
MCHC: 33.4 g/dL (ref 31.5–35.7)
MCV: 88 fL (ref 79–97)
Platelets: 302 10*3/uL (ref 150–450)
RBC: 4.46 x10E6/uL (ref 3.77–5.28)
RDW: 12.3 % (ref 11.7–15.4)
WBC: 5.5 10*3/uL (ref 3.4–10.8)

## 2020-03-07 NOTE — Telephone Encounter (Signed)
Pt contacted pre-catheterization scheduled at [facility] for: Thursday March 08, 2020 9:30 AM Verified arrival time and place: [facility]) at: 7:30 AM   No solid food after midnight prior to cath, clear liquids until 5 AM day of procedure.  Hold: Spironolactone -AM of procedure  Except hold medications AM meds can be  taken pre-cath with sips of water including: ASA 81 mg   Confirmed patient has responsible adult to drive home post procedure and be with patient first 24 hours after arriving home: yes  You are allowed ONE visitor in the waiting room during the time you are at the hospital for your procedure. Both you and your visitor must wear a mask once you enter the hospital.       COVID-19 Pre-Screening Questions:   In the past 14 days have you had a new cough, new headache, new nasal congestion, fever (100.4 or greater) unexplained body aches, new sore throat, or sudden loss of taste or sense of smell? no  In the past 14 days have you been around anyone with known Covid 19? no  Have you been vaccinated for COVID-19? Yes, see immunization history  Reviewed procedure/mask/visitor instructions, COVID-19 questions with patient.

## 2020-03-08 ENCOUNTER — Ambulatory Visit (HOSPITAL_COMMUNITY)
Admission: RE | Admit: 2020-03-08 | Discharge: 2020-03-08 | Disposition: A | Discharge status: Home / Self Care | Payer: Federal, State, Local not specified - PPO | Attending: Cardiovascular Disease | Admitting: Cardiovascular Disease

## 2020-03-08 ENCOUNTER — Encounter (HOSPITAL_COMMUNITY)
Admission: RE | Disposition: A | Discharge status: Home / Self Care | Payer: Self-pay | Source: Home / Self Care | Attending: Cardiovascular Disease

## 2020-03-08 ENCOUNTER — Other Ambulatory Visit: Payer: Self-pay

## 2020-03-08 DIAGNOSIS — E7849 Other hyperlipidemia: Secondary | ICD-10-CM | POA: Insufficient documentation

## 2020-03-08 DIAGNOSIS — I2510 Atherosclerotic heart disease of native coronary artery without angina pectoris: Secondary | ICD-10-CM

## 2020-03-08 DIAGNOSIS — I2582 Chronic total occlusion of coronary artery: Secondary | ICD-10-CM | POA: Insufficient documentation

## 2020-03-08 DIAGNOSIS — R079 Chest pain, unspecified: Secondary | ICD-10-CM

## 2020-03-08 DIAGNOSIS — Z951 Presence of aortocoronary bypass graft: Secondary | ICD-10-CM

## 2020-03-08 DIAGNOSIS — I10 Essential (primary) hypertension: Secondary | ICD-10-CM | POA: Diagnosis not present

## 2020-03-08 DIAGNOSIS — R9439 Abnormal result of other cardiovascular function study: Secondary | ICD-10-CM | POA: Diagnosis not present

## 2020-03-08 HISTORY — PX: LEFT HEART CATH AND CORS/GRAFTS ANGIOGRAPHY: CATH118250

## 2020-03-08 SURGERY — LEFT HEART CATH AND CORS/GRAFTS ANGIOGRAPHY
Anesthesia: LOCAL

## 2020-03-08 MED ORDER — LIDOCAINE HCL (PF) 1 % IJ SOLN
INTRAMUSCULAR | Status: DC | PRN
  Administered 2020-03-08: 10 mL

## 2020-03-08 MED ORDER — MIDAZOLAM HCL 2 MG/2ML IJ SOLN
INTRAMUSCULAR | Status: DC | PRN
  Administered 2020-03-08: 1 mg via INTRAVENOUS

## 2020-03-08 MED ORDER — SODIUM CHLORIDE 0.9 % IV SOLN: 250.0000 mL | INTRAVENOUS | Status: DC | PRN

## 2020-03-08 MED ORDER — FENTANYL CITRATE (PF) 100 MCG/2ML IJ SOLN
INTRAMUSCULAR | Status: AC
  Filled 2020-03-08: qty 2

## 2020-03-08 MED ORDER — SODIUM CHLORIDE 0.9% FLUSH: 3.0000 mL | INTRAVENOUS | Status: DC | PRN

## 2020-03-08 MED ORDER — SODIUM CHLORIDE 0.9 % WEIGHT BASED INFUSION: 1.0000 mL/kg/h | INTRAVENOUS | Status: DC

## 2020-03-08 MED ORDER — SODIUM CHLORIDE 0.9 % WEIGHT BASED INFUSION
3.0000 mL/kg/h | INTRAVENOUS | Status: AC
  Administered 2020-03-08: 3 mL/kg/h via INTRAVENOUS

## 2020-03-08 MED ORDER — SODIUM CHLORIDE 0.9 % IV SOLN: INTRAVENOUS | Status: AC

## 2020-03-08 MED ORDER — HEPARIN (PORCINE) IN NACL 1000-0.9 UT/500ML-% IV SOLN
INTRAVENOUS | Status: DC | PRN
  Administered 2020-03-08 (×2): 500 mL

## 2020-03-08 MED ORDER — HYDRALAZINE HCL 20 MG/ML IJ SOLN: 10.0000 mg | INTRAMUSCULAR | Status: DC | PRN

## 2020-03-08 MED ORDER — ASPIRIN 81 MG PO CHEW: 81.0000 mg | CHEWABLE_TABLET | Freq: Every day | ORAL | Status: DC

## 2020-03-08 MED ORDER — MORPHINE SULFATE (PF) 2 MG/ML IV SOLN: 2.0000 mg | INTRAVENOUS | Status: DC | PRN

## 2020-03-08 MED ORDER — IOHEXOL 350 MG/ML SOLN
INTRAVENOUS | Status: DC | PRN
  Administered 2020-03-08: 30 mL

## 2020-03-08 MED ORDER — ONDANSETRON HCL 4 MG/2ML IJ SOLN: 4.0000 mg | Freq: Four times a day (QID) | INTRAMUSCULAR | Status: DC | PRN

## 2020-03-08 MED ORDER — LIDOCAINE HCL (PF) 1 % IJ SOLN
INTRAMUSCULAR | Status: AC
  Filled 2020-03-08: qty 30

## 2020-03-08 MED ORDER — LABETALOL HCL 5 MG/ML IV SOLN: 10.0000 mg | INTRAVENOUS | Status: DC | PRN

## 2020-03-08 MED ORDER — MIDAZOLAM HCL 2 MG/2ML IJ SOLN
INTRAMUSCULAR | Status: AC
  Filled 2020-03-08: qty 2

## 2020-03-08 MED ORDER — SODIUM CHLORIDE 0.9% FLUSH: 3.0000 mL | Freq: Two times a day (BID) | INTRAVENOUS | Status: DC

## 2020-03-08 MED ORDER — FENTANYL CITRATE (PF) 100 MCG/2ML IJ SOLN
INTRAMUSCULAR | Status: DC | PRN
Start: 2020-03-08 — End: 2020-03-08
  Administered 2020-03-08: 25 ug via INTRAVENOUS

## 2020-03-08 MED ORDER — ACETAMINOPHEN 325 MG PO TABS: 650.0000 mg | ORAL_TABLET | ORAL | Status: DC | PRN

## 2020-03-08 MED ORDER — ASPIRIN 81 MG PO CHEW: 81.0000 mg | CHEWABLE_TABLET | ORAL | Status: DC

## 2020-03-08 SURGICAL SUPPLY — 8 items
CATH INFINITI 5FR MULTPACK ANG (CATHETERS) ×2 IMPLANT
KIT HEART LEFT (KITS) ×2 IMPLANT
PACK CARDIAC CATHETERIZATION (CUSTOM PROCEDURE TRAY) ×2 IMPLANT
SHEATH PINNACLE 5F 10CM (SHEATH) ×2 IMPLANT
SHEATH PROBE COVER 6X72 (BAG) ×2 IMPLANT
TRANSDUCER W/STOPCOCK (MISCELLANEOUS) ×2 IMPLANT
TUBING CIL FLEX 10 FLL-RA (TUBING) ×2 IMPLANT
WIRE EMERALD 3MM-J .035X150CM (WIRE) ×2 IMPLANT

## 2020-03-08 NOTE — Interval H&P Note (Signed)
Cath Lab Visit (complete for each Cath Lab visit)  Clinical Evaluation Leading to the Procedure:   ACS: No.  Non-ACS:    Anginal Classification: CCS II  Anti-ischemic medical therapy: Maximal Therapy (2 or more classes of medications)  Non-Invasive Test Results: Intermediate-risk stress test findings: cardiac mortality 1-3%/year  Prior CABG: Previous CABG      History and Physical Interval Note:  03/08/2020 9:19 AM  [name]  has presented today for surgery, with the diagnosis of abnormal stress test.  The various methods of treatment have been discussed with the patient and family. After consideration of risks, benefits and other options for treatment, the patient has consented to  Procedure(s): LEFT HEART CATH AND CORS/GRAFTS ANGIOGRAPHY (N/A) as a surgical intervention.  The patient's history has been reviewed, patient examined, no change in status, stable for surgery.  I have reviewed the patient's chart and labs.  Questions were answered to the patient's satisfaction.     Quay Burow

## 2020-03-08 NOTE — Progress Notes (Signed)
Site area- right  Site Prior to Removal- 0   Pressure Applied For-  17  [place]   Bedrest Beginning at - 1020   Manual- Yes   Patient Status During Pull- Stable    Post Pull Groin Site- 0   Post Pull Instructions Given- Yes   Post Pull Pulses Present- Yes    Dressing Applied- Tegaderm and Gauze Dressing    Comments:

## 2020-03-08 NOTE — Discharge Instructions (Signed)
Femoral Site Care This sheet gives you information about how to care for yourself after your procedure. Your health care provider may also give you more specific instructions. If you have problems or questions, contact your health care provider. What can I expect after the procedure? After the procedure, it is common to have:  Bruising that usually fades within 1-2 weeks.  Tenderness at the site. Follow these instructions at home: Wound care  Follow instructions from your health care provider about how to take care of your insertion site. Make sure you: ? Wash your hands with soap and water before you change your bandage (dressing). If soap and water are not available, use hand sanitizer. ? Change your dressing as told by your health care provider. ? Leave stitches (sutures), skin glue, or adhesive strips in place. These skin closures may need to stay in place for 2 weeks or longer. If adhesive strip edges start to loosen and curl up, you may trim the loose edges. Do not remove adhesive strips completely unless your health care provider tells you to do that.  Do not take baths, swim, or use a hot tub until your health care provider approves.  You may shower 24-48 hours after the procedure or as told by your health care provider. ? Gently wash the site with plain soap and water. ? Pat the area dry with a clean towel. ? Do not rub the site. This may cause bleeding.  Do not apply powder or lotion to the site. Keep the site clean and dry.  Check your femoral site every day for signs of infection. Check for: ? Redness, swelling, or pain. ? Fluid or blood. ? Warmth. ? Pus or a bad smell. Activity  For the first 2-3 days after your procedure, or as long as directed: ? Avoid climbing stairs as much as possible. ? Do not squat.  Do not lift anything that is heavier than 10 lb (4.5 kg), or the limit that you are told, until your health care provider says that it is safe.  Rest as  directed. ? Avoid sitting for a long time without moving. Get up to take short walks every 1-2 hours.  Do not drive for 24 hours if you were given a medicine to help you relax (sedative). General instructions  Take over-the-counter and prescription medicines only as told by your health care provider.  Keep all follow-up visits as told by your health care provider. This is important. Contact a health care provider if you have:  A fever or chills.  You have redness, swelling, or pain around your insertion site. Get help right away if:  The catheter insertion area swells very fast.  You pass out.  You suddenly start to sweat or your skin gets clammy.  The catheter insertion area is bleeding, and the bleeding does not stop when you hold steady pressure on the area.  The area near or just beyond the catheter insertion site becomes pale, cool, tingly, or numb. These symptoms may represent a serious problem that is an emergency. Do not wait to see if the symptoms will go away. Get medical help right away. Call your local emergency services (911 in the [place] Do not drive yourself to the hospital. Summary  After the procedure, it is common to have bruising that usually fades within 1-2 weeks.  Check your femoral site every day for signs of infection.  Do not lift anything that is heavier than 10 lb (4.5 kg), or the   limit that you are told, until your health care provider says that it is safe. This information is not intended to replace advice given to you by your health care provider. Make sure you discuss any questions you have with your health care provider. Document Revised: 05/25/2017 Document Reviewed: 05/25/2017 Elsevier Patient Education  2020 Elsevier Inc.  

## 2020-03-08 NOTE — Progress Notes (Signed)
Patient and husband was given discharge instructions. Both verbalized understanding. 

## 2020-03-09 ENCOUNTER — Encounter (HOSPITAL_COMMUNITY): Payer: Self-pay | Admitting: Cardiovascular Disease

## 2020-03-28 ENCOUNTER — Ambulatory Visit: Payer: Federal, State, Local not specified - PPO | Admitting: Cardiovascular Disease

## 2020-03-28 ENCOUNTER — Encounter: Payer: Self-pay | Admitting: Cardiovascular Disease

## 2020-03-28 ENCOUNTER — Other Ambulatory Visit: Payer: Self-pay

## 2020-03-28 DIAGNOSIS — R9439 Abnormal result of other cardiovascular function study: Secondary | ICD-10-CM

## 2020-03-28 DIAGNOSIS — I255 Ischemic cardiomyopathy: Secondary | ICD-10-CM

## 2020-03-28 NOTE — Assessment & Plan Note (Signed)
EF by recent echo performed 02/28/2020 was 45 to 50% which is remained fairly stable.  She is otherwise asymptomatic on losartan and metoprolol, and spironolactone.

## 2020-03-28 NOTE — Patient Instructions (Signed)
Follow-Up: At [facility], you and your health needs are our priority.  As part of our continuing mission to provide you with exceptional heart care, we have created designated Provider Care Teams.  These Care Teams include your primary Cardiologist (physician) and Advanced Practice Providers (APPs -  Physician Assistants and Nurse Practitioners) who all work together to provide you with the care you need, when you need it.  We recommend signing up for the patient portal called "MyChart".  Sign up information is provided on this After Visit Summary.  MyChart is used to connect with patients for Virtual Visits (Telemedicine).  Patients are able to view lab/test results, encounter notes, upcoming appointments, etc.  Non-urgent messages can be sent to your provider as well.   To learn more about what you can do with MyChart, go to [email or web address].    Your next appointment:   6 month(s)  The format for your next appointment:   In Person  Provider:   You will see one of the following Advanced Practice Providers on your designated Care Team:    Kerin Ransom, PA-C  Ackerman, [place]  Coletta Memos, [place]  Then, Quay Burow, MD will plan to see you again in 12 month(s).

## 2020-03-28 NOTE — Assessment & Plan Note (Signed)
Ms. [name] returns today for post hospital follow-up.  She was complaining of some dyspnea and back pain similar to her prebypass symptoms.  She did have a LIMA to her LAD performed emergently by Dr. Cyndia Bent 07/12/2016.  She had a Myoview stress test performed 02/29/2020 that showed anteroapical scar with peri-infarct ischemia.  Based on this I performed diagnostic coronary angiography on her 03/08/2020 revealing occluded ostial LAD with a patent LIMA and otherwise no significant CAD.  She is currently asymptomatic.

## 2020-03-28 NOTE — Progress Notes (Signed)
03/28/2020 [name]   1970/11/18  [id number]  Primary Physician Lawerance Cruel, MD Primary Cardiologist: Lorretta Harp MD Lupe Carney, [place]  HPI:  [name] is a 49 y.o.  mildly overweight married Caucasian female mother of one 28 year old daughter. I last saw her in the office 03/06/2020.She underwentcoronary artery bypass grafting times one emergently by Dr. Cyndia Bent on 07/12/16. The remainder of her coronary arteries were unremarkable. She did have a non-STEMI on that day with progressively rising troponins and EKG changes. She had a 99% ostial LAD with anteroapical wall motion abnormality. She is recuperating nicely. She is about to start cardiac rehabilitation.She denies chest pain or shortness of breath. She does complain of some cramping in her legs which may be statin-related. Her last echo performed 10/21/16 revealed ejection fraction of 40-45% with wall motion and a all motion abnormality in the LAD distribution. She was begun on carvedilol which apparently she did not tolerate. She was alsoplaced on Repathawhich did not affect her lipid profileas well as Praluent.She was referred to Dr. Reubin Milan for genetic testing for presumed FH. Repeat 2-D echo performed 05/06/17 revealed an EF of 40-45% with anteroapical hypokinesia.  I did refer her to Dr. [name] for advanced lipid evaluation therapy. 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 She was unresponsive to statin therapy and PCSK9. She is now being tried on Juxtapid. I also sent her to Dr. [name] in the advanced heart failure clinic. She is on optimal medical therapy for this. She is now NYHA class I-2 is relatively asymptomatic with an EF of 45 to 50%.  She was admitted 09/01/2018 for 1 day. She ruled out for myocardial infarction. She had no acute EKG changes. A 2D echo was performed that showed an EF in the 35 to 40% range with apical aneurysm and inferior hypokinesia. She was seen by Dr. Harrington Challenger  and sent home for noncardiac chest pain. Since being at home she is been asymptomatic. She was taken off her beta-blocker because of bradycardia on telemetry although now she complains of tachycardia.I did place her back on a beta-blocker  She has been seeing Dr. [name] for lipid management with incredible results.  She is complained of some fatigue, dyspnea on exertion since she is been going back to the gym and some back pain similar to her infarct pain.  She had a 2D echo performed 02/28/2020 that showed an EF in the 45 to 50% range and a Myoview stress test performed the following day that showed anteroapical scar with peri-infarct ischemia.  Based on this I performed diagnostic coronary angiography 03/08/2020 revealing an occluded ostial LAD, patent LIMA to the LAD and otherwise no significant CAD.  Since that time she is remained asymptomatic.   Current Meds  Medication Sig  . aspirin 81 MG tablet Take 1 tablet (81 mg total) by mouth daily.  . Coenzyme Q10 (COQ10) 200 MG CAPS Take 200 mg by mouth at bedtime.   Marland Kitchen ezetimibe (ZETIA) 10 MG tablet TAKE 1 TABLET BY MOUTH EVERY DAY (Patient taking differently: Take 10 mg by mouth at bedtime. )  . isosorbide mononitrate (IMDUR) 30 MG 24 hr tablet TAKE 1/2 TABLET(15 MG) BY MOUTH DAILY (Patient taking differently: Take 15 mg by mouth daily. )  . losartan (COZAAR) 25 MG tablet TAKE 1 TABLET BY MOUTH EVERY DAY (Patient taking differently: Take 25 mg by mouth at bedtime. )  . metoprolol succinate (TOPROL-XL) 25 MG 24 hr tablet Take 1 tablet (  25 mg total) by mouth daily. (Patient taking differently: Take 25 mg by mouth at bedtime. )  . NEXLETOL 180 MG TABS TAKE 1 TABLET BY MOUTH EVERY DAY (Patient taking differently: Take 180 mg by mouth at bedtime. )  . nitroGLYCERIN (NITROSTAT) 0.4 MG SL tablet Place 1 tablet (0.4 mg total) under the tongue every 5 (five) minutes x 3 doses as needed for chest pain.  . Omega-3 Fatty Acids (OMEGA-3 PO) Take 1 capsule by  mouth daily with breakfast. 369  . REPATHA SURECLICK 884 MG/ML SOAJ INJECT 1 PEN SUBCUTANEOUSLY EVERY 14 DAYS (Patient taking differently: Inject 140 mg into the skin every 14 (fourteen) days. )  . spironolactone (ALDACTONE) 25 MG tablet TAKE 1 TABLET BY MOUTH DAILY (Patient taking differently: Take 25 mg by mouth daily. )     Allergies  Allergen Reactions  . Statins Other (See Comments)    Muscle aches with higher doses of: Crestor & Lipitor  . Bactrim Itching and Rash         Social History   Socioeconomic History  . Marital status: Married    Spouse name: Not on file  . Number of children: Not on file  . Years of education: Not on file  . Highest education level: Not on file  Occupational History  . Not on file  Tobacco Use  . Smoking status: Never Smoker  . Smokeless tobacco: Never Used  Substance and Sexual Activity  . Alcohol use: No  . Drug use: No  . Sexual activity: Yes    Birth control/protection: None  Other Topics Concern  . Not on file  Social History Narrative  . Not on file   Social Determinants of Health   Financial Resource Strain:   . Difficulty of Paying Living Expenses: Not on file  Food Insecurity:   . Worried About Charity fundraiser in the Last Year: Not on file  . Ran Out of Food in the Last Year: Not on file  Transportation Needs:   . Lack of Transportation (Medical): Not on file  . Lack of Transportation (Non-Medical): Not on file  Physical Activity:   . Days of Exercise per Week: Not on file  . Minutes of Exercise per Session: Not on file  Stress:   . Feeling of Stress : Not on file  Social Connections:   . Frequency of Communication with Friends and Family: Not on file  . Frequency of Social Gatherings with Friends and Family: Not on file  . Attends Religious Services: Not on file  . Active Member of Clubs or Organizations: Not on file  . Attends Archivist Meetings: Not on file  . Marital Status: Not on file  Intimate  Partner Violence:   . Fear of Current or Ex-Partner: Not on file  . Emotionally Abused: Not on file  . Physically Abused: Not on file  . Sexually Abused: Not on file     Review of Systems: General: negative for chills, fever, night sweats or weight changes.  Cardiovascular: negative for chest pain, dyspnea on exertion, edema, orthopnea, palpitations, paroxysmal nocturnal dyspnea or shortness of breath Dermatological: negative for rash Respiratory: negative for cough or wheezing Urologic: negative for hematuria Abdominal: negative for nausea, vomiting, diarrhea, bright red blood per rectum, melena, or hematemesis Neurologic: negative for visual changes, syncope, or dizziness All other systems reviewed and are otherwise negative except as noted above.    Blood pressure 114/82, pulse 65, height 5' (1.524 m), weight 136  lb 3.2 oz (61.8 kg), last menstrual period 08/10/2011, SpO2 97 %.  General appearance: alert and no distress Neck: no adenopathy, no carotid bruit, no JVD, supple, symmetrical, trachea midline and thyroid not enlarged, symmetric, no tenderness/mass/nodules Lungs: clear to auscultation bilaterally Heart: regular rate and rhythm, S1, S2 normal, no murmur, click, rub or gallop Extremities: extremities normal, atraumatic, no cyanosis or edema Pulses: 2+ and symmetric Skin: Skin color, texture, turgor normal. No rashes or lesions Neurologic: Alert and oriented X 3, normal strength and tone. Normal symmetric reflexes. Normal coordination and gait  EKG not performed today  ASSESSMENT AND PLAN:   Abnormal nuclear stress test [name] returns today for post hospital follow-up.  She was complaining of some dyspnea and back pain similar to her prebypass symptoms.  She did have a LIMA to her LAD performed emergently by Dr. Cyndia Bent 07/12/2016.  She had a Myoview stress test performed 02/29/2020 that showed anteroapical scar with peri-infarct ischemia.  Based on this I performed  diagnostic coronary angiography on her 03/08/2020 revealing occluded ostial LAD with a patent LIMA and otherwise no significant CAD.  She is currently asymptomatic.  Ischemic cardiomyopathy EF by recent echo performed 02/28/2020 was 45 to 50% which is remained fairly stable.  She is otherwise asymptomatic on losartan and metoprolol, and spironolactone.      Lorretta Harp MD FACP,FACC,FAHA, [facility] 03/28/2020 11:47 AM

## 2020-04-03 ENCOUNTER — Other Ambulatory Visit: Payer: Self-pay | Admitting: Internal Medicine

## 2020-04-03 ENCOUNTER — Other Ambulatory Visit: Payer: Self-pay | Admitting: Cardiovascular Disease

## 2020-05-15 ENCOUNTER — Other Ambulatory Visit: Payer: Self-pay | Admitting: Cardiovascular Disease

## 2020-05-18 ENCOUNTER — Other Ambulatory Visit: Payer: Self-pay | Admitting: Internal Medicine

## 2020-06-17 ENCOUNTER — Other Ambulatory Visit: Payer: Self-pay | Admitting: Cardiovascular Disease

## 2020-08-10 ENCOUNTER — Other Ambulatory Visit: Payer: Self-pay | Admitting: Cardiovascular Disease

## 2020-08-17 ENCOUNTER — Other Ambulatory Visit: Payer: Self-pay | Admitting: Cardiovascular Disease

## 2020-08-23 ENCOUNTER — Other Ambulatory Visit: Payer: Self-pay | Admitting: Cardiovascular Disease

## 2020-09-12 ENCOUNTER — Other Ambulatory Visit: Payer: Self-pay | Admitting: Internal Medicine

## 2020-09-12 MED ORDER — REPATHA SURECLICK 140 MG/ML ~~LOC~~ SOAJ: SUBCUTANEOUS | 4 refills | Status: DC

## 2020-09-12 NOTE — Telephone Encounter (Signed)
repatha refilled to [facility] per faxed request

## 2020-09-18 ENCOUNTER — Telehealth: Payer: Self-pay

## 2020-09-18 NOTE — Telephone Encounter (Signed)
Request sent to [facility] Via to place PA for nexletol.

## 2020-09-19 NOTE — Telephone Encounter (Signed)
**Note De-Identified Daxtyn Rottenberg Obfuscation** Nexletol PA started through covermymeds. Key: [id number]

## 2020-09-26 NOTE — Telephone Encounter (Signed)
No denial letter received to date. Per chart review, appeal process has been completed for Nexletol in the past - appeals letter in the chart from 07/2019. Patient has to compose a letter to her insurance company stating she is requesting an appeal and that Dr. Debbrah Alar staff is acting on her behalf.

## 2020-09-26 NOTE — Telephone Encounter (Addendum)
**Note De-Identified Tal Neer Obfuscation** Message received Sybrina Laning covermymeds: Mahreen Krenz Key: [id number] - PA Case ID: [id number] Outcome: Denied on April 28 Your PA request has been denied. Additional information will be provided in the denial communication.  Drug: Nexletol 180MG  tablets Form: Charity fundraiser PA Form [id number] [place])  I have been awaiting the denial letter but have not received it.  I called [employer] and s/w Constance Holster who states that the denial letter was faxed to fax#: [phone] on 4/28 which is our [place] office but it was not forwarded to me.  I did provide Constance Holster with our [facility] number so I will be sure to get the denial letter that she states she is faxing now.  Constance Holster offered to transfer my call to a PA agent who could assist me with the reason for the denial and hopefully allow an appeal over the phone. I s/w [place] and for whatever reason my call was dropped(?)   I called back and s/w Louie Casa who transferred my call to the PA dept and I s/w Hassan Rowan. Per Hassan Rowan this Nexeltol PA was denied because the pt is taking a PCSK9 drug (Repatha) and the two medications cannot be taken together.  Per Hassan Rowan the only way to start an appeal is if the pt starts it by sending them a letter. I do not know the details as I never received the denial letter but hope [employer] is faxing it to the [employer] office ATTN: LYNN.  I will discuss with PharmD.

## 2020-09-26 NOTE — Telephone Encounter (Signed)
I received a denial letter.   Where should I send it to?

## 2020-09-27 NOTE — Telephone Encounter (Signed)
**Note De-Identified Ettel Albergo Obfuscation** Please fax to [place] at [phone]. Thank you.

## 2020-09-28 NOTE — Telephone Encounter (Signed)
Appeal info faxed to [facility] FEP - letter, last MD note, last set of labs, and copy of denial letter

## 2020-09-28 NOTE — Telephone Encounter (Signed)
**Note De-Identified [name] Obfuscation** Nexeltol appeal letter has been written, printed, and emailed, to [facility] nurse so she can obtain his signature on the letter and to fax to [facility] at the fax number written on the cover letter included.  The pt states that she will write her appeal letter and mail it to [facility] herself as she did last year.  She thanked me for our assistance with this appeal for her Nexletol.

## 2020-10-11 ENCOUNTER — Telehealth: Payer: Self-pay | Admitting: Internal Medicine

## 2020-10-11 NOTE — Telephone Encounter (Signed)
Called [employer] FEP Patient is approved for Nexletol until 09/29/2021 Approval letter should be faxed

## 2020-10-11 NOTE — Telephone Encounter (Signed)
PA for repatha submitted via [email or web address] Patient is approved until 10/10/2021 per call made to [facility] FEP

## 2020-11-12 ENCOUNTER — Telehealth: Payer: Self-pay | Admitting: Internal Medicine

## 2020-11-12 DIAGNOSIS — E7849 Other hyperlipidemia: Secondary | ICD-10-CM

## 2020-11-12 NOTE — Telephone Encounter (Signed)
Lipid/liver panel ordered, to be completed about 1 week before next visit with Dr. [name] MyChart message sent to patient

## 2021-01-29 ENCOUNTER — Other Ambulatory Visit: Payer: Self-pay | Admitting: Internal Medicine

## 2021-02-13 DIAGNOSIS — E7849 Other hyperlipidemia: Secondary | ICD-10-CM | POA: Diagnosis not present

## 2021-02-13 LAB — LIPID PANEL
Chol/HDL Ratio: 4.6 ratio — ABNORMAL HIGH (ref 0.0–4.4)
Cholesterol, Total: 152 mg/dL (ref 100–199)
HDL: 33 mg/dL — ABNORMAL LOW (ref >39)
LDL Chol Calc (NIH): 69 mg/dL (ref 0–99)
Triglycerides: 313 mg/dL — ABNORMAL HIGH (ref 0–149)
VLDL Cholesterol Cal: 50 mg/dL — ABNORMAL HIGH (ref 5–40)

## 2021-02-13 LAB — HEPATIC FUNCTION PANEL
ALT: 31 IU/L (ref 0–32)
AST: 32 IU/L (ref 0–40)
Albumin: 4.8 g/dL (ref 3.8–4.9)
Alkaline Phosphatase: 64 IU/L (ref 44–121)
Bilirubin Total: 0.3 mg/dL (ref 0.0–1.2)
Bilirubin, Direct: 0.14 mg/dL (ref 0.00–0.40)
Total Protein: 7.3 g/dL (ref 6.0–8.5)

## 2021-02-22 ENCOUNTER — Ambulatory Visit: Payer: Federal, State, Local not specified - PPO | Admitting: Internal Medicine

## 2021-02-22 ENCOUNTER — Encounter: Payer: Self-pay | Admitting: Internal Medicine

## 2021-02-22 ENCOUNTER — Other Ambulatory Visit: Payer: Self-pay

## 2021-02-22 VITALS — BP 126/74 | HR 59 | Ht 60.0 in | Wt 132.0 lb

## 2021-02-22 DIAGNOSIS — T466X5A Adverse effect of antihyperlipidemic and antiarteriosclerotic drugs, initial encounter: Secondary | ICD-10-CM

## 2021-02-22 DIAGNOSIS — I255 Ischemic cardiomyopathy: Secondary | ICD-10-CM | POA: Diagnosis not present

## 2021-02-22 DIAGNOSIS — M791 Myalgia, unspecified site: Secondary | ICD-10-CM

## 2021-02-22 DIAGNOSIS — Z951 Presence of aortocoronary bypass graft: Secondary | ICD-10-CM

## 2021-02-22 DIAGNOSIS — E7849 Other hyperlipidemia: Secondary | ICD-10-CM

## 2021-02-22 NOTE — Patient Instructions (Signed)
Medication Instructions:  NO CHANGES  *If you need a refill on your cardiac medications before your next appointment, please call your pharmacy*   Lab Work: FASTING lab work to check cholesterol & liver enzymes in 6 months  If you have labs (blood work) drawn today and your tests are completely normal, you will receive your results only by: [place] (if you have MyChart) OR A paper copy in the mail If you have any lab test that is abnormal or we need to change your treatment, we will call you to review the results.   Follow-Up: At [facility], you and your health needs are our priority.  As part of our continuing mission to provide you with exceptional heart care, we have created designated Provider Care Teams.  These Care Teams include your primary Cardiologist (physician) and Advanced Practice Providers (APPs -  Physician Assistants and Nurse Practitioners) who all work together to provide you with the care you need, when you need it.  We recommend signing up for the patient portal called "MyChart".  Sign up information is provided on this After Visit Summary.  MyChart is used to connect with patients for Virtual Visits (Telemedicine).  Patients are able to view lab/test results, encounter notes, upcoming appointments, etc.  Non-urgent messages can be sent to your provider as well.   To learn more about what you can do with MyChart, go to [email or web address].    Your next appointment:   6 month(s) - lipid clinic  The format for your next appointment:   In Person  Provider:   Raliegh Ip Mali Hilty, MD  ** you are due for an annual 1 year visit with Dr. [name] in November 2022   Other [place] [email or web address]  Member Hours Mon-Friday: 5:30 a.m. - 9 p.m. Sat-Sun: 8 a.m. - 5 p.m. Closed on Easter & Christmas  Address [address] (inside [place]) [place], [place] [address] Get Directions  Phone: [phone]

## 2021-02-22 NOTE — Progress Notes (Signed)
OFFICE NOTE  Chief Complaint:  Follow-up dyslipidemia  Primary Care Physician: Lawerance Cruel, MD  HPI:  [name] is a 50 y.o. female with a past medial history significant for emergent single-vessel coronary artery bypass grafting with a 99% ostial LAD stenosis in February 2018.  She was not found to have any other significant obstructive coronary disease at the time.  Echo in May 2018 revealed an LVEF of 40-45% and an LAD wall motion abnormality.  He is a patient of Dr. [name].  She has an extensive history of dyslipidemia and was told by her uncle when she was a child in [place] that her cholesterol was very high and that she would likely need to be on treatment.  She is adopted and does not know her birth parents, but does have a daughter who is 63 years old.  She has not had cholesterol testing to her knowledge.  She had been started on statins, but reports significant intolerance, this is to high-dose atorvastatin, rosuvastatin, and possibly others that she cannot recall.  She was then referred to start [place].  Her lipid profile as of July 2018 showed total cholesterol 246, triglycerides 237, HDL 35 and LDL 164.  After starting therapy on Repatha, she received 4 doses, approximately 2 months of therapy and her repeat lipid profile showed a total cholesterol 265, triglycerides 220, LDL 176 and HDL of 45.  This is suggesting that she has had no response to the PCS canine inhibitor.  She reports compliance with the medication.  She had no side effects from it.  Possible reasons for this include a PCSK9 gain of function mutation or LDL receptor mutation.  Findings are possibly suggestive of familial hyperlipidemia although her LDL is moderately elevated.  Initially the plan was to consider switching her to Praluent and she has been provided samples, however, it is likely that she will have a similar poor response.  10/30/2017  [name] returns today for follow-up of dyslipidemia.  There  is clinical concern for familial hyperlipidemia and she does carry that diagnosis.  Unfortunately as previously mentioned she did not respond to PCSK9 inhibitor.  She did proceed with genetic testing after genetic counseling by Dr. Lattie Corns. 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  This finding is a pathogenic variant that does not allow the LDL receptor to fold properly and it is therefore poorly trafficked out of the golgi apparatus and rarely is expressed on the surface of the liver.  Based on this finding, it is understandable why a PCSK9 inhibitor which works by inhibiting the PCSK9 co-protein associated with surface LDL receptors would be ineffective as the total number of surface LDL receptors are extremely low.  We further discussed this mechanism and other strategies which may be helpful to lower her cholesterol.  She was again tried on pravastatin by the advanced heart failure clinic pharmacist, however had significant myalgias with this and may have some myopathy.  She says today that she is just now being able to straighten out the fingers on her hands.  At this point there are few additional options although she remains at high risk of recurrent events.  Her most recent cholesterol from several months ago showed total cholesterol 305, triglycerides 202, HDL 47 and LDL-C of 218.  Remaining options for her include LDL apheresis, for which she is likely a good candidate however is associated with significant cost and is cumbersome and would require her  to likely travel several times a month to either [facility] or [place].  The other option would be lomitapide.  By mechanism of inhibiting MTP, lomitapide should be effective at lowering her overall lipid profile by 30 to 40%, but is associated with significant GI side effects and liver toxicity.  This will need to be monitored closely and requires prescription by a provider who has completed the REMS  program (which I am certified).  02/12/2018  [name] returns today for follow-up.  We have been working very diligently over the past several months to try to get her approval for lomitapide.  After multiple appeal she has finally been approved.  She indicates that she may be able to get this for no co-pay.  We will again need to repeat a lipid profile and liver enzymes as a baseline since is been 3 months since her last study and she would anticipate starting the medication next week.  We will need to monitor her liver enzymes very closely.  She is also made significant dietary changes at the direction of the nutritionist for [place] who is been working with her telephonically on the importance of changing her diet.  05/11/2018  [name] returns today for follow-up.  She seems to be tolerating Juxtapid, but does have some issues with joint pain which is been a longstanding problem.  We are adhering to the rems program, and she is having monthly liver enzyme tests.  She has known steatohepatitis and has had elevated liver enzymes in the past.  I am pleased to report her lipids have improved.  Her total cholesterol came down from 379-274 with triglycerides down from 300-200 and LDL from 284 to 224.  Unfortunately her liver enzymes are climbing.  Over the past 6 months her ALT is climbed from 54 to 155 and AST from 34 to 65.  The ALT is now about 5 times upper limit normal.  I discussed this with her at this point and concerned that she may not be able to tolerate the medication.  Guidelines for the manufacturer suggest decreasing the dose however she is on the lowest dose.  I discussed this further with our pharmacist Tana Coast, Pharm.D., who reviewed the pharmacokinetics of the drug and given its longer half-life of close to 48 hours felt that the drug could be effectively dosed every other day and perhaps still maintain steady state while reducing the possibility of side effects.  In addition, [name] suffers  from arthritic pain and recently has had an upper respiratory infection which worsened her symptoms.  She has been taking Tylenol fairly regularly for this since she was once told not to take nonsteroidals due to her heart disease.  She did not realize that the Tylenol could worsen her liver enzyme abnormalities.  01/19/2019  [name] is seen today in follow-up.  Overall she seems to be doing quite well.  Although we had been maintaining her on Juxtapid, I was disappointed in the minimal reduction in her lipids.  This was at the trade off of elevated liver enzymes.  Initially there was some concern about coadministration of Tylenol which we discontinued and it did improve her numbers however the liver enzymes did not allow [place] to uptitrate the medication.  Her cholesterol really did not get a meaningful reduction.  Previously it was noted that she had had some benefit although not as much as expected with a PCSK9 inhibitor.  I recommended that we restart Repatha to see if that would be of  benefit.  In addition with the recent introduction of Nexletol, I felt this could also add to lipid lowering.  It is well-tolerated and since she has had statin side effects I felt that would be appropriate for her.  I am pleased to report she has had a significant reduction in her lipid profile.  Her LDL cholesterol is reduced from 222 down to 26.  Total cholesterol has come down to 100 from 277.  HDL is increased from 30-38 and there has been a small increase in triglycerides from 126-181.  Some of this may be dietary and related to decreased exercise that she cannot participate in cardiac rehab at this time and they are moving to [facility] therefore have been eating out more.  She had reported some cramping in her hands which is also resolved off of Juxtapid.  07/04/2019  [name] is seen today in follow-up.  Unfortunately she contracted Covid in late December.  She says she was sick for about 2 weeks.  She is also fallen off  of her diet and has not been doing exercise.  Cardiac rehabilitation was closed.  She recently moved and her new apartment complex has a gym.  She plans to get back to that.  This seems to track with an increase in her lipids.  She remains on Repatha.  She will need a renewal of her prescription.  When evaluating her triglycerides, they have increased steadily over the past year from 74-126 -181-212.  This also tracks a weight gain from 130 to 142 pounds.  Lipids recently were 122 total cholesterol, triglycerides 212, HDL 38 and LDL 50.  01/12/2020  [name] is seen today for follow-up.  Overall her lipids have been fairly stable.  Cholesterol most recently was 129, triglycerides 222, HDL 35 and LDL 58.  She is tolerating triple therapy on Repatha, Nexletol and ezetimibe.  Liver enzymes have been normal.  She reports some unusual left upper quadrant pain which she thought initially was her liver however I remind her the liver was on the right upper quadrant.  In addition she is describing some upper neck and lower occipital pain.  She wonders if this might be related to restarting her exercise.  These episodes may last for couple hours.  She has not tried nitroglycerin for it and did not feel that it was her heart although it had some similar features to prior to her previous cardiac episodes.  02/22/2021  [name] returns today for follow-up.  I last saw her in August 2021.  Her cholesterol has still remained lower however seems to be climbing.  This is primarily dietary.  LDL now 69 up from 58.  Triglycerides are higher at 313, up from 222.  She reports compliance with the Repatha, bempedoic acid and ezetimibe.  She also reports a decrease in physical activity which may be contributing to this.  She continues to be asymptomatic from a cardiac standpoint.  PMHx:  Past Medical History:  Diagnosis Date   Anemia    due to heavy periods   Anxiety    Coronary artery disease    Difficult intubation 01/22/2016    Headache    Heart murmur    aschild antibiotics if having Dental work   Hypocholesteremia    medical treatment   Ischemic cardiomyopathy    Menorrhagia    NSTEMI (non-ST elevated myocardial infarction) ([place])    Pelvic pain     Past Surgical History:  Procedure Laterality Date   ABDOMINAL HYSTERECTOMY  CORONARY ARTERY BYPASS GRAFT N/A 07/12/2016   Procedure: CORONARY ARTERY BYPASS GRAFTING (CABG), ON PUMP, TIMES ONE, USING LEFT INTERNAL MAMMARY ARTERY WITH TEE;  Surgeon: Gaye Pollack, MD;  Location: [facility];  Service: Open Heart Surgery;  Laterality: N/A;  LIMA to LAD   goiter     left side goiter removed   INGUINAL HERNIA REPAIR Bilateral 01/22/2016   Procedure: LAPAROSCOPIC BILATERAL FEMORAL AND RIGHT INGUINAL HERNIA REPAIRWITH INSERTION OF MESH;  Surgeon: Michael Boston, MD;  Location: [facility];  Service: General;  Laterality: Bilateral;   LEFT HEART CATH AND CORONARY ANGIOGRAPHY N/A 07/12/2016   Procedure: Left Heart Cath and Coronary Angiography;  Surgeon: Lorretta Harp, MD;  Location: [facility];  Service: Cardiovascular;  Laterality: N/A;   LEFT HEART CATH AND CORS/GRAFTS ANGIOGRAPHY N/A 03/08/2020   Procedure: LEFT HEART CATH AND CORS/GRAFTS ANGIOGRAPHY;  Surgeon: Lorretta Harp, MD;  Location: [facility];  Service: Cardiovascular;  Laterality: N/A;   SVD     x 1   WISDOM TOOTH EXTRACTION      FAMHx:  Family History  Adopted: Yes    SOCHx:   reports that she has never smoked. She has never used smokeless tobacco. She reports that she does not drink alcohol and does not use drugs.  ALLERGIES:  Allergies  Allergen Reactions   Statins Other (See Comments)    Muscle aches with higher doses of: Crestor & Lipitor   Bactrim Itching and Rash         ROS: Pertinent items noted in HPI and remainder of comprehensive ROS otherwise negative.  HOME MEDS: Current Outpatient Medications on File Prior to Visit  Medication Sig Dispense Refill   aspirin  81 MG tablet Take 1 tablet (81 mg total) by mouth daily.     Coenzyme Q10 (COQ10) 200 MG CAPS Take 200 mg by mouth at bedtime.      Evolocumab (REPATHA SURECLICK) 956 MG/ML SOAJ INJECT THE CONTENTS OF ONE PEN UNDER THE SKIN EVERY 14 DAYS 6 mL 3   ezetimibe (ZETIA) 10 MG tablet TAKE 1 TABLET BY MOUTH EVERY DAY 90 tablet 3   isosorbide mononitrate (IMDUR) 30 MG 24 hr tablet TAKE 1/2 TABLET(15 MG) BY MOUTH DAILY 45 tablet 2   losartan (COZAAR) 25 MG tablet TAKE 1 TABLET BY MOUTH EVERY DAY 90 tablet 3   losartan (COZAAR) 25 MG tablet Take 1 tablet by mouth every evening.     metoprolol succinate (TOPROL-XL) 25 MG 24 hr tablet TAKE 1 TABLET(25 MG) BY MOUTH DAILY 90 tablet 2   NEXLETOL 180 MG TABS TAKE 1 TABLET BY MOUTH EVERY DAY 90 tablet 3   nitroGLYCERIN (NITROSTAT) 0.4 MG SL tablet Place 1 tablet (0.4 mg total) under the tongue every 5 (five) minutes x 3 doses as needed for chest pain. 30 tablet 6   Omega-3 Fatty Acids (OMEGA-3 PO) Take 1 capsule by mouth daily with breakfast. 369     spironolactone (ALDACTONE) 25 MG tablet TAKE 1 TABLET BY MOUTH DAILY 90 tablet 3   No current facility-administered medications on file prior to visit.    LABS/IMAGING: No results found for this or any previous visit (from the past 48 hour(s)). No results found.  LIPID PANEL:    Component Value Date/Time   CHOL 152 02/13/2021 0855   TRIG 313 (H) 02/13/2021 0855   HDL 33 (L) 02/13/2021 0855   CHOLHDL 4.6 (H) 02/13/2021 0855   CHOLHDL 7.8 09/02/2018 0449  VLDL 15 09/02/2018 0449   LDLCALC 69 02/13/2021 0855     WEIGHTS: Wt Readings from Last 3 Encounters:  02/22/21 132 lb (59.9 kg)  03/28/20 136 lb 3.2 oz (61.8 kg)  03/08/20 136 lb (61.7 kg)    VITALS: BP 126/74   [name] (!) 59   Ht 5' (1.524 m)   Wt 132 lb (59.9 kg)   LMP 08/10/2011   SpO2 99%   BMI 25.78 kg/m   EXAM: General appearance: alert and no distress Lungs: clear to auscultation bilaterally Heart: regular rate and  rhythm Extremities: extremities normal, atraumatic, no cyanosis or edema Neurologic: Grossly normal  EKG: Deferred  ASSESSMENT: Coronary artery disease status post single-vessel CABG to the LAD Ischemic cardiomyopathy, EF 40-45% HeFH (heterozygous for a change in LDLR (c.1103G>A, p.Cys368Tyr) Documented statin intolerance Hepatic steatosis-elevated liver enzymes  PLAN: 1.   [name] is asymptomatic however her cholesterol is rising.  She is compliant with her therapy and needs to try to further optimize diet and increase exercise.  We will plan 6 months to achieve this and repeat lipid profile as well as an LP(a) at that time.  She does have a documented mutation in the LDL receptor which is diagnostic for heterozygous familial hyperlipidemia.  Follow-up with me in 6 months or sooner as necessary.  Pixie Casino, MD, [facility], [place] Director of the Advanced Lipid Disorders &  Cardiovascular Risk Reduction Clinic Attending Cardiologist  Direct Dial: [phone]  Fax: [phone]  Website:  [email or web address] 02/22/2021, 9:31 AM

## 2021-03-04 ENCOUNTER — Other Ambulatory Visit: Payer: Self-pay | Admitting: Cardiovascular Disease

## 2021-05-31 ENCOUNTER — Ambulatory Visit: Payer: Federal, State, Local not specified - PPO | Admitting: Cardiovascular Disease

## 2021-05-31 ENCOUNTER — Other Ambulatory Visit: Payer: Self-pay

## 2021-05-31 ENCOUNTER — Encounter: Payer: Self-pay | Admitting: Cardiovascular Disease

## 2021-05-31 VITALS — BP 110/64 | HR 54 | Ht 59.0 in | Wt 134.0 lb

## 2021-05-31 DIAGNOSIS — E7849 Other hyperlipidemia: Secondary | ICD-10-CM | POA: Diagnosis not present

## 2021-05-31 DIAGNOSIS — I255 Ischemic cardiomyopathy: Secondary | ICD-10-CM | POA: Diagnosis not present

## 2021-05-31 DIAGNOSIS — I10 Essential (primary) hypertension: Secondary | ICD-10-CM

## 2021-05-31 DIAGNOSIS — Z951 Presence of aortocoronary bypass graft: Secondary | ICD-10-CM | POA: Diagnosis not present

## 2021-05-31 NOTE — Patient Instructions (Signed)
Medication Instructions:  Your physician recommends that you continue on your current medications as directed. Please refer to the Current Medication list given to you today.  *If you need a refill on your cardiac medications before your next appointment, please call your pharmacy*   Testing/Procedures: Your physician has requested that you have an echocardiogram. Echocardiography is a painless test that uses sound waves to create images of your heart. It provides your doctor with information about the size and shape of your heart and how well your hearts chambers and valves are working. This procedure takes approximately one hour. There are no restrictions for this procedure. This procedure is done at [address].    Follow-Up: At [facility], you and your health needs are our priority.  As part of our continuing mission to provide you with exceptional heart care, we have created designated Provider Care Teams.  These Care Teams include your primary Cardiologist (physician) and Advanced Practice Providers (APPs -  Physician Assistants and Nurse Practitioners) who all work together to provide you with the care you need, when you need it.  We recommend signing up for the patient portal called "MyChart".  Sign up information is provided on this After Visit Summary.  MyChart is used to connect with patients for Virtual Visits (Telemedicine).  Patients are able to view lab/test results, encounter notes, upcoming appointments, etc.  Non-urgent messages can be sent to your provider as well.   To learn more about what you can do with MyChart, go to [email or web address].    Your next appointment:   12 month(s)  The format for your next appointment:   In Person  Provider:   Quay Burow, MD

## 2021-05-31 NOTE — Assessment & Plan Note (Signed)
History of familial hyperlipidemia followed closely by Dr. [name] in lipid clinic with her most recent lipid profile performed 02/13/2021 revealing total cholesterol 152, LDL 69 HDL of 33.  She is on Zetia and Repatha.

## 2021-05-31 NOTE — Assessment & Plan Note (Signed)
History of essential hypertension a blood pressure measured today at 110/64.  She is on metoprolol and losartan.

## 2021-05-31 NOTE — Assessment & Plan Note (Signed)
History of CAD status post non-STEMI 07/12/2016 with cath that revealed a true ostial 99% LAD lesion with anteroapical wall motion abnormality.  She underwent urgent coronary artery bypass grafting x1 with a LIMA to her LAD and she did well since that time.  Her EF has remained in the 45 to 50% range.  I did do a heart cath on her 03/08/2020 after a Myoview on 02/28/2020 showed anteroapical scar with peri-infarct ischemia.  This revealed an occluded ostial LAD with a patent LIMA and otherwise insignificant CAD.  She denies chest pain or shortness of breath.

## 2021-05-31 NOTE — Assessment & Plan Note (Signed)
History of ischemic cardiomyopathy with an EF in the 45 to 50% range on appropriate guideline directed optimal medical therapy.

## 2021-05-31 NOTE — Progress Notes (Signed)
05/31/2021 [name]   16-Jul-1970  [id number]  Primary Physician Lawerance Cruel, MD Primary Cardiologist: Lorretta Harp MD Lupe Carney, [place]  HPI:  [name] is a 51 y.o.  mildly overweight married Caucasian female mother of one 49 year old daughter.  I last saw her  in the office 03/28/2020. She underwent coronary artery bypass grafting times one emergently by Dr. Cyndia Bent on 07/12/16. The remainder of her coronary arteries were unremarkable. She did have a non-STEMI on that day with progressively rising troponins and EKG changes. She had a 99% ostial LAD with anteroapical wall motion abnormality. She is recuperating nicely. She is about to start cardiac rehabilitation. She denies chest pain or shortness of breath. She does complain of some cramping in her legs which may be statin-related. Her last echo performed 10/21/16 revealed ejection fraction of 40-45% with wall motion and a all motion abnormality in the LAD distribution. She was begun on carvedilol which apparently she did not tolerate. She was also placed on Repatha which did not affect her lipid profile as well as Praluent. She was referred to Dr. Hessie Knows for genetic testing for presumed FH. Repeat 2-D echo performed 05/06/17 revealed an EF of 40-45% with anteroapical hypokinesia.   I did refer her to Dr. [name] for advanced lipid evaluation therapy.  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  She was unresponsive to statin therapy and PCSK9.  She is now being tried on Juxtapid.  I also sent her to Dr. [name] in the advanced heart failure clinic.  She is on optimal medical therapy for this.  She is now NYHA class I-2 is relatively asymptomatic with an EF of 45 to 50%.     She was admitted 09/01/2018 for 1 day.  She ruled out for myocardial infarction.  She had no acute EKG changes.  A 2D echo was performed that showed an EF in the 35 to 40% range with apical aneurysm and inferior hypokinesia.  She was seen by Dr. Harrington Challenger and  sent home for noncardiac chest pain.  Since being at home she is been asymptomatic.  She was taken off her beta-blocker because of bradycardia on telemetry although now she complains of tachycardia.    I did place her back on a beta-blocker   She has been seeing Dr. [name] for lipid management with incredible results.  She is complained of some fatigue, dyspnea on exertion since she is been going back to the gym and some back pain similar to her infarct pain.  She had a 2D echo performed 02/28/2020 that showed an EF in the 45 to 50% range and a Myoview stress test performed the following day that showed anteroapical scar with peri-infarct ischemia.  Based on this I performed diagnostic coronary angiography 03/08/2020 revealing an occluded ostial LAD, patent LIMA to the LAD and otherwise no significant CAD.    Since I saw her a year ago she is remained stable.  She continues to follow with Dr. [name] for her lipid management with excellent results.  She is active and denies chest pain or shortness of breath.  Current Meds  Medication Sig   aspirin 81 MG tablet Take 1 tablet (81 mg total) by mouth daily.   Coenzyme Q10 (COQ10) 200 MG CAPS Take 200 mg by mouth at bedtime.    Evolocumab (REPATHA SURECLICK) 854 MG/ML SOAJ INJECT THE CONTENTS OF ONE PEN UNDER THE SKIN EVERY 14 DAYS   ezetimibe (ZETIA) 10 MG tablet  TAKE 1 TABLET BY MOUTH EVERY DAY   isosorbide mononitrate (IMDUR) 30 MG 24 hr tablet TAKE 1/2 TABLET(15 MG) BY MOUTH DAILY   losartan (COZAAR) 25 MG tablet TAKE 1 TABLET BY MOUTH EVERY DAY   losartan (COZAAR) 25 MG tablet Take 1 tablet by mouth every evening.   metoprolol succinate (TOPROL-XL) 25 MG 24 hr tablet TAKE ONE TABLET BY MOUTH ONE TIME DAILY   NEXLETOL 180 MG TABS TAKE 1 TABLET BY MOUTH EVERY DAY   nitroGLYCERIN (NITROSTAT) 0.4 MG SL tablet Place 1 tablet (0.4 mg total) under the tongue every 5 (five) minutes x 3 doses as needed for chest pain.   Omega-3 Fatty Acids (OMEGA-3 PO) Take 1  capsule by mouth daily with breakfast. 369   spironolactone (ALDACTONE) 25 MG tablet TAKE 1 TABLET BY MOUTH DAILY     Allergies  Allergen Reactions   Statins Other (See Comments)    Muscle aches with higher doses of: Crestor & Lipitor   Bactrim Itching and Rash         Social History   Socioeconomic History   Marital status: Married    Spouse name: Not on file   Number of children: Not on file   Years of education: Not on file   Highest education level: Not on file  Occupational History   Not on file  Tobacco Use   Smoking status: Never   Smokeless tobacco: Never  Substance and Sexual Activity   Alcohol use: No   Drug use: No   Sexual activity: Yes    Birth control/protection: None  Other Topics Concern   Not on file  Social History Narrative   Not on file   Social Determinants of Health   Financial Resource Strain: Not on file  Food Insecurity: Not on file  Transportation Needs: Not on file  Physical Activity: Not on file  Stress: Not on file  Social Connections: Not on file  Intimate Partner Violence: Not on file     Review of Systems: General: negative for chills, fever, night sweats or weight changes.  Cardiovascular: negative for chest pain, dyspnea on exertion, edema, orthopnea, palpitations, paroxysmal nocturnal dyspnea or shortness of breath Dermatological: negative for rash Respiratory: negative for cough or wheezing Urologic: negative for hematuria Abdominal: negative for nausea, vomiting, diarrhea, bright red blood per rectum, melena, or hematemesis Neurologic: negative for visual changes, syncope, or dizziness All other systems reviewed and are otherwise negative except as noted above.    Blood pressure 110/64, pulse (!) 54, height 4\' 11"  (1.499 m), weight 134 lb (60.8 kg), last menstrual period 08/10/2011.  General appearance: alert and no distress Neck: no adenopathy, no carotid bruit, no JVD, supple, symmetrical, trachea midline, and thyroid  not enlarged, symmetric, no tenderness/mass/nodules Lungs: clear to auscultation bilaterally Heart: Soft outflow tract murmur Extremities: extremities normal, atraumatic, no cyanosis or edema Pulses: 2+ and symmetric Skin: Skin color, texture, turgor normal. No rashes or lesions Neurologic: Grossly normal  EKG sinus bradycardia 54 with poor R wave progression consistent with old anterior infarct.  I personally reviewed this EKG.  ASSESSMENT AND PLAN:   S/P CABG x 1 History of CAD status post non-STEMI 07/12/2016 with cath that revealed a true ostial 99% LAD lesion with anteroapical wall motion abnormality.  She underwent urgent coronary artery bypass grafting x1 with a LIMA to her LAD and she did well since that time.  Her EF has remained in the 45 to 50% range.  I did do a  heart cath on her 03/08/2020 after a Myoview on 02/28/2020 showed anteroapical scar with peri-infarct ischemia.  This revealed an occluded ostial LAD with a patent LIMA and otherwise insignificant CAD.  She denies chest pain or shortness of breath.  Familial hyperlipidemia History of familial hyperlipidemia followed closely by Dr. [name] in lipid clinic with her most recent lipid profile performed 02/13/2021 revealing total cholesterol 152, LDL 69 HDL of 33.  She is on Zetia and Repatha.  Ischemic cardiomyopathy History of ischemic cardiomyopathy with an EF in the 45 to 50% range on appropriate guideline directed optimal medical therapy.  Hypertension History of essential hypertension a blood pressure measured today at 110/64.  She is on metoprolol and losartan.     Lorretta Harp MD FACP,FACC,FAHA, [facility] 05/31/2021 10:36 AM

## 2021-06-02 ENCOUNTER — Other Ambulatory Visit: Payer: Self-pay | Admitting: Cardiovascular Disease

## 2021-06-13 ENCOUNTER — Other Ambulatory Visit: Payer: Self-pay

## 2021-06-13 ENCOUNTER — Ambulatory Visit (HOSPITAL_COMMUNITY): Payer: Federal, State, Local not specified - PPO | Attending: Cardiology

## 2021-06-13 DIAGNOSIS — E7849 Other hyperlipidemia: Secondary | ICD-10-CM | POA: Insufficient documentation

## 2021-06-13 DIAGNOSIS — Z951 Presence of aortocoronary bypass graft: Secondary | ICD-10-CM | POA: Insufficient documentation

## 2021-06-13 DIAGNOSIS — I10 Essential (primary) hypertension: Secondary | ICD-10-CM | POA: Insufficient documentation

## 2021-06-13 DIAGNOSIS — I255 Ischemic cardiomyopathy: Secondary | ICD-10-CM | POA: Insufficient documentation

## 2021-06-13 LAB — ECHOCARDIOGRAM COMPLETE
Area-P 1/2: 3.91 cm2
S' Lateral: 3.1 cm

## 2021-06-13 MED ORDER — PERFLUTREN LIPID MICROSPHERE
1.0000 mL | INTRAVENOUS | Status: AC | PRN
  Administered 2021-06-13: 1 mL via INTRAVENOUS

## 2021-07-03 ENCOUNTER — Other Ambulatory Visit: Payer: Self-pay | Admitting: Cardiovascular Disease

## 2021-07-28 ENCOUNTER — Other Ambulatory Visit: Payer: Self-pay | Admitting: Cardiovascular Disease

## 2021-08-20 DIAGNOSIS — E785 Hyperlipidemia, unspecified: Secondary | ICD-10-CM | POA: Diagnosis not present

## 2021-08-21 LAB — HEPATIC FUNCTION PANEL
ALT: 39 IU/L — ABNORMAL HIGH (ref 0–32)
AST: 43 IU/L — ABNORMAL HIGH (ref 0–40)
Albumin: 4.7 g/dL (ref 3.8–4.9)
Alkaline Phosphatase: 61 IU/L (ref 44–121)
Bilirubin Total: 0.5 mg/dL (ref 0.0–1.2)
Bilirubin, Direct: 0.15 mg/dL (ref 0.00–0.40)
Total Protein: 7.4 g/dL (ref 6.0–8.5)

## 2021-08-21 LAB — LIPID PANEL
Chol/HDL Ratio: 3.5 ratio (ref 0.0–4.4)
Cholesterol, Total: 135 mg/dL (ref 100–199)
HDL: 39 mg/dL — ABNORMAL LOW (ref >39)
LDL Chol Calc (NIH): 66 mg/dL (ref 0–99)
Triglycerides: 175 mg/dL — ABNORMAL HIGH (ref 0–149)
VLDL Cholesterol Cal: 30 mg/dL (ref 5–40)

## 2021-08-21 LAB — LIPOPROTEIN A (LPA): Lipoprotein (a): 108.3 nmol/L — ABNORMAL HIGH (ref <75.0)

## 2021-08-23 ENCOUNTER — Encounter: Payer: Self-pay | Admitting: Internal Medicine

## 2021-08-23 ENCOUNTER — Ambulatory Visit: Payer: Federal, State, Local not specified - PPO | Admitting: Internal Medicine

## 2021-08-23 VITALS — BP 115/65 | HR 58 | Ht 60.0 in | Wt 131.8 lb

## 2021-08-23 DIAGNOSIS — T466X5D Adverse effect of antihyperlipidemic and antiarteriosclerotic drugs, subsequent encounter: Secondary | ICD-10-CM

## 2021-08-23 DIAGNOSIS — T466X5A Adverse effect of antihyperlipidemic and antiarteriosclerotic drugs, initial encounter: Secondary | ICD-10-CM

## 2021-08-23 DIAGNOSIS — Z951 Presence of aortocoronary bypass graft: Secondary | ICD-10-CM | POA: Diagnosis not present

## 2021-08-23 DIAGNOSIS — R748 Abnormal levels of other serum enzymes: Secondary | ICD-10-CM

## 2021-08-23 DIAGNOSIS — E7841 Elevated Lipoprotein(a): Secondary | ICD-10-CM

## 2021-08-23 DIAGNOSIS — E7849 Other hyperlipidemia: Secondary | ICD-10-CM

## 2021-08-23 DIAGNOSIS — M791 Myalgia, unspecified site: Secondary | ICD-10-CM

## 2021-08-23 NOTE — Progress Notes (Signed)
?  ? ?OFFICE NOTE ? ?Chief Complaint:  ?Follow-up dyslipidemia ? ?Primary Care Physician: ?Lawerance Cruel, MD ? ?HPI:  ?[name] is a 51 y.o. female with a past medial history significant for emergent single-vessel coronary artery bypass grafting with a 99% ostial LAD stenosis in February 2018.  She was not found to have any other significant obstructive coronary disease at the time.  Echo in May 2018 revealed an LVEF of 40-45% and an LAD wall motion abnormality.  He is a patient of Dr. [name].  She has an extensive history of dyslipidemia and was told by her uncle when she was a child in [place] that her cholesterol was very high and that she would likely need to be on treatment.  She is adopted and does not know her birth parents, but does have a daughter who is 79 years old.  She has not had cholesterol testing to her knowledge.  She had been started on statins, but reports significant intolerance, this is to high-dose atorvastatin, rosuvastatin, and possibly others that she cannot recall.  She was then referred to start [place].  Her lipid profile as of July 2018 showed total cholesterol 246, triglycerides 237, HDL 35 and LDL 164.  After starting therapy on Repatha, she received 4 doses, approximately 2 months of therapy and her repeat lipid profile showed a total cholesterol 265, triglycerides 220, LDL 176 and HDL of 45.  This is suggesting that she has had no response to the PCS canine inhibitor.  She reports compliance with the medication.  She had no side effects from it.  Possible reasons for this include a PCSK9 gain of function mutation or LDL receptor mutation.  Findings are possibly suggestive of familial hyperlipidemia although her LDL is moderately elevated.  Initially the plan was to consider switching her to Praluent and she has been provided samples, however, it is likely that she will have a similar poor response. ? ?10/30/2017 ? ?Mrs. Younts returns today for follow-up of dyslipidemia.  There  is clinical concern for familial hyperlipidemia and she does carry that diagnosis.  Unfortunately as previously mentioned she did not respond to PCSK9 inhibitor.  She did proceed with genetic testing after genetic counseling by Dr. Lattie Corns. 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  This finding is a pathogenic variant that does not allow the LDL receptor to fold properly and it is therefore poorly trafficked out of the golgi apparatus and rarely is expressed on the surface of the liver.  Based on this finding, it is understandable why a PCSK9 inhibitor which works by inhibiting the PCSK9 co-protein associated with surface LDL receptors would be ineffective as the total number of surface LDL receptors are extremely low.  We further discussed this mechanism and other strategies which may be helpful to lower her cholesterol.  She was again tried on pravastatin by the advanced heart failure clinic pharmacist, however had significant myalgias with this and may have some myopathy.  She says today that she is just now being able to straighten out the fingers on her hands.  At this point there are few additional options although she remains at high risk of recurrent events.  Her most recent cholesterol from several months ago showed total cholesterol 305, triglycerides 202, HDL 47 and LDL-C of 218.  Remaining options for her include LDL apheresis, for which she is likely a good candidate however is associated with significant cost and is cumbersome and would require her  to likely travel several times a month to either [facility] or [place].  The other option would be lomitapide.  By mechanism of inhibiting MTP, lomitapide should be effective at lowering her overall lipid profile by 30 to 40%, but is associated with significant GI side effects and liver toxicity.  This will need to be monitored closely and requires prescription by a provider who has completed the REMS  program (which I am certified). ? ?02/12/2018 ? ?Haani returns today for follow-up.  We have been working very diligently over the past several months to try to get her approval for lomitapide.  After multiple appeal she has finally been approved.  She indicates that she may be able to get this for no co-pay.  We will again need to repeat a lipid profile and liver enzymes as a baseline since is been 3 months since her last study and she would anticipate starting the medication next week.  We will need to monitor her liver enzymes very closely.  She is also made significant dietary changes at the direction of the nutritionist for [place] who is been working with her telephonically on the importance of changing her diet. ? ?05/11/2018 ? ?Chiana returns today for follow-up.  She seems to be tolerating Juxtapid, but does have some issues with joint pain which is been a longstanding problem.  We are adhering to the rems program, and she is having monthly liver enzyme tests.  She has known steatohepatitis and has had elevated liver enzymes in the past.  I am pleased to report her lipids have improved.  Her total cholesterol came down from 379-274 with triglycerides down from 300-200 and LDL from 284 to 224.  Unfortunately her liver enzymes are climbing.  Over the past 6 months her ALT is climbed from 54 to 155 and AST from 34 to 65.  The ALT is now about 5 times upper limit normal.  I discussed this with her at this point and concerned that she may not be able to tolerate the medication.  Guidelines for the manufacturer suggest decreasing the dose however she is on the lowest dose.  I discussed this further with our pharmacist Tana Coast, Pharm.D., who reviewed the pharmacokinetics of the drug and given its longer half-life of close to 48 hours felt that the drug could be effectively dosed every other day and perhaps still maintain steady state while reducing the possibility of side effects.  In addition, [name] suffers  from arthritic pain and recently has had an upper respiratory infection which worsened her symptoms.  She has been taking Tylenol fairly regularly for this since she was once told not to take nonsteroidals due to her heart disease.  She did not realize that the Tylenol could worsen her liver enzyme abnormalities. ? ?01/19/2019 ? ?Larie is seen today in follow-up.  Overall she seems to be doing quite well.  Although we had been maintaining her on Juxtapid, I was disappointed in the minimal reduction in her lipids.  This was at the trade off of elevated liver enzymes.  Initially there was some concern about coadministration of Tylenol which we discontinued and it did improve her numbers however the liver enzymes did not allow [place] to uptitrate the medication.  Her cholesterol really did not get a meaningful reduction.  Previously it was noted that she had had some benefit although not as much as expected with a PCSK9 inhibitor.  I recommended that we restart Repatha to see if that would be of  benefit.  In addition with the recent introduction of Nexletol, I felt this could also add to lipid lowering.  It is well-tolerated and since she has had statin side effects I felt that would be appropriate for her.  I am pleased to report she has had a significant reduction in her lipid profile.  Her LDL cholesterol is reduced from 222 down to 26.  Total cholesterol has come down to 100 from 277.  HDL is increased from 30-38 and there has been a small increase in triglycerides from 126-181.  Some of this may be dietary and related to decreased exercise that she cannot participate in cardiac rehab at this time and they are moving to [facility] therefore have been eating out more.  She had reported some cramping in her hands which is also resolved off of Juxtapid. ? ?07/04/2019 ? ?Laura is seen today in follow-up.  Unfortunately she contracted Covid in late December.  She says she was sick for about 2 weeks.  She is also fallen off  of her diet and has not been doing exercise.  Cardiac rehabilitation was closed.  She recently moved and her new apartment complex has a gym.  She plans to get back to that.  This seems to track with an

## 2021-08-23 NOTE — Patient Instructions (Signed)
Medication Instructions:  ?Your physician recommends that you continue on your current medications as directed. Please refer to the Current Medication list given to you today. ? ?*If you need a refill on your cardiac medications before your next appointment, please call your pharmacy* ? ? ?Lab Work: ?FASTING lab work in 1 year to check cholesterol & liver function  ? ?If you have labs (blood work) drawn today and your tests are completely normal, you will receive your results only by: ?MyChart Message (if you have MyChart) OR ?A paper copy in the mail ?If you have any lab test that is abnormal or we need to change your treatment, we will call you to review the results. ? ? ?Testing/Procedures: ?NONE ? ? ?Follow-Up: ?At [facility], you and your health needs are our priority.  As part of our continuing mission to provide you with exceptional heart care, we have created designated Provider Care Teams.  These Care Teams include your primary Cardiologist (physician) and Advanced Practice Providers (APPs -  Physician Assistants and Nurse Practitioners) who all work together to provide you with the care you need, when you need it. ? ?We recommend signing up for the patient portal called "MyChart".  Sign up information is provided on this After Visit Summary.  MyChart is used to connect with patients for Virtual Visits (Telemedicine).  Patients are able to view lab/test results, encounter notes, upcoming appointments, etc.  Non-urgent messages can be sent to your provider as well.   ?To learn more about what you can do with MyChart, go to [email or web address].   ? ?Your next appointment:   ?12 month(s) ? ?The format for your next appointment:   ?In Person ? ?Provider:   ?Dr. Lyman Bishop  ? ?

## 2021-08-31 ENCOUNTER — Other Ambulatory Visit: Payer: Self-pay | Admitting: Cardiovascular Disease

## 2021-09-16 ENCOUNTER — Telehealth: Payer: Self-pay | Admitting: Internal Medicine

## 2021-09-16 NOTE — Telephone Encounter (Signed)
PA for repatha sureclick submitted via fax to [facility] FEP at [phone] ?

## 2021-09-19 NOTE — Telephone Encounter (Signed)
Patient approved for Repatha from 08/19/21 -- 09/18/22 ?

## 2021-09-22 ENCOUNTER — Other Ambulatory Visit: Payer: Self-pay | Admitting: Cardiovascular Disease

## 2021-10-26 ENCOUNTER — Other Ambulatory Visit: Payer: Self-pay | Admitting: Cardiovascular Disease

## 2021-11-28 ENCOUNTER — Encounter: Payer: Self-pay | Admitting: Internal Medicine

## 2021-11-28 NOTE — Telephone Encounter (Signed)
Nexletol submitted via phone to [facility] [phone] Denied as patient is on PCKS9i also  Appeals letter composed Patient to provide member representation letter Will fax w/MD note to [phone]

## 2021-12-05 NOTE — Telephone Encounter (Signed)
Late entry:  7/12: Received direct call from operator that peer to peer needed to be completed for nexletol - deadline 7/12. Asked this rep be transferred to reviewer directly but they were unavailable. Was also notified they called our office and left a message for a nurse but there is no documentation of this. Never received call from insurance company  7/13: [place] to inquire about this. Was notified again they have tried to reach our office 3x but explained that only 1 time was known, yesterday 7/12 AM. Informed rep I need to get this resolved today as this RN is OOO for a while. Provided direct phone # to triage desk for call back.

## 2021-12-11 ENCOUNTER — Telehealth: Payer: Self-pay

## 2021-12-11 NOTE — Telephone Encounter (Signed)
Nexletol Auth approval for 11/06/2021 to 12/06/2022.

## 2021-12-27 NOTE — Telephone Encounter (Signed)
See previous notes from 07/19 for approved and 07/25 when the patient know the notices.

## 2022-01-24 ENCOUNTER — Other Ambulatory Visit: Payer: Self-pay | Admitting: Cardiovascular Disease

## 2022-01-24 ENCOUNTER — Other Ambulatory Visit: Payer: Self-pay | Admitting: Internal Medicine

## 2022-02-27 ENCOUNTER — Other Ambulatory Visit: Payer: Self-pay | Admitting: Cardiovascular Disease

## 2022-03-30 ENCOUNTER — Other Ambulatory Visit: Payer: Self-pay | Admitting: Cardiovascular Disease

## 2022-06-01 DIAGNOSIS — M545 Low back pain, unspecified: Secondary | ICD-10-CM | POA: Diagnosis not present

## 2022-06-25 ENCOUNTER — Other Ambulatory Visit: Payer: Self-pay | Admitting: *Deleted

## 2022-06-25 DIAGNOSIS — E7849 Other hyperlipidemia: Secondary | ICD-10-CM

## 2022-08-11 DIAGNOSIS — E7849 Other hyperlipidemia: Secondary | ICD-10-CM | POA: Diagnosis not present

## 2022-08-12 LAB — LIPID PANEL
Chol/HDL Ratio: 4 ratio (ref 0.0–4.4)
Cholesterol, Total: 176 mg/dL (ref 100–199)
HDL: 44 mg/dL (ref >39)
LDL Chol Calc (NIH): 98 mg/dL (ref 0–99)
Triglycerides: 197 mg/dL — ABNORMAL HIGH (ref 0–149)
VLDL Cholesterol Cal: 34 mg/dL (ref 5–40)

## 2022-08-12 LAB — HEPATIC FUNCTION PANEL
ALT: 66 IU/L — ABNORMAL HIGH (ref 0–32)
AST: 69 IU/L — ABNORMAL HIGH (ref 0–40)
Albumin: 5.1 g/dL — ABNORMAL HIGH (ref 3.8–4.9)
Alkaline Phosphatase: 67 IU/L (ref 44–121)
Bilirubin Total: 0.5 mg/dL (ref 0.0–1.2)
Bilirubin, Direct: 0.19 mg/dL (ref 0.00–0.40)
Total Protein: 7.9 g/dL (ref 6.0–8.5)

## 2022-08-25 ENCOUNTER — Other Ambulatory Visit: Payer: Self-pay | Admitting: Cardiovascular Disease

## 2022-08-28 DIAGNOSIS — H1032 Unspecified acute conjunctivitis, left eye: Secondary | ICD-10-CM | POA: Diagnosis not present

## 2022-08-28 DIAGNOSIS — L03213 Periorbital cellulitis: Secondary | ICD-10-CM | POA: Diagnosis not present

## 2022-09-01 ENCOUNTER — Ambulatory Visit: Payer: Federal, State, Local not specified - PPO | Attending: Internal Medicine | Admitting: Internal Medicine

## 2022-09-01 ENCOUNTER — Encounter: Payer: Self-pay | Admitting: Internal Medicine

## 2022-09-01 VITALS — BP 106/64 | HR 83 | Ht 60.0 in | Wt 129.4 lb

## 2022-09-01 DIAGNOSIS — M791 Myalgia, unspecified site: Secondary | ICD-10-CM

## 2022-09-01 DIAGNOSIS — R748 Abnormal levels of other serum enzymes: Secondary | ICD-10-CM | POA: Diagnosis not present

## 2022-09-01 DIAGNOSIS — E7841 Elevated Lipoprotein(a): Secondary | ICD-10-CM | POA: Diagnosis not present

## 2022-09-01 DIAGNOSIS — E7849 Other hyperlipidemia: Secondary | ICD-10-CM

## 2022-09-01 DIAGNOSIS — T466X5D Adverse effect of antihyperlipidemic and antiarteriosclerotic drugs, subsequent encounter: Secondary | ICD-10-CM

## 2022-09-01 DIAGNOSIS — Z951 Presence of aortocoronary bypass graft: Secondary | ICD-10-CM

## 2022-09-01 DIAGNOSIS — T466X5A Adverse effect of antihyperlipidemic and antiarteriosclerotic drugs, initial encounter: Secondary | ICD-10-CM

## 2022-09-01 NOTE — Progress Notes (Signed)
OFFICE NOTE  Chief Complaint:  Follow-up dyslipidemia  Primary Care Physician: Daisy Floro, MD  HPI:  [name] is a 52 y.o. female with a past medial history significant for emergent single-vessel coronary artery bypass grafting with a 99% ostial LAD stenosis in February 2018.  She was not found to have any other significant obstructive coronary disease at the time.  Echo in May 2018 revealed an LVEF of 40-45% and an LAD wall motion abnormality.  He is a patient of Dr. Allyson Sabal.  She has an extensive history of dyslipidemia and was told by her uncle when she was a child in [place] that her cholesterol was very high and that she would likely need to be on treatment.  She is adopted and does not know her birth parents, but does have a daughter who is 77 years old.  She has not had cholesterol testing to her knowledge.  She had been started on statins, but reports significant intolerance, this is to high-dose atorvastatin, rosuvastatin, and possibly others that she cannot recall.  She was then referred to start Repatha.  Her lipid profile as of July 2018 showed total cholesterol 246, triglycerides 237, HDL 35 and LDL 164.  After starting therapy on Repatha, she received 4 doses, approximately 2 months of therapy and her repeat lipid profile showed a total cholesterol 265, triglycerides 220, LDL 176 and HDL of 45.  This is suggesting that she has had no response to the PCS canine inhibitor.  She reports compliance with the medication.  She had no side effects from it.  Possible reasons for this include a PCSK9 gain of function mutation or LDL receptor mutation.  Findings are possibly suggestive of familial hyperlipidemia although her LDL is moderately elevated.  Initially the plan was to consider switching her to Praluent and she has been provided samples, however, it is likely that she will have a similar poor response.  10/30/2017  [name] returns today for follow-up of dyslipidemia.  There  is clinical concern for familial hyperlipidemia and she does carry that diagnosis.  Unfortunately as previously mentioned she did not respond to PCSK9 inhibitor.  She did proceed with genetic testing after genetic counseling by Dr. Sidney Ace. 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  This finding is a pathogenic variant that does not allow the LDL receptor to fold properly and it is therefore poorly trafficked out of the golgi apparatus and rarely is expressed on the surface of the liver.  Based on this finding, it is understandable why a PCSK9 inhibitor which works by inhibiting the PCSK9 co-protein associated with surface LDL receptors would be ineffective as the total number of surface LDL receptors are extremely low.  We further discussed this mechanism and other strategies which may be helpful to lower her cholesterol.  She was again tried on pravastatin by the advanced heart failure clinic pharmacist, however had significant myalgias with this and may have some myopathy.  She says today that she is just now being able to straighten out the fingers on her hands.  At this point there are few additional options although she remains at high risk of recurrent events.  Her most recent cholesterol from several months ago showed total cholesterol 305, triglycerides 202, HDL 47 and LDL-C of 218.  Remaining options for her include LDL apheresis, for which she is likely a good candidate however is associated with significant cost and is cumbersome and would require her to  likely [place].  The other option would be lomitapide.  By mechanism of inhibiting MTP, lomitapide should be effective at lowering her overall lipid profile by 30 to 40%, but is associated with significant GI side effects and liver toxicity.  This will need to be monitored closely and requires prescription by a provider who has completed the REMS  program (which I am certified).  02/12/2018  [name] returns today for follow-up.  We have been working very diligently over the past several months to try to get her approval for lomitapide.  After multiple appeal she has finally been approved.  She indicates that she may be able to get this for no co-pay.  We will again need to repeat a lipid profile and liver enzymes as a baseline since is been 3 months since her last study and she would anticipate starting the medication next week.  We will need to monitor her liver enzymes very closely.  She is also made significant dietary changes at the direction of the nutritionist for Juxtapid who is been working with her telephonically on the importance of changing her diet.  05/11/2018  [name] returns today for follow-up.  She seems to be tolerating Juxtapid, but does have some issues with joint pain which is been a longstanding problem.  We are adhering to the rems program, and she is having monthly liver enzyme tests.  She has known steatohepatitis and has had elevated liver enzymes in the past.  I am pleased to report her lipids have improved.  Her total cholesterol came down from 379-274 with triglycerides down from 300-200 and LDL from 284 to 224.  Unfortunately her liver enzymes are climbing.  Over the past 6 months her ALT is climbed from 54 to 155 and AST from 34 to 65.  The ALT is now about 5 times upper limit normal.  I discussed this with her at this point and concerned that she may not be able to tolerate the medication.  Guidelines for the manufacturer suggest decreasing the dose however she is on the lowest dose.  I discussed this further with our pharmacist Prudence DavidsonKelley Auten, Pharm.D., who reviewed the pharmacokinetics of the drug and given its longer half-life of close to 48 hours felt that the drug could be effectively dosed every other day and perhaps still maintain steady state while reducing the possibility of side effects.  In addition, Olegario MessierKathy suffers  from arthritic pain and recently has had an upper respiratory infection which worsened her symptoms.  She has been taking Tylenol fairly regularly for this since she was once told not to take nonsteroidals due to her heart disease.  She did not realize that the Tylenol could worsen her liver enzyme abnormalities.  01/19/2019  [name] is seen today in follow-up.  Overall she seems to be doing quite well.  Although we had been maintaining her on Juxtapid, I was disappointed in the minimal reduction in her lipids.  This was at the trade off of elevated liver enzymes.  Initially there was some concern about coadministration of Tylenol which we discontinued and it did improve her numbers however the liver enzymes did not allow us to uptitrate the medication.  Her cholesterol really did not get a meaningful reduction.  Previously it was noted that she had had some benefit although not as much as expected with a PCSK9 inhibitor.  I recommended that we restart Repatha to see if that would be of benefit.  In addition with the recent introduction of Nexletol, I felt this could also add to lipid lowering.  It is well-tolerated and since she has had statin side effects I felt that would be appropriate for her.  I am pleased to report she has had a significant reduction in her lipid profile.  Her LDL cholesterol is reduced from 222 down to 26.  Total cholesterol has come down to 100 from 277.  HDL is increased from 30-38 and there has been a small increase in triglycerides from 126-181.  Some of this may be dietary and related to decreased exercise that she cannot participate in cardiac rehab at this time and they are moving to [facility] therefore have been eating out more.  She had reported some cramping in her hands which is also resolved off of Juxtapid.  07/04/2019  [name] is seen today in follow-up.  Unfortunately she contracted Covid in late December.  She says she was sick for about 2 weeks.  She is also fallen off  of her diet and has not been doing exercise.  Cardiac rehabilitation was closed.  She recently moved and her new apartment complex has a gym.  She plans to get back to that.  This seems to track with an increase in her lipids.  She remains on Repatha.  She will need a renewal of her prescription.  When evaluating her triglycerides, they have increased steadily over the past year from 74-126 -181-212.  This also tracks a weight gain from 130 to 142 pounds.  Lipids recently were 122 total cholesterol, triglycerides 212, HDL 38 and LDL 50.  01/12/2020  [name] is seen today for follow-up.  Overall her lipids have been fairly stable.  Cholesterol most recently was 129, triglycerides 222, HDL 35 and LDL 58.  She is tolerating triple therapy on Repatha, Nexletol and ezetimibe.  Liver enzymes have been normal.  She reports some unusual left upper quadrant pain which she thought initially was her liver however I remind her the liver was on the right upper quadrant.  In addition she is describing some upper neck and lower occipital pain.  She wonders if this might be related to restarting her exercise.  These episodes may last for couple hours.  She has not tried nitroglycerin for it and did not feel that it was her heart although it had some similar features to prior to her previous cardiac episodes.  02/22/2021  [name] returns today for follow-up.  I last saw her in August 2021.  Her cholesterol has still remained lower however seems to be climbing.  This is primarily dietary.  LDL now 69 up from 58.  Triglycerides are higher at 313, up from 222.  She reports compliance with the Repatha, bempedoic acid and ezetimibe.  She also reports a decrease in physical activity which may be contributing to this.  She continues to be asymptomatic from a cardiac standpoint.  08/23/2021  [name] returns today for follow-up.  She continues to have very good lipid control on her current combination of Repatha and Nexletol.  Her  total cholesterol 135, triglycerides 175 HDL 39 and LDL 66.  I did assess for an LP(a) which was elevated mildly at 108.3.  Probably this was higher since we expect 20-30% reduction of LP(a) on Repatha.  09/01/2022  [name] returns today for follow-up.  Her lipids unfortunately have worsened somewhat since I last saw her.  Total cholesterol now 176, HDL 44, triglycerides 197 and LDL 98.  She reports her  diet has been off.  She has been under a lot of stress with family issues and eating more fast food.  PMHx:  Past Medical History:  Diagnosis Date   Anemia    due to heavy periods   Anxiety    Coronary artery disease    Difficult intubation 01/22/2016   Headache    Heart murmur    aschild antibiotics if having Dental work   Hypocholesteremia    medical treatment   Ischemic cardiomyopathy    Menorrhagia    NSTEMI (non-ST elevated myocardial infarction)    Pelvic pain     Past Surgical History:  Procedure Laterality Date   ABDOMINAL HYSTERECTOMY     CORONARY ARTERY BYPASS GRAFT N/A 07/12/2016   Procedure: CORONARY ARTERY BYPASS GRAFTING (CABG), ON PUMP, TIMES ONE, USING LEFT INTERNAL MAMMARY ARTERY WITH TEE;  Surgeon: Alleen Borne, MD;  Location: [facility] OR;  Service: Open Heart Surgery;  Laterality: N/A;  LIMA to LAD   goiter     left side goiter removed   INGUINAL HERNIA REPAIR Bilateral 01/22/2016   Procedure: LAPAROSCOPIC BILATERAL FEMORAL AND RIGHT INGUINAL HERNIA REPAIRWITH INSERTION OF MESH;  Surgeon: Karie Soda, MD;  Location: [facility] ORS;  Service: General;  Laterality: Bilateral;   LEFT HEART CATH AND CORONARY ANGIOGRAPHY N/A 07/12/2016   Procedure: Left Heart Cath and Coronary Angiography;  Surgeon: Runell Gess, MD;  Location: [facility] INVASIVE CV LAB;  Service: Cardiovascular;  Laterality: N/A;   LEFT HEART CATH AND CORS/GRAFTS ANGIOGRAPHY N/A 03/08/2020   Procedure: LEFT HEART CATH AND CORS/GRAFTS ANGIOGRAPHY;  Surgeon: Runell Gess, MD;  Location: [facility] INVASIVE CV LAB;  Service:  Cardiovascular;  Laterality: N/A;   SVD     x 1   WISDOM TOOTH EXTRACTION      FAMHx:  Family History  Adopted: Yes    SOCHx:   reports that she has never smoked. She has never used smokeless tobacco. She reports that she does not drink alcohol and does not use drugs.  ALLERGIES:  Allergies  Allergen Reactions   Statins Other (See Comments)    Muscle aches with higher doses of: Crestor & Lipitor   Bactrim Itching and Rash         ROS: Pertinent items noted in HPI and remainder of comprehensive ROS otherwise negative.  HOME MEDS: Current Outpatient Medications on File Prior to Visit  Medication Sig Dispense Refill   aspirin 81 MG tablet Take 1 tablet (81 mg total) by mouth daily.     Coenzyme Q10 (CO Q-10) 200 MG CAPS 1 capsule with a meal     Evolocumab (REPATHA SURECLICK) 140 MG/ML SOAJ INJECT THE CONTENTS OF ONE PEN UNDER THE SKIN EVERY 14 DAYS 6 mL 3   ezetimibe (ZETIA) 10 MG tablet TAKE ONE TABLET BY MOUTH ONE TIME DAILY 90 tablet 3   isosorbide mononitrate (IMDUR) 30 MG 24 hr tablet TAKE ONE-HALF TABLET BY MOUTH ONE TIME DAILY 45 tablet 6   losartan (COZAAR) 25 MG tablet TAKE ONE TABLET BY MOUTH ONE TIME DAILY 90 tablet 3   metoprolol succinate (TOPROL-XL) 25 MG 24 hr tablet Take 1 tablet (25 mg total) by mouth daily. Patient must keep scheduled appointment for future refills 30 tablet 0   NEXLETOL 180 MG TABS TAKE 1 TABLET BY MOUTH EVERY DAY 90 tablet 3   nitroGLYCERIN (NITROSTAT) 0.4 MG SL tablet Place 1 tablet (0.4 mg total) under the tongue every 5 (five) minutes x 3 doses as needed for  chest pain. 30 tablet 6   Omega-3 Fatty Acids (OMEGA-3 PO) Take 1 capsule by mouth daily with breakfast. 369     spironolactone (ALDACTONE) 25 MG tablet TAKE ONE TABLET BY MOUTH ONE TIME DAILY 90 tablet 3   Coenzyme Q10 (COQ10) 200 MG CAPS Take 200 mg by mouth at bedtime.  (Patient not taking: Reported on 09/01/2022)     No current facility-administered medications on file prior to  visit.    LABS/IMAGING: No results found for this or any previous visit (from the past 48 hour(s)). No results found.  LIPID PANEL:    Component Value Date/Time   CHOL 176 08/11/2022 1045   TRIG 197 (H) 08/11/2022 1045   HDL 44 08/11/2022 1045   CHOLHDL 4.0 08/11/2022 1045   CHOLHDL 7.8 09/02/2018 0449   VLDL 15 09/02/2018 0449   LDLCALC 98 08/11/2022 1045     WEIGHTS: Wt Readings from Last 3 Encounters:  09/01/22 129 lb 6.4 oz (58.7 kg)  08/23/21 131 lb 12.8 oz (59.8 kg)  05/31/21 134 lb (60.8 kg)    VITALS: BP 106/64 (BP Location: Left Arm, Patient Position: Sitting, Cuff Size: Normal)   Pulse 83   Ht 5' (1.524 m)   Wt 129 lb 6.4 oz (58.7 kg)   LMP 08/10/2011   SpO2 96%   BMI 25.27 kg/m   EXAM: Deferred  EKG: Deferred  ASSESSMENT: Coronary artery disease status post single-vessel CABG to the LAD Ischemic cardiomyopathy, EF 40-45% HeFH (heterozygous for a change in LDLR (c.1103G>A, p.Cys368Tyr) Documented statin intolerance Hepatic steatosis-elevated liver enzymes Elevated LP(a) at 108.3  PLAN: 1.   Mrs. Zuidema been under a lot of stress and her diet has been suboptimal.  She recognizes this.  She wants to continue to work at improving it.  She is compliant with her medications.  I would suggest that we repeat lipids in about 3 months.  She already has a scheduled appointment with me in July and she can keep that.  She has follow-up with Dr. Allyson Sabal in the interim.  Chrystie Nose, MD, [facility], FACP  [place]  [facility]  Medical Director of the Advanced Lipid Disorders &  Cardiovascular Risk Reduction Clinic Attending Cardiologist  Direct Dial: [phone]  Fax: [phone]  Website:  [email or web address] Nicely Zekiel Torian 09/01/2022, 2:54 PM

## 2022-09-01 NOTE — Patient Instructions (Signed)
Medication Instructions:  NO CHANGES   Lab Work: FASTING lab work to check cholesterol and liver enzymes about a week before your visit in July     Follow-Up: At [facility], you and your health needs are our priority.  As part of our continuing mission to provide you with exceptional heart care, we have created designated Provider Care Teams.  These Care Teams include your primary Cardiologist (physician) and Advanced Practice Providers (APPs -  Physician Assistants and Nurse Practitioners) who all work together to provide you with the care you need, when you need it.  We recommend signing up for the patient portal called "MyChart".  Sign up information is provided on this After Visit Summary.  MyChart is used to connect with patients for Virtual Visits (Telemedicine).  Patients are able to view lab/test results, encounter notes, upcoming appointments, etc.  Non-urgent messages can be sent to your provider as well.   To learn more about what you can do with MyChart, go to [email or web address].    Your next appointment:    July 2nd @ 10:15am [facility]

## 2022-09-02 ENCOUNTER — Other Ambulatory Visit (HOSPITAL_COMMUNITY): Payer: Self-pay

## 2022-09-02 ENCOUNTER — Encounter (HOSPITAL_BASED_OUTPATIENT_CLINIC_OR_DEPARTMENT_OTHER): Payer: Self-pay | Admitting: Internal Medicine

## 2022-09-02 MED ORDER — NEXLETOL 180 MG PO TABS: 1.0000 | ORAL_TABLET | Freq: Every day | ORAL | 3 refills | Status: DC

## 2022-09-09 ENCOUNTER — Telehealth: Payer: Self-pay | Admitting: Internal Medicine

## 2022-09-09 NOTE — Telephone Encounter (Signed)
Called [employer] [place] [id number] Submitted PA for continuation of Repatha over the phone  Approved until 09/09/23 90 day supply

## 2022-09-11 ENCOUNTER — Other Ambulatory Visit (HOSPITAL_COMMUNITY): Payer: Self-pay

## 2022-09-11 NOTE — Telephone Encounter (Signed)
Per test claim PA is approved till 09/09/23

## 2022-09-21 ENCOUNTER — Other Ambulatory Visit: Payer: Self-pay | Admitting: Internal Medicine

## 2022-09-24 ENCOUNTER — Ambulatory Visit: Payer: Federal, State, Local not specified - PPO | Admitting: Cardiovascular Disease

## 2022-09-29 ENCOUNTER — Other Ambulatory Visit: Payer: Self-pay | Admitting: Cardiovascular Disease

## 2022-10-14 DIAGNOSIS — L729 Follicular cyst of the skin and subcutaneous tissue, unspecified: Secondary | ICD-10-CM | POA: Diagnosis not present

## 2022-10-28 ENCOUNTER — Encounter: Payer: Self-pay | Admitting: Cardiovascular Disease

## 2022-10-28 ENCOUNTER — Ambulatory Visit: Payer: Federal, State, Local not specified - PPO | Attending: Cardiovascular Disease | Admitting: Cardiovascular Disease

## 2022-10-28 ENCOUNTER — Other Ambulatory Visit: Payer: Self-pay | Admitting: *Deleted

## 2022-10-28 ENCOUNTER — Encounter: Payer: Self-pay | Admitting: *Deleted

## 2022-10-28 VITALS — BP 110/70 | HR 62 | Ht 60.0 in | Wt 125.4 lb

## 2022-10-28 DIAGNOSIS — Z951 Presence of aortocoronary bypass graft: Secondary | ICD-10-CM

## 2022-10-28 DIAGNOSIS — E7849 Other hyperlipidemia: Secondary | ICD-10-CM

## 2022-10-28 DIAGNOSIS — I214 Non-ST elevation (NSTEMI) myocardial infarction: Secondary | ICD-10-CM

## 2022-10-28 DIAGNOSIS — I255 Ischemic cardiomyopathy: Secondary | ICD-10-CM | POA: Diagnosis not present

## 2022-10-28 DIAGNOSIS — I10 Essential (primary) hypertension: Secondary | ICD-10-CM

## 2022-10-28 DIAGNOSIS — R918 Other nonspecific abnormal finding of lung field: Secondary | ICD-10-CM

## 2022-10-28 NOTE — Assessment & Plan Note (Signed)
History of non-STEMI 07/12/2016 with catheter revealed 99% ostial LAD lesion.  She underwent urgent CABG x 1 LIMA to her LAD.  I did a heart cath on her 03/08/2020 after Myoview showed anteroapical scar with peri-infarct ischemia that revealed an occluded ostial LAD with a patent LIMA and otherwise insignificant CAD.  She is asymptomatic.

## 2022-10-28 NOTE — Assessment & Plan Note (Signed)
History of ischemic cardiomyopathy with 2D echo performed 06/13/2021 revealing EF of 40 to 45% with global hypokinesia and apical aneurysm formation.  She is on guideline directed optimal medical therapy.

## 2022-10-28 NOTE — Patient Instructions (Signed)
    Follow-Up: At [facility], you and your health needs are our priority.  As part of our continuing mission to provide you with exceptional heart care, we have created designated Provider Care Teams.  These Care Teams include your primary Cardiologist (physician) and Advanced Practice Providers (APPs -  Physician Assistants and Nurse Practitioners) who all work together to provide you with the care you need, when you need it.  We recommend signing up for the patient portal called "MyChart".  Sign up information is provided on this After Visit Summary.  MyChart is used to connect with patients for Virtual Visits (Telemedicine).  Patients are able to view lab/test results, encounter notes, upcoming appointments, etc.  Non-urgent messages can be sent to your provider as well.   To learn more about what you can do with MyChart, go to [email or web address].    Your next appointment:   12 month(s)  Provider:   Jonathan Berry, MD      

## 2022-10-28 NOTE — Assessment & Plan Note (Signed)
History of essential hypertension blood pressure measured at 110/70.  She is on metoprolol and losartan.

## 2022-10-28 NOTE — Progress Notes (Signed)
10/28/2022 Rennis Harding   [birth year]  [id number]  Primary Physician Daisy Floro, MD Primary Cardiologist: Runell Gess MD [name], [name]  HPI:  [name] is a 52 y.o.   mildly overweight married Caucasian female mother of one 73 year old daughter.  I last saw her  in the office 05/31/2021. She underwent coronary artery bypass grafting times one emergently by Dr. Laneta Simmers on 07/12/16. The remainder of her coronary arteries were unremarkable. She did have a non-STEMI on that day with progressively rising troponins and EKG changes. She had a 99% ostial LAD with anteroapical wall motion abnormality. She is recuperating nicely. She is about to start cardiac rehabilitation. She denies chest pain or shortness of breath. She does complain of some cramping in her legs which may be statin-related. Her last echo performed 10/21/16 revealed ejection fraction of 40-45% with wall motion and a all motion abnormality in the LAD distribution.  She was begun on carvedilol which apparently she did not tolerate. She was also placed on Repatha which did not affect her lipid profile as well as Praluent. She was referred to Dr. Pricilla Holm for genetic testing for presumed FH. Repeat 2-D echo performed 05/06/17 revealed an EF of 40-45% with anteroapical hypokinesia.   I did refer her to Dr. Rennis Golden for advanced lipid evaluation therapy.  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  She was unresponsive to statin therapy and PCSK9.  She is now being tried on Juxtapid.  I also sent her to Dr. Gala Romney in the advanced heart failure clinic.  She is on optimal medical therapy for this.  She is now NYHA class I-2 is relatively asymptomatic with an EF of 45 to 50%.     She was admitted 09/01/2018 for 1 day.  She ruled out for myocardial infarction.  She had no acute EKG changes.  A 2D echo was performed that showed an EF in the 35 to 40% range with apical aneurysm and inferior hypokinesia.  She was seen by Dr. Tenny Craw  and sent home for noncardiac chest pain.  Since being at home she is been asymptomatic.  She was taken off her beta-blocker because of bradycardia on telemetry although now she complains of tachycardia.    I did place her back on a beta-blocker   She has been seeing Dr. Rennis Golden for lipid management with incredible results.  She is complained of some fatigue, dyspnea on exertion since she is been going back to the gym and some back pain similar to her infarct pain.  She had a 2D echo performed 02/28/2020 that showed an EF in the 45 to 50% range and a Myoview stress test performed the following day that showed anteroapical scar with peri-infarct ischemia.  Based on this I performed diagnostic coronary angiography 03/08/2020 revealing an occluded ostial LAD, patent LIMA to the LAD and otherwise no significant CAD.     Since I saw her a year and a half ago she is remained stable.  She denies chest pain or shortness of breath.  She continues to see Dr. Rennis Golden.  Most recent lipid profile unfortunately performed 08/11/2022 shows mild worsening attributable to dietary indiscretion.   Current Meds  Medication Sig   aspirin 81 MG tablet Take 1 tablet (81 mg total) by mouth daily.   Coenzyme Q10 (CO Q-10) 200 MG CAPS 1 capsule with a meal   Evolocumab (REPATHA SURECLICK) 140 MG/ML SOAJ INJECT THE CONTENTS OF ONE PEN UNDER THE SKIN EVERY  14 DAYS   ezetimibe (ZETIA) 10 MG tablet TAKE ONE TABLET BY MOUTH ONE TIME DAILY   isosorbide mononitrate (IMDUR) 30 MG 24 hr tablet TAKE ONE-HALF TABLET BY MOUTH ONE TIME DAILY   losartan (COZAAR) 25 MG tablet TAKE ONE TABLET BY MOUTH ONE TIME DAILY   metoprolol succinate (TOPROL-XL) 25 MG 24 hr tablet Take 1 tablet (25 mg total) by mouth daily.   NEXLETOL 180 MG TABS TAKE 1 TABLET BY MOUTH EVERY DAY   nitroGLYCERIN (NITROSTAT) 0.4 MG SL tablet Place 1 tablet (0.4 mg total) under the tongue every 5 (five) minutes x 3 doses as needed for chest pain.   Omega-3 Fatty Acids (OMEGA-3  PO) Take 1 capsule by mouth daily with breakfast. 369   spironolactone (ALDACTONE) 25 MG tablet TAKE ONE TABLET BY MOUTH ONE TIME DAILY     Allergies  Allergen Reactions   Statins Other (See Comments)    Muscle aches with higher doses of: Crestor & Lipitor   Bactrim Itching and Rash         Social History   Socioeconomic History   Marital status: Married    Spouse name: Not on file   Number of children: Not on file   Years of education: Not on file   Highest education level: Not on file  Occupational History   Not on file  Tobacco Use   Smoking status: Never   Smokeless tobacco: Never  Substance and Sexual Activity   Alcohol use: No   Drug use: No   Sexual activity: Yes    Birth control/protection: None  Other Topics Concern   Not on file  Social History Narrative   Not on file   Social Determinants of Health   Financial Resource Strain: Not on file  Food Insecurity: Not on file  Transportation Needs: Not on file  Physical Activity: Not on file  Stress: Not on file  Social Connections: Not on file  Intimate Partner Violence: Not on file     Review of Systems: General: negative for chills, fever, night sweats or weight changes.  Cardiovascular: negative for chest pain, dyspnea on exertion, edema, orthopnea, palpitations, paroxysmal nocturnal dyspnea or shortness of breath Dermatological: negative for rash Respiratory: negative for cough or wheezing Urologic: negative for hematuria Abdominal: negative for nausea, vomiting, diarrhea, bright red blood per rectum, melena, or hematemesis Neurologic: negative for visual changes, syncope, or dizziness All other systems reviewed and are otherwise negative except as noted above.    Blood pressure 110/70, pulse 62, height 5' (1.524 m), weight 125 lb 6.4 oz (56.9 kg), last menstrual period 08/10/2011, SpO2 97 %.  General appearance: alert and no distress Neck: no adenopathy, no carotid bruit, no JVD, supple,  symmetrical, trachea midline, and thyroid not enlarged, symmetric, no tenderness/mass/nodules Lungs: clear to auscultation bilaterally Heart: regular rate and rhythm, S1, S2 normal, no murmur, click, rub or gallop Extremities: extremities normal, atraumatic, no cyanosis or edema Pulses: 2+ and symmetric Skin: Skin color, texture, turgor normal. No rashes or lesions Neurologic: Grossly normal  EKG sinus rhythm at 62 with anterior infarct.  I personally reviewed this EKG.  ASSESSMENT AND PLAN:   NSTEMI (non-ST elevated myocardial infarction) (HCC) History of non-STEMI 07/12/2016 with catheter revealed 99% ostial LAD lesion.  She underwent urgent CABG x 1 LIMA to her LAD.  I did a heart cath on her 03/08/2020 after Myoview showed anteroapical scar with peri-infarct ischemia that revealed an occluded ostial LAD with a patent LIMA and otherwise  insignificant CAD.  She is asymptomatic.  Familial hyperlipidemia History of familial hyperlipidemia followed by Dr. Rennis Golden on Zetia and Repatha as well as Nexletol with recent lipid profile revealing slightly worsening numbers performed 08/11/2018/total cholesterol 176, LDL 98 and HDL of 44.  She does admit to dietary indiscretion with fast food.  Ischemic cardiomyopathy History of ischemic cardiomyopathy with 2D echo performed 06/13/2021 revealing EF of 40 to 45% with global hypokinesia and apical aneurysm formation.  She is on guideline directed optimal medical therapy.  Hypertension History of essential hypertension blood pressure measured at 110/70.  She is on metoprolol and losartan.     Runell Gess MD FACP,FACC,FAHA, [facility] 10/28/2022 10:03 AM

## 2022-10-28 NOTE — Assessment & Plan Note (Signed)
History of familial hyperlipidemia followed by Dr. Rennis Golden on Zetia and Repatha as well as Nexletol with recent lipid profile revealing slightly worsening numbers performed 08/11/2018/total cholesterol 176, LDL 98 and HDL of 44.  She does admit to dietary indiscretion with fast food.

## 2022-11-11 ENCOUNTER — Ambulatory Visit: Payer: Self-pay | Admitting: Surgery

## 2022-11-11 DIAGNOSIS — R220 Localized swelling, mass and lump, head: Secondary | ICD-10-CM | POA: Diagnosis present

## 2022-11-11 DIAGNOSIS — I429 Cardiomyopathy, unspecified: Secondary | ICD-10-CM | POA: Insufficient documentation

## 2022-11-21 DIAGNOSIS — E7849 Other hyperlipidemia: Secondary | ICD-10-CM | POA: Diagnosis not present

## 2022-11-21 DIAGNOSIS — R748 Abnormal levels of other serum enzymes: Secondary | ICD-10-CM | POA: Diagnosis not present

## 2022-11-21 LAB — LIPID PANEL
Chol/HDL Ratio: 4.9 ratio — ABNORMAL HIGH (ref 0.0–4.4)
Cholesterol, Total: 183 mg/dL (ref 100–199)
HDL: 37 mg/dL — ABNORMAL LOW (ref >39)
LDL Chol Calc (NIH): 93 mg/dL (ref 0–99)
Triglycerides: 316 mg/dL — ABNORMAL HIGH (ref 0–149)
VLDL Cholesterol Cal: 53 mg/dL — ABNORMAL HIGH (ref 5–40)

## 2022-11-21 LAB — HEPATIC FUNCTION PANEL
ALT: 31 IU/L (ref 0–32)
AST: 35 IU/L (ref 0–40)
Albumin: 4.9 g/dL (ref 3.8–4.9)
Alkaline Phosphatase: 63 IU/L (ref 44–121)
Bilirubin Total: 0.5 mg/dL (ref 0.0–1.2)
Bilirubin, Direct: 0.16 mg/dL (ref 0.00–0.40)
Total Protein: 7.7 g/dL (ref 6.0–8.5)

## 2022-11-25 ENCOUNTER — Other Ambulatory Visit (HOSPITAL_COMMUNITY): Payer: Self-pay

## 2022-11-25 ENCOUNTER — Encounter (HOSPITAL_BASED_OUTPATIENT_CLINIC_OR_DEPARTMENT_OTHER): Payer: Self-pay | Admitting: Internal Medicine

## 2022-11-25 ENCOUNTER — Telehealth: Payer: Self-pay

## 2022-11-25 ENCOUNTER — Ambulatory Visit (HOSPITAL_BASED_OUTPATIENT_CLINIC_OR_DEPARTMENT_OTHER): Payer: Federal, State, Local not specified - PPO | Admitting: Internal Medicine

## 2022-11-25 VITALS — BP 117/71 | HR 67 | Ht 60.0 in | Wt 125.3 lb

## 2022-11-25 DIAGNOSIS — E7849 Other hyperlipidemia: Secondary | ICD-10-CM | POA: Diagnosis not present

## 2022-11-25 DIAGNOSIS — E7841 Elevated Lipoprotein(a): Secondary | ICD-10-CM | POA: Diagnosis not present

## 2022-11-25 DIAGNOSIS — Z951 Presence of aortocoronary bypass graft: Secondary | ICD-10-CM | POA: Diagnosis not present

## 2022-11-25 DIAGNOSIS — M791 Myalgia, unspecified site: Secondary | ICD-10-CM

## 2022-11-25 DIAGNOSIS — I255 Ischemic cardiomyopathy: Secondary | ICD-10-CM

## 2022-11-25 DIAGNOSIS — T466X5D Adverse effect of antihyperlipidemic and antiarteriosclerotic drugs, subsequent encounter: Secondary | ICD-10-CM

## 2022-11-25 MED ORDER — ICOSAPENT ETHYL 1 G PO CAPS: 2.0000 g | ORAL_CAPSULE | Freq: Two times a day (BID) | ORAL | 3 refills | Status: DC

## 2022-11-25 NOTE — Patient Instructions (Signed)
Medication Instructions:  STOP over-the-counter fish oil   START vascepa 2 capsules twice daily  *If you need a refill on your cardiac medications before your next appointment, please call your pharmacy*   Lab Work: FASTING lab work in 3-4 months to check cholesterol   If you have labs (blood work) drawn today and your tests are completely normal, you will receive your results only by: MyChart Message (if you have MyChart) OR A paper copy in the mail If you have any lab test that is abnormal or we need to change your treatment, we will call you to review the results.   Follow-Up: At [facility], you and your health needs are our priority.  As part of our continuing mission to provide you with exceptional heart care, we have created designated Provider Care Teams.  These Care Teams include your primary Cardiologist (physician) and Advanced Practice Providers (APPs -  Physician Assistants and Nurse Practitioners) who all work together to provide you with the care you need, when you need it.  We recommend signing up for the patient portal called "MyChart".  Sign up information is provided on this After Visit Summary.  MyChart is used to connect with patients for Virtual Visits (Telemedicine).  Patients are able to view lab/test results, encounter notes, upcoming appointments, etc.  Non-urgent messages can be sent to your provider as well.   To learn more about what you can do with MyChart, go to [email or web address].    Your next appointment:    4 months with Dr. Rennis Golden  Other Instructions You have been referred to cardiac rehab at [facility]

## 2022-11-25 NOTE — Telephone Encounter (Signed)
Yes, because "Refill too soon. Next available fill date 02/01/23, last fill date 11/25/22 (todays date) filled at [facility] pharmacy." I can't get a paid claim on something insurance paid for today.

## 2022-11-25 NOTE — Telephone Encounter (Signed)
Per test claim pharmacy filled today. No PA required.

## 2022-11-25 NOTE — Progress Notes (Signed)
OFFICE NOTE  Chief Complaint:  Follow-up dyslipidemia  Primary Care Physician: Rachel Floro, MD  HPI:  Rachel [name] is a 52 y.o. female with a past medial history significant for emergent single-vessel coronary artery bypass grafting with a 99% ostial LAD stenosis in February 2018.  She was not found to have any other significant obstructive coronary disease at the time.  Echo in May 2018 revealed an LVEF of 40-45% and an LAD wall motion abnormality.  He is a patient of Dr. Allyson [name].  She has an extensive history of dyslipidemia and was told by her uncle when she was a child in [place] that her cholesterol was very high and that she would likely need to be on treatment.  She is adopted and does not know her birth parents, but does have a daughter who is 77 years old.  She has not had cholesterol testing to her knowledge.  She had been started on statins, but reports significant intolerance, this is to high-dose atorvastatin, rosuvastatin, and possibly others that she cannot recall.  She was then referred to start Repatha.  Her lipid profile as of July 2018 showed total cholesterol 246, triglycerides 237, HDL 35 and LDL 164.  After starting therapy on Repatha, she received 4 doses, approximately 2 months of therapy and her repeat lipid profile showed a total cholesterol 265, triglycerides 220, LDL 176 and HDL of 45.  This is suggesting that she has had no response to the PCS canine inhibitor.  She reports compliance with the medication.  She had no side effects from it.  Possible reasons for this include a PCSK9 gain of function mutation or LDL receptor mutation.  Findings are possibly suggestive of familial hyperlipidemia although her LDL is moderately elevated.  Initially the plan was to consider switching her to Praluent and she has been provided samples, however, it is likely that she will have a similar poor response.  10/30/2017  Rachel [name] returns [name] for follow-up of dyslipidemia.  There  is clinical concern for familial hyperlipidemia and she does carry that diagnosis.  Unfortunately as previously mentioned she did not respond to PCSK9 inhibitor.  She did proceed with genetic testing after genetic counseling by Dr. Sidney [name]. 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  This finding is a pathogenic variant that does not allow the LDL receptor to fold properly and it is therefore poorly trafficked out of the golgi apparatus and rarely is expressed on the surface of the liver.  Based on this finding, it is understandable why a PCSK9 inhibitor which works by inhibiting the PCSK9 co-protein associated with surface LDL receptors would be ineffective as the total number of surface LDL receptors are extremely low.  We further discussed this mechanism and other strategies which may be helpful to lower her cholesterol.  She was again tried on pravastatin by the advanced heart failure clinic pharmacist, however had significant myalgias with this and may have some myopathy.  She says [name] that she is just now being able to straighten out the fingers on her hands.  At this point there are few additional options although she remains at high risk of recurrent events.  Her most recent cholesterol from several months ago showed total cholesterol 305, triglycerides 202, HDL 47 and LDL-C of 218.  Remaining options for her include LDL apheresis, for which she is likely a good candidate however is associated with significant cost and is cumbersome and would require her to  likely [place].  The other option would be lomitapide.  By mechanism of inhibiting MTP, lomitapide should be effective at lowering her overall lipid profile by 30 to 40%, but is associated with significant GI side effects and liver toxicity.  This will need to be monitored closely and requires prescription by a provider who has completed the REMS  program (which I am certified).  02/12/2018  Rachel [name] for follow-up.  We have been working very diligently over the past several months to try to get her approval for lomitapide.  After multiple appeal she has finally been approved.  She indicates that she may be able to get this for no co-pay.  We will again need to repeat a lipid profile and liver enzymes as a baseline since is been 3 months since her last study and she would anticipate starting the medication next week.  We will need to monitor her liver enzymes very closely.  She is also made significant dietary changes at the direction of the nutritionist for Juxtapid who is been working with her telephonically on the importance of changing her diet.  05/11/2018  Rachel [name] for follow-up.  She seems to be tolerating Juxtapid, but does have some issues with joint pain which is been a longstanding problem.  We are adhering to the rems program, and she is having monthly liver enzyme tests.  She has known steatohepatitis and has had elevated liver enzymes in the past.  I am pleased to report her lipids have improved.  Her total cholesterol came down from 379-274 with triglycerides down from 300-200 and LDL from 284 to 224.  Unfortunately her liver enzymes are climbing.  Over the past 6 months her ALT is climbed from 54 to 155 and AST from 34 to 65.  The ALT is now about 5 times upper limit normal.  I discussed this with her at this point and concerned that she may not be able to tolerate the medication.  Guidelines for the manufacturer suggest decreasing the dose however she is on the lowest dose.  I discussed this further with our pharmacist Rachel [name], Pharm.D., who reviewed the pharmacokinetics of the drug and given its longer half-life of close to 48 hours felt that the drug could be effectively dosed every other day and perhaps still maintain steady state while reducing the possibility of side effects.  In addition, Rachel [name] suffers  from arthritic pain and recently has had an upper respiratory infection which worsened her symptoms.  She has been taking Tylenol fairly regularly for this since she was once told not to take nonsteroidals due to her heart disease.  She did not realize that the Tylenol could worsen her liver enzyme abnormalities.  01/19/2019  Rachel [name] is seen [name] in follow-up.  Overall she seems to be doing quite well.  Although we had been maintaining her on Juxtapid, I was disappointed in the minimal reduction in her lipids.  This was at the trade off of elevated liver enzymes.  Initially there was some concern about coadministration of Tylenol which we discontinued and it did improve her numbers however the liver enzymes did not allow us to uptitrate the medication.  Her cholesterol really did not get a meaningful reduction.  Previously it was noted that she had had some benefit although not as much as expected with a PCSK9 inhibitor.  I recommended that we restart Repatha to see if that would be of benefit.  In addition with the recent introduction of Nexletol, I felt this could also add to lipid lowering.  It is well-tolerated and since she has had statin side effects I felt that would be appropriate for her.  I am pleased to report she has had a significant reduction in her lipid profile.  Her LDL cholesterol is reduced from 222 down to 26.  Total cholesterol has come down to 100 from 277.  HDL is increased from 30-38 and there has been a small increase in triglycerides from 126-181.  Some of this may be dietary and related to decreased exercise that she cannot participate in cardiac rehab at this time and they are moving to [facility] therefore have been eating out more.  She had reported some cramping in her hands which is also resolved off of Juxtapid.  07/04/2019  Rachel [name] is seen [name] in follow-up.  Unfortunately she contracted Covid in late December.  She says she was sick for about 2 weeks.  She is also fallen off  of her diet and has not been doing exercise.  Cardiac rehabilitation was closed.  She recently moved and her new apartment complex has a gym.  She plans to get back to that.  This seems to track with an increase in her lipids.  She remains on Repatha.  She will need a renewal of her prescription.  When evaluating her triglycerides, they have increased steadily over the past year from 74-126 -181-212.  This also tracks a weight gain from 130 to 142 pounds.  Lipids recently were 122 total cholesterol, triglycerides 212, HDL 38 and LDL 50.  01/12/2020  Rachel [name] is seen [name] for follow-up.  Overall her lipids have been fairly stable.  Cholesterol most recently was 129, triglycerides 222, HDL 35 and LDL 58.  She is tolerating triple therapy on Repatha, Nexletol and ezetimibe.  Liver enzymes have been normal.  She reports some unusual left upper quadrant pain which she thought initially was her liver however I remind her the liver was on the right upper quadrant.  In addition she is describing some upper neck and lower occipital pain.  She wonders if this might be related to restarting her exercise.  These episodes may last for couple hours.  She has not tried nitroglycerin for it and did not feel that it was her heart although it had some similar features to prior to her previous cardiac episodes.  02/22/2021  Rachel [name] returns [name] for follow-up.  I last saw her in August 2021.  Her cholesterol has still remained lower however seems to be climbing.  This is primarily dietary.  LDL now 69 up from 58.  Triglycerides are higher at 313, up from 222.  She reports compliance with the Repatha, bempedoic acid and ezetimibe.  She also reports a decrease in physical activity which may be contributing to this.  She continues to be asymptomatic from a cardiac standpoint.  08/23/2021  Rachel [name] returns [name] for follow-up.  She continues to have very good lipid control on her current combination of Repatha and Nexletol.  Her  total cholesterol 135, triglycerides 175 HDL 39 and LDL 66.  I did assess for an LP(a) which was elevated mildly at 108.3.  Probably this was higher since we expect 20-30% reduction of LP(a) on Repatha.  09/01/2022  Rachel [name] returns [name] for follow-up.  Her lipids unfortunately have worsened somewhat since I last saw her.  Total cholesterol now 176, HDL 44, triglycerides 197 and LDL 98.  She reports her  diet has been off.  She has been under a lot of stress with family issues and eating more fast food.  11/25/2022  Rachel [name] returns [name] for follow-up.  She has tried to make substantive dietary changes since we last saw her unfortunately her cholesterol is worse.  Total now 183, triglycerides 316, HDL 37 and LDL 93.  She reports infrequent use of over-the-counter fish oil may be 1 capsule a day.  Otherwise she is consistently taking her other lipid-lowering medications.  She says she is never been on any prescription fish oil in the past, however with her history of coronary artery disease that would likely be indicated at this level of triglyceride elevation.  PMHx:  Past Medical History:  Diagnosis Date   Anemia    due to heavy periods   Anxiety    Coronary artery disease    Difficult intubation 01/22/2016   Headache    Heart murmur    aschild antibiotics if having Dental work   Hypocholesteremia    medical treatment   Ischemic cardiomyopathy    Menorrhagia    NSTEMI (non-ST elevated myocardial infarction) (HCC)    Pelvic pain     Past Surgical History:  Procedure Laterality Date   ABDOMINAL HYSTERECTOMY     CORONARY ARTERY BYPASS GRAFT N/A 07/12/2016   Procedure: CORONARY ARTERY BYPASS GRAFTING (CABG), ON PUMP, TIMES ONE, USING LEFT INTERNAL MAMMARY ARTERY WITH TEE;  Surgeon: Alleen Borne, MD;  Location: [facility] OR;  Service: Open Heart Surgery;  Laterality: N/A;  LIMA to LAD   goiter     left side goiter removed   INGUINAL HERNIA REPAIR Bilateral 01/22/2016   Procedure: LAPAROSCOPIC  BILATERAL FEMORAL AND RIGHT INGUINAL HERNIA REPAIRWITH INSERTION OF MESH;  Surgeon: Karie Soda, MD;  Location: [facility] ORS;  Service: General;  Laterality: Bilateral;   LEFT HEART CATH AND CORONARY ANGIOGRAPHY N/A 07/12/2016   Procedure: Left Heart Cath and Coronary Angiography;  Surgeon: Runell Gess, MD;  Location: [facility] INVASIVE CV LAB;  Service: Cardiovascular;  Laterality: N/A;   LEFT HEART CATH AND CORS/GRAFTS ANGIOGRAPHY N/A 03/08/2020   Procedure: LEFT HEART CATH AND CORS/GRAFTS ANGIOGRAPHY;  Surgeon: Runell Gess, MD;  Location: [facility] INVASIVE CV LAB;  Service: Cardiovascular;  Laterality: N/A;   SVD     x 1   WISDOM TOOTH EXTRACTION      FAMHx:  Family History  Adopted: Yes    SOCHx:   reports that she has never smoked. She has never used smokeless tobacco. She reports that she does not drink alcohol and does not use drugs.  ALLERGIES:  Allergies  Allergen Reactions   Statins Other (See Comments)    Muscle aches with higher doses of: Crestor & Lipitor   Bactrim Itching and Rash         ROS: Pertinent items noted in HPI and remainder of comprehensive ROS otherwise negative.  HOME MEDS: Current Outpatient Medications on File Prior to Visit  Medication Sig Dispense Refill   aspirin 81 MG tablet Take 1 tablet (81 mg total) by mouth daily.     Coenzyme Q10 (CO Q-10) 200 MG CAPS 1 capsule with a meal     Evolocumab (REPATHA SURECLICK) 140 MG/ML SOAJ INJECT THE CONTENTS OF ONE PEN UNDER THE SKIN EVERY 14 DAYS 6 mL 3   ezetimibe (ZETIA) 10 MG tablet TAKE ONE TABLET BY MOUTH ONE TIME DAILY 90 tablet 3   isosorbide mononitrate (IMDUR) 30 MG 24 hr tablet TAKE ONE-HALF TABLET BY  MOUTH ONE TIME DAILY 45 tablet 6   losartan (COZAAR) 25 MG tablet TAKE ONE TABLET BY MOUTH ONE TIME DAILY 90 tablet 3   metoprolol succinate (TOPROL-XL) 25 MG 24 hr tablet Take 1 tablet (25 mg total) by mouth daily. 30 tablet 0   NEXLETOL 180 MG TABS TAKE 1 TABLET BY MOUTH EVERY DAY 90 tablet 3    nitroGLYCERIN (NITROSTAT) 0.4 MG SL tablet Place 1 tablet (0.4 mg total) under the tongue every 5 (five) minutes x 3 doses as needed for chest pain. 30 tablet 6   Omega-3 Fatty Acids (OMEGA-3 PO) Take 1 capsule by mouth daily with breakfast. 369     spironolactone (ALDACTONE) 25 MG tablet TAKE ONE TABLET BY MOUTH ONE TIME DAILY 90 tablet 3   No current facility-administered medications on file prior to visit.    LABS/IMAGING: No results found for this or any previous visit (from the past 48 hour(s)). No results found.  LIPID PANEL:    Component Value Date/Time   CHOL 183 11/21/2022 0959   TRIG 316 (H) 11/21/2022 0959   HDL 37 (L) 11/21/2022 0959   CHOLHDL 4.9 (H) 11/21/2022 0959   CHOLHDL 7.8 09/02/2018 0449   VLDL 15 09/02/2018 0449   LDLCALC 93 11/21/2022 0959     WEIGHTS: Wt Readings from Last 3 Encounters:  11/25/22 125 lb 4.8 oz (56.8 kg)  10/28/22 125 lb 6.4 oz (56.9 kg)  09/01/22 129 lb 6.4 oz (58.7 kg)    VITALS: BP 117/71 (BP Location: Left Arm, Patient Position: Sitting, Cuff Size: Normal)   Pulse 67   Ht 5' (1.524 m)   Wt 125 lb 4.8 oz (56.8 kg)   LMP 08/10/2011   SpO2 93%   BMI 24.47 kg/m   EXAM: Deferred  EKG: Deferred  ASSESSMENT: Coronary artery disease status post single-vessel CABG to the LAD Ischemic cardiomyopathy, EF 40-45% HeFH (heterozygous for a change in LDLR (c.1103G>A, p.Cys368Tyr) Documented statin intolerance Hepatic steatosis-elevated liver enzymes Elevated LP(a) at 108.3  PLAN: 1.   Rachel [name] has made dietary changes and does do walking although her physical activity level is not substantial.  She says she would be interested in doing maintenance cardiac rehab since she had a good experience initially.  She now lives in [place] and we will place a referral for rehab there.  Indications are coronary artery disease and ischemic cardiomyopathy.  This should help with her triglycerides and lipids but she will additionally need  therapy.  I advised her to stop over-the-counter fish oil will start with Vascepa 2 g twice daily.  Plan repeat lipids in about 3 to 4 months and follow-up with me at that time.  Chrystie Nose, MD, [facility], FACP  [place]  [facility]  Medical Director of the Advanced Lipid Disorders &  Cardiovascular Risk Reduction Clinic Attending Cardiologist  Direct Dial: [phone]  Fax: [phone]  Website:  [email or web address] Josephson 11/25/2022, 10:44 AM

## 2022-11-25 NOTE — Telephone Encounter (Signed)
-----   Message from Lindell Spar, RN sent at 11/25/2022 10:59 AM EDT ----- Regarding: vascepa PA Hello   This patient has [employer] FEP. She needs a PA for Vascepa 1 gram capsule - take 2 PO BID. Dx: hypertriglyceridemia, coronary artery disease (CABG)

## 2022-12-04 ENCOUNTER — Other Ambulatory Visit: Payer: Self-pay | Admitting: Internal Medicine

## 2022-12-24 DIAGNOSIS — H00022 Hordeolum internum right lower eyelid: Secondary | ICD-10-CM | POA: Diagnosis not present

## 2022-12-30 ENCOUNTER — Encounter (HOSPITAL_BASED_OUTPATIENT_CLINIC_OR_DEPARTMENT_OTHER): Payer: Self-pay | Admitting: Internal Medicine

## 2022-12-31 ENCOUNTER — Other Ambulatory Visit: Payer: Self-pay

## 2022-12-31 ENCOUNTER — Encounter (HOSPITAL_BASED_OUTPATIENT_CLINIC_OR_DEPARTMENT_OTHER): Payer: Self-pay | Admitting: Surgery

## 2022-12-31 ENCOUNTER — Telehealth: Payer: Self-pay | Admitting: Pharmacist Clinician (PhC)/ Clinical Pharmacy Specialist

## 2022-12-31 NOTE — Progress Notes (Signed)
Spoke w/ via phone for pre-op interview---pt Lab needs dos----   I stat            Lab results------see below COVID test -----patient states asymptomatic no test needed Arrive at -------800 am 01-08-2023 NPO after MN NO Solid Food.  Clear liquids from MN until---700 am Med rec completed Medications to take morning of surgery -----isosorbide mononitrate Diabetic medication -----n/a Patient instructed no nail polish to be worn day of surgery Patient instructed to bring photo id and insurance card day of surgery Patient aware to have Driver (ride ) / caregiver   husband david  for 24 hours after surgery  Patient Special Instructions -----none Pre-Op special Instructions -----none Patient verbalized understanding of instructions that were given at this phone interview. Patient denies shortness of breath, chest pain, fever, cough at this phone interview.  Anesthesia Review: cad, cabg x 1  lima to lad, ischemic cardiomyopathy, htn, familial hyperlipidemia  Cardiologist :dr Allyson Sabal 10-28-2022 chart/epic CT angio chest 09-01-2018 epic EKG :10-28-2022 chart/epic Echo :06-13-2021 epic Stress test: 03-10-2020 epic Cardiac Cath : 03-08-2020 epic Activity level: works full time Sleep Study/ CPAP : none 81 mg asa can stay on per dr gross instructions, last dose to be 01-07-2023 day before surgery

## 2022-12-31 NOTE — Telephone Encounter (Signed)
PA for Nexletol needs renewal.  Thank you

## 2023-01-01 ENCOUNTER — Telehealth: Payer: Self-pay

## 2023-01-01 ENCOUNTER — Other Ambulatory Visit (HOSPITAL_COMMUNITY): Payer: Self-pay

## 2023-01-01 NOTE — Telephone Encounter (Addendum)
Pharmacy Patient Advocate Encounter   Received notification from Physician's Office/PHARMD KRISTINthat prior authorization for NEXLEOL 180MG  is required/requested.   Insurance verification completed.   The patient is insured through [facility] .   Per test claim: PA required; PA submitted to [facility] via CoverMyMeds Key/confirmation #/EOC [id number]        Status is pending

## 2023-01-02 ENCOUNTER — Other Ambulatory Visit (HOSPITAL_COMMUNITY): Payer: Self-pay

## 2023-01-02 NOTE — Telephone Encounter (Signed)
Pharmacy Patient Advocate Encounter  Received notification from [facility] that Prior Authorization for Nexletol 180MG  tablets has been APPROVED from 12/02/2022 to 01/01/2024. Ran test claim, Copay is $296.72. This test claim was processed through [facility]- copay amounts may vary at other pharmacies due to pharmacy/plan contracts, or as the patient moves through the different stages of their insurance plan.   PA #/Case ID/Reference #: [id number]

## 2023-01-05 NOTE — Telephone Encounter (Signed)
See other encounter.

## 2023-01-08 ENCOUNTER — Other Ambulatory Visit: Payer: Self-pay

## 2023-01-08 ENCOUNTER — Ambulatory Visit (HOSPITAL_BASED_OUTPATIENT_CLINIC_OR_DEPARTMENT_OTHER): Payer: Federal, State, Local not specified - PPO | Admitting: Certified Registered Nurse Anesthetist

## 2023-01-08 ENCOUNTER — Ambulatory Visit (HOSPITAL_BASED_OUTPATIENT_CLINIC_OR_DEPARTMENT_OTHER)
Admission: RE | Admit: 2023-01-08 | Discharge: 2023-01-08 | Disposition: A | Discharge status: Home / Self Care | Payer: Federal, State, Local not specified - PPO | Attending: Surgery | Admitting: Surgery

## 2023-01-08 ENCOUNTER — Encounter (HOSPITAL_BASED_OUTPATIENT_CLINIC_OR_DEPARTMENT_OTHER)
Admission: RE | Disposition: A | Discharge status: Home / Self Care | Payer: Self-pay | Source: Home / Self Care | Attending: Surgery

## 2023-01-08 ENCOUNTER — Encounter (HOSPITAL_BASED_OUTPATIENT_CLINIC_OR_DEPARTMENT_OTHER): Payer: Self-pay | Admitting: Surgery

## 2023-01-08 DIAGNOSIS — I2510 Atherosclerotic heart disease of native coronary artery without angina pectoris: Secondary | ICD-10-CM | POA: Insufficient documentation

## 2023-01-08 DIAGNOSIS — L7211 Pilar cyst: Secondary | ICD-10-CM | POA: Diagnosis not present

## 2023-01-08 DIAGNOSIS — I110 Hypertensive heart disease with heart failure: Secondary | ICD-10-CM | POA: Diagnosis not present

## 2023-01-08 DIAGNOSIS — Z01818 Encounter for other preprocedural examination: Secondary | ICD-10-CM

## 2023-01-08 DIAGNOSIS — I509 Heart failure, unspecified: Secondary | ICD-10-CM | POA: Diagnosis not present

## 2023-01-08 DIAGNOSIS — E7800 Pure hypercholesterolemia, unspecified: Secondary | ICD-10-CM | POA: Insufficient documentation

## 2023-01-08 DIAGNOSIS — R220 Localized swelling, mass and lump, head: Secondary | ICD-10-CM | POA: Diagnosis not present

## 2023-01-08 DIAGNOSIS — I340 Nonrheumatic mitral (valve) insufficiency: Secondary | ICD-10-CM | POA: Diagnosis not present

## 2023-01-08 DIAGNOSIS — I252 Old myocardial infarction: Secondary | ICD-10-CM | POA: Insufficient documentation

## 2023-01-08 DIAGNOSIS — Z951 Presence of aortocoronary bypass graft: Secondary | ICD-10-CM | POA: Diagnosis not present

## 2023-01-08 DIAGNOSIS — E119 Type 2 diabetes mellitus without complications: Secondary | ICD-10-CM

## 2023-01-08 DIAGNOSIS — I429 Cardiomyopathy, unspecified: Secondary | ICD-10-CM | POA: Diagnosis not present

## 2023-01-08 DIAGNOSIS — L7212 Trichodermal cyst: Secondary | ICD-10-CM | POA: Diagnosis not present

## 2023-01-08 HISTORY — PX: MASS EXCISION: SHX2000

## 2023-01-08 LAB — POCT I-STAT, CHEM 8
BUN: 21 mg/dL — ABNORMAL HIGH (ref 6–20)
Calcium, Ion: 1.26 mmol/L (ref 1.15–1.40)
Chloride: 103 mmol/L (ref 98–111)
Creatinine, Ser: 0.6 mg/dL (ref 0.44–1.00)
Glucose, Bld: 289 mg/dL — ABNORMAL HIGH (ref 70–99)
HCT: 36 % (ref 36.0–46.0)
Hemoglobin: 12.2 g/dL (ref 12.0–15.0)
Potassium: 4.2 mmol/L (ref 3.5–5.1)
Sodium: 137 mmol/L (ref 135–145)
TCO2: 22 mmol/L (ref 22–32)

## 2023-01-08 LAB — GLUCOSE, CAPILLARY: Glucose-Capillary: 286 mg/dL — ABNORMAL HIGH (ref 70–99)

## 2023-01-08 SURGERY — EXCISION MASS
Anesthesia: General

## 2023-01-08 MED ORDER — ROCURONIUM BROMIDE 10 MG/ML (PF) SYRINGE
PREFILLED_SYRINGE | INTRAVENOUS | Status: DC | PRN
  Administered 2023-01-08: 40 mg via INTRAVENOUS

## 2023-01-08 MED ORDER — FENTANYL CITRATE (PF) 100 MCG/2ML IJ SOLN: 25.0000 ug | INTRAMUSCULAR | Status: DC | PRN

## 2023-01-08 MED ORDER — CHLORHEXIDINE GLUCONATE CLOTH 2 % EX PADS: 6.0000 | MEDICATED_PAD | Freq: Once | CUTANEOUS | Status: DC

## 2023-01-08 MED ORDER — 0.9 % SODIUM CHLORIDE (POUR BTL) OPTIME
TOPICAL | Status: DC | PRN
  Administered 2023-01-08: 500 mL

## 2023-01-08 MED ORDER — LACTATED RINGERS IV SOLN: INTRAVENOUS | Status: DC

## 2023-01-08 MED ORDER — BUPIVACAINE LIPOSOME 1.3 % IJ SUSP
INTRAMUSCULAR | Status: DC | PRN
  Administered 2023-01-08: 20 mL

## 2023-01-08 MED ORDER — GABAPENTIN 300 MG PO CAPS
ORAL_CAPSULE | ORAL | Status: AC
  Filled 2023-01-08: qty 1

## 2023-01-08 MED ORDER — MIDAZOLAM HCL 2 MG/2ML IJ SOLN
INTRAMUSCULAR | Status: AC
  Filled 2023-01-08: qty 2

## 2023-01-08 MED ORDER — LIDOCAINE 2% (20 MG/ML) 5 ML SYRINGE
INTRAMUSCULAR | Status: DC | PRN
  Administered 2023-01-08: 60 mg via INTRAVENOUS

## 2023-01-08 MED ORDER — PROPOFOL 10 MG/ML IV BOLUS
INTRAVENOUS | Status: AC
  Filled 2023-01-08: qty 20

## 2023-01-08 MED ORDER — MIDAZOLAM HCL 2 MG/2ML IJ SOLN
INTRAMUSCULAR | Status: DC | PRN
  Administered 2023-01-08: 2 mg via INTRAVENOUS

## 2023-01-08 MED ORDER — SUGAMMADEX SODIUM 200 MG/2ML IV SOLN
INTRAVENOUS | Status: DC | PRN
  Administered 2023-01-08: 120 mg via INTRAVENOUS

## 2023-01-08 MED ORDER — PROPOFOL 10 MG/ML IV BOLUS
INTRAVENOUS | Status: DC | PRN
Start: 2023-01-08 — End: 2023-01-08
  Administered 2023-01-08: 100 mg via INTRAVENOUS

## 2023-01-08 MED ORDER — EPHEDRINE SULFATE-NACL 50-0.9 MG/10ML-% IV SOSY
PREFILLED_SYRINGE | INTRAVENOUS | Status: DC | PRN
  Administered 2023-01-08 (×2): 5 mg via INTRAVENOUS

## 2023-01-08 MED ORDER — ACETAMINOPHEN 500 MG PO TABS
1000.0000 mg | ORAL_TABLET | ORAL | Status: AC
  Administered 2023-01-08: 1000 mg via ORAL

## 2023-01-08 MED ORDER — ACETAMINOPHEN 500 MG PO TABS
ORAL_TABLET | ORAL | Status: AC
  Filled 2023-01-08: qty 2

## 2023-01-08 MED ORDER — BUPIVACAINE-EPINEPHRINE 0.25% -1:200000 IJ SOLN
INTRAMUSCULAR | Status: DC | PRN
  Administered 2023-01-08: 30 mL

## 2023-01-08 MED ORDER — FENTANYL CITRATE (PF) 100 MCG/2ML IJ SOLN
INTRAMUSCULAR | Status: AC
  Filled 2023-01-08: qty 2

## 2023-01-08 MED ORDER — GABAPENTIN 300 MG PO CAPS
300.0000 mg | ORAL_CAPSULE | ORAL | Status: AC
  Administered 2023-01-08: 300 mg via ORAL

## 2023-01-08 MED ORDER — TRAMADOL HCL 50 MG PO TABS: 50.0000 mg | ORAL_TABLET | Freq: Four times a day (QID) | ORAL | 0 refills | Status: DC | PRN

## 2023-01-08 MED ORDER — DIBUCAINE 1 % EX OINT
TOPICAL_OINTMENT | CUTANEOUS | Status: DC | PRN
  Administered 2023-01-08: 1

## 2023-01-08 MED ORDER — ONDANSETRON HCL 4 MG/2ML IJ SOLN
INTRAMUSCULAR | Status: DC | PRN
Start: 2023-01-08 — End: 2023-01-08
  Administered 2023-01-08: 4 mg via INTRAVENOUS

## 2023-01-08 MED ORDER — PHENYLEPHRINE 80 MCG/ML (10ML) SYRINGE FOR IV PUSH (FOR BLOOD PRESSURE SUPPORT)
PREFILLED_SYRINGE | INTRAVENOUS | Status: DC | PRN
  Administered 2023-01-08: 80 ug via INTRAVENOUS
  Administered 2023-01-08: 160 ug via INTRAVENOUS

## 2023-01-08 MED ORDER — FENTANYL CITRATE (PF) 250 MCG/5ML IJ SOLN
INTRAMUSCULAR | Status: DC | PRN
  Administered 2023-01-08 (×2): 50 ug via INTRAVENOUS

## 2023-01-08 SURGICAL SUPPLY — 45 items
APL SKNCLS STERI-STRIP NONHPOA (GAUZE/BANDAGES/DRESSINGS)
APPLIER CLIP 11 MED OPEN (CLIP)
APR CLP MED 11 20 MLT OPN (CLIP)
BENZOIN TINCTURE PRP APPL 2/3 (GAUZE/BANDAGES/DRESSINGS) IMPLANT
BLADE CLIPPER SENSICLIP SURGIC (BLADE) IMPLANT
BLADE SURG 10 STRL SS (BLADE) ×1 IMPLANT
BLADE SURG 15 STRL LF DISP TIS (BLADE) ×1 IMPLANT
BLADE SURG 15 STRL SS (BLADE) ×1
BNDG GAUZE DERMACEA FLUFF 4 (GAUZE/BANDAGES/DRESSINGS) IMPLANT
BNDG GZE DERMACEA 4 6PLY (GAUZE/BANDAGES/DRESSINGS)
CLIP APPLIE 11 MED OPEN (CLIP) IMPLANT
CLOTH BEACON ORANGE TIMEOUT ST (SAFETY) ×1 IMPLANT
COVER BACK TABLE 60X90IN (DRAPES) ×1 IMPLANT
COVER MAYO STAND STRL (DRAPES) ×1 IMPLANT
DRAPE LAPAROTOMY 100X72 PEDS (DRAPES) IMPLANT
DRAPE LAPAROTOMY TRNSV 102X78 (DRAPES) IMPLANT
DRAPE UTILITY XL STRL (DRAPES) IMPLANT
ELECT REM PT RETURN 9FT ADLT (ELECTROSURGICAL) ×1
ELECTRODE REM PT RTRN 9FT ADLT (ELECTROSURGICAL) ×1 IMPLANT
GAUZE 4X4 16PLY ~~LOC~~+RFID DBL (SPONGE) ×1 IMPLANT
GAUZE SPONGE 4X4 12PLY STRL (GAUZE/BANDAGES/DRESSINGS) IMPLANT
GLOVE ECLIPSE 8.0 STRL XLNG CF (GLOVE) ×1 IMPLANT
GLOVE INDICATOR 8.0 STRL GRN (GLOVE) ×1 IMPLANT
KIT TURNOVER CYSTO (KITS) ×1 IMPLANT
MANIFOLD NEPTUNE II (INSTRUMENTS) IMPLANT
NDL HYPO 22X1.5 SAFETY MO (MISCELLANEOUS) ×1 IMPLANT
NEEDLE HYPO 22X1.5 SAFETY MO (MISCELLANEOUS) ×1
NS IRRIG 500ML POUR BTL (IV SOLUTION) ×1 IMPLANT
PACK BASIN DAY SURGERY FS (CUSTOM PROCEDURE TRAY) ×1 IMPLANT
PENCIL SMOKE EVACUATOR (MISCELLANEOUS) ×1 IMPLANT
SLEEVE SCD COMPRESS KNEE MED (STOCKING) ×1 IMPLANT
SPONGE T-LAP 4X18 ~~LOC~~+RFID (SPONGE) IMPLANT
STRIP CLOSURE SKIN 1/2X4 (GAUZE/BANDAGES/DRESSINGS) IMPLANT
SUT MNCRL AB 4-0 PS2 18 (SUTURE) IMPLANT
SUT PROLENE 2 0 CT2 30 (SUTURE) IMPLANT
SUT VIC AB 3-0 SH 18 (SUTURE) IMPLANT
SUT VIC AB 3-0 SH 27 (SUTURE) ×1
SUT VIC AB 3-0 SH 27X BRD (SUTURE) IMPLANT
SUT VICRYL 2 0 18 UND BR (SUTURE) IMPLANT
SYR CONTROL 10ML LL (SYRINGE) ×1 IMPLANT
TOWEL OR 17X24 6PK STRL BLUE (TOWEL DISPOSABLE) ×2 IMPLANT
TRAY DSU PREP LF (CUSTOM PROCEDURE TRAY) ×1 IMPLANT
TUBE CONNECTING 12X1/4 (SUCTIONS) IMPLANT
WATER STERILE IRR 500ML POUR (IV SOLUTION) ×1 IMPLANT
YANKAUER SUCT BULB TIP NO VENT (SUCTIONS) IMPLANT

## 2023-01-08 NOTE — H&P (Signed)
01/08/2023   REFERRING PHYSICIAN: Drinda Butts,*  Patient Care Team: Daisy Floro, MD as PCP - General (Family Medicine)  PROVIDER: Jarrett Soho, MD  [facility] MRN: [id number] DOB: 1970-09-03  SUBJECTIVE   Chief Complaint: Scalp Cyst   [name] is a 52 y.o. female  who is seen today as an office consultation  at the request of Dr.. Lykins  for evaluation of scalp masses   History of Present Illness:  52 year old woman. I had repair bilateral inguinal and femoral hernias on her 01/22/2016. Apparently there are concerns of some scalp cysts and she wished to see me to discuss it. PCP sent evaluation request. She notes that it been there a while. She recalls me removing one of them in the office may be a decade ago. I cannot find records of that which we are trying to get. The largest one is on the top. She believes that someone became recurrent. She has 2 new nodules. The most painful/sensitive one is on her lower scalp. Hard to lay her head on it. She did have a thyroid lobectomy in 2010 by Dr. Daphine Deutscher with our group. No thyroid cancer. No fevers or chills or fall or trauma. No diabetes or smoking. No recurrent inguinal hernias. She did have an episode of chest pain found to have some coronary disease in 2018. Had a mildly decreased ejection fraction of 45%. Had some hypercholesterolemia but is on subcutaneous medication for that and that is markedly improved. She has great performance status. Visibly she is healthy and active.  Medical History:  Past Medical History:  Diagnosis Date  Anemia  CHF (congestive heart failure) (CMS/HHS-HCC)  Heart attack (CMS/HHS-HCC)   Patient Active Problem List  Diagnosis  Scalp mass  Cardiomyopathy (CMS/HHS-HCC)   Past Surgical History:  Procedure Laterality Date  goiter removal  HERNIA REPAIR  HYSTERECTOMY  open heart surgery  partial hysterectomy    Allergies  Allergen Reactions  Statins-Hmg-Coa Reductase  Inhibitors Other (See Comments)  Muscle aches with higher doses of: Crestor & Lipitor  Sulfamethoxazole-Trimethoprim Itching and Rash   Current Outpatient Medications on File Prior to Visit  Medication Sig Dispense Refill  aspirin 81 MG EC tablet Take 81 mg by mouth once daily  co-enzyme Q-10, ubiquinone, 200 mg capsule 1 capsule with a meal  ezetimibe (ZETIA) 10 mg tablet Take 10 mg by mouth once daily  isosorbide mononitrate (IMDUR) 30 MG ER tablet Take 0.5 tablets by mouth once daily  losartan (COZAAR) 25 MG tablet Take 1 tablet by mouth once daily  metoprolol succinate (TOPROL-XL) 25 MG XL tablet Take by mouth  NEXLETOL 180 mg Tab Take 180 mg by mouth once daily  REPATHA SURECLICK 140 mg/mL PnIj INJECT THE CONTENTS OF ONE PEN UNDER THE SKIN EVERY 14 DAYS  spironolactone (ALDACTONE) 25 MG tablet Take 1 tablet by mouth once daily  methocarbamoL (ROBAXIN) 500 MG tablet (Patient not taking: Reported on 11/11/2022)  nitroGLYcerin (NITROSTAT) 0.4 MG SL tablet Place under the tongue (Patient not taking: Reported on 11/11/2022)   No current facility-administered medications on file prior to visit.   History reviewed. No pertinent family history.   Social History   Tobacco Use  Smoking Status Never  Smokeless Tobacco Never    Social History   Socioeconomic History  Marital status: Married  Tobacco Use  Smoking status: Never  Smokeless tobacco: Never  Vaping Use  Vaping status: Never Used  Substance and Sexual Activity  Alcohol use: Not Currently  Drug use:  Never  Sexual activity: Defer   Social Determinants of Health   Physical Activity: High Risk (07/24/2022)  [facility]  PCARE Exercise SDOH  Exercise: Indicated   ############################################################  Review of Systems: A complete review of systems (ROS) was obtained from the patient.  We have reviewed this information and discussed as  appropriate with the patient.  See HPI as well for other pertinent ROS.  Constitutional: No fevers, chills, sweats. Weight stable Eyes: No vision changes, No discharge HENT: No sore throats, nasal drainage Lymph: No neck swelling, No bruising easily Pulmonary: No cough, productive sputum CV: No orthopnea, PND . No exertional chest/neck/shoulder/arm pain. Patient can walk 20 minutes .   GI: No personal nor family history of GI/colon cancer, inflammatory bowel disease, irritable bowel syndrome, allergy such as Celiac Sprue, dietary/dairy problems, colitis, ulcers nor gastritis. No recent sick contacts/gastroenteritis. No travel outside the country. No changes in diet.  Renal: No UTIs, No hematuria Genital: No drainage, bleeding, masses Musculoskeletal: No severe joint pain. Good ROM major joints Skin: No sores or lesions Heme/Lymph: No easy bleeding. No swollen lymph nodes Neuro: No active seizures. No facial droop Psych: No hallucinations. No agitation  OBJECTIVE   Vitals:  11/11/22 0944  BP: 124/70  Pulse: 88  Weight: 57.2 kg (126 lb)  Height: 152.4 cm (5')   Body mass index is 24.61 kg/m.  PHYSICAL EXAM:  Constitutional: Not cachectic. Hygeine adequate. Vitals signs as above.  Eyes: No glasses. Vision adequate,Pupils reactive, normal extraocular movements. Sclera nonicteric Neuro: CN II-XII intact. No major focal sensory defects. No major motor deficits. Lymph: No head/neck/groin lymphadenopathy Psych: No severe agitation. No severe anxiety. Judgment & insight Adequate, Oriented x4,  HENT: Right vertex is a 20x41mm firm fixed but smooth mass most likely consistent with a cyst.  In the upper posterior scalp is a 7 x 7 mm dermal nodule.  In the upper neck on the right side is a 6 x 5 mm dermal nodule  Mucus membranes moist. No thrush. Hearing: adequate Neck: Supple, No tracheal deviation. No obvious thyromegaly Chest: No pain to chest wall compression. Good respiratory  excursion. No audible wheezing CV: Pulses intact. regular. No major extremity edema Ext: No obvious deformity or contracture. Edema: Not present. No cyanosis Skin: No major subcutaneous nodules. Warm and dry Musculoskeletal: Severe joint rigidity not present. No obvious clubbing. No digital petechiae. Mobility: no assist device moving easily without restrictions Abdomen: Flat Soft. Nondistended. Nontender. Hernia: Not present. Diastasis recti: Not present. No hepatomegaly. No splenomegaly. Genital/Pelvic: Inguinal hernia: Not present. Inguinal lymph nodes: without lymphadenopathy nor hidradenitis.  Rectal: (Deferred)    ###################################################################  Labs, Imaging and Diagnostic Testing:  Located in 'Care Everywhere' section of Epic EMR chart  PRIOR [facility] CLINIC NOTES:  Located in 'Care Everywhere' section of Epic EMR chart  SURGERY NOTES:  Located in 'Care Everywhere' section of Epic EMR chart  PATHOLOGY:  Located in 'Care Everywhere' section of Epic EMR chart  Assessment and Plan:  DIAGNOSES:  Diagnoses and all orders for this visit:  Scalp mass  Cardiomyopathy, unspecified type (CMS/HHS-HCC)    ASSESSMENT/PLAN  Pleasant active woman with history of scalp masses reportedly excised a decade ago with 1 recurrence and 2 new areas. Some pain and discomfort and increasing size.  Reasonable to try and remove. Given 1 is recurrent and these are fixed, I recommended surgery under at least some deep sedation if not general anesthesia/LMA so that I can  do full-thickness excisions if needed with good hemostatic control. Should be an outpatient surgery. The pathophysiology of skin & subcutaneous masses was discussed. Natural history risks without surgery were discussed. I recommended surgery to remove the mass. I explained the technique of removal with use of local anesthesia & possible need for more aggressive sedation/anesthesia for patient  comfort.   Risks such as bleeding, infection, wound breakdown, heart attack, death, and other risks were discussed. I noted a good likelihood this will help address the problem. Possibility that this will not correct all symptoms was explained. Possibility of regrowth/recurrence of the mass was discussed. We will work to minimize complications. Questions were answered. The patient expresses understanding & wishes to proceed with surgery.  She did have a distant history of some coronary disease 8 years ago but has much better cholesterol control & great performance status.   Ardeth Sportsman, MD, FACS, MASCRS Esophageal, Gastrointestinal & Colorectal Surgery Robotic and Minimally Invasive Surgery  [facility] [address], [address] [place], [place] [address] [phone] Fax [phone] Main  CONTACT INFORMATION: Weekday (9AM-5PM): Call [facility] main office at [phone] Weeknight (5PM-9AM) or Weekend/Holiday: Check EPIC "Web Links" tab & use "AMION" (password " [email or web address]") for General Surgery [facility] coverage  Please, DO NOT use SecureChat  (it is not reliable communication to reach operating surgeons & will lead to a delay in care).   Epic staff messaging available for outptient concerns needing 1-2 business day response.     01/08/2023

## 2023-01-08 NOTE — Transfer of Care (Signed)
Immediate Anesthesia Transfer of Care Note  Patient: [name]  Procedure(s) Performed: REMOVAL OF SUBCUTANEOUS MASSES ON SCALP  Patient Location: PACU  Anesthesia Type:General  Level of Consciousness: awake, alert , and oriented  Airway & Oxygen Therapy: Patient Spontanous Breathing  Post-op Assessment: Report given to RN and Post -op Vital signs reviewed and stable  Post vital signs: Reviewed and stable  Last Vitals:  Vitals Value Taken Time  BP 132/75 01/08/23 1142  Temp    Pulse 78 01/08/23 1143  Resp 9 01/08/23 1143  SpO2 95 % 01/08/23 1143  Vitals shown include unfiled device data.  Last Pain:  Vitals:   01/08/23 0828  TempSrc: Oral  PainSc: 0-No pain      Patients Stated Pain Goal: 6 (01/08/23 1610)  Complications: No notable events documented.

## 2023-01-08 NOTE — Anesthesia Procedure Notes (Signed)
Procedure Name: Intubation Date/Time: 01/08/2023 10:16 AM  Performed by: Dairl Ponder, CRNAPre-anesthesia Checklist: Patient identified, Emergency Drugs available, Suction available and Patient being monitored Patient Re-evaluated:Patient Re-evaluated prior to induction Oxygen Delivery Method: Circle System Utilized Preoxygenation: Pre-oxygenation with 100% oxygen Induction Type: IV induction Ventilation: Mask ventilation without difficulty Laryngoscope Size: Glidescope and 3 (HX of difficult airway) Grade View: Grade II Tube type: Oral Tube size: 7.0 mm Number of attempts: 1 Airway Equipment and Method: Stylet and Oral airway Placement Confirmation: ETT inserted through vocal cords under direct vision, positive ETCO2 and breath sounds checked- equal and bilateral Secured at: 19 cm Tube secured with: Tape Dental Injury: Teeth and Oropharynx as per pre-operative assessment

## 2023-01-08 NOTE — Interval H&P Note (Signed)
History and Physical Interval Note:  01/08/2023 9:15 AM  [name]  has presented today for surgery, with the diagnosis of SCALP SUBCUTANEOUS MASSES.  The various methods of treatment have been discussed with the patient and family. After consideration of risks, benefits and other options for treatment, the patient has consented to  Procedure(s) with comments: REMOVAL OF SUBCUTANEOUS MASS ONSCALP (N/A) - GEN/LMA as a surgical intervention.  The patient's history has been reviewed, patient examined, no change in status, stable for surgery.  I have reviewed the patient's chart and labs.  Questions were answered to the patient's satisfaction.    I have re-reviewed the the patient's records, history, medications, and allergies.  I have re-examined the patient.  I again discussed intraoperative plans and goals of post-operative recovery.  The patient agrees to proceed.  [name]  11-20-1970 [id number]  Patient Care Team: Daisy Floro, MD as PCP - General (Family Medicine) Runell Gess, MD as PCP - Cardiology (Cardiology) Karie Soda, MD as Consulting Physician (General Surgery) Olivia Mackie, MD as Consulting Physician (Obstetrics and Gynecology)  Patient Active Problem List   Diagnosis Date Noted   Abnormal nuclear stress test    Hypertension 09/02/2018   Chest pain of uncertain etiology 09/01/2018   Elevated liver enzymes 04/01/2018   CAD in native artery 04/24/2017   Fatigue due to treatment 04/03/2017   Ischemic cardiomyopathy 03/17/2017   Familial hyperlipidemia 12/26/2016   S/P CABG x 1 07/12/2016   NSTEMI (non-ST elevated myocardial infarction) (HCC) 07/11/2016    Past Medical History:  Diagnosis Date   Coronary artery disease    Headache    Heart murmur    aschild antibiotics if having Dental work   Hypocholesteremia    medical treatment   Ischemic cardiomyopathy    ef 40 to 45 %with global hypokinesia and apical aneurysm formation per 2D echo 06-13-2021    Menorrhagia    NSTEMI (non-ST elevated myocardial infarction) (HCC) 07/12/2016   Pelvic pain     Past Surgical History:  Procedure Laterality Date   ABDOMINAL HYSTERECTOMY     yrs ago   CORONARY ARTERY BYPASS GRAFT N/A 07/12/2016   Procedure: CORONARY ARTERY BYPASS GRAFTING (CABG), ON PUMP, TIMES ONE, USING LEFT INTERNAL MAMMARY ARTERY WITH TEE;  Surgeon: Alleen Borne, MD;  Location: [facility] OR;  Service: Open Heart Surgery;  Laterality: N/A;  LIMA to LAD   goiter     left side goiter removed   INGUINAL HERNIA REPAIR Bilateral 01/22/2016   Procedure: LAPAROSCOPIC BILATERAL FEMORAL AND RIGHT INGUINAL HERNIA REPAIRWITH INSERTION OF MESH;  Surgeon: Karie Soda, MD;  Location: [facility] ORS;  Service: General;  Laterality: Bilateral;   LEFT HEART CATH AND CORONARY ANGIOGRAPHY N/A 07/12/2016   Procedure: Left Heart Cath and Coronary Angiography;  Surgeon: Runell Gess, MD;  Location: [facility] INVASIVE CV LAB;  Service: Cardiovascular;  Laterality: N/A;   LEFT HEART CATH AND CORS/GRAFTS ANGIOGRAPHY N/A 03/08/2020   Procedure: LEFT HEART CATH AND CORS/GRAFTS ANGIOGRAPHY;  Surgeon: Runell Gess, MD;  Location: [facility] INVASIVE CV LAB;  Service: Cardiovascular;  Laterality: N/A;   SVD     x 1   WISDOM TOOTH EXTRACTION      Social History   Socioeconomic History   Marital status: Married    Spouse name: Not on file   Number of children: Not on file   Years of education: Not on file   Highest education level: Not on file  Occupational History   Not  on file  Tobacco Use   Smoking status: Never   Smokeless tobacco: Never  Vaping Use   Vaping status: Never Used  Substance and Sexual Activity   Alcohol use: No   Drug use: No   Sexual activity: Yes    Birth control/protection: None  Other Topics Concern   Not on file  Social History Narrative   Not on file   Social Determinants of Health   Financial Resource Strain: Not on file  Food Insecurity: No Food Insecurity (11/25/2022)   Hunger  Vital Sign    Worried About Running Out of Food in the Last Year: Never true    Ran Out of Food in the Last Year: Never true  Transportation Needs: No Transportation Needs (11/25/2022)   PRAPARE - Administrator, Civil Service (Medical): No    Lack of Transportation (Non-Medical): No  Physical Activity: Insufficiently Active (11/25/2022)   Exercise Vital Sign    Days of Exercise per Week: 5 days    Minutes of Exercise per Session: 20 min  Stress: Not on file  Social Connections: Not on file  Intimate Partner Violence: Not on file    Family History  Adopted: Yes    Medications Prior to Admission  Medication Sig Dispense Refill Last Dose   aspirin 81 MG tablet Take 1 tablet (81 mg total) by mouth daily.   01/07/2023   Coenzyme Q10 (CO Q-10) 200 MG CAPS 1 capsule with a meal   01/07/2023   ezetimibe (ZETIA) 10 MG tablet TAKE ONE TABLET BY MOUTH ONE TIME DAILY 90 tablet 3 01/07/2023   icosapent Ethyl (VASCEPA) 1 g capsule Take 2 capsules (2 g total) by mouth 2 (two) times daily. 360 capsule 3 01/07/2023   isosorbide mononitrate (IMDUR) 30 MG 24 hr tablet TAKE ONE-HALF TABLET BY MOUTH ONE TIME DAILY 45 tablet 6 01/08/2023 at 0700   losartan (COZAAR) 25 MG tablet TAKE ONE TABLET BY MOUTH ONE TIME DAILY 90 tablet 3 01/07/2023   metoprolol succinate (TOPROL-XL) 25 MG 24 hr tablet TAKE ONE TABLET BY MOUTH ONE TIME DAILY (Patient taking differently: Take 25 mg by mouth every evening.) 90 tablet 3 01/07/2023 at 2200   NEXLETOL 180 MG TABS TAKE 1 TABLET BY MOUTH EVERY DAY (Patient taking differently: Take 1 tablet by mouth every evening.) 90 tablet 3 01/07/2023   spironolactone (ALDACTONE) 25 MG tablet TAKE ONE TABLET BY MOUTH ONE TIME DAILY 90 tablet 3 01/07/2023   Evolocumab (REPATHA SURECLICK) 140 MG/ML SOAJ INJECT THE CONTENTS OF ONE PEN UNDER THE SKIN EVERY 14 DAYS 6 mL 3 12/26/2022   nitroGLYCERIN (NITROSTAT) 0.4 MG SL tablet Place 1 tablet (0.4 mg total) under the tongue every 5 (five) minutes  x 3 doses as needed for chest pain. 30 tablet 6 More than a month    Current Facility-Administered Medications  Medication Dose Route Frequency Provider Last Rate Last Admin   Chlorhexidine Gluconate Cloth 2 % PADS 6 each  6 each Topical Once Karie Soda, MD       And   Chlorhexidine Gluconate Cloth 2 % PADS 6 each  6 each Topical Once Karie Soda, MD       lactated ringers infusion   Intravenous Continuous Shelton Silvas, MD 50 mL/hr at 01/08/23 0842 New Bag at 01/08/23 0842     Allergies  Allergen Reactions   Statins Other (See Comments)    Muscle aches with higher doses of: Crestor & Lipitor   Bactrim Itching  and Rash         BP 102/67   Pulse (!) 59   Temp 97.7 F (36.5 C) (Oral)   Resp 17   Ht 5' (1.524 m)   Wt 57.4 kg   LMP 08/10/2011   SpO2 99%   BMI 24.72 kg/m   Labs: Results for orders placed or performed during the hospital encounter of 01/08/23 (from the past 48 hour(s))  I-STAT, chem 8     Status: Abnormal   Collection Time: 01/08/23  8:43 AM  Result Value Ref Range   Sodium 137 135 - 145 mmol/L   Potassium 4.2 3.5 - 5.1 mmol/L   Chloride 103 98 - 111 mmol/L   BUN 21 (H) 6 - 20 mg/dL   Creatinine, Ser 1.61 0.44 - 1.00 mg/dL   Glucose, Bld 096 (H) 70 - 99 mg/dL    Comment: Glucose reference range applies only to samples taken after fasting for at least 8 hours.   Calcium, Ion 1.26 1.15 - 1.40 mmol/L   TCO2 22 22 - 32 mmol/L   Hemoglobin 12.2 12.0 - 15.0 g/dL   HCT 04.5 40.9 - 81.1 %    Imaging / Studies: No results found.   Ardeth Sportsman, M.D., F.A.C.S. Gastrointestinal and Minimally Invasive Surgery [facility] [address], [address] [place], [place] [address] [phone] Main / Paging  01/08/2023 9:15 AM    Ardeth Sportsman

## 2023-01-08 NOTE — Discharge Instructions (Addendum)
Your sugars are very elevated consistent with diabetes.  Please follow-up with primary care physician to have discussion about starting medication and management to prevent future long-term problems.  #######################################################  GENERAL SURGERY: POST OP INSTRUCTIONS  ######################################################################  EAT Gradually transition to a high fiber diet with a fiber supplement over the next few weeks after discharge.  Start with a pureed / full liquid diet (see below)  WALK Walk an hour a day.  Control your pain to do that.    CONTROL PAIN Control pain so that you can walk, sleep, tolerate sneezing/coughing, go up/down stairs.  HAVE A BOWEL MOVEMENT DAILY Keep your bowels regular to avoid problems.  OK to try a laxative to override constipation.  OK to use an antidairrheal to slow down diarrhea.  Call if not better after 2 tries  CALL IF YOU HAVE PROBLEMS/CONCERNS Call if you are still struggling despite following these instructions. Call if you have concerns not answered by these instructions  ######################################################################    DIET: Follow a light bland diet & liquids the first 24 hours after arrival home, such as soup, liquids, starches, etc.  Be sure to drink plenty of fluids.  Quickly advance to a usual solid diet within a few days.  Avoid fast food or heavy meals as your are more likely to get nauseated or have irregular bowels.  A low-fat, high-fiber diet for the rest of your life is ideal.    Take your usually prescribed home medications unless otherwise directed.  PAIN CONTROL: Pain is best controlled by a usual combination of three different methods TOGETHER: Ice/Heat Over the counter pain medication Prescription pain medication Most patients will experience some swelling and bruising around the incisions.  Ice packs or heating pads (30-60 minutes up to 6 times a day)  will help. Use ice for the first few days to help decrease swelling and bruising, then switch to heat to help relax tight/sore spots and speed recovery.  Some people prefer to use ice alone, heat alone, alternating between ice & heat.  Experiment to what works for you.  Swelling and bruising can take several weeks to resolve.   It is helpful to take an over-the-counter pain medication regularly for the first few weeks.  Choose one of the following that works best for you: Naproxen (Aleve, etc)  Two 220mg  tabs twice a day Ibuprofen (Advil, etc) Three 200mg  tabs four times a day (every meal & bedtime) Acetaminophen (Tylenol, etc) 500-650mg  four times a day (every meal & bedtime) A  prescription for pain medication (such as oxycodone, hydrocodone, etc) should be given to you upon discharge.  Take your pain medication as prescribed.  If you are having problems/concerns with the prescription medicine (does not control pain, nausea, vomiting, rash, itching, etc), please call us [phone] to see if we need to switch you to a different pain medicine that will work better for you and/or control your side effect better. If you need a refill on your pain medication, please contact your pharmacy.  They will contact our office to request authorization. Prescriptions will not be filled after 5 pm or on week-ends.  Avoid getting constipated.  Between the surgery and the pain medications, it is common to experience some constipation.  Increasing fluid intake and taking a fiber supplement (such as Metamucil, Citrucel, FiberCon, MiraLax, etc) 1-2 times a day regularly will usually help prevent this problem from occurring.  A mild laxative (prune juice, Milk of Magnesia, MiraLax, etc) should be taken  according to package directions if there are no bowel movements after 48 hours.   Watch out for diarrhea.  If you have many loose bowel movements, simplify your diet to bland foods & liquids for a few days.  Stop any stool  softeners and decrease your fiber supplement.  Switching to mild anti-diarrheal medications (Loperamide/Imodium, Kayopectate, Pepto Bismol) can help.  If this worsens or does not improve, please call us.  Wash / shower every day.  You may shower over the incisions and shampoo as needed.  You may leave the incision open to air.    ACTIVITIES as tolerated:   You may resume regular (light) daily activities beginning the next day--such as daily self-care, walking, climbing stairs--gradually increasing activities as tolerated.  If you can walk 30 minutes without difficulty, it is safe to try more intense activity such as jogging, treadmill, bicycling, low-impact aerobics, swimming, etc. Save the most intensive and strenuous activity for last such as sit-ups, heavy lifting, contact sports, etc  Refrain from any heavy lifting or straining until you are off narcotics for pain control.   DO NOT PUSH THROUGH PAIN.  Let pain be your guide: If it hurts to do something, don't do it.  Pain is your body warning you to avoid that activity for another week until the pain goes down. You may drive when you are no longer taking prescription pain medication, you can comfortably wear a seatbelt, and you can safely maneuver your car and apply brakes. You may have sexual intercourse when it is comfortable.   FOLLOW UP in our office Please call [facility] at [phone] to set up an appointment to see your surgeon in the office for a follow-up appointment approximately 2-3 weeks after your surgery. Make sure that you call for this appointment the day you arrive home to insure a convenient appointment time.  9. IF YOU HAVE DISABILITY OR FAMILY LEAVE FORMS, BRING THEM TO THE OFFICE FOR PROCESSING.  DO NOT GIVE THEM TO YOUR DOCTOR.   WHEN TO CALL us [phone]: Poor pain control Reactions / problems with new medications (rash/itching, nausea, etc)  Fever over 101.5 F (38.5 C) Worsening swelling or bruising Continued  bleeding from incision. Increased pain, redness, or drainage from the incision Difficulty breathing / swallowing   The clinic staff is available to answer your questions during regular business hours (8:30am-5pm).  Please don't hesitate to call and ask to speak to one of our nurses for clinical concerns.   If you have a medical emergency, go to the nearest emergency room or call 911.  A surgeon from [facility] is always on call at the [facility], [place] [address], [place], [place]  [address] ? MAIN: [phone] ? TOLL FREE: [phone] ?  FAX [phone] [email or web address]  #######################################################     No acetaminophen/Tylenol until after 2:30pm today if needed for pain.    Information for Discharge Teaching: EXPAREL (bupivacaine liposome injectable suspension)   Pain relief is important to your recovery. The goal is to control your pain so you can move easier and return to your normal activities as soon as possible after your procedure. Your physician may use several types of medicines to manage pain, swelling, and more.  Your surgeon or anesthesiologist gave you EXPAREL(bupivacaine) to help control your pain after surgery.  EXPAREL is a local anesthetic designed to release slowly over an extended period of time to provide pain relief by numbing  the tissue around the surgical site. EXPAREL is designed to release pain medication over time and can control pain for up to 72 hours. Depending on how you respond to EXPAREL, you may require less pain medication during your recovery. EXPAREL can help reduce or eliminate the need for opioids during the first few days after surgery when pain relief is needed the most. EXPAREL is not an opioid and is not addictive. It does not cause sleepiness or sedation.   Important! A teal colored band has been placed on your arm with the date,  time and amount of EXPAREL you have received. Please leave this armband in place for the full 96 hours following administration, and then you may remove the band. If you return to the hospital for any reason within 96 hours following the administration of EXPAREL, the armband provides important information that your health care providers to know, and alerts them that you have received this anesthetic.    Possible side effects of EXPAREL: Temporary loss of sensation or ability to move in the area where medication was injected. Nausea, vomiting, constipation Rarely, numbness and tingling in your mouth or lips, lightheadedness, or anxiety may occur. Call your doctor right away if you think you may be experiencing any of these sensations, or if you have other questions regarding possible side effects.  Follow all other discharge instructions given to you by your surgeon or nurse. Eat a healthy diet and drink plenty of water or other fluids.  Band can be removed on Monday January 12, 2023.     Post Anesthesia Home Care Instructions  Activity: Get plenty of rest for the remainder of the day. A responsible individual must stay with you for 24 hours following the procedure.  For the next 24 hours, DO NOT: -Drive a car -Advertising copywriter -Drink alcoholic beverages -Take any medication unless instructed by your physician -Make any legal decisions or sign important papers.  Meals: Start with liquid foods such as gelatin or soup. Progress to regular foods as tolerated. Avoid greasy, spicy, heavy foods. If nausea and/or vomiting occur, drink only clear liquids until the nausea and/or vomiting subsides. Call your physician if vomiting continues.  Special Instructions/Symptoms: Your throat may feel dry or sore from the anesthesia or the breathing tube placed in your throat during surgery. If this causes discomfort, gargle with warm salt water. The discomfort should disappear within 24 hours.

## 2023-01-08 NOTE — Op Note (Signed)
01/08/2023  11:32 AM  PATIENT:  [name]  52 y.o. 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   (See anesthesia record)  Delay start of Pharmacological VTE agent (>24hrs) due to concerns of significant anemia, surgical blood loss, or risk of bleeding?:  no  DRAINS: (None)  SPECIMENS: Ellipsoid mostly contained subcutaneous nodules consistent with pilar cyst with dermal involvement. Right vertex 20 x 15 x 25 mm -dermal involvement and scar consistent with recurrence Left vertex 10 x 7 x 7 mm Left posterior scalp 7 x 7 x 7 mm Right posterior scalp 7 x 7 x 7 mm Right occipital scalp 10 x 10 x 7 mm   DISPOSITION OF SPECIMEN:  Pathology  COUNTS:  Sponge, needle, & instrument counts CORRECT  PLAN OF CARE: Discharge to home after PACU  PATIENT DISPOSITION:  PACU - hemodynamically stable.  INDICATION: Patient with history of scalp masses presumed to be benign pilar cysts status post off excisions in the past with return many years later with at least 1 moderate recurrence and numerous small ones.  Pain and discomfort with headaches.  Patient wished removal.  Given  recurrence and increase in size of masses, recommendation made for removal.  Given the numerous lesions with a larger recurrence from prior local anesthetic and excision, I recommended at least moderate deep sedation/general anesthesia to allow better excision.  Anesthesia team recommended general esthesia to allow prone positioning and airway security  The pathophysiology of skin & subcutaneous masses was discussed.  Natural history risks without surgery were discussed.  I recommended surgery to remove the mass.  I explained the technique of removal with use of local anesthesia & possible need for more aggressive sedation/anesthesia for patient comfort.    Risks such as bleeding, infection, wound breakdown, heart attack, death, and other risks were discussed.  I noted a good likelihood this will help address the problem.   Possibility that this will not correct all symptoms was explained. Possibility of regrowth/recurrence of the mass was discussed.  We will work to minimize complications. Questions were answered.  The patient expresses understanding & wishes to proceed with surgery.  OR FINDINGS: Numerous scalp masses as noted under "SPECIMEN" section above.  Same ellipsoid lobular benign pilar cyst masses.  Some dermal involvement = removal of ellipsoid discs of skin and dermis as well.    DESCRIPTION:    Informed consent was confirmed.  Patient underwent general anesthesia without difficulty.  Patient carefully positioned prone.  Hairs on the scalp around the 5 masses carefully trimmed and clicked in small patches.  Remaining hair carefully secured away patient area was prepped and draped in sterile fashion.  Surgical timeout confirmed our plan.   I made an transverse biconcave ellipsoid incisions through  the skin and dermis around each mass.  Came through the dermis and subcutaneous tissues to come around the mass circumferentially as well as deeply to remove the mass.  I assured hemostasis to good  result.  Wound closed with interrupted vertical mattress 4-0 Monocryl running subcuticular suture.  Patient extubated and sent to recovery room.   I discussed operative findings, updated the patient's status, discussed probable steps to recovery, and gave postoperative recommendations to the patient's spouse.  Recommendations were made.  Questions were answered.  He expressed understanding & appreciation.  Of note, patient had glucose above 200 anesthesia myself concerned that she truly has diabetes and not borderline hyperglycemia.  We strongly recommend she follow-up with her primary care physician for further workup such as A1c and see if she needs to be on oral hypoglycemics or insulin regimen.  She was not severely hyperglycemic, so anesthesia did not feel too strongly about aggressively treating today.  Patient expressed understanding appreciation.   Ardeth Sportsman, MD, FACS, MASCRS Gastrointestinal and Minimally Invasive Surgery      [address], [address] [place], [place] [address] [phone] Main / Paging [phone] Fax

## 2023-01-08 NOTE — Anesthesia Preprocedure Evaluation (Addendum)
Anesthesia Evaluation  Patient identified by MRN, date of birth, ID band Patient awake    Reviewed: Allergy & Precautions, NPO status , Patient's Chart, lab work & pertinent test results, reviewed documented beta blocker date and time   History of Anesthesia Complications (+) DIFFICULT AIRWAY and history of anesthetic complications (Grade 1 view with glidescope)  Airway Mallampati: III  TM Distance: >3 FB Neck ROM: Full  Mouth opening: Limited Mouth Opening  Dental no notable dental hx. (+) Teeth Intact, Dental Advisory Given   Pulmonary neg pulmonary ROS   Pulmonary exam normal breath sounds clear to auscultation       Cardiovascular hypertension, Pt. on home beta blockers and Pt. on medications + CAD, + Past MI and + CABG  Normal cardiovascular exam Rhythm:Regular Rate:Normal  TTE 2023 1. Left ventricular ejection fraction, by estimation, is 40 to 45%. The  left ventricle has mildly decreased function. The left ventricular apex is  aneurysmal with hypokinesis in the remaining myocardial wall segments.   2. Right ventricular systolic function is normal. The right ventricular  size is normal. Tricuspid regurgitation signal is inadequate for assessing  PA pressure.   3. The mitral valve is normal in structure. Mild mitral valve  regurgitation. No evidence of mitral stenosis.   4. The aortic valve is normal in structure. Aortic valve regurgitation is  not visualized. No aortic stenosis is present.   5. The inferior vena cava is normal in size with greater than 50%  respiratory variability, suggesting right atrial pressure of 3 mmHg.     Neuro/Psych  Headaches  negative psych ROS   GI/Hepatic negative GI ROS, Neg liver ROS,,,  Endo/Other  negative endocrine ROS    Renal/GU negative Renal ROS  negative genitourinary   Musculoskeletal negative musculoskeletal ROS (+)    Abdominal   Peds  Hematology negative hematology  ROS (+)   Anesthesia Other Findings   Reproductive/Obstetrics                             Anesthesia Physical Anesthesia Plan  ASA: 3  Anesthesia Plan: General   Post-op Pain Management: Tylenol PO (pre-op)*   Induction: Intravenous  PONV Risk Score and Plan: 3 and Midazolam, Dexamethasone and Ondansetron  Airway Management Planned: Oral ETT and Video Laryngoscope Planned  Additional Equipment:   Intra-op Plan:   Post-operative Plan: Extubation in OR  Informed Consent: I have reviewed the patients History and Physical, chart, labs and discussed the procedure including the risks, benefits and alternatives for the proposed anesthesia with the patient or authorized representative who has indicated his/her understanding and acceptance.     Dental advisory given  Plan Discussed with: CRNA  Anesthesia Plan Comments:        Anesthesia Quick Evaluation

## 2023-01-08 NOTE — Progress Notes (Addendum)
Patient blood glucose 286 in PACU. Dr. Armond Hang notified and stated that we would not treat with insulin and to reiterated to patient the importance of following up with primary care doctor on this matter. RN educated patient and advised follow up with PCP. Patient stated that she will have to find a new PCP due to recently moving.

## 2023-01-09 ENCOUNTER — Encounter (HOSPITAL_BASED_OUTPATIENT_CLINIC_OR_DEPARTMENT_OTHER): Payer: Self-pay | Admitting: Surgery

## 2023-01-09 ENCOUNTER — Encounter (HOSPITAL_BASED_OUTPATIENT_CLINIC_OR_DEPARTMENT_OTHER): Payer: Self-pay | Admitting: Internal Medicine

## 2023-01-09 NOTE — Anesthesia Postprocedure Evaluation (Signed)
Anesthesia Post Note  Patient: Shenai Binkley  Procedure(s) Performed: REMOVAL OF SUBCUTANEOUS MASSES ON SCALP     Patient location during evaluation: PACU Anesthesia Type: General Level of consciousness: awake and alert Pain management: pain level controlled Vital Signs Assessment: post-procedure vital signs reviewed and stable Respiratory status: spontaneous breathing, nonlabored ventilation, respiratory function stable and patient connected to nasal cannula oxygen Cardiovascular status: blood pressure returned to baseline and stable Postop Assessment: no apparent nausea or vomiting Anesthetic complications: no  No notable events documented.  Last Vitals:  Vitals:   01/08/23 1321 01/08/23 1352  BP: 113/67   Pulse: (!) 58   Resp: 14   Temp:  (!) 36.3 C  SpO2: 96%     Last Pain:  Vitals:   01/08/23 1321  TempSrc:   PainSc: 0-No pain                 Josephanthony Tindel L Pax Reasoner

## 2023-01-12 LAB — SURGICAL PATHOLOGY

## 2023-01-16 ENCOUNTER — Encounter: Payer: Self-pay | Admitting: Family Medicine

## 2023-01-16 ENCOUNTER — Ambulatory Visit: Payer: Federal, State, Local not specified - PPO | Admitting: Family Medicine

## 2023-01-16 VITALS — BP 122/82 | HR 70 | Temp 97.8°F | Ht <= 58 in | Wt 125.0 lb

## 2023-01-16 DIAGNOSIS — Z7984 Long term (current) use of oral hypoglycemic drugs: Secondary | ICD-10-CM

## 2023-01-16 DIAGNOSIS — R739 Hyperglycemia, unspecified: Secondary | ICD-10-CM

## 2023-01-16 DIAGNOSIS — E119 Type 2 diabetes mellitus without complications: Secondary | ICD-10-CM | POA: Diagnosis not present

## 2023-01-16 LAB — POCT GLYCOSYLATED HEMOGLOBIN (HGB A1C): Hemoglobin A1C: 10.1 % — AB (ref 4.0–5.6)

## 2023-01-16 MED ORDER — METFORMIN HCL 500 MG PO TABS
500.0000 mg | ORAL_TABLET | Freq: Two times a day (BID) | ORAL | 1 refills | Status: DC
Start: 2023-01-16 — End: 2023-08-25

## 2023-01-16 NOTE — Patient Instructions (Addendum)
Go to the lab on the way out.   If you have mychart we'll likely use that to update you.    Start metformin.  1 pill a day for about 1 week.  Gradually increase up to 2 tabs twice a day if tolerated.  Update me about your sugar in about 1-2 weeks, sooner if needed.  Try using the eat right diet in the meantime.  Take care.  Glad to see you. Plan on recheck in about 3 months. A1c at the visit.

## 2023-01-16 NOTE — Progress Notes (Unsigned)
She has surgery f/u pending.  Benign path d/w pt.    Higher sugar with recent surgery, pre op eval.  D/w pt about DM2 dx.  H/o GDM.  Diet and exercise d/w pt.  Tries to limit fast food.    No blurry vision.  Urinary sx at baseline due to diuretics.    Meds, vitals, and allergies reviewed.   ROS: Per HPI unless specifically indicated in ROS section   GEN: nad, alert and oriented HEENT: ncat NECK: supple w/o LA CV: rrr. PULM: ctab, no inc wob ABD: soft, +bs EXT: no edema SKIN: no acute rash

## 2023-01-17 LAB — BASIC METABOLIC PANEL
BUN: 18 mg/dL (ref 7–25)
CO2: 25 mmol/L (ref 20–32)
Calcium: 10.5 mg/dL — ABNORMAL HIGH (ref 8.6–10.4)
Chloride: 101 mmol/L (ref 98–110)
Creat: 0.72 mg/dL (ref 0.50–1.03)
Glucose, Bld: 206 mg/dL — ABNORMAL HIGH (ref 65–99)
Potassium: 4.3 mmol/L (ref 3.5–5.3)
Sodium: 136 mmol/L (ref 135–146)

## 2023-01-18 ENCOUNTER — Other Ambulatory Visit: Payer: Self-pay | Admitting: Family Medicine

## 2023-01-18 ENCOUNTER — Encounter: Payer: Self-pay | Admitting: Family Medicine

## 2023-01-18 DIAGNOSIS — E119 Type 2 diabetes mellitus without complications: Secondary | ICD-10-CM

## 2023-01-18 NOTE — Assessment & Plan Note (Addendum)
New diagnosis.  She is familiar with diabetic diet.  See notes on labs. Discussed med options.  Start metformin.  1 pill a day for about 1 week.  Gradually increase up to 2 tabs twice a day if tolerated.  Update me about sugar in about 1-2 weeks, sooner if needed.  She can use her husband's meter. Try using low carbohydrate diet in the meantime.  Handout given to patient and discussed. Plan on recheck in about 3 months. A1c at the visit.

## 2023-01-29 ENCOUNTER — Encounter: Payer: Self-pay | Admitting: Family Medicine

## 2023-01-30 ENCOUNTER — Other Ambulatory Visit: Payer: Self-pay | Admitting: Cardiovascular Disease

## 2023-02-01 NOTE — Telephone Encounter (Signed)
Please check with patient about sugar meter options and send in a prescription for meter/lancets/strips.

## 2023-02-02 DIAGNOSIS — L7211 Pilar cyst: Secondary | ICD-10-CM | POA: Insufficient documentation

## 2023-04-03 DIAGNOSIS — E7849 Other hyperlipidemia: Secondary | ICD-10-CM | POA: Diagnosis not present

## 2023-04-03 LAB — LIPID PANEL
Chol/HDL Ratio: 3.4 {ratio} (ref 0.0–4.4)
Cholesterol, Total: 158 mg/dL (ref 100–199)
HDL: 47 mg/dL (ref >39)
LDL Chol Calc (NIH): 95 mg/dL (ref 0–99)
Triglycerides: 85 mg/dL (ref 0–149)
VLDL Cholesterol Cal: 16 mg/dL (ref 5–40)

## 2023-04-03 LAB — HEPATIC FUNCTION PANEL
ALT: 14 [IU]/L (ref 0–32)
AST: 21 [IU]/L (ref 0–40)
Albumin: 4.7 g/dL (ref 3.8–4.9)
Alkaline Phosphatase: 41 [IU]/L — ABNORMAL LOW (ref 44–121)
Bilirubin Total: 0.3 mg/dL (ref 0.0–1.2)
Bilirubin, Direct: 0.13 mg/dL (ref 0.00–0.40)
Total Protein: 7.4 g/dL (ref 6.0–8.5)

## 2023-04-05 ENCOUNTER — Other Ambulatory Visit: Payer: Self-pay | Admitting: Internal Medicine

## 2023-04-05 ENCOUNTER — Other Ambulatory Visit: Payer: Self-pay | Admitting: Cardiovascular Disease

## 2023-04-05 DIAGNOSIS — E7841 Elevated Lipoprotein(a): Secondary | ICD-10-CM

## 2023-04-05 DIAGNOSIS — E7849 Other hyperlipidemia: Secondary | ICD-10-CM

## 2023-04-05 DIAGNOSIS — Z951 Presence of aortocoronary bypass graft: Secondary | ICD-10-CM

## 2023-04-14 ENCOUNTER — Ambulatory Visit (HOSPITAL_BASED_OUTPATIENT_CLINIC_OR_DEPARTMENT_OTHER): Payer: Federal, State, Local not specified - PPO | Admitting: Internal Medicine

## 2023-04-14 VITALS — BP 116/60 | HR 70 | Ht <= 58 in | Wt 128.1 lb

## 2023-04-14 DIAGNOSIS — E7841 Elevated Lipoprotein(a): Secondary | ICD-10-CM | POA: Diagnosis not present

## 2023-04-14 DIAGNOSIS — E7849 Other hyperlipidemia: Secondary | ICD-10-CM | POA: Diagnosis not present

## 2023-04-14 DIAGNOSIS — M791 Myalgia, unspecified site: Secondary | ICD-10-CM | POA: Diagnosis not present

## 2023-04-14 DIAGNOSIS — Z951 Presence of aortocoronary bypass graft: Secondary | ICD-10-CM | POA: Diagnosis not present

## 2023-04-14 DIAGNOSIS — T466X5D Adverse effect of antihyperlipidemic and antiarteriosclerotic drugs, subsequent encounter: Secondary | ICD-10-CM

## 2023-04-14 NOTE — Progress Notes (Signed)
OFFICE NOTE  Chief Complaint:  Follow-up dyslipidemia  Primary Care Physician: Joaquim Nam, MD  HPI:  [name] is a 52 y.o. female with a past medial history significant for emergent single-vessel coronary artery bypass grafting with a 99% ostial LAD stenosis in February 2018.  She was not found to have any other significant obstructive coronary disease at the time.  Echo in May 2018 revealed an LVEF of 40-45% and an LAD wall motion abnormality.  He is a patient of Dr. Allyson Sabal.  She has an extensive history of dyslipidemia and was told by her uncle when she was a child in [place] that her cholesterol was very high and that she would likely need to be on treatment.  She is adopted and does not know her birth parents, but does have a daughter who is 36 years old.  She has not had cholesterol testing to her knowledge.  She had been started on statins, but reports significant intolerance, this is to high-dose atorvastatin, rosuvastatin, and possibly others that she cannot recall.  She was then referred to start Repatha.  Her lipid profile as of July 2018 showed total cholesterol 246, triglycerides 237, HDL 35 and LDL 164.  After starting therapy on Repatha, she received 4 doses, approximately 2 months of therapy and her repeat lipid profile showed a total cholesterol 265, triglycerides 220, LDL 176 and HDL of 45.  This is suggesting that she has had no response to the PCS canine inhibitor.  She reports compliance with the medication.  She had no side effects from it.  Possible reasons for this include a PCSK9 gain of function mutation or LDL receptor mutation.  Findings are possibly suggestive of familial hyperlipidemia although her LDL is moderately elevated.  Initially the plan was to consider switching her to Praluent and she has been provided samples, however, it is likely that she will have a similar poor response.  10/30/2017  [name] returns today for follow-up of dyslipidemia.  There is  clinical concern for familial hyperlipidemia and she does carry that diagnosis.  Unfortunately as previously mentioned she did not respond to PCSK9 inhibitor.  She did proceed with genetic testing after genetic counseling by Dr. Sidney Ace. 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  This finding is a pathogenic variant that does not allow the LDL receptor to fold properly and it is therefore poorly trafficked out of the golgi apparatus and rarely is expressed on the surface of the liver.  Based on this finding, it is understandable why a PCSK9 inhibitor which works by inhibiting the PCSK9 co-protein associated with surface LDL receptors would be ineffective as the total number of surface LDL receptors are extremely low.  We further discussed this mechanism and other strategies which may be helpful to lower her cholesterol.  She was again tried on pravastatin by the advanced heart failure clinic pharmacist, however had significant myalgias with this and may have some myopathy.  She says today that she is just now being able to straighten out the fingers on her hands.  At this point there are few additional options although she remains at high risk of recurrent events.  Her most recent cholesterol from several months ago showed total cholesterol 305, triglycerides 202, HDL 47 and LDL-C of 218.  Remaining options for her include LDL apheresis, for which she is likely a good candidate however is associated with significant cost and is cumbersome and would require her to likely  travel several times a month to either [facility] or [place].  The other option would be lomitapide.  By mechanism of inhibiting MTP, lomitapide should be effective at lowering her overall lipid profile by 30 to 40%, but is associated with significant GI side effects and liver toxicity.  This will need to be monitored closely and requires prescription by a provider who has completed the REMS  program (which I am certified).  02/12/2018  [name] returns today for follow-up.  We have been working very diligently over the past several months to try to get her approval for lomitapide.  After multiple appeal she has finally been approved.  She indicates that she may be able to get this for no co-pay.  We will again need to repeat a lipid profile and liver enzymes as a baseline since is been 3 months since her last study and she would anticipate starting the medication next week.  We will need to monitor her liver enzymes very closely.  She is also made significant dietary changes at the direction of the nutritionist for Juxtapid who is been working with her telephonically on the importance of changing her diet.  05/11/2018  [name] returns today for follow-up.  She seems to be tolerating Juxtapid, but does have some issues with joint pain which is been a longstanding problem.  We are adhering to the rems program, and she is having monthly liver enzyme tests.  She has known steatohepatitis and has had elevated liver enzymes in the past.  I am pleased to report her lipids have improved.  Her total cholesterol came down from 379-274 with triglycerides down from 300-200 and LDL from 284 to 224.  Unfortunately her liver enzymes are climbing.  Over the past 6 months her ALT is climbed from 54 to 155 and AST from 34 to 65.  The ALT is now about 5 times upper limit normal.  I discussed this with her at this point and concerned that she may not be able to tolerate the medication.  Guidelines for the manufacturer suggest decreasing the dose however she is on the lowest dose.  I discussed this further with our pharmacist Prudence Davidson, Pharm.D., who reviewed the pharmacokinetics of the drug and given its longer half-life of close to 48 hours felt that the drug could be effectively dosed every other day and perhaps still maintain steady state while reducing the possibility of side effects.  In addition, [name] suffers  from arthritic pain and recently has had an upper respiratory infection which worsened her symptoms.  She has been taking Tylenol fairly regularly for this since she was once told not to take nonsteroidals due to her heart disease.  She did not realize that the Tylenol could worsen her liver enzyme abnormalities.  01/19/2019  [name] is seen today in follow-up.  Overall she seems to be doing quite well.  Although we had been maintaining her on Juxtapid, I was disappointed in the minimal reduction in her lipids.  This was at the trade off of elevated liver enzymes.  Initially there was some concern about coadministration of Tylenol which we discontinued and it did improve her numbers however the liver enzymes did not allow [place] to uptitrate the medication.  Her cholesterol really did not get a meaningful reduction.  Previously it was noted that she had had some benefit although not as much as expected with a PCSK9 inhibitor.  I recommended that we restart Repatha to see if that would be of benefit.  In addition with the recent introduction of Nexletol, I felt this could also add to lipid lowering.  It is well-tolerated and since she has had statin side effects I felt that would be appropriate for her.  I am pleased to report she has had a significant reduction in her lipid profile.  Her LDL cholesterol is reduced from 222 down to 26.  Total cholesterol has come down to 100 from 277.  HDL is increased from 30-38 and there has been a small increase in triglycerides from 126-181.  Some of this may be dietary and related to decreased exercise that she cannot participate in cardiac rehab at this time and they are moving to [facility] therefore have been eating out more.  She had reported some cramping in her hands which is also resolved off of Juxtapid.  07/04/2019  [name] is seen today in follow-up.  Unfortunately she contracted Covid in late December.  She says she was sick for about 2 weeks.  She is also fallen off  of her diet and has not been doing exercise.  Cardiac rehabilitation was closed.  She recently moved and her new apartment complex has a gym.  She plans to get back to that.  This seems to track with an increase in her lipids.  She remains on Repatha.  She will need a renewal of her prescription.  When evaluating her triglycerides, they have increased steadily over the past year from 74-126 -181-212.  This also tracks a weight gain from 130 to 142 pounds.  Lipids recently were 122 total cholesterol, triglycerides 212, HDL 38 and LDL 50.  01/12/2020  [name] is seen today for follow-up.  Overall her lipids have been fairly stable.  Cholesterol most recently was 129, triglycerides 222, HDL 35 and LDL 58.  She is tolerating triple therapy on Repatha, Nexletol and ezetimibe.  Liver enzymes have been normal.  She reports some unusual left upper quadrant pain which she thought initially was her liver however I remind her the liver was on the right upper quadrant.  In addition she is describing some upper neck and lower occipital pain.  She wonders if this might be related to restarting her exercise.  These episodes may last for couple hours.  She has not tried nitroglycerin for it and did not feel that it was her heart although it had some similar features to prior to her previous cardiac episodes.  02/22/2021  [name] returns today for follow-up.  I last saw her in August 2021.  Her cholesterol has still remained lower however seems to be climbing.  This is primarily dietary.  LDL now 69 up from 58.  Triglycerides are higher at 313, up from 222.  She reports compliance with the Repatha, bempedoic acid and ezetimibe.  She also reports a decrease in physical activity which may be contributing to this.  She continues to be asymptomatic from a cardiac standpoint.  08/23/2021  [name] returns today for follow-up.  She continues to have very good lipid control on her current combination of Repatha and Nexletol.  Her  total cholesterol 135, triglycerides 175 HDL 39 and LDL 66.  I did assess for an LP(a) which was elevated mildly at 108.3.  Probably this was higher since we expect 20-30% reduction of LP(a) on Repatha.  09/01/2022  [name] returns today for follow-up.  Her lipids unfortunately have worsened somewhat since I last saw her.  Total cholesterol now 176, HDL 44, triglycerides 197 and LDL 98.  She reports her  diet has been off.  She has been under a lot of stress with family issues and eating more fast food.  11/25/2022  [name] returns today for follow-up.  She has tried to make substantive dietary changes since we last saw her unfortunately her cholesterol is worse.  Total now 183, triglycerides 316, HDL 37 and LDL 93.  She reports infrequent use of over-the-counter fish oil may be 1 capsule a day.  Otherwise she is consistently taking her other lipid-lowering medications.  She says she is never been on any prescription fish oil in the past, however with her history of coronary artery disease that would likely be indicated at this level of triglyceride elevation.  04/14/2023  [name] returns today for follow-up.  Her cholesterol has again improved with recent labs showing total cholesterol 158, triglycerides 85, HDL 47 and LDL 95.  This is primarily due to dietary changes.  She was recently diagnosed with type 2 diabetes as her hemoglobin A1c was noted to be 10.1% in August.  Subsequently she is started on metformin and made some dietary modifications.  Her liver enzymes have remained normal recently.  PMHx:  Past Medical History:  Diagnosis Date   Coronary artery disease    Headache    Heart murmur    aschild antibiotics if having Dental work   Hypocholesteremia    medical treatment   Ischemic cardiomyopathy    ef 40 to 45 %with global hypokinesia and apical aneurysm formation per 2D echo 06-13-2021   Menorrhagia    NSTEMI (non-ST elevated myocardial infarction) (HCC) 07/12/2016   Pelvic pain      Past Surgical History:  Procedure Laterality Date   ABDOMINAL HYSTERECTOMY     yrs ago   CORONARY ARTERY BYPASS GRAFT N/A 07/12/2016   Procedure: CORONARY ARTERY BYPASS GRAFTING (CABG), ON PUMP, TIMES ONE, USING LEFT INTERNAL MAMMARY ARTERY WITH TEE;  Surgeon: Alleen Borne, MD;  Location: [facility] OR;  Service: Open Heart Surgery;  Laterality: N/A;  LIMA to LAD   goiter     left side goiter removed   INGUINAL HERNIA REPAIR Bilateral 01/22/2016   Procedure: LAPAROSCOPIC BILATERAL FEMORAL AND RIGHT INGUINAL HERNIA REPAIRWITH INSERTION OF MESH;  Surgeon: Karie Soda, MD;  Location: [facility] ORS;  Service: General;  Laterality: Bilateral;   LEFT HEART CATH AND CORONARY ANGIOGRAPHY N/A 07/12/2016   Procedure: Left Heart Cath and Coronary Angiography;  Surgeon: Runell Gess, MD;  Location: [facility] INVASIVE CV LAB;  Service: Cardiovascular;  Laterality: N/A;   LEFT HEART CATH AND CORS/GRAFTS ANGIOGRAPHY N/A 03/08/2020   Procedure: LEFT HEART CATH AND CORS/GRAFTS ANGIOGRAPHY;  Surgeon: Runell Gess, MD;  Location: [facility] INVASIVE CV LAB;  Service: Cardiovascular;  Laterality: N/A;   MASS EXCISION N/A 01/08/2023   Procedure: REMOVAL OF SUBCUTANEOUS MASSES ON SCALP;  Surgeon: Karie Soda, MD;  Location: [facility];  Service: General;  Laterality: N/A;  GEN/LMA   SVD     x 1   WISDOM TOOTH EXTRACTION      FAMHx:  Family History  Adopted: Yes    SOCHx:   reports that she has never smoked. She has never used smokeless tobacco. She reports that she does not drink alcohol and does not use drugs.  ALLERGIES:  Allergies  Allergen Reactions   Statins Other (See Comments)    Muscle aches with higher doses of: Crestor & Lipitor   Bactrim Itching and Rash         ROS: Pertinent items noted in  HPI and remainder of comprehensive ROS otherwise negative.  HOME MEDS: Current Outpatient Medications on File Prior to Visit  Medication Sig Dispense Refill   aspirin 81 MG tablet Take 1  tablet (81 mg total) by mouth daily.     Coenzyme Q10 (CO Q-10) 200 MG CAPS 1 capsule with a meal     Evolocumab (REPATHA SURECLICK) 140 MG/ML SOAJ INJECT THE CONTENTS OF ONE PEN UNDER THE SKIN ONCE EVERY TWO WEEKS 6 mL 1   ezetimibe (ZETIA) 10 MG tablet TAKE ONE TABLET BY MOUTH ONE TIME DAILY 90 tablet 3   icosapent Ethyl (VASCEPA) 1 g capsule Take 2 capsules (2 g total) by mouth 2 (two) times daily. 360 capsule 3   isosorbide mononitrate (IMDUR) 30 MG 24 hr tablet TAKE ONE-HALF TABLET BY MOUTH ONE TIME DAILY 45 tablet 10   losartan (COZAAR) 25 MG tablet TAKE ONE TABLET BY MOUTH ONE TIME DAILY 90 tablet 3   metFORMIN (GLUCOPHAGE) 500 MG tablet Take 1-2 tablets (500-1,000 mg total) by mouth 2 (two) times daily with a meal. 360 tablet 1   metoprolol succinate (TOPROL-XL) 25 MG 24 hr tablet TAKE ONE TABLET BY MOUTH ONE TIME DAILY 90 tablet 3   NEXLETOL 180 MG TABS TAKE 1 TABLET BY MOUTH EVERY DAY 90 tablet 3   nitroGLYCERIN (NITROSTAT) 0.4 MG SL tablet Place 1 tablet (0.4 mg total) under the tongue every 5 (five) minutes x 3 doses as needed for chest pain. 30 tablet 6   spironolactone (ALDACTONE) 25 MG tablet TAKE ONE TABLET BY MOUTH ONE TIME DAILY 90 tablet 3   No current facility-administered medications on file prior to visit.    LABS/IMAGING: No results found for this or any previous visit (from the past 48 hour(s)). No results found.  LIPID PANEL:    Component Value Date/Time   CHOL 158 04/03/2023 0931   TRIG 85 04/03/2023 0931   HDL 47 04/03/2023 0931   CHOLHDL 3.4 04/03/2023 0931   CHOLHDL 7.8 09/02/2018 0449   VLDL 15 09/02/2018 0449   LDLCALC 95 04/03/2023 0931     WEIGHTS: Wt Readings from Last 3 Encounters:  04/14/23 128 lb 1.6 oz (58.1 kg)  01/16/23 125 lb (56.7 kg)  01/08/23 126 lb 9.6 oz (57.4 kg)    VITALS: BP 116/60   Pulse 70   Ht 4\' 10"  (1.473 m)   Wt 128 lb 1.6 oz (58.1 kg)   LMP 08/10/2011   SpO2 98%   BMI 26.77 kg/m    EXAM: Deferred  EKG: Deferred  ASSESSMENT: Coronary artery disease status post single-vessel CABG to the LAD Ischemic cardiomyopathy, EF 40-45% HeFH (heterozygous for a change in LDLR (c.1103G>A, p.Cys368Tyr) Documented statin intolerance Hepatic steatosis-elevated liver enzymes Elevated LP(a) at 108.3  PLAN: 1.   [name] he is on a number of lipid-lowering therapies including ezetimibe, Vascepa, Nexletol and Repatha and is achieved an LDL of 95.  Her cholesterol has been as low as 60 in the past and I think with further dietary modifications and weight loss that she may be able to achieve that.  There are few additional options that we can add to her therapy at this time.  Fortunately her liver enzymes have been stable on this therapy.  She did have a very mildly elevated LP(a) and perhaps could be a candidate for therapy regarding diet in the future.  For now we will continue with current medications and plan repeat lipids in about 6 months and follow-up at  that time.  Chrystie Nose, MD, [facility], FACP  [place]  [facility]  Medical Director of the Advanced Lipid Disorders &  Cardiovascular Risk Reduction Clinic Attending Cardiologist  Direct Dial: [phone]  Fax: [phone]  Website:  [email or web address] Nicely Clancey [place]ton 04/14/2023, 10:16 AM

## 2023-04-14 NOTE — Patient Instructions (Signed)
Medication Instructions:  Your physician recommends that you continue on your current medications as directed. Please refer to the Current Medication list given to you today.  *If you need a refill on your cardiac medications before your next appointment, please call your pharmacy*   Lab Work: LIPID PANEL  If you have labs (blood work) drawn today and your tests are completely normal, you will receive your results only by: MyChart Message (if you have MyChart) OR A paper copy in the mail If you have any lab test that is abnormal or we need to change your treatment, we will call you to review the results.   Follow-Up: At [facility], you and your health needs are our priority.  As part of our continuing mission to provide you with exceptional heart care, we have created designated Provider Care Teams.  These Care Teams include your primary Cardiologist (physician) and Advanced Practice Providers (APPs -  Physician Assistants and Nurse Practitioners) who all work together to provide you with the care you need, when you need it.  We recommend signing up for the patient portal called "MyChart".  Sign up information is provided on this After Visit Summary.  MyChart is used to connect with patients for Virtual Visits (Telemedicine).  Patients are able to view lab/test results, encounter notes, upcoming appointments, etc.  Non-urgent messages can be sent to your provider as well.   To learn more about what you can do with MyChart, go to [email or web address].    Your next appointment:   6 month(s)  Provider:   Dr. Allyson Sabal or Dr. Rennis Golden

## 2023-04-19 ENCOUNTER — Other Ambulatory Visit: Payer: Self-pay | Admitting: Cardiovascular Disease

## 2023-04-21 ENCOUNTER — Other Ambulatory Visit (INDEPENDENT_AMBULATORY_CARE_PROVIDER_SITE_OTHER): Payer: Federal, State, Local not specified - PPO

## 2023-04-21 DIAGNOSIS — E119 Type 2 diabetes mellitus without complications: Secondary | ICD-10-CM

## 2023-04-21 LAB — HEMOGLOBIN A1C: Hgb A1c MFr Bld: 7.7 % — ABNORMAL HIGH (ref 4.6–6.5)

## 2023-04-21 LAB — VITAMIN D 25 HYDROXY (VIT D DEFICIENCY, FRACTURES): VITD: 15.38 ng/mL — ABNORMAL LOW (ref 30.00–100.00)

## 2023-04-23 LAB — PTH, INTACT AND CALCIUM
Calcium: 10.4 mg/dL (ref 8.6–10.4)
PTH: 46 pg/mL (ref 16–77)

## 2023-04-28 ENCOUNTER — Ambulatory Visit: Payer: Federal, State, Local not specified - PPO | Admitting: Family Medicine

## 2023-04-30 ENCOUNTER — Ambulatory Visit: Payer: Federal, State, Local not specified - PPO | Admitting: Family Medicine

## 2023-04-30 ENCOUNTER — Encounter: Payer: Self-pay | Admitting: Family Medicine

## 2023-04-30 VITALS — BP 118/70 | HR 70 | Temp 97.6°F | Ht <= 58 in | Wt 126.0 lb

## 2023-04-30 DIAGNOSIS — E559 Vitamin D deficiency, unspecified: Secondary | ICD-10-CM

## 2023-04-30 DIAGNOSIS — E119 Type 2 diabetes mellitus without complications: Secondary | ICD-10-CM

## 2023-04-30 DIAGNOSIS — Z7984 Long term (current) use of oral hypoglycemic drugs: Secondary | ICD-10-CM

## 2023-04-30 LAB — MICROALBUMIN / CREATININE URINE RATIO
Creatinine,U: 98.2 mg/dL
Microalb Creat Ratio: 1.2 mg/g (ref 0.0–30.0)
Microalb, Ur: 1.2 mg/dL (ref 0.0–1.9)

## 2023-04-30 MED ORDER — VITAMIN D3 50 MCG (2000 UT) PO CAPS: 2000.0000 [IU] | ORAL_CAPSULE | Freq: Every day | ORAL | Status: AC

## 2023-04-30 NOTE — Patient Instructions (Addendum)
Go to the lab on the way out.   If you have mychart we'll likely use that to update you.     Recheck in about 3 months.  Labs ahead of time. Nonfasting lab visit.   Add on 2000 units vitamin D in the meantime.   Thanks for your effort.  Take care.  Glad to see you.  You can call for a mammogram at [facility] at [facility].  [address] [place] [address] [phone]

## 2023-04-30 NOTE — Progress Notes (Signed)
Diabetes:  Using medications without difficulties: yes Hypoglycemic episodes: no Hyperglycemic episodes: no Feet problems:no Blood Sugars averaging: usually ~ 100-200 eye exam within last year:  due, d/w pt.   Statin intolerant.  A1c improved.   Taking 2 metformin BID.   She made sig diet changes in the meantime.  D/w pt.   Vit D low.  D/w pt about replacement.   Flu shot encouraged.   Calcium and PTH wnl. D/w pt.    Mammogram order d/w pt.  See AVS.    PMH and SH reviewed  Meds, vitals, and allergies reviewed.   ROS: Per HPI unless specifically indicated in ROS section   GEN: nad, alert and oriented HEENT: ncat NECK: supple w/o LA CV: rrr. PULM: ctab, no inc wob ABD: soft, +bs EXT: no edema SKIN: no acute rash  Diabetic foot exam: Normal inspection No skin breakdown No calluses  Normal DP pulses Normal sensation to light touch and monofilament Nails normal

## 2023-05-03 DIAGNOSIS — E559 Vitamin D deficiency, unspecified: Secondary | ICD-10-CM | POA: Insufficient documentation

## 2023-05-03 NOTE — Assessment & Plan Note (Signed)
Vit D low.  D/w pt about replacement.  See orders.  Recheck in 3 months.

## 2023-05-03 NOTE — Assessment & Plan Note (Signed)
Statin intolerant.  A1c improved.   Taking 2 metformin BID.   She made sig diet changes in the meantime.  D/w pt.  See notes on labs. Recheck in about 3 months.  Labs ahead of time. Nonfasting lab visit.  Continue metformin as is.

## 2023-07-20 ENCOUNTER — Ambulatory Visit: Payer: Federal, State, Local not specified - PPO | Attending: Internal Medicine | Admitting: Internal Medicine

## 2023-07-20 ENCOUNTER — Encounter: Payer: Self-pay | Admitting: Internal Medicine

## 2023-07-20 VITALS — BP 106/62 | HR 55 | Ht <= 58 in | Wt 124.0 lb

## 2023-07-20 DIAGNOSIS — Z951 Presence of aortocoronary bypass graft: Secondary | ICD-10-CM | POA: Diagnosis not present

## 2023-07-20 DIAGNOSIS — E7841 Elevated Lipoprotein(a): Secondary | ICD-10-CM

## 2023-07-20 DIAGNOSIS — I255 Ischemic cardiomyopathy: Secondary | ICD-10-CM | POA: Diagnosis not present

## 2023-07-20 DIAGNOSIS — T466X5D Adverse effect of antihyperlipidemic and antiarteriosclerotic drugs, subsequent encounter: Secondary | ICD-10-CM

## 2023-07-20 DIAGNOSIS — M791 Myalgia, unspecified site: Secondary | ICD-10-CM

## 2023-07-20 DIAGNOSIS — E7849 Other hyperlipidemia: Secondary | ICD-10-CM

## 2023-07-20 MED ORDER — PRAVASTATIN SODIUM 40 MG PO TABS
40.0000 mg | ORAL_TABLET | Freq: Every evening | ORAL | 3 refills | Status: AC
Start: 2023-07-20 — End: ?

## 2023-07-20 NOTE — Progress Notes (Signed)
 OFFICE NOTE  Chief Complaint:  Follow-up dyslipidemia  Primary Care Physician: Rachel Nam, MD  HPI:  [name] is a 53 y.o. female with a past medial history significant for emergent single-vessel coronary artery bypass grafting with a 99% ostial LAD stenosis in February 2018.  She was not found to have any other significant obstructive coronary disease at the time.  Echo in May 2018 revealed an LVEF of 40-45% and an LAD wall motion abnormality.  He is a patient of Dr. Allyson [name].  She has an extensive history of dyslipidemia and was told by her uncle when she was a child in [place] that her cholesterol was very high and that she would likely need to be on treatment.  She is adopted and does not know her birth parents, but does have a daughter who is 48 years old.  She has not had cholesterol testing to her knowledge.  She had been started on statins, but reports significant intolerance, this is to high-dose atorvastatin, rosuvastatin, and possibly others that she cannot recall.  She was then referred to start Repatha.  Her lipid profile as of July 2018 showed total cholesterol 246, triglycerides 237, HDL 35 and LDL 164.  After starting therapy on Repatha, she received 4 doses, approximately 2 months of therapy and her repeat lipid profile showed a total cholesterol 265, triglycerides 220, LDL 176 and HDL of 45.  This is suggesting that she has had no response to the PCS canine inhibitor.  She reports compliance with the medication.  She had no side effects from it.  Possible reasons for this include a PCSK9 gain of function mutation or LDL receptor mutation.  Findings are possibly suggestive of familial hyperlipidemia although her LDL is moderately elevated.  Initially the plan was to consider switching her to Praluent and she has been provided samples, however, it is likely that she will have a similar poor response.  10/30/2017  [name] returns today for follow-up of dyslipidemia.  There is  clinical concern for familial hyperlipidemia and she does carry that diagnosis.  Unfortunately as previously mentioned she did not respond to PCSK9 inhibitor.  She did proceed with genetic testing after genetic counseling by [name]. 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  This finding is a pathogenic variant that does not allow the LDL receptor to fold properly and it is therefore poorly trafficked out of the golgi apparatus and rarely is expressed on the surface of the liver.  Based on this finding, it is understandable why a PCSK9 inhibitor which works by inhibiting the PCSK9 co-protein associated with surface LDL receptors would be ineffective as the total number of surface LDL receptors are extremely low.  We further discussed this mechanism and other strategies which may be helpful to lower her cholesterol.  She was again tried on pravastatin by the advanced heart failure clinic pharmacist, however had significant myalgias with this and may have some myopathy.  She says today that she is just now being able to straighten out the fingers on her hands.  At this point there are few additional options although she remains at high risk of recurrent events.  Her most recent cholesterol from several months ago showed total cholesterol 305, triglycerides 202, HDL 47 and LDL-C of 218.  Remaining options for her include LDL apheresis, for which she is likely a good candidate however is associated with significant cost and is cumbersome and would require her to likely  travel several times a month to either [facility] or [place].  The other option would be lomitapide.  By mechanism of inhibiting MTP, lomitapide should be effective at lowering her overall lipid profile by 30 to 40%, but is associated with significant GI side effects and liver toxicity.  This will need to be monitored closely and requires prescription by a provider who has completed the REMS  program (which I am certified).  02/12/2018  [name] returns today for follow-up.  We have been working very diligently over the past several months to try to get her approval for lomitapide.  After multiple appeal she has finally been approved.  She indicates that she may be able to get this for no co-pay.  We will again need to repeat a lipid profile and liver enzymes as a baseline since is been 3 months since her last study and she would anticipate starting the medication next week.  We will need to monitor her liver enzymes very closely.  She is also made significant dietary changes at the direction of the nutritionist for Juxtapid who is been working with her telephonically on the importance of changing her diet.  05/11/2018  [name] returns today for follow-up.  She seems to be tolerating Juxtapid, but does have some issues with joint pain which is been a longstanding problem.  We are adhering to the rems program, and she is having monthly liver enzyme tests.  She has known steatohepatitis and has had elevated liver enzymes in the past.  I am pleased to report her lipids have improved.  Her total cholesterol came down from 379-274 with triglycerides down from 300-200 and LDL from 284 to 224.  Unfortunately her liver enzymes are climbing.  Over the past 6 months her ALT is climbed from 54 to 155 and AST from 34 to 65.  The ALT is now about 5 times upper limit normal.  I discussed this with her at this point and concerned that she may not be able to tolerate the medication.  Guidelines for the manufacturer suggest decreasing the dose however she is on the lowest dose.  I discussed this further with our pharmacist [name], Pharm.D., who reviewed the pharmacokinetics of the drug and given its longer half-life of close to 48 hours felt that the drug could be effectively dosed every other day and perhaps still maintain steady state while reducing the possibility of side effects.  In addition, [name] suffers  from arthritic pain and recently has had an upper respiratory infection which worsened her symptoms.  She has been taking Tylenol fairly regularly for this since she was once told not to take nonsteroidals due to her heart disease.  She did not realize that the Tylenol could worsen her liver enzyme abnormalities.  01/19/2019  [name] is seen today in follow-up.  Overall she seems to be doing quite well.  Although we had been maintaining her on Juxtapid, I was disappointed in the minimal reduction in her lipids.  This was at the trade off of elevated liver enzymes.  Initially there was some concern about coadministration of Tylenol which we discontinued and it did improve her numbers however the liver enzymes did not allow [place] to uptitrate the medication.  Her cholesterol really did not get a meaningful reduction.  Previously it was noted that she had had some benefit although not as much as expected with a PCSK9 inhibitor.  I recommended that we restart Repatha to see if that would be of benefit.  In addition with the recent introduction of Nexletol, I felt this could also add to lipid lowering.  It is well-tolerated and since she has had statin side effects I felt that would be appropriate for her.  I am pleased to report she has had a significant reduction in her lipid profile.  Her LDL cholesterol is reduced from 222 down to 26.  Total cholesterol has come down to 100 from 277.  HDL is increased from 30-38 and there has been a small increase in triglycerides from 126-181.  Some of this may be dietary and related to decreased exercise that she cannot participate in cardiac rehab at this time and they are moving to [facility] therefore have been eating out more.  She had reported some cramping in her hands which is also resolved off of Juxtapid.  07/04/2019  [name] is seen today in follow-up.  Unfortunately she contracted Covid in late December.  She says she was sick for about 2 weeks.  She is also fallen off  of her diet and has not been doing exercise.  Cardiac rehabilitation was closed.  She recently moved and her new apartment complex has a gym.  She plans to get back to that.  This seems to track with an increase in her lipids.  She remains on Repatha.  She will need a renewal of her prescription.  When evaluating her triglycerides, they have increased steadily over the past year from 74-126 -181-212.  This also tracks a weight gain from 130 to 142 pounds.  Lipids recently were 122 total cholesterol, triglycerides 212, HDL 38 and LDL 50.  01/12/2020  [name] is seen today for follow-up.  Overall her lipids have been fairly stable.  Cholesterol most recently was 129, triglycerides 222, HDL 35 and LDL 58.  She is tolerating triple therapy on Repatha, Nexletol and ezetimibe.  Liver enzymes have been normal.  She reports some unusual left upper quadrant pain which she thought initially was her liver however I remind her the liver was on the right upper quadrant.  In addition she is describing some upper neck and lower occipital pain.  She wonders if this might be related to restarting her exercise.  These episodes may last for couple hours.  She has not tried nitroglycerin for it and did not feel that it was her heart although it had some similar features to prior to her previous cardiac episodes.  02/22/2021  [name] returns today for follow-up.  I last saw her in August 2021.  Her cholesterol has still remained lower however seems to be climbing.  This is primarily dietary.  LDL now 69 up from 58.  Triglycerides are higher at 313, up from 222.  She reports compliance with the Repatha, bempedoic acid and ezetimibe.  She also reports a decrease in physical activity which may be contributing to this.  She continues to be asymptomatic from a cardiac standpoint.  08/23/2021  [name] returns today for follow-up.  She continues to have very good lipid control on her current combination of Repatha and Nexletol.  Her  total cholesterol 135, triglycerides 175 HDL 39 and LDL 66.  I did assess for an LP(a) which was elevated mildly at 108.3.  Probably this was higher since we expect 20-30% reduction of LP(a) on Repatha.  09/01/2022  [name] returns today for follow-up.  Her lipids unfortunately have worsened somewhat since I last saw her.  Total cholesterol now 176, HDL 44, triglycerides 197 and LDL 98.  She reports her  diet has been off.  She has been under a lot of stress with family issues and eating more fast food.  11/25/2022  [name] returns today for follow-up.  She has tried to make substantive dietary changes since we last saw her unfortunately her cholesterol is worse.  Total now 183, triglycerides 316, HDL 37 and LDL 93.  She reports infrequent use of over-the-counter fish oil may be 1 capsule a day.  Otherwise she is consistently taking her other lipid-lowering medications.  She says she is never been on any prescription fish oil in the past, however with her history of coronary artery disease that would likely be indicated at this level of triglyceride elevation.  04/14/2023  [name] returns today for follow-up.  Her cholesterol has again improved with recent labs showing total cholesterol 158, triglycerides 85, HDL 47 and LDL 95.  This is primarily due to dietary changes.  She was recently diagnosed with type 2 diabetes as her hemoglobin A1c was noted to be 10.1% in August.  Subsequently she is started on metformin and made some dietary modifications.  Her liver enzymes have remained normal recently.  07/20/2023  [name] he is seen today in follow-up.  She reports that the cost of her Nexletol specifically is gone up substantially.  This is concerning for her as far as being able to continue on the medication.  She had her lipids repeated about 3 months ago.  Total cholesterol has improved down to 158 and triglycerides have substantially normalized at 85 (down from 316) with HDL 47 and LDL 95.  Overall she  remains asymptomatic.  She had a viral illness at the end of January which she has been slow to recover from but is improving.  PMHx:  Past Medical History:  Diagnosis Date   Coronary artery disease    Headache    Heart murmur    aschild antibiotics if having Dental work   Hypocholesteremia    medical treatment   Ischemic cardiomyopathy    ef 40 to 45 %with global hypokinesia and apical aneurysm formation per 2D echo 06-13-2021   Menorrhagia    NSTEMI (non-ST elevated myocardial infarction) (HCC) 07/12/2016   Pelvic pain     Past Surgical History:  Procedure Laterality Date   ABDOMINAL HYSTERECTOMY     yrs ago   CORONARY ARTERY BYPASS GRAFT N/A 07/12/2016   Procedure: CORONARY ARTERY BYPASS GRAFTING (CABG), ON PUMP, TIMES ONE, USING LEFT INTERNAL MAMMARY ARTERY WITH TEE;  Surgeon: Alleen Borne, MD;  Location: [facility] OR;  Service: Open Heart Surgery;  Laterality: N/A;  LIMA to LAD   goiter     left side goiter removed   INGUINAL HERNIA REPAIR Bilateral 01/22/2016   Procedure: LAPAROSCOPIC BILATERAL FEMORAL AND RIGHT INGUINAL HERNIA REPAIRWITH INSERTION OF MESH;  Surgeon: Karie Soda, MD;  Location: [facility] ORS;  Service: General;  Laterality: Bilateral;   LEFT HEART CATH AND CORONARY ANGIOGRAPHY N/A 07/12/2016   Procedure: Left Heart Cath and Coronary Angiography;  Surgeon: Runell Gess, MD;  Location: [facility] INVASIVE CV LAB;  Service: Cardiovascular;  Laterality: N/A;   LEFT HEART CATH AND CORS/GRAFTS ANGIOGRAPHY N/A 03/08/2020   Procedure: LEFT HEART CATH AND CORS/GRAFTS ANGIOGRAPHY;  Surgeon: Runell Gess, MD;  Location: [facility] INVASIVE CV LAB;  Service: Cardiovascular;  Laterality: N/A;   MASS EXCISION N/A 01/08/2023   Procedure: REMOVAL OF SUBCUTANEOUS MASSES ON SCALP;  Surgeon: Karie Soda, MD;  Location: [facility];  Service: General;  Laterality: N/A;  GEN/LMA  SVD     x 1   WISDOM TOOTH EXTRACTION      FAMHx:  Family History  Adopted: Yes    SOCHx:    reports that she has never smoked. She has never used smokeless tobacco. She reports that she does not drink alcohol and does not use drugs.  ALLERGIES:  Allergies  Allergen Reactions   Statins Other (See Comments)    Muscle aches with higher doses of: Crestor & Lipitor   Bactrim Itching and Rash         ROS: Pertinent items noted in HPI and remainder of comprehensive ROS otherwise negative.  HOME MEDS: Current Outpatient Medications on File Prior to Visit  Medication Sig Dispense Refill   aspirin 81 MG tablet Take 1 tablet (81 mg total) by mouth daily.     Cholecalciferol (VITAMIN D3) 50 MCG (2000 UT) capsule Take 1 capsule (2,000 Units total) by mouth daily.     Coenzyme Q10 (CO Q-10) 200 MG CAPS 1 capsule with a meal     Evolocumab (REPATHA SURECLICK) 140 MG/ML SOAJ INJECT THE CONTENTS OF ONE PEN UNDER THE SKIN ONCE EVERY TWO WEEKS 6 mL 1   ezetimibe (ZETIA) 10 MG tablet TAKE ONE TABLET BY MOUTH ONE TIME DAILY 90 tablet 3   icosapent Ethyl (VASCEPA) 1 g capsule Take 2 capsules (2 g total) by mouth 2 (two) times daily. 360 capsule 3   isosorbide mononitrate (IMDUR) 30 MG 24 hr tablet TAKE ONE-HALF TABLET BY MOUTH ONE TIME DAILY 45 tablet 10   losartan (COZAAR) 25 MG tablet TAKE ONE TABLET BY MOUTH ONE TIME DAILY 90 tablet 3   metFORMIN (GLUCOPHAGE) 500 MG tablet Take 1-2 tablets (500-1,000 mg total) by mouth 2 (two) times daily with a meal. 360 tablet 1   metoprolol succinate (TOPROL-XL) 25 MG 24 hr tablet TAKE ONE TABLET BY MOUTH ONE TIME DAILY 90 tablet 3   NEXLETOL 180 MG TABS TAKE 1 TABLET BY MOUTH EVERY DAY 90 tablet 3   nitroGLYCERIN (NITROSTAT) 0.4 MG SL tablet Place 1 tablet (0.4 mg total) under the tongue every 5 (five) minutes x 3 doses as needed for chest pain. 30 tablet 6   spironolactone (ALDACTONE) 25 MG tablet TAKE ONE TABLET BY MOUTH ONE TIME DAILY 90 tablet 3   No current facility-administered medications on file prior to visit.    LABS/IMAGING: No results found  for this or any previous visit (from the past 48 hours). No results found.  LIPID PANEL:    Component Value Date/Time   CHOL 158 04/03/2023 0931   TRIG 85 04/03/2023 0931   HDL 47 04/03/2023 0931   CHOLHDL 3.4 04/03/2023 0931   CHOLHDL 7.8 09/02/2018 0449   VLDL 15 09/02/2018 0449   LDLCALC 95 04/03/2023 0931     WEIGHTS: Wt Readings from Last 3 Encounters:  07/20/23 124 lb (56.2 kg)  04/30/23 126 lb (57.2 kg)  04/14/23 128 lb 1.6 oz (58.1 kg)    VITALS: BP 106/62 (BP Location: Left Arm, Patient Position: Sitting, Cuff Size: Normal)   Pulse (!) 55   Ht 4\' 10"  (1.473 m)   Wt 124 lb (56.2 kg)   LMP 08/10/2011   BMI 25.92 kg/m   EXAM: Deferred  EKG: Deferred  ASSESSMENT: Coronary artery disease status post single-vessel CABG to the LAD Ischemic cardiomyopathy, EF 40-45% HeFH (heterozygous for a change in LDLR (c.1103G>A, p.Cys368Tyr) Documented statin intolerance Hepatic steatosis-elevated liver enzymes Elevated LP(a) at 108.3  PLAN: 1.  [name] he is facing financial issues regarding the Nexletol.  The cost of medication is gone up substantially.  Although her lipids appear to be very good at this point including improvement in triglycerides which was substantial over the past 6 to 7 months, her cholesterol will go up if we stop that.  We talked about what other options she might have.  Previously she has been statin intolerant not being able to take rosuvastatin or atorvastatin.  She has not previously tried pravastatin.  I think this is an option at 40 mg daily and may be better tolerated.  This could replace her Nexletol.  She would plan to finish the remaining Nexletol and if she cannot get the medication at a reasonable price will switch to the pravastatin and we will plan repeat lipids about 2 to 3 months on therapy with follow-up with me in the summer.  Chrystie Nose, MD, [facility], FACP  [place]  [facility]  Medical Director of the Advanced Lipid  Disorders &  Cardiovascular Risk Reduction Clinic Attending Cardiologist  Direct Dial: [phone]  Fax: [phone]  Website:  [email or web address] 07/20/2023, 8:09 AM

## 2023-07-20 NOTE — Patient Instructions (Signed)
 Medication Instructions:  START pravastatin 40mg  once daily IF you are unable to continue taking Nexletol   *If you need a refill on your cardiac medications before your next appointment, please call your pharmacy*   Lab Work: FASTING lipid panel about 1 week before next appointment   If you have labs (blood work) drawn today and your tests are completely normal, you will receive your results only by: MyChart Message (if you have MyChart) OR A paper copy in the mail If you have any lab test that is abnormal or we need to change your treatment, we will call you to review the results.  Follow-Up: At [facility], you and your health needs are our priority.  As part of our continuing mission to provide you with exceptional heart care, we have created designated Provider Care Teams.  These Care Teams include your primary Cardiologist (physician) and Advanced Practice Providers (APPs -  Physician Assistants and Nurse Practitioners) who all work together to provide you with the care you need, when you need it.  We recommend signing up for the patient portal called "MyChart".  Sign up information is provided on this After Visit Summary.  MyChart is used to connect with patients for Virtual Visits (Telemedicine).  Patients are able to view lab/test results, encounter notes, upcoming appointments, etc.  Non-urgent messages can be sent to your provider as well.   To learn more about what you can do with MyChart, go to [email or web address].    Your next appointment:    6 months with Dr. Rennis Golden     1st Floor: - Lobby - Registration  - Pharmacy  - Lab - Cafe  2nd Floor: - PV Lab - Diagnostic Testing (echo, CT, nuclear med)  3rd Floor: - Vacant  4th Floor: - TCTS (cardiothoracic surgery) - AFib Clinic - Structural Heart Clinic - Vascular Surgery  - Vascular Ultrasound  5th Floor: - HeartCare Cardiology (general and EP) - Clinical Pharmacy for coumadin, hypertension,  lipid, weight-loss medications, and med management appointments    Valet parking services will be available as well.

## 2023-07-23 ENCOUNTER — Other Ambulatory Visit: Payer: Federal, State, Local not specified - PPO

## 2023-07-23 DIAGNOSIS — E119 Type 2 diabetes mellitus without complications: Secondary | ICD-10-CM

## 2023-07-23 DIAGNOSIS — E559 Vitamin D deficiency, unspecified: Secondary | ICD-10-CM | POA: Diagnosis not present

## 2023-07-23 LAB — VITAMIN D 25 HYDROXY (VIT D DEFICIENCY, FRACTURES): VITD: 27.45 ng/mL — ABNORMAL LOW (ref 30.00–100.00)

## 2023-07-23 LAB — HEMOGLOBIN A1C: Hgb A1c MFr Bld: 7.1 % — ABNORMAL HIGH (ref 4.6–6.5)

## 2023-07-24 ENCOUNTER — Other Ambulatory Visit: Payer: Self-pay | Admitting: Cardiovascular Disease

## 2023-07-30 ENCOUNTER — Ambulatory Visit: Payer: Federal, State, Local not specified - PPO | Admitting: Family Medicine

## 2023-07-30 ENCOUNTER — Encounter: Payer: Self-pay | Admitting: Family Medicine

## 2023-07-30 VITALS — BP 110/64 | HR 58 | Temp 98.5°F | Ht <= 58 in | Wt 121.6 lb

## 2023-07-30 DIAGNOSIS — Z7984 Long term (current) use of oral hypoglycemic drugs: Secondary | ICD-10-CM | POA: Diagnosis not present

## 2023-07-30 DIAGNOSIS — E559 Vitamin D deficiency, unspecified: Secondary | ICD-10-CM | POA: Diagnosis not present

## 2023-07-30 DIAGNOSIS — E119 Type 2 diabetes mellitus without complications: Secondary | ICD-10-CM | POA: Diagnosis not present

## 2023-07-30 NOTE — Progress Notes (Signed)
 Diabetes:  Using medications without difficulties:  yes, see below.  Hypoglycemic episodes:no sx Hyperglycemic episodes:no sx Feet problems:no Blood Sugars averaging: checked occ, usually 100-130 eye exam within last year: she is going to call about that.   A1c better.  Taking 1000mg  metformin BID.  No ADE on med except for occ GI discomfort, which could be diet dependent.    She is going to change from pravastatin to nexletol per cards, d/w pt.    Vit D still low but improved.  Taking 2000 units vit D per day.   D/w pt about seeing gyn clinic when possible.    Meds, vitals, and allergies reviewed.   ROS: Per HPI unless specifically indicated in ROS section   GEN: nad, alert and oriented HEENT: ncat NECK: supple w/o LA CV: rrr. PULM: ctab, no inc wob ABD: soft, +bs EXT: no edema SKIN: no acute rash

## 2023-07-30 NOTE — Patient Instructions (Addendum)
 Please call about seeing the eye and gynecology clinic when possible.   Recheck in about 3 months with labs ahead of time.  You don't need to fast.  Thanks for your effort.  Take care.  Glad to see you.

## 2023-08-02 NOTE — Assessment & Plan Note (Signed)
 Vit D still low but improved.  Taking 2000 units vit D per day.  Continue replacement.

## 2023-08-02 NOTE — Assessment & Plan Note (Signed)
 A1c better.  Taking 1000mg  metformin BID.  No ADE on med except for occ GI discomfort, which could be diet dependent.   No change in meds.  Recheck in about 3 months with labs ahead of time.

## 2023-08-11 ENCOUNTER — Other Ambulatory Visit (HOSPITAL_COMMUNITY): Payer: Self-pay

## 2023-08-11 ENCOUNTER — Telehealth: Payer: Self-pay | Admitting: Pharmacy Technician

## 2023-08-11 NOTE — Telephone Encounter (Signed)
 Pharmacy Patient Advocate Encounter   Received notification from Fax that prior authorization for repatha is required/requested.   Insurance verification completed.   The patient is insured through [facility] .   Per test claim: PA required; PA started via CoverMyMeds. KEY [id number] . Waiting for clinical questions to populate.   Previous PRIOR AUTHORIZATION EXPIRES ON 09/09/23 NEXT AVAILABLE FILL DATE [id number] LAST FILL DT [id number]

## 2023-08-24 ENCOUNTER — Other Ambulatory Visit: Payer: Self-pay | Admitting: Family Medicine

## 2023-08-27 ENCOUNTER — Encounter: Payer: Self-pay | Admitting: Family Medicine

## 2023-08-27 ENCOUNTER — Ambulatory Visit (INDEPENDENT_AMBULATORY_CARE_PROVIDER_SITE_OTHER): Admitting: Family Medicine

## 2023-08-27 VITALS — BP 118/64 | HR 54 | Temp 98.4°F | Ht <= 58 in | Wt 122.2 lb

## 2023-08-27 DIAGNOSIS — Z8639 Personal history of other endocrine, nutritional and metabolic disease: Secondary | ICD-10-CM | POA: Diagnosis not present

## 2023-08-27 LAB — TSH: TSH: 0.79 u[IU]/mL (ref 0.35–5.50)

## 2023-08-27 MED ORDER — METFORMIN HCL 500 MG PO TABS: 1000.0000 mg | ORAL_TABLET | Freq: Two times a day (BID) | ORAL | Status: DC

## 2023-08-27 NOTE — Patient Instructions (Signed)
 Go to the lab on the way out.   If you have mychart we'll likely use that to update you.    You should get a call about the ultrasound.  Let me know if you can't get scheduled.  Take care.  Glad to see you.

## 2023-08-27 NOTE — Progress Notes (Signed)
 She had a goiter removed a few years ago.  She has noted voice changes and noted a change in her throat.  Some cough.  This feels similar to prior goiter sx.  Some changes with swallowing, sensation of food sticking.  Hasn't needed the heimlich maneuver.  Sx noted in the last few months.    Meds, vitals, and allergies reviewed.   ROS: Per HPI unless specifically indicated in ROS section   Nad Ncat Neck supple, no LA Rrr Ctab Abd soft, not ttp I don't feel a thyroid mass in her neck.  No stridor.

## 2023-08-28 ENCOUNTER — Encounter: Payer: Self-pay | Admitting: Internal Medicine

## 2023-08-28 ENCOUNTER — Other Ambulatory Visit: Payer: Self-pay | Admitting: Internal Medicine

## 2023-08-28 DIAGNOSIS — Z951 Presence of aortocoronary bypass graft: Secondary | ICD-10-CM

## 2023-08-28 DIAGNOSIS — E7841 Elevated Lipoprotein(a): Secondary | ICD-10-CM

## 2023-08-28 DIAGNOSIS — E7849 Other hyperlipidemia: Secondary | ICD-10-CM

## 2023-08-30 ENCOUNTER — Encounter: Payer: Self-pay | Admitting: Family Medicine

## 2023-08-30 DIAGNOSIS — Z8639 Personal history of other endocrine, nutritional and metabolic disease: Secondary | ICD-10-CM | POA: Insufficient documentation

## 2023-08-30 NOTE — Assessment & Plan Note (Signed)
 See notes on labs.  Okay for outpatient follow-up.  Ultrasound ordered.  Rationale for workup discussed with patient.  She agrees to plan.

## 2023-08-31 ENCOUNTER — Telehealth: Payer: Self-pay

## 2023-08-31 ENCOUNTER — Other Ambulatory Visit (HOSPITAL_COMMUNITY): Payer: Self-pay

## 2023-08-31 NOTE — Telephone Encounter (Signed)
 Pharmacy Patient Advocate Encounter   Received notification from Physician's Office that prior authorization for REPATHA is required/requested.   Insurance verification completed.   The patient is insured through [employer] [facility] .   Per test claim: Refill too soon. PA is not needed at this time. Medication was filled 07/13/23. Next eligible fill date is 09/14/23 CURRENT PA EXPIRES ON 09/09/23. PLAN IS NOT ALLOWING RENEWAL UNTIL PA ACTUALLY EXPIRES. TEAM WILL ATTEMPT RENEWAL AGAIN ON OR AFTER 09/09/23

## 2023-09-01 ENCOUNTER — Other Ambulatory Visit (HOSPITAL_COMMUNITY): Payer: Self-pay

## 2023-09-02 ENCOUNTER — Ambulatory Visit
Admission: RE | Admit: 2023-09-02 | Discharge: 2023-09-02 | Disposition: A | Discharge status: Home / Self Care | Source: Ambulatory Visit | Attending: Family Medicine | Admitting: Family Medicine

## 2023-09-02 DIAGNOSIS — E041 Nontoxic single thyroid nodule: Secondary | ICD-10-CM | POA: Diagnosis not present

## 2023-09-02 DIAGNOSIS — Z8639 Personal history of other endocrine, nutritional and metabolic disease: Secondary | ICD-10-CM

## 2023-09-06 ENCOUNTER — Other Ambulatory Visit: Payer: Self-pay | Admitting: Family Medicine

## 2023-09-06 DIAGNOSIS — E041 Nontoxic single thyroid nodule: Secondary | ICD-10-CM

## 2023-09-09 ENCOUNTER — Telehealth: Payer: Self-pay | Admitting: Pharmacy Technician

## 2023-09-09 NOTE — Telephone Encounter (Signed)
 Pharmacy Patient Advocate Encounter   Received notification from Pt Calls Messages that prior authorization for REPATHA is required/requested.   Insurance verification completed.   The patient is insured through [employer] [facility] .   Per test claim: PA required; PA submitted to above mentioned insurance via CoverMyMeds Key/confirmation #/EOC [id number] Status is pending

## 2023-09-10 ENCOUNTER — Other Ambulatory Visit (HOSPITAL_COMMUNITY): Payer: Self-pay

## 2023-09-10 NOTE — Telephone Encounter (Signed)
 Sent the staff this message: Hi, we submitted another prior authorization as requested but insurance is asking if she is still on repatha since she has not had any benefit or if she started the praluent like the note on 07/20/23 said " After starting therapy on Repatha, she received 4 doses, approximately 2 months of therapy and her repeat lipid profile showed a total cholesterol 265, triglycerides 220, LDL 176 and HDL of 45.  This is suggesting that she has had no response to the PCS canine inhibitor.  She reports compliance with the medication.  She had no side effects from it.  Possible reasons for this include a PCSK9 gain of function mutation or LDL receptor mutation.  Findings are possibly suggestive of familial hyperlipidemia although her LDL is moderately elevated.  Initially the plan was to consider switching her to Praluent and she has been provided samples, however, it is likely that she will have a similar poor response."

## 2023-09-10 NOTE — Telephone Encounter (Signed)
 Message sent to Dr.Hilty for advice.

## 2023-09-10 NOTE — Telephone Encounter (Signed)
 Re-done under key [id number]

## 2023-09-10 NOTE — Telephone Encounter (Signed)
 Hi, we submitted another prior authorization as requested but insurance is asking if she is still on repatha since she has not had any benefit or if she started the praluent like the note on 07/20/23 said " After starting therapy on Repatha, she received 4 doses, approximately 2 months of therapy and her repeat lipid profile showed a total cholesterol 265, triglycerides 220, LDL 176 and HDL of 45.  This is suggesting that she has had no response to the PCS canine inhibitor.  She reports compliance with the medication.  She had no side effects from it.  Possible reasons for this include a PCSK9 gain of function mutation or LDL receptor mutation.  Findings are possibly suggestive of familial hyperlipidemia although her LDL is moderately elevated.  Initially the plan was to consider switching her to Praluent and she has been provided samples, however, it is likely that she will have a similar poor response."   Thank you!

## 2023-09-10 NOTE — Telephone Encounter (Signed)
 The insurance has changed their mind and has now approved.

## 2023-09-10 NOTE — Telephone Encounter (Signed)
 Pharmacy Patient Advocate Encounter  Received notification from [facility] that Prior Authorization for repatha has been APPROVED from 08/11/23 to 09/09/24  NEXT FILL DT [id number], LAST FILL DT [id number]  PA #/Case ID/Reference #: [id number]

## 2023-09-15 ENCOUNTER — Other Ambulatory Visit (HOSPITAL_COMMUNITY)
Admission: RE | Admit: 2023-09-15 | Discharge: 2023-09-15 | Disposition: A | Discharge status: Home / Self Care | Source: Ambulatory Visit | Attending: Interventional Radiology | Admitting: Interventional Radiology

## 2023-09-15 ENCOUNTER — Ambulatory Visit
Admission: RE | Admit: 2023-09-15 | Discharge: 2023-09-15 | Disposition: A | Discharge status: Home / Self Care | Source: Ambulatory Visit | Attending: Family Medicine | Admitting: Family Medicine

## 2023-09-15 DIAGNOSIS — E041 Nontoxic single thyroid nodule: Secondary | ICD-10-CM | POA: Insufficient documentation

## 2023-09-17 LAB — CYTOLOGY - NON PAP

## 2023-09-20 ENCOUNTER — Encounter: Payer: Self-pay | Admitting: Family Medicine

## 2023-10-15 ENCOUNTER — Other Ambulatory Visit: Payer: Self-pay | Admitting: Internal Medicine

## 2023-10-23 ENCOUNTER — Other Ambulatory Visit

## 2023-10-23 DIAGNOSIS — E119 Type 2 diabetes mellitus without complications: Secondary | ICD-10-CM

## 2023-10-23 DIAGNOSIS — E559 Vitamin D deficiency, unspecified: Secondary | ICD-10-CM | POA: Diagnosis not present

## 2023-10-23 LAB — HEMOGLOBIN A1C: Hgb A1c MFr Bld: 6.9 % — ABNORMAL HIGH (ref 4.6–6.5)

## 2023-10-23 LAB — VITAMIN D 25 HYDROXY (VIT D DEFICIENCY, FRACTURES): VITD: 35.35 ng/mL (ref 30.00–100.00)

## 2023-10-25 ENCOUNTER — Ambulatory Visit: Payer: Self-pay | Admitting: Family Medicine

## 2023-10-27 DIAGNOSIS — Z113 Encounter for screening for infections with a predominantly sexual mode of transmission: Secondary | ICD-10-CM | POA: Diagnosis not present

## 2023-10-27 DIAGNOSIS — Z124 Encounter for screening for malignant neoplasm of cervix: Secondary | ICD-10-CM | POA: Diagnosis not present

## 2023-10-27 DIAGNOSIS — Z1331 Encounter for screening for depression: Secondary | ICD-10-CM | POA: Diagnosis not present

## 2023-10-27 DIAGNOSIS — Z01411 Encounter for gynecological examination (general) (routine) with abnormal findings: Secondary | ICD-10-CM | POA: Diagnosis not present

## 2023-10-27 DIAGNOSIS — Z01419 Encounter for gynecological examination (general) (routine) without abnormal findings: Secondary | ICD-10-CM | POA: Diagnosis not present

## 2023-10-27 DIAGNOSIS — Z1231 Encounter for screening mammogram for malignant neoplasm of breast: Secondary | ICD-10-CM | POA: Diagnosis not present

## 2023-10-27 LAB — HM MAMMOGRAPHY

## 2023-10-30 ENCOUNTER — Ambulatory Visit: Admitting: Family Medicine

## 2023-10-30 ENCOUNTER — Encounter: Payer: Self-pay | Admitting: Family Medicine

## 2023-10-30 VITALS — BP 122/62 | HR 62 | Temp 98.6°F | Ht <= 58 in | Wt 124.2 lb

## 2023-10-30 DIAGNOSIS — Z8639 Personal history of other endocrine, nutritional and metabolic disease: Secondary | ICD-10-CM

## 2023-10-30 DIAGNOSIS — E119 Type 2 diabetes mellitus without complications: Secondary | ICD-10-CM

## 2023-10-30 NOTE — Progress Notes (Signed)
 Diabetes:  Using medications without difficulties: some nausea today but didn't sleep well last night.  This is not typical.  Hypoglycemic episodes:rare- she is staying on schedule with meals/snacks.   Hyperglycemic episodes:no sx Feet problems:no Blood Sugars averaging: not checked often.   eye exam within last year: due, d/w pt.   A1c improved.  D/w pt.   She is working on diet, limiting carbs.   Exercise d/w pt.    Vit D wnl.  D/w pt.    She had benign findings on the thyroid  biopsy. I think it makes sense to recheck an ultrasound in 1 year just to make sure the nodule has not changed. D/w pt.    Her grandson was born about 1 month ago, Arlen.  He is doing well.    She had pap smear and mammogram.  Requesting records.   Meds, vitals, and allergies reviewed.   ROS: Per HPI unless specifically indicated in ROS section   GEN: nad, alert and oriented HEENT: ncat NECK: supple w/o LA CV: rrr. PULM: ctab, no inc wob ABD: soft, +bs EXT: no edema SKIN: well perfused.

## 2023-10-30 NOTE — Patient Instructions (Addendum)
 Please ask the front for a record release from Dr. Audelia Leaks, re: pap and mammogram and office visit.  [facility].   Take care.  Glad to see you. Please call about an eye exam.  Ask about a diabetes exam.    Recheck at a yearly visit in about 6 months, labs ahead of time.

## 2023-11-01 NOTE — Assessment & Plan Note (Signed)
 A1c improved.  D/w pt.   She is working on diet, limiting carbs.   Exercise d/w pt.   No change in metformin .Recheck at a yearly visit in about 6 months, labs ahead of time.

## 2023-11-01 NOTE — Assessment & Plan Note (Signed)
 She had benign findings on the thyroid  biopsy. I think it makes sense to recheck an ultrasound in 1 year just to make sure the nodule has not changed. D/w pt.

## 2023-11-08 ENCOUNTER — Ambulatory Visit: Payer: Self-pay | Admitting: Family Medicine

## 2023-11-10 ENCOUNTER — Ambulatory Visit: Attending: Cardiovascular Disease | Admitting: Cardiovascular Disease

## 2023-11-10 ENCOUNTER — Encounter: Payer: Self-pay | Admitting: Cardiovascular Disease

## 2023-11-10 VITALS — BP 120/78 | HR 54 | Ht 60.0 in | Wt 125.0 lb

## 2023-11-10 DIAGNOSIS — I214 Non-ST elevation (NSTEMI) myocardial infarction: Secondary | ICD-10-CM | POA: Diagnosis not present

## 2023-11-10 DIAGNOSIS — I255 Ischemic cardiomyopathy: Secondary | ICD-10-CM | POA: Diagnosis not present

## 2023-11-10 DIAGNOSIS — E7849 Other hyperlipidemia: Secondary | ICD-10-CM

## 2023-11-10 DIAGNOSIS — I10 Essential (primary) hypertension: Secondary | ICD-10-CM

## 2023-11-10 NOTE — Assessment & Plan Note (Signed)
 History of essential hypertension blood pressure measured today at 120/78.  She is on losartan  and metoprolol .

## 2023-11-10 NOTE — Patient Instructions (Signed)

## 2023-11-10 NOTE — Assessment & Plan Note (Signed)
 History of non-STEMI.  With a prior to her bypass surgery which was done emergently 07/12/2016.  Cardiac cath showed a 99% ostial LAD lesion with an apical wall motion abnormality but no other significant CAD.  She has remained asymptomatic since.  I did do a heart cath on her 03/08/2020 revealing an occluded LAD with a patent LIMA no other significant CAD.

## 2023-11-10 NOTE — Assessment & Plan Note (Signed)
 History of ischemic cardiomyopathy with an EF in the 40 to 45% by echo 06/13/2021.  Apical aneurysm.  She is on GDMT and is asymptomatic.

## 2023-11-10 NOTE — Assessment & Plan Note (Signed)
 History of familial hyperlipidemia followed by Dr. [name] on multiple lipid-lowering agents with her most recent lipid profile performed 04/03/2023 revealing total cholesterol 158, LDL of 95 and HDL of 47.  Triglyceride level also came down significantly to 85.

## 2023-11-10 NOTE — Progress Notes (Signed)
 11/10/2023 [name]   02-17-1971  [id number]  Primary Physician Donnie Galea, MD Primary Cardiologist: [name] MD [name], [name]  HPI:  [name] is a 53 y.o.   mildly overweight married Caucasian female mother of one 70 year old daughter who recently had her granddaughter 2 months ago..  I last saw her  in the office 10/28/22. She underwent coronary artery bypass grafting times one emergently by Dr. [name] on 07/12/16. The remainder of her coronary arteries were unremarkable. She did have a non-STEMI on that day with progressively rising troponins and EKG changes. She had a 99% ostial LAD with anteroapical wall motion abnormality. She is recuperating nicely. She is about to start cardiac rehabilitation. She denies chest pain or shortness of breath. She does complain of some cramping in her legs which may be statin-related. Her last echo performed 10/21/16 revealed ejection fraction of 40-45% with wall motion and a all motion abnormality in the LAD distribution.   She was begun on carvedilol  which apparently she did not tolerate. She was also placed on Repatha  which did not affect her lipid profile as well as Praluent. She was referred to Dr. [name] for genetic testing for presumed FH. Repeat 2-D echo performed 05/06/17 revealed an EF of 40-45% with anteroapical hypokinesia.   I did refer her to Dr. [name] for advanced lipid evaluation therapy.  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  She was unresponsive to statin therapy and PCSK9.  She is now being tried on Juxtapid .  I also sent her to Dr. [name] in the advanced heart failure clinic.  She is on optimal medical therapy for this.  She is now NYHA class I-2 is relatively asymptomatic with an EF of 45 to 50%.     She was admitted 09/01/2018 for 1 day.  She ruled out for myocardial infarction.  She had no acute EKG changes.  A 2D echo was performed that showed an EF in the 35 to 40% range with apical aneurysm and  inferior hypokinesia.  She was seen by Dr. [name] and sent home for noncardiac chest pain.  Since being at home she is been asymptomatic.  She was taken off her beta-blocker because of bradycardia on telemetry although now she complains of tachycardia.    I did place her back on a beta-blocker   She has been seeing Dr. [name] for lipid management with incredible results.  She is complained of some fatigue, dyspnea on exertion since she is been going back to the gym and some back pain similar to her infarct pain.  She had a 2D echo performed 02/28/2020 that showed an EF in the 45 to 50% range and a Myoview  stress test performed the following day that showed anteroapical scar with peri-infarct ischemia.  Based on this I performed diagnostic coronary angiography 03/08/2020 revealing an occluded ostial LAD, patent LIMA to the LAD and otherwise no significant CAD.     Since I saw her a year and a half ago she is remained stable.  She denies chest pain or shortness of breath.  She continues to see Dr. [name].  Most recent lipid profile unfortunately performed 04/03/2023 revealed a total cholesterol 158, LDL of 95 and HDL 47.  She was also recently diagnosed with diabetes and was placed on metformin .  She has changed her diet.   Current Meds  Medication Sig   aspirin  81 MG tablet Take 1 tablet (81 mg total) by mouth daily.  Cholecalciferol (VITAMIN D3) 50 MCG (2000 UT) capsule Take 1 capsule (2,000 Units total) by mouth daily.   Coenzyme Q10 (CO Q-10) 200 MG CAPS 1 capsule with a meal   Evolocumab  (REPATHA  SURECLICK) 140 MG/ML SOAJ INJECT 140 MG UNDER THE SKIN EVERY TWO WEEKS INTO THE THIGH, STOMACH, OR UPPER ARM   ezetimibe  (ZETIA ) 10 MG tablet TAKE ONE TABLET BY MOUTH ONE TIME DAILY   icosapent  Ethyl (VASCEPA ) 1 g capsule Take 2 capsules (2 g total) by mouth 2 (two) times daily.   isosorbide  mononitrate (IMDUR ) 30 MG 24 hr tablet TAKE ONE-HALF TABLET BY MOUTH ONE TIME DAILY   losartan  (COZAAR ) 25 MG tablet  TAKE ONE TABLET BY MOUTH ONE TIME DAILY   metFORMIN  (GLUCOPHAGE ) 500 MG tablet Take 2 tablets (1,000 mg total) by mouth 2 (two) times daily with a meal.   metoprolol  succinate (TOPROL -XL) 25 MG 24 hr tablet TAKE ONE TABLET BY MOUTH ONE TIME DAILY   nitroGLYCERIN  (NITROSTAT ) 0.4 MG SL tablet Place 1 tablet (0.4 mg total) under the tongue every 5 (five) minutes x 3 doses as needed for chest pain.   pravastatin  (PRAVACHOL ) 40 MG tablet Take 1 tablet (40 mg total) by mouth every evening.   spironolactone  (ALDACTONE ) 25 MG tablet TAKE ONE TABLET BY MOUTH ONE TIME DAILY     Allergies  Allergen Reactions   Statins Other (See Comments)    Muscle aches with higher doses of: Crestor  & Lipitor    Bactrim Itching and Rash         Social History   Socioeconomic History   Marital status: Married    Spouse name: Not on file   Number of children: Not on file   Years of education: Not on file   Highest education level: Some college, no degree  Occupational History   Not on file  Tobacco Use   Smoking status: Never   Smokeless tobacco: Never  Vaping Use   Vaping status: Never Used  Substance and Sexual Activity   Alcohol use: No   Drug use: No   Sexual activity: Yes    Birth control/protection: None  Other Topics Concern   Not on file  Social History Narrative   Married 1996.   Adult daughter born 1999, in graduate school at [employer] for speech pathology.   From [place].   Speaks Spanish and [place].   Art therapist at [employer].      Grandson Arlen born 2025.     Social Drivers of Corporate investment banker Strain: Low Risk  (10/29/2023)   Overall Financial Resource Strain (CARDIA)    Difficulty of Paying Living Expenses: Not hard at all  Food Insecurity: No Food Insecurity (10/29/2023)   Hunger Vital Sign    Worried About Running Out of Food in the Last Year: Never true    Ran Out of Food in the Last Year: Never true  Transportation Needs: No Transportation  Needs (10/29/2023)   PRAPARE - Administrator, Civil Service (Medical): No    Lack of Transportation (Non-Medical): No  Physical Activity: Insufficiently Active (10/29/2023)   Exercise Vital Sign    Days of Exercise per Week: 4 days    Minutes of Exercise per Session: 30 min  Stress: No Stress Concern Present (10/29/2023)   [employer] of Occupational Health - Occupational Stress Questionnaire    Feeling of Stress : Not at all  Social Connections: Moderately Integrated (10/29/2023)   Social Connection and  Isolation Panel    Frequency of Communication with Friends and Family: More than three times a week    Frequency of Social Gatherings with Friends and Family: Twice a week    Attends Religious Services: Never    Database administrator or Organizations: Yes    Attends Engineer, structural: More than 4 times per year    Marital Status: Married  Catering manager Violence: Not on file     Review of Systems: General: negative for chills, fever, night sweats or weight changes.  Cardiovascular: negative for chest pain, dyspnea on exertion, edema, orthopnea, palpitations, paroxysmal nocturnal dyspnea or shortness of breath Dermatological: negative for rash Respiratory: negative for cough or wheezing Urologic: negative for hematuria Abdominal: negative for nausea, vomiting, diarrhea, bright red blood per rectum, melena, or hematemesis Neurologic: negative for visual changes, syncope, or dizziness All other systems reviewed and are otherwise negative except as noted above.    Blood pressure 120/78, pulse (!) 54, height 5' (1.524 m), weight 125 lb (56.7 kg), last menstrual period 08/10/2011, SpO2 97%.  General appearance: alert and no distress Neck: no adenopathy, no carotid bruit, no JVD, supple, symmetrical, trachea midline, and thyroid  not enlarged, symmetric, no tenderness/mass/nodules Lungs: clear to auscultation bilaterally Heart: Soft outflow tract  murmur Extremities: extremities normal, atraumatic, no cyanosis or edema Pulses: 2+ and symmetric Skin: Skin color, texture, turgor normal. No rashes or lesions Neurologic: Grossly normal  EKG EKG Interpretation Date/Time:  Tuesday November 10 2023 11:18:29 EDT Ventricular Rate:  54 PR Interval:  160 QRS Duration:  60 QT Interval:  448 QTC Calculation: 424 R Axis:   21  Text Interpretation: Sinus bradycardia Anteroseptal infarct , age undetermined ST & T wave abnormality, consider lateral ischemia When compared with ECG of 01-Sep-2018 17:18, PREVIOUS ECG IS PRESENT Confirmed by Lauro Portal [phone]) on 11/10/2023 11:39:33 AM    ASSESSMENT AND PLAN:   NSTEMI (non-ST elevated myocardial infarction) (HCC) History of non-STEMI.  With a prior to her bypass surgery which was done emergently 07/12/2016.  Cardiac cath showed a 99% ostial LAD lesion with an apical wall motion abnormality but no other significant CAD.  She has remained asymptomatic since.  I did do a heart cath on her 03/08/2020 revealing an occluded LAD with a patent LIMA no other significant CAD.  Familial hyperlipidemia History of familial hyperlipidemia followed by Dr. [name] on multiple lipid-lowering agents with her most recent lipid profile performed 04/03/2023 revealing total cholesterol 158, LDL of 95 and HDL of 47.  Triglyceride level also came down significantly to 85.  Ischemic cardiomyopathy History of ischemic cardiomyopathy with an EF in the 40 to 45% by echo 06/13/2021.  Apical aneurysm.  She is on GDMT and is asymptomatic.  Hypertension History of essential hypertension blood pressure measured today at 120/78.  She is on losartan  and metoprolol .     [name] MD [facility], [facility] 11/10/2023 11:48 AM

## 2023-11-12 ENCOUNTER — Other Ambulatory Visit: Payer: Self-pay | Admitting: Cardiovascular Disease

## 2023-11-20 DIAGNOSIS — R92332 Mammographic heterogeneous density, left breast: Secondary | ICD-10-CM | POA: Diagnosis not present

## 2023-11-20 DIAGNOSIS — R928 Other abnormal and inconclusive findings on diagnostic imaging of breast: Secondary | ICD-10-CM | POA: Diagnosis not present

## 2023-12-24 ENCOUNTER — Other Ambulatory Visit: Payer: Self-pay | Admitting: Internal Medicine

## 2024-01-04 ENCOUNTER — Other Ambulatory Visit (HOSPITAL_COMMUNITY): Payer: Self-pay

## 2024-01-06 ENCOUNTER — Telehealth: Payer: Self-pay

## 2024-01-06 NOTE — Telephone Encounter (Signed)
 Pharmacy Patient Advocate Encounter   Received notification from Onbase that prior authorization for NEXLETOL  180MG   is due for renewal.   Insurance verification completed.   The patient is insured through [facility].  Action: Medication has been discontinued. Archived Key: [id number]

## 2024-01-14 ENCOUNTER — Ambulatory Visit: Payer: Self-pay

## 2024-01-14 NOTE — Telephone Encounter (Signed)
 FYI Only or Action Required?: FYI only for provider.  Patient was last seen in primary care on 10/30/2023 by Cleatus Arlyss RAMAN, MD.  Called Nurse Triage reporting Muscle Pain.  Symptoms began a week ago.  Interventions attempted: OTC medications: Tylenol .  Symptoms are: unchanged.  Triage Disposition: See HCP Within 4 Hours (Or PCP Triage)  Patient/caregiver understands and will follow disposition?: Yes, but will wait  Copied from CRM #[id number]. Topic: Clinical - Red Word Triage >> Jan 14, 2024 11:20 AM Charolett L wrote: [facility] that prompted transfer to Nurse Triage: Patient stated that she's fatigue, flu type symptoms, bones/joints hurt Reason for Disposition  [1] SEVERE pain AND [2] taking a statin medicine (a lipid or cholesterol lowering drug)  Answer Assessment - Initial Assessment Questions 1. ONSET: When did the muscle aches or body pains start?      One week  2. LOCATION: What part of your body is hurting? (e.g., entire body, arms, legs)      Back muscles, all joints painful  3. SEVERITY: How bad is the pain? (Scale 1-10; or mild, moderate, severe)     Sore 4. CAUSE: What do you think is causing the pains?     Unsure-flu 5. FEVER: Do you have a fever? If Yes, ask: What is your temperature, how was it measured, and  when did it start?      Denies but having chills  6. OTHER SYMPTOMS: Do you have any other symptoms? (e.g., chest pain, cold or flu symptoms, rash, weakness, weight loss)      Fatigue. Denies all respiratory symptoms.  Protocols used: Muscle Aches and Body Pain-A-AH

## 2024-01-14 NOTE — Telephone Encounter (Signed)
 Noted. Thanks.

## 2024-01-14 NOTE — Telephone Encounter (Signed)
 Patient was already triaged today-stated that when she was triaged earlier, she was told there are was 2:00 PM appointment available today but she was unable to take it due to no ride. Patient calling back stating she now has a ride and wanting to see if that appointment for today was still available. Patient is updated that there are no longer any available appointments in the office for today. Patient is encouraged to keep her appointment that was scheduled for tomorrow. Patient is instructed that if she would like to be seen today, Urgent care is her best option. Patient endorses that she will keep her appointment for today.

## 2024-01-15 ENCOUNTER — Encounter: Payer: Self-pay | Admitting: Family Medicine

## 2024-01-15 ENCOUNTER — Ambulatory Visit: Admitting: Family Medicine

## 2024-01-15 VITALS — BP 130/62 | HR 62 | Temp 98.0°F | Ht 60.0 in | Wt 127.0 lb

## 2024-01-15 DIAGNOSIS — E7849 Other hyperlipidemia: Secondary | ICD-10-CM | POA: Diagnosis not present

## 2024-01-15 DIAGNOSIS — M791 Myalgia, unspecified site: Secondary | ICD-10-CM | POA: Diagnosis not present

## 2024-01-15 DIAGNOSIS — R5383 Other fatigue: Secondary | ICD-10-CM | POA: Insufficient documentation

## 2024-01-15 DIAGNOSIS — R5382 Chronic fatigue, unspecified: Secondary | ICD-10-CM

## 2024-01-15 DIAGNOSIS — M2550 Pain in unspecified joint: Secondary | ICD-10-CM | POA: Diagnosis not present

## 2024-01-15 DIAGNOSIS — E559 Vitamin D deficiency, unspecified: Secondary | ICD-10-CM | POA: Diagnosis not present

## 2024-01-15 LAB — COMPREHENSIVE METABOLIC PANEL WITH GFR
ALT: 23 U/L (ref 0–35)
AST: 19 U/L (ref 0–37)
Albumin: 4.6 g/dL (ref 3.5–5.2)
Alkaline Phosphatase: 43 U/L (ref 39–117)
BUN: 14 mg/dL (ref 6–23)
CO2: 29 meq/L (ref 19–32)
Calcium: 9.2 mg/dL (ref 8.4–10.5)
Chloride: 102 meq/L (ref 96–112)
Creatinine, Ser: 0.61 mg/dL (ref 0.40–1.20)
GFR: 100.5 mL/min (ref >60.00)
Glucose, Bld: 125 mg/dL — ABNORMAL HIGH (ref 70–99)
Potassium: 4.6 meq/L (ref 3.5–5.1)
Sodium: 139 meq/L (ref 135–145)
Total Bilirubin: 0.4 mg/dL (ref 0.2–1.2)
Total Protein: 7 g/dL (ref 6.0–8.3)

## 2024-01-15 LAB — VITAMIN B12: Vitamin B-12: 334 pg/mL (ref 211–911)

## 2024-01-15 LAB — CBC WITH DIFFERENTIAL/PLATELET
Basophils Absolute: 0 K/uL (ref 0.0–0.1)
Basophils Relative: 0.4 % (ref 0.0–3.0)
Eosinophils Absolute: 0.1 K/uL (ref 0.0–0.7)
Eosinophils Relative: 2.4 % (ref 0.0–5.0)
HCT: 37.8 % (ref 36.0–46.0)
Hemoglobin: 12.8 g/dL (ref 12.0–15.0)
Lymphocytes Relative: 35.1 % (ref 12.0–46.0)
Lymphs Abs: 2.1 K/uL (ref 0.7–4.0)
MCHC: 33.9 g/dL (ref 30.0–36.0)
MCV: 87.1 fl (ref 78.0–100.0)
Monocytes Absolute: 0.4 K/uL (ref 0.1–1.0)
Monocytes Relative: 6.5 % (ref 3.0–12.0)
Neutro Abs: 3.3 K/uL (ref 1.4–7.7)
Neutrophils Relative %: 55.6 % (ref 43.0–77.0)
Platelets: 273 K/uL (ref 150.0–400.0)
RBC: 4.34 Mil/uL (ref 3.87–5.11)
RDW: 13.3 % (ref 11.5–15.5)
WBC: 5.9 K/uL (ref 4.0–10.5)

## 2024-01-15 LAB — VITAMIN D 25 HYDROXY (VIT D DEFICIENCY, FRACTURES): VITD: 32.35 ng/mL (ref 30.00–100.00)

## 2024-01-15 LAB — IRON: Iron: 73 ug/dL (ref 42–145)

## 2024-01-15 LAB — CK: Total CK: 133 U/L (ref 17–177)

## 2024-01-15 LAB — TSH: TSH: 1.39 u[IU]/mL (ref 0.35–5.50)

## 2024-01-15 LAB — SEDIMENTATION RATE: Sed Rate: 11 mm/h (ref 0–30)

## 2024-01-15 NOTE — Assessment & Plan Note (Signed)
 With arthralgia and fatigue for about a week  Reassuring exam  Lab today   Instructed to hold statin for brief time to see if helpful

## 2024-01-15 NOTE — Progress Notes (Signed)
 Subjective:    Patient ID: Georgiann Settler, female    DOB: [birth year], 53 y.o.   MRN: [id number]  HPI  Wt Readings from Last 3 Encounters:  01/15/24 127 lb (57.6 kg)  11/10/23 125 lb (56.7 kg)  10/30/23 124 lb 3.2 oz (56.3 kg)   24.80 kg/m  Vitals:   01/15/24 0925  BP: 130/62  Pulse: 62  Temp: 98 F (36.7 C)  SpO2: 98%   53 yo pt of Dr Cleatus presents for muscle /joint pain and fatigue   Her PMH is notable for CAD/ cardiomyopahy, DM, HTN ,hyperlipidemia and vit D def  Started a week ago   Very tired  Every muscle and joint hurts  Occational feels hot or cold but no fever   No joint swelling No rash   If she rests, she feels better  Once she gets moving it comes back   No uri symptoms No urinary symptoms   Blood sugar has been higher    No increase in stress lately  No insect or tick bites that she knows of     Lab Results  Component Value Date   NA 136 01/16/2023   K 4.3 01/16/2023   CO2 25 01/16/2023   GLUCOSE 206 (H) 01/16/2023   BUN 18 01/16/2023   CREATININE 0.72 01/16/2023   CALCIUM  10.4 04/21/2023   GFRNONAA 94 03/06/2020   Lab Results  Component Value Date   ALT 14 04/03/2023   AST 21 04/03/2023   ALKPHOS 41 (L) 04/03/2023   BILITOT 0.3 04/03/2023   Lab Results  Component Value Date   WBC 5.5 03/06/2020   HGB 12.2 01/08/2023   HCT 36.0 01/08/2023   MCV 88 03/06/2020   PLT 302 03/06/2020   Last vitamin D  Lab Results  Component Value Date   VD25OH 35.35 10/23/2023   Lab Results  Component Value Date   CHOL 158 04/03/2023   HDL 47 04/03/2023   LDLCALC 95 04/03/2023   TRIG 85 04/03/2023   CHOLHDL 3.4 04/03/2023   Dr Mona for cholesterol Pravastatin   Repatha   Zetia   Co Q 10 vascepa   Had muscle pain with crestor  and lipitor    Lab Results  Component Value Date   HGBA1C 6.9 (H) 10/23/2023   HGBA1C 7.1 (H) 07/23/2023   HGBA1C 7.7 (H) 04/21/2023      Patient Active Problem List   Diagnosis Date Noted   Fatigue  01/15/2024   Arthralgia 01/15/2024   Myalgia 01/15/2024   History of goiter 08/30/2023   Vitamin D  deficiency 05/03/2023   Pilar cysts 02/02/2023   Diabetes mellitus without complication (HCC) 01/08/2023   Cardiomyopathy (HCC) 11/11/2022   Scalp mass 11/11/2022   Abnormal nuclear stress test    Hypertension 09/02/2018   Chest pain of uncertain etiology 09/01/2018   Trigger thumb, right thumb 08/12/2018   Elevated liver enzymes 04/01/2018   CAD in native artery 04/24/2017   Fatigue due to treatment 04/03/2017   Ischemic cardiomyopathy 03/17/2017   Familial hyperlipidemia 12/26/2016   S/P CABG x 1 07/12/2016   NSTEMI (non-ST elevated myocardial infarction) (HCC) 07/11/2016   Past Medical History:  Diagnosis Date   Coronary artery disease    Headache    Heart murmur    aschild antibiotics if having Dental work   Hypocholesteremia    medical treatment   Ischemic cardiomyopathy    ef 40 to 45 %with global hypokinesia and apical aneurysm formation per 2D echo 06-13-2021   Menorrhagia    NSTEMI (  non-ST elevated myocardial infarction) (HCC) 07/12/2016   Pelvic pain    Past Surgical History:  Procedure Laterality Date   ABDOMINAL HYSTERECTOMY     yrs ago   CORONARY ARTERY BYPASS GRAFT N/A 07/12/2016   Procedure: CORONARY ARTERY BYPASS GRAFTING (CABG), ON PUMP, TIMES ONE, USING LEFT INTERNAL MAMMARY ARTERY WITH TEE;  Surgeon: Dorise MARLA Fellers, MD;  Location: [facility] OR;  Service: Open Heart Surgery;  Laterality: N/A;  LIMA to LAD   goiter     s/p partial thryoid removal.   INGUINAL HERNIA REPAIR Bilateral 01/22/2016   Procedure: LAPAROSCOPIC BILATERAL FEMORAL AND RIGHT INGUINAL HERNIA REPAIRWITH INSERTION OF MESH;  Surgeon: Elspeth Schultze, MD;  Location: [facility];  Service: General;  Laterality: Bilateral;   LEFT HEART CATH AND CORONARY ANGIOGRAPHY N/A 07/12/2016   Procedure: Left Heart Cath and Coronary Angiography;  Surgeon: Dorn JINNY Lesches, MD;  Location: [facility];  Service:  Cardiovascular;  Laterality: N/A;   LEFT HEART CATH AND CORS/GRAFTS ANGIOGRAPHY N/A 03/08/2020   Procedure: LEFT HEART CATH AND CORS/GRAFTS ANGIOGRAPHY;  Surgeon: Lesches Dorn JINNY, MD;  Location: [facility] INVASIVE CV LAB;  Service: Cardiovascular;  Laterality: N/A;   MASS EXCISION N/A 01/08/2023   Procedure: REMOVAL OF SUBCUTANEOUS MASSES ON SCALP;  Surgeon: Schultze Elspeth, MD;  Location:  SURGERY CENTER;  Service: General;  Laterality: N/A;  GEN/LMA   SVD     x 1   WISDOM TOOTH EXTRACTION     Social History   Tobacco Use   Smoking status: Never   Smokeless tobacco: Never  Vaping Use   Vaping status: Never Used  Substance Use Topics   Alcohol use: No   Drug use: No   Family History  Adopted: Yes   Allergies  Allergen Reactions   Statins Other (See Comments)    Muscle aches with higher doses of: Crestor  & Lipitor    Bactrim Itching and Rash        Current Outpatient Medications on File Prior to Visit  Medication Sig Dispense Refill   aspirin  81 MG tablet Take 1 tablet (81 mg total) by mouth daily.     Cholecalciferol (VITAMIN D3) 50 MCG (2000 UT) capsule Take 1 capsule (2,000 Units total) by mouth daily.     Coenzyme Q10 (CO Q-10) 200 MG CAPS 1 capsule with a meal     Evolocumab  (REPATHA  SURECLICK) 140 MG/ML SOAJ INJECT 140 MG UNDER THE SKIN EVERY TWO WEEKS INTO THE THIGH, STOMACH, OR UPPER ARM 6 mL 1   ezetimibe  (ZETIA ) 10 MG tablet TAKE ONE TABLET BY MOUTH ONE TIME DAILY 90 tablet 3   icosapent  Ethyl (VASCEPA ) 1 g capsule Take 2 capsules (2 g total) by mouth 2 (two) times daily. 360 capsule 3   isosorbide  mononitrate (IMDUR ) 30 MG 24 hr tablet TAKE ONE-HALF TABLET BY MOUTH ONE TIME DAILY 45 tablet 10   losartan  (COZAAR ) 25 MG tablet TAKE ONE TABLET BY MOUTH ONE TIME DAILY 90 tablet 3   metFORMIN  (GLUCOPHAGE ) 500 MG tablet Take 2 tablets (1,000 mg total) by mouth 2 (two) times daily with a meal.     metoprolol  succinate (TOPROL -XL) 25 MG 24 hr tablet TAKE ONE TABLET BY  MOUTH ONE TIME DAILY 90 tablet 3   nitroGLYCERIN  (NITROSTAT ) 0.4 MG SL tablet PLACE ONE TABLET UNDER THE TONGUE AT ONSET OF CHEST PAIN. MAY REPEAT EVERY 5 MINUTES AS NEEDED FOR CHEST PAIN UP TO 3 TABLETS IN 15 MINUTES. IF NO RELIEF AFTER 5 MINUTES, CALL 911.  25 tablet 5   pravastatin  (PRAVACHOL ) 40 MG tablet Take 1 tablet (40 mg total) by mouth every evening. 90 tablet 3   spironolactone  (ALDACTONE ) 25 MG tablet TAKE ONE TABLET BY MOUTH ONE TIME DAILY 90 tablet 3   No current facility-administered medications on file prior to visit.    Review of Systems  Constitutional:  Positive for fatigue. Negative for activity change, appetite change, fever and unexpected weight change.  HENT:  Negative for congestion, ear pain, rhinorrhea, sinus pressure and sore throat.   Eyes:  Negative for pain, redness and visual disturbance.  Respiratory:  Negative for cough, shortness of breath and wheezing.   Cardiovascular:  Negative for chest pain and palpitations.  Gastrointestinal:  Negative for abdominal pain, blood in stool, constipation and diarrhea.  Endocrine: Negative for polydipsia and polyuria.  Genitourinary:  Negative for dysuria, frequency and urgency.  Musculoskeletal:  Positive for arthralgias, back pain and myalgias. Negative for joint swelling.  Skin:  Negative for pallor and rash.  Allergic/Immunologic: Negative for environmental allergies.  Neurological:  Negative for dizziness, syncope and headaches.  Hematological:  Negative for adenopathy. Does not bruise/bleed easily.  Psychiatric/Behavioral:  Negative for decreased concentration and dysphoric mood. The patient is not nervous/anxious.        Objective:   Physical Exam Constitutional:      General: She is not in acute distress.    Appearance: Normal appearance. She is well-developed and normal weight. She is not ill-appearing or diaphoretic.  HENT:     Head: Normocephalic and atraumatic.  Eyes:     Conjunctiva/sclera: Conjunctivae  normal.     Pupils: Pupils are equal, round, and reactive to light.  Neck:     Thyroid : No thyromegaly.     Vascular: No carotid bruit or JVD.  Cardiovascular:     Rate and Rhythm: Normal rate and regular rhythm.     Heart sounds: Normal heart sounds.     No gallop.  Pulmonary:     Effort: Pulmonary effort is normal. No respiratory distress.     Breath sounds: Normal breath sounds. No wheezing or rales.  Abdominal:     General: There is no distension or abdominal bruit.     Palpations: Abdomen is soft.  Musculoskeletal:     Cervical back: Normal range of motion and neck supple.     Right lower leg: No edema.     Left lower leg: No edema.     Comments: No acute joint swelling Normal rom of joints   Lymphadenopathy:     Cervical: No cervical adenopathy.  Skin:    General: Skin is warm and dry.     Coloration: Skin is not jaundiced or pale.     Findings: No bruising, erythema or rash.  Neurological:     Mental Status: She is alert.     Cranial Nerves: No cranial nerve deficit.     Motor: No weakness.     Coordination: Coordination normal.     Deep Tendon Reflexes: Reflexes are normal and symmetric. Reflexes normal.  Psychiatric:     Comments: Facial expression appears worried   Pt denies recent stress or mood change            Assessment & Plan:   Problem List Items Addressed This Visit       Other   Vitamin D  deficiency   D level today Fatigue Joint and muscle pain       Relevant Orders   VITAMIN D  25  Hydroxy (Vit-D Deficiency, Fractures)   Myalgia   With arthralgia and fatigue for about a week  Reassuring exam  Lab today   Instructed to hold statin for brief time to see if helpful       Relevant Orders   Sedimentation Rate   CK   Fatigue   Worse in past week  With joint pain and muscle pain   Menopause age Per pt hysterectomy with one ovary remaining  Never went through vasomotor symptoms   Lab today      Relevant Orders   VITAMIN D  25  Hydroxy (Vit-D Deficiency, Fractures)   Iron   CBC with Differential/Platelet   Vitamin B12   TSH   Comprehensive metabolic panel with GFR   Familial hyperlipidemia   More muscle and joint pain  Will hold pravastatin  for a week to see if this may be cause  Reviewed chart Taking Pravastatin   Repatha   Zetia   Vascepa   Co Q 10   In setting of CAD from cardiology Last LDL of 95 in epic       Arthralgia - Primary   Worse than baseline pain in joints for about a week  With fatigue and muscle pain also  No swelling joints, fever or rashes  Reassuring exam  Reviewed chart-this is new   Lab today  For myalgias-trial of holding statin as well       Relevant Orders   Sedimentation Rate   Rheumatoid factor   ANA

## 2024-01-15 NOTE — Assessment & Plan Note (Signed)
 D level today Fatigue Joint and muscle pain

## 2024-01-15 NOTE — Assessment & Plan Note (Signed)
 Worse in past week  With joint pain and muscle pain   Menopause age Per pt hysterectomy with one ovary remaining  Never went through vasomotor symptoms   Lab today

## 2024-01-15 NOTE — Assessment & Plan Note (Signed)
 Worse than baseline pain in joints for about a week  With fatigue and muscle pain also  No swelling joints, fever or rashes  Reassuring exam  Reviewed chart-this is new   Lab today  For myalgias-trial of holding statin as well

## 2024-01-15 NOTE — Patient Instructions (Signed)
 Labs today for fatigue and joint and muscle pain   Please hold your pravastatin  for a week to see if it helps your symptoms at all   Watch your temperature if you can to be sure you are not running a fever

## 2024-01-15 NOTE — Assessment & Plan Note (Signed)
 More muscle and joint pain  Will hold pravastatin  for a week to see if this may be cause  Reviewed chart Taking Pravastatin   Repatha   Zetia   Vascepa   Co Q 10   In setting of CAD from cardiology Last LDL of 95 in epic

## 2024-01-17 ENCOUNTER — Ambulatory Visit: Payer: Self-pay | Admitting: Family Medicine

## 2024-01-17 DIAGNOSIS — R768 Other specified abnormal immunological findings in serum: Secondary | ICD-10-CM

## 2024-01-17 DIAGNOSIS — M2550 Pain in unspecified joint: Secondary | ICD-10-CM

## 2024-01-18 LAB — ANTI-NUCLEAR AB-TITER (ANA TITER): ANA Titer 1: 1:40 {titer} — ABNORMAL HIGH

## 2024-01-18 LAB — ANA: Anti Nuclear Antibody (ANA): POSITIVE — AB

## 2024-01-18 LAB — RHEUMATOID FACTOR: Rheumatoid fact SerPl-aCnc: <10 [IU]/mL (ref <14)

## 2024-01-20 DIAGNOSIS — R768 Other specified abnormal immunological findings in serum: Secondary | ICD-10-CM | POA: Insufficient documentation

## 2024-01-28 ENCOUNTER — Other Ambulatory Visit: Payer: Self-pay

## 2024-01-28 MED ORDER — ICOSAPENT ETHYL 1 G PO CAPS: 2.0000 g | ORAL_CAPSULE | Freq: Two times a day (BID) | ORAL | 2 refills | Status: AC

## 2024-02-04 DIAGNOSIS — H6983 Other specified disorders of Eustachian tube, bilateral: Secondary | ICD-10-CM | POA: Diagnosis not present

## 2024-02-04 DIAGNOSIS — H90A32 Mixed conductive and sensorineural hearing loss, unilateral, left ear with restricted hearing on the contralateral side: Secondary | ICD-10-CM | POA: Diagnosis not present

## 2024-02-04 DIAGNOSIS — R42 Dizziness and giddiness: Secondary | ICD-10-CM | POA: Diagnosis not present

## 2024-02-25 ENCOUNTER — Encounter: Payer: Self-pay | Admitting: *Deleted

## 2024-03-01 ENCOUNTER — Other Ambulatory Visit: Payer: Self-pay | Admitting: Internal Medicine

## 2024-03-01 DIAGNOSIS — Z951 Presence of aortocoronary bypass graft: Secondary | ICD-10-CM

## 2024-03-01 DIAGNOSIS — E7849 Other hyperlipidemia: Secondary | ICD-10-CM

## 2024-03-01 DIAGNOSIS — E7841 Elevated Lipoprotein(a): Secondary | ICD-10-CM

## 2024-03-11 ENCOUNTER — Telehealth: Payer: Self-pay | Admitting: Family Medicine

## 2024-03-11 DIAGNOSIS — M2550 Pain in unspecified joint: Secondary | ICD-10-CM

## 2024-03-11 NOTE — Telephone Encounter (Unsigned)
 Copied from CRM #[id number]. Topic: Referral - Question >> Mar 11, 2024 12:03 PM Taleah C wrote: Reason for CRM: pt called in and stated that she has not had the best experience at the rheumatology office she was referred too. She asked if she can have another referral sent to a different. She requested to speak directly with a nurse or referrals coordinator to find the best office. Please call and advise pt.

## 2024-03-11 NOTE — Telephone Encounter (Signed)
 Is there a different radiologist that you recommend?

## 2024-03-13 NOTE — Telephone Encounter (Signed)
 There are several rheumatologist in [place].  I put in the referral for [facility].  Please check with the referral department to make sure she can get set up.  I also do not see the note from Dr. Tobie.  Please see about getting that office visit note.  Thanks.

## 2024-03-17 ENCOUNTER — Telehealth: Payer: Self-pay | Admitting: Family Medicine

## 2024-03-17 NOTE — Telephone Encounter (Signed)
 Left message to return call to our office.

## 2024-03-17 NOTE — Telephone Encounter (Signed)
 Copied from CRM #[id number]. Topic: Referral - Question >> Mar 16, 2024  2:44 PM Alfonso HERO wrote: Reason for CRM: patient is asking to be referred to a different rheum office. She was unhappy with the service she got from the office she was sent to.

## 2024-03-18 NOTE — Telephone Encounter (Addendum)
 Lvm asking pt to call back. Need to find out need to see if pt has been seen by rheumatology in [place].

## 2024-03-18 NOTE — Telephone Encounter (Signed)
 Sending to Dr Cleatus and [facility] referral group.

## 2024-03-18 NOTE — Telephone Encounter (Unsigned)
 Copied from CRM [id number]. Topic: Clinical - Medical Advice >> Mar 18, 2024  1:26 PM Anairis L wrote: Reason for CRM: Returning [name] [place], [place] , please call patient.

## 2024-03-19 ENCOUNTER — Other Ambulatory Visit: Payer: Self-pay | Admitting: Family Medicine

## 2024-03-19 DIAGNOSIS — E119 Type 2 diabetes mellitus without complications: Secondary | ICD-10-CM

## 2024-03-20 NOTE — Telephone Encounter (Signed)
 Routed this to the referral team.  Thanks.

## 2024-03-21 NOTE — Telephone Encounter (Signed)
 Faxed request, via Epic, to Dr Lady Blanch at [facility] for 12/2023 referral notes.   Will fwd note to Referral Team for update rheumatology referral in [place].

## 2024-03-23 ENCOUNTER — Encounter: Payer: Self-pay | Admitting: *Deleted

## 2024-03-23 NOTE — Telephone Encounter (Signed)
 Referral sent to [facility].

## 2024-03-23 NOTE — Telephone Encounter (Signed)
 Noted. Thanks.

## 2024-03-23 NOTE — Telephone Encounter (Signed)
 Per 03/11/24 telephone encounter,   I have already addressed this today and sent her referral to [facility].  Patient notified via Mychart letter and direct message.   03/23/24 11:52 AM   Note Referral sent to [facility].

## 2024-03-31 DIAGNOSIS — H90A32 Mixed conductive and sensorineural hearing loss, unilateral, left ear with restricted hearing on the contralateral side: Secondary | ICD-10-CM | POA: Diagnosis not present

## 2024-04-06 ENCOUNTER — Encounter (HOSPITAL_BASED_OUTPATIENT_CLINIC_OR_DEPARTMENT_OTHER): Admitting: Nurse Practitioner

## 2024-04-12 ENCOUNTER — Other Ambulatory Visit: Payer: Self-pay | Admitting: Cardiovascular Disease

## 2024-04-13 ENCOUNTER — Other Ambulatory Visit: Payer: Self-pay | Admitting: Cardiovascular Disease

## 2024-04-24 ENCOUNTER — Other Ambulatory Visit: Payer: Self-pay | Admitting: Family Medicine

## 2024-04-24 DIAGNOSIS — E119 Type 2 diabetes mellitus without complications: Secondary | ICD-10-CM

## 2024-04-27 ENCOUNTER — Other Ambulatory Visit

## 2024-04-27 ENCOUNTER — Ambulatory Visit: Payer: Self-pay | Admitting: Family Medicine

## 2024-04-27 DIAGNOSIS — E119 Type 2 diabetes mellitus without complications: Secondary | ICD-10-CM | POA: Diagnosis not present

## 2024-04-27 LAB — LIPID PANEL
Cholesterol: 122 mg/dL (ref 0–200)
HDL: 48.3 mg/dL (ref >39.00)
LDL Cholesterol: 54 mg/dL (ref 0–99)
NonHDL: 73.33
Total CHOL/HDL Ratio: 3
Triglycerides: 96 mg/dL (ref 0.0–149.0)
VLDL: 19.2 mg/dL (ref 0.0–40.0)

## 2024-04-27 LAB — COMPREHENSIVE METABOLIC PANEL WITH GFR
ALT: 20 U/L (ref 0–35)
AST: 22 U/L (ref 0–37)
Albumin: 5 g/dL (ref 3.5–5.2)
Alkaline Phosphatase: 46 U/L (ref 39–117)
BUN: 20 mg/dL (ref 6–23)
CO2: 24 meq/L (ref 19–32)
Calcium: 9.8 mg/dL (ref 8.4–10.5)
Chloride: 103 meq/L (ref 96–112)
Creatinine, Ser: 0.72 mg/dL (ref 0.40–1.20)
GFR: 93.81 mL/min (ref >60.00)
Glucose, Bld: 143 mg/dL — ABNORMAL HIGH (ref 70–99)
Potassium: 4.2 meq/L (ref 3.5–5.1)
Sodium: 139 meq/L (ref 135–145)
Total Bilirubin: 0.6 mg/dL (ref 0.2–1.2)
Total Protein: 7.4 g/dL (ref 6.0–8.3)

## 2024-04-27 LAB — MICROALBUMIN / CREATININE URINE RATIO
Creatinine,U: 146.5 mg/dL
Microalb Creat Ratio: 11.1 mg/g (ref 0.0–30.0)
Microalb, Ur: 1.6 mg/dL (ref 0.0–1.9)

## 2024-04-27 LAB — HEMOGLOBIN A1C: Hgb A1c MFr Bld: 7.4 % — ABNORMAL HIGH (ref 4.6–6.5)

## 2024-04-29 ENCOUNTER — Encounter: Payer: Self-pay | Admitting: Internal Medicine

## 2024-05-03 ENCOUNTER — Ambulatory Visit: Admitting: Family Medicine

## 2024-05-05 ENCOUNTER — Ambulatory Visit: Admitting: Family Medicine

## 2024-05-05 ENCOUNTER — Encounter: Payer: Self-pay | Admitting: Family Medicine

## 2024-05-05 VITALS — BP 98/62 | HR 70 | Temp 98.1°F | Ht 60.0 in | Wt 128.1 lb

## 2024-05-05 DIAGNOSIS — E119 Type 2 diabetes mellitus without complications: Secondary | ICD-10-CM | POA: Diagnosis not present

## 2024-05-05 DIAGNOSIS — E7849 Other hyperlipidemia: Secondary | ICD-10-CM

## 2024-05-05 DIAGNOSIS — I10 Essential (primary) hypertension: Secondary | ICD-10-CM | POA: Diagnosis not present

## 2024-05-05 DIAGNOSIS — Z7984 Long term (current) use of oral hypoglycemic drugs: Secondary | ICD-10-CM

## 2024-05-05 MED ORDER — PRAVASTATIN SODIUM 40 MG PO TABS: ORAL_TABLET | ORAL | Status: AC

## 2024-05-05 NOTE — Progress Notes (Unsigned)
 Diabetes:  Using medications without difficulties: yes, taking 1000mg  metformin  BID at baseline Hypoglycemic episodes:no Hyperglycemic episodes:no Feet problems:no Blood Sugars averaging: usually ~150 eye exam within last year: [name].   A1c 7.4.  labs d/w pt.  MALB neg.   Elevated Cholesterol: Using medications without problems: see below, still on pravastatin .   Muscle aches: yes, see below.   Diet compliance: d/w pt.  Exercise: d/w pt.  Labs d/w pt.   She is isn't lightheaded.  Lower BP noted.  D/w pt about options. Some occ BLE edema but not today.   D/w pt about rheumatology referral.   Still dealing with fatigue.  Variable amount of joint pain, elbow and hip pain.    Rheum appointment is pending .  Meds, vitals, and allergies reviewed.   ROS: Per HPI unless specifically indicated in ROS section   GEN: nad, alert and oriented HEENT: mucous membranes moist NECK: supple w/o LA CV: rrr. PULM: ctab, no inc wob ABD: soft, +bs EXT: no edema SKIN: no acute rash  Diabetic foot exam: Normal inspection No skin breakdown No calluses  Normal DP pulses Normal sensation to light touch and monofilament Nails normal

## 2024-05-05 NOTE — Patient Instructions (Addendum)
 I would stop pravastatin  for about 2-3 weeks.  Please let me know how you feel at that point.  I would try cutting spironolactone  in half and see if the fatigue is better.  Check your BP and if still significantly below 130/90 after 1 week, then let me know.   Plan on recheck at a yearly visit in about 6 months but let me know how you are feeling in the meantime.

## 2024-05-06 ENCOUNTER — Encounter: Payer: Self-pay | Admitting: Internal Medicine

## 2024-05-06 ENCOUNTER — Ambulatory Visit: Attending: Internal Medicine | Admitting: Internal Medicine

## 2024-05-06 VITALS — BP 120/64 | HR 66 | Ht 60.0 in | Wt 127.0 lb

## 2024-05-06 DIAGNOSIS — Z951 Presence of aortocoronary bypass graft: Secondary | ICD-10-CM | POA: Diagnosis not present

## 2024-05-06 DIAGNOSIS — E7841 Elevated Lipoprotein(a): Secondary | ICD-10-CM | POA: Diagnosis not present

## 2024-05-06 DIAGNOSIS — M791 Myalgia, unspecified site: Secondary | ICD-10-CM | POA: Diagnosis not present

## 2024-05-06 DIAGNOSIS — E7849 Other hyperlipidemia: Secondary | ICD-10-CM

## 2024-05-06 DIAGNOSIS — T466X5D Adverse effect of antihyperlipidemic and antiarteriosclerotic drugs, subsequent encounter: Secondary | ICD-10-CM | POA: Diagnosis not present

## 2024-05-06 NOTE — Patient Instructions (Signed)
 Medication Instructions:   STOP spironolactone    Let us  know if you going to remain OFF pravastatin    *If you need a refill on your cardiac medications before your next appointment, please call your pharmacy*  Follow-Up: At [facility], you and your health needs are our priority.  As part of our continuing mission to provide you with exceptional heart care, our providers are all part of one team.  This team includes your primary Cardiologist (physician) and Advanced Practice Providers or APPs (Physician Assistants and Nurse Practitioners) who all work together to provide you with the care you need, when you need it.  Your next appointment:    12 months with Dr. Mona   We recommend signing up for the patient portal called MyChart.  Sign up information is provided on this After Visit Summary.  MyChart is used to connect with patients for Virtual Visits (Telemedicine).  Patients are able to view lab/test results, encounter notes, upcoming appointments, etc.  Non-urgent messages can be sent to your provider as well.   To learn more about what you can do with MyChart, go to [email or web address].

## 2024-05-06 NOTE — Progress Notes (Signed)
 OFFICE NOTE  Chief Complaint:  Follow-up dyslipidemia, fatigue, joint pain  Primary Care Physician: Cleatus Arlyss RAMAN, MD  HPI:  [name] is a 53 y.o. female with a past medial history significant for emergent single-vessel coronary artery bypass grafting with a 99% ostial LAD stenosis in February 2018.  She was not found to have any other significant obstructive coronary disease at the time.  Echo in May 2018 revealed an LVEF of 40-45% and an LAD wall motion abnormality.  He is a patient of Dr. Court.  She has an extensive history of dyslipidemia and was told by her uncle when she was a child in [place] that her cholesterol was very high and that she would likely need to be on treatment.  She is adopted and does not know her birth parents, but does have a daughter who is 62 years old.  She has not had cholesterol testing to her knowledge.  She had been started on statins, but reports significant intolerance, this is to high-dose atorvastatin , rosuvastatin , and possibly others that she cannot recall.  She was then referred to start Repatha .  Her lipid profile as of July 2018 showed total cholesterol 246, triglycerides 237, HDL 35 and LDL 164.  After starting therapy on Repatha , she received 4 doses, approximately 2 months of therapy and her repeat lipid profile showed a total cholesterol 265, triglycerides 220, LDL 176 and HDL of 45.  This is suggesting that she has had no response to the PCS canine inhibitor.  She reports compliance with the medication.  She had no side effects from it.  Possible reasons for this include a PCSK9 gain of function mutation or LDL receptor mutation.  Findings are possibly suggestive of familial hyperlipidemia although her LDL is moderately elevated.  Initially the plan was to consider switching her to Praluent and she has been provided samples, however, it is likely that she will have a similar poor response.  10/30/2017  [name] returns today for follow-up of  dyslipidemia.  There is clinical concern for familial hyperlipidemia and she does carry that diagnosis.  Unfortunately as previously mentioned she did not respond to PCSK9 inhibitor.  She did proceed with genetic testing after genetic counseling by Dr. Danford Pac. 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  This finding is a pathogenic variant that does not allow the LDL receptor to fold properly and it is therefore poorly trafficked out of the golgi apparatus and rarely is expressed on the surface of the liver.  Based on this finding, it is understandable why a PCSK9 inhibitor which works by inhibiting the PCSK9 co-protein associated with surface LDL receptors would be ineffective as the total number of surface LDL receptors are extremely low.  We further discussed this mechanism and other strategies which may be helpful to lower her cholesterol.  She was again tried on pravastatin  by the advanced heart failure clinic pharmacist, however had significant myalgias with this and may have some myopathy.  She says today that she is just now being able to straighten out the fingers on her hands.  At this point there are few additional options although she remains at high risk of recurrent events.  Her most recent cholesterol from several months ago showed total cholesterol 305, triglycerides 202, HDL 47 and LDL-C of 218.  Remaining options for her include LDL apheresis, for which she is likely a good candidate however is associated with significant cost and is cumbersome and would require  her to likely travel several times a month to either [facility] or [place].  The other option would be lomitapide.  By mechanism of inhibiting MTP, lomitapide should be effective at lowering her overall lipid profile by 30 to 40%, but is associated with significant GI side effects and liver toxicity.  This will need to be monitored closely and requires prescription by a provider who has  completed the REMS program (which I am certified).  02/12/2018  [name] returns today for follow-up.  We have been working very diligently over the past several months to try to get her approval for lomitapide.  After multiple appeal she has finally been approved.  She indicates that she may be able to get this for no co-pay.  We will again need to repeat a lipid profile and liver enzymes as a baseline since is been 3 months since her last study and she would anticipate starting the medication next week.  We will need to monitor her liver enzymes very closely.  She is also made significant dietary changes at the direction of the nutritionist for Juxtapid  who is been working with her telephonically on the importance of changing her diet.  05/11/2018  [name] returns today for follow-up.  She seems to be tolerating Juxtapid , but does have some issues with joint pain which is been a longstanding problem.  We are adhering to the rems program, and she is having monthly liver enzyme tests.  She has known steatohepatitis and has had elevated liver enzymes in the past.  I am pleased to report her lipids have improved.  Her total cholesterol came down from 379-274 with triglycerides down from 300-200 and LDL from 284 to 224.  Unfortunately her liver enzymes are climbing.  Over the past 6 months her ALT is climbed from 54 to 155 and AST from 34 to 65.  The ALT is now about 5 times upper limit normal.  I discussed this with her at this point and concerned that she may not be able to tolerate the medication.  Guidelines for the manufacturer suggest decreasing the dose however she is on the lowest dose.  I discussed this further with our pharmacist Charleen Jewels, Pharm.D., who reviewed the pharmacokinetics of the drug and given its longer half-life of close to 48 hours felt that the drug could be effectively dosed every other day and perhaps still maintain steady state while reducing the possibility of side effects.  In  addition, Nathanel suffers from arthritic pain and recently has had an upper respiratory infection which worsened her symptoms.  She has been taking Tylenol  fairly regularly for this since she was once told not to take nonsteroidals due to her heart disease.  She did not realize that the Tylenol  could worsen her liver enzyme abnormalities.  01/19/2019  Zyliah is seen today in follow-up.  Overall she seems to be doing quite well.  Although we had been maintaining her on Juxtapid , I was disappointed in the minimal reduction in her lipids.  This was at the trade off of elevated liver enzymes.  Initially there was some concern about coadministration of Tylenol  which we discontinued and it did improve her numbers however the liver enzymes did not allow us  to uptitrate the medication.  Her cholesterol really did not get a meaningful reduction.  Previously it was noted that she had had some benefit although not as much as expected with a PCSK9 inhibitor.  I recommended that we restart Repatha  to see if that would be  of benefit.  In addition with the recent introduction of Nexletol , I felt this could also add to lipid lowering.  It is well-tolerated and since she has had statin side effects I felt that would be appropriate for her.  I am pleased to report she has had a significant reduction in her lipid profile.  Her LDL cholesterol is reduced from 222 down to 26.  Total cholesterol has come down to 100 from 277.  HDL is increased from 30-38 and there has been a small increase in triglycerides from 126-181.  Some of this may be dietary and related to decreased exercise that she cannot participate in cardiac rehab at this time and they are moving to [facility] therefore have been eating out more.  She had reported some cramping in her hands which is also resolved off of Juxtapid .  07/04/2019  Gabrielle is seen today in follow-up.  Unfortunately she contracted Covid in late December.  She says she was sick for about 2 weeks.   She is also fallen off of her diet and has not been doing exercise.  Cardiac rehabilitation was closed.  She recently moved and her new apartment complex has a gym.  She plans to get back to that.  This seems to track with an increase in her lipids.  She remains on Repatha .  She will need a renewal of her prescription.  When evaluating her triglycerides, they have increased steadily over the past year from 74-126 -181-212.  This also tracks a weight gain from 130 to 142 pounds.  Lipids recently were 122 total cholesterol, triglycerides 212, HDL 38 and LDL 50.  01/12/2020  Aundria is seen today for follow-up.  Overall her lipids have been fairly stable.  Cholesterol most recently was 129, triglycerides 222, HDL 35 and LDL 58.  She is tolerating triple therapy on Repatha , Nexletol  and ezetimibe .  Liver enzymes have been normal.  She reports some unusual left upper quadrant pain which she thought initially was her liver however I remind her the liver was on the right upper quadrant.  In addition she is describing some upper neck and lower occipital pain.  She wonders if this might be related to restarting her exercise.  These episodes may last for couple hours.  She has not tried nitroglycerin  for it and did not feel that it was her heart although it had some similar features to prior to her previous cardiac episodes.  02/22/2021  Lile returns today for follow-up.  I last saw her in August 2021.  Her cholesterol has still remained lower however seems to be climbing.  This is primarily dietary.  LDL now 69 up from 58.  Triglycerides are higher at 313, up from 222.  She reports compliance with the Repatha , bempedoic acid  and ezetimibe .  She also reports a decrease in physical activity which may be contributing to this.  She continues to be asymptomatic from a cardiac standpoint.  08/23/2021  Alasha returns today for follow-up.  She continues to have very good lipid control on her current combination of Repatha   and Nexletol .  Her total cholesterol 135, triglycerides 175 HDL 39 and LDL 66.  I did assess for an LP(a) which was elevated mildly at 108.3.  Probably this was higher since we expect 20-30% reduction of LP(a) on Repatha .  09/01/2022  Chasady returns today for follow-up.  Her lipids unfortunately have worsened somewhat since I last saw her.  Total cholesterol now 176, HDL 44, triglycerides 197 and LDL 98.  She reports her diet has been off.  She has been under a lot of stress with family issues and eating more fast food.  11/25/2022  Livy returns today for follow-up.  She has tried to make substantive dietary changes since we last saw her unfortunately her cholesterol is worse.  Total now 183, triglycerides 316, HDL 37 and LDL 93.  She reports infrequent use of over-the-counter fish oil may be 1 capsule a day.  Otherwise she is consistently taking her other lipid-lowering medications.  She says she is never been on any prescription fish oil in the past, however with her history of coronary artery disease that would likely be indicated at this level of triglyceride elevation.  04/14/2023  Leba returns today for follow-up.  Her cholesterol has again improved with recent labs showing total cholesterol 158, triglycerides 85, HDL 47 and LDL 95.  This is primarily due to dietary changes.  She was recently diagnosed with type 2 diabetes as her hemoglobin A1c was noted to be 10.1% in August.  Subsequently she is started on metformin  and made some dietary modifications.  Her liver enzymes have remained normal recently.  07/20/2023  Kasey he is seen today in follow-up.  She reports that the cost of her Nexletol  specifically is gone up substantially.  This is concerning for her as far as being able to continue on the medication.  She had her lipids repeated about 3 months ago.  Total cholesterol has improved down to 158 and triglycerides have substantially normalized at 85 (down from 316) with HDL 47 and LDL 95.   Overall she remains asymptomatic.  She had a viral illness at the end of January which she has been slow to recover from but is improving.  05/06/2024  Pegeen returns today for follow-up.  She has recently been struggling with diffuse joint and muscle pain as well as fatigue and weakness.  She had autoimmune workup in August which showed a weakly positive ANA with a titer of 1:40.  Otherwise workup was negative including a negative CK and sedimentation rate.  Rheumatoid factor was negative.  She was referred to rheumatology but does not have an appointment till February.  Yesterday she saw her PCP was noted to be hypotensive.  He thought that might be contributing to her fatigue and advised cutting her spironolactone  in half.  She did not take any of her medicines this morning and blood pressure initially was 120/64.  She did think she had some more energy.  It was also recommended for her to take a statin holiday.  She has been on pravastatin  for a long time although it is possible that that could be causing pain I think it is less likely.  Either way I think it is reasonable to consider a statin holiday to see if it improves her symptoms.  PMHx:  Past Medical History:  Diagnosis Date   Coronary artery disease    Headache    Heart murmur    aschild antibiotics if having Dental work   Hypocholesteremia    medical treatment   Ischemic cardiomyopathy    ef 40 to 45 %with global hypokinesia and apical aneurysm formation per 2D echo 06-13-2021   Menorrhagia    NSTEMI (non-ST elevated myocardial infarction) (HCC) 07/12/2016   Pelvic pain     Past Surgical History:  Procedure Laterality Date   ABDOMINAL HYSTERECTOMY     yrs ago   CORONARY ARTERY BYPASS GRAFT N/A 07/12/2016   Procedure: CORONARY ARTERY BYPASS  GRAFTING (CABG), ON PUMP, TIMES ONE, USING LEFT INTERNAL MAMMARY ARTERY WITH TEE;  Surgeon: Dorise MARLA Fellers, MD;  Location: [facility] OR;  Service: Open Heart Surgery;  Laterality: N/A;  LIMA to LAD    goiter     s/p partial thryoid removal.   INGUINAL HERNIA REPAIR Bilateral 01/22/2016   Procedure: LAPAROSCOPIC BILATERAL FEMORAL AND RIGHT INGUINAL HERNIA REPAIRWITH INSERTION OF MESH;  Surgeon: Elspeth Schultze, MD;  Location: [facility] ORS;  Service: General;  Laterality: Bilateral;   LEFT HEART CATH AND CORONARY ANGIOGRAPHY N/A 07/12/2016   Procedure: Left Heart Cath and Coronary Angiography;  Surgeon: Dorn JINNY Lesches, MD;  Location: [facility] INVASIVE CV LAB;  Service: Cardiovascular;  Laterality: N/A;   LEFT HEART CATH AND CORS/GRAFTS ANGIOGRAPHY N/A 03/08/2020   Procedure: LEFT HEART CATH AND CORS/GRAFTS ANGIOGRAPHY;  Surgeon: Lesches Dorn JINNY, MD;  Location: [facility] INVASIVE CV LAB;  Service: Cardiovascular;  Laterality: N/A;   MASS EXCISION N/A 01/08/2023   Procedure: REMOVAL OF SUBCUTANEOUS MASSES ON SCALP;  Surgeon: Schultze Elspeth, MD;  Location: [facility];  Service: General;  Laterality: N/A;  GEN/LMA   SVD     x 1   WISDOM TOOTH EXTRACTION      FAMHx:  Family History  Adopted: Yes    SOCHx:   reports that she has never smoked. She has never used smokeless tobacco. She reports that she does not drink alcohol and does not use drugs.  ALLERGIES:  Allergies  Allergen Reactions   Statins Other (See Comments)    Muscle aches with higher doses of: Crestor  & Lipitor    Bactrim Itching and Rash         ROS: Pertinent items noted in HPI and remainder of comprehensive ROS otherwise negative.  HOME MEDS: Current Outpatient Medications on File Prior to Visit  Medication Sig Dispense Refill   aspirin  81 MG tablet Take 1 tablet (81 mg total) by mouth daily.     Cholecalciferol (VITAMIN D3) 50 MCG (2000 UT) capsule Take 1 capsule (2,000 Units total) by mouth daily.     Coenzyme Q10 (CO Q-10) 200 MG CAPS 1 capsule with a meal     Evolocumab  (REPATHA  SURECLICK) 140 MG/ML SOAJ INJECT 140 MG UNDER THE SKIN EVERY TWO WEEKS INTO THE THIGH, STOMACH, OR UPPER ARM 6 mL 1   ezetimibe   (ZETIA ) 10 MG tablet TAKE ONE TABLET BY MOUTH ONE TIME DAILY 90 tablet 2   icosapent  Ethyl (VASCEPA ) 1 g capsule Take 2 capsules (2 g total) by mouth 2 (two) times daily. 360 capsule 2   isosorbide  mononitrate (IMDUR ) 30 MG 24 hr tablet Take 0.5 tablets (15 mg total) by mouth daily. 45 tablet 2   losartan  (COZAAR ) 25 MG tablet TAKE ONE TABLET BY MOUTH ONE TIME DAILY 90 tablet 2   metFORMIN  (GLUCOPHAGE ) 500 MG tablet TAKE ONE TO TWO TABLETS BY MOUTH TWICE A DAY WITH MEALS 360 tablet 0   metoprolol  succinate (TOPROL -XL) 25 MG 24 hr tablet TAKE ONE TABLET BY MOUTH ONE TIME DAILY 90 tablet 3   nitroGLYCERIN  (NITROSTAT ) 0.4 MG SL tablet PLACE ONE TABLET UNDER THE TONGUE AT ONSET OF CHEST PAIN. MAY REPEAT EVERY 5 MINUTES AS NEEDED FOR CHEST PAIN UP TO 3 TABLETS IN 15 MINUTES. IF NO RELIEF AFTER 5 MINUTES, CALL 911. 25 tablet 5   spironolactone  (ALDACTONE ) 25 MG tablet Take 0.5 tablets (12.5 mg total) by mouth daily.     pravastatin  (PRAVACHOL ) 40 MG tablet Held as of 05/05/2024 (Patient not  taking: Reported on 05/06/2024)     No current facility-administered medications on file prior to visit.    LABS/IMAGING: No results found for this or any previous visit (from the past 48 hours). No results found.  LIPID PANEL:    Component Value Date/Time   CHOL 122 04/27/2024 0820   CHOL 158 04/03/2023 0931   TRIG 96.0 04/27/2024 0820   HDL 48.30 04/27/2024 0820   HDL 47 04/03/2023 0931   CHOLHDL 3 04/27/2024 0820   VLDL 19.2 04/27/2024 0820   LDLCALC 54 04/27/2024 0820   LDLCALC 95 04/03/2023 0931     WEIGHTS: Wt Readings from Last 3 Encounters:  05/06/24 127 lb (57.6 kg)  05/05/24 128 lb 2 oz (58.1 kg)  01/15/24 127 lb (57.6 kg)    VITALS: BP 120/64   Pulse 66   Ht 5' (1.524 m)   Wt 127 lb (57.6 kg)   LMP 08/10/2011   SpO2 98%   BMI 24.80 kg/m   EXAM: Deferred  EKG: Deferred  ASSESSMENT: Coronary artery disease status post single-vessel CABG to the LAD Ischemic cardiomyopathy,  EF 40-45% HeFH (heterozygous for a change in LDLR (c.1103G>A, p.Cys368Tyr) Documented statin intolerance Hepatic steatosis-elevated liver enzymes Elevated LP(a) at 108.3  PLAN: 1.   Mrs. Pellicane has had worsening fatigue and polymyalgia as well as arthritic symptoms.  She scheduled to see rheumatology in February.  She had a weakly positive ANA but I think it is unlikely that that is significant.  She could have a stat related myalgia but it seems unlikely.  Her CK was negative.  I agree with holding pravastatin .  She had previously been on Nexletol  but her cholesterols been good without that and she stopped it last in May due to cost issues.  If she does not tolerate the pravastatin  immediately if her pain improves after statin holiday we could consider switching her to Nexletol  and continue her other lipid-lowering medications.  That would likely substitute for it.  Follow-up with me annually or sooner as necessary.  Vinie KYM Maxcy, MD, [facility], FNLA, FACP  [place]  [facility]  Medical Director of the Advanced Lipid Disorders &  Cardiovascular Risk Reduction Clinic Diplomate of the American Board of Clinical Lipidology Attending Cardiologist  Direct Dial: [phone]  Fax: [phone]  Website:  [email or web address]   Vinie BROCKS Chandrea Zellman 05/06/2024, 8:21 AM

## 2024-05-08 NOTE — Assessment & Plan Note (Signed)
 Would stop pravastatin  for about 2-3 weeks.  She can let me know how she feels at that point.

## 2024-05-08 NOTE — Assessment & Plan Note (Signed)
 A1c 7.4.  labs d/w pt.  MALB neg.  Can recheck at a yearly visit in about 6 months. Continue work on diet and exercise.  Continue metformin .

## 2024-05-08 NOTE — Assessment & Plan Note (Signed)
 I would try cutting spironolactone  in half and see if the fatigue is better.  Check BP and if still significantly below 130/90 after 1 week, then let me know.

## 2024-06-24 ENCOUNTER — Other Ambulatory Visit: Payer: Self-pay | Admitting: Family Medicine

## 2024-06-24 DIAGNOSIS — E119 Type 2 diabetes mellitus without complications: Secondary | ICD-10-CM

## 2024-06-30 NOTE — Progress Notes (Unsigned)
 "  Office Visit Note  Patient: [name]             Date of Birth: July 04, 1970           MRN: [id number]             PCP: Cleatus Arlyss RAMAN, MD Referring: Cleatus Arlyss RAMAN, MD Visit Date: 07/13/2024 Occupation: Data Unavailable  Subjective:  No chief complaint on file.   History of Present Illness: [name] is a 54 y.o. female ***     Activities of Daily Living:  Patient reports morning stiffness for *** {minute/hour:19697}.   Patient {ACTIONS;DENIES/REPORTS:21021675::Denies} nocturnal pain.  Difficulty dressing/grooming: {ACTIONS;DENIES/REPORTS:21021675::Denies} Difficulty climbing stairs: {ACTIONS;DENIES/REPORTS:21021675::Denies} Difficulty getting out of chair: {ACTIONS;DENIES/REPORTS:21021675::Denies} Difficulty using hands for taps, buttons, cutlery, and/or writing: {ACTIONS;DENIES/REPORTS:21021675::Denies}  No Rheumatology ROS completed.   PMFS History:  Patient Active Problem List   Diagnosis Date Noted   Elevated antinuclear antibody (ANA) level 01/20/2024   Fatigue 01/15/2024   Arthralgia 01/15/2024   Myalgia 01/15/2024   History of goiter 08/30/2023   Vitamin D  deficiency 05/03/2023   Pilar cysts 02/02/2023   Diabetes mellitus without complication (HCC) 01/08/2023   Cardiomyopathy (HCC) 11/11/2022   Scalp mass 11/11/2022   Abnormal nuclear stress test    Hypertension 09/02/2018   Chest pain of uncertain etiology 09/01/2018   Trigger thumb, right thumb 08/12/2018   Elevated liver enzymes 04/01/2018   CAD in native artery 04/24/2017   Fatigue due to treatment 04/03/2017   Ischemic cardiomyopathy 03/17/2017   Familial hyperlipidemia 12/26/2016   S/P CABG x 1 07/12/2016   NSTEMI (non-ST elevated myocardial infarction) (HCC) 07/11/2016    Past Medical History:  Diagnosis Date   Coronary artery disease    Headache    Heart murmur    aschild antibiotics if having Dental work   Hypocholesteremia    medical treatment   Ischemic  cardiomyopathy    ef 40 to 45 %with global hypokinesia and apical aneurysm formation per 2D echo 06-13-2021   Menorrhagia    NSTEMI (non-ST elevated myocardial infarction) (HCC) 07/12/2016   Pelvic pain     Family History  Adopted: Yes   Past Surgical History:  Procedure Laterality Date   ABDOMINAL HYSTERECTOMY     yrs ago   CORONARY ARTERY BYPASS GRAFT N/A 07/12/2016   Procedure: CORONARY ARTERY BYPASS GRAFTING (CABG), ON PUMP, TIMES ONE, USING LEFT INTERNAL MAMMARY ARTERY WITH TEE;  Surgeon: Dorise MARLA Fellers, MD;  Location: [facility] OR;  Service: Open Heart Surgery;  Laterality: N/A;  LIMA to LAD   goiter     s/p partial thryoid removal.   INGUINAL HERNIA REPAIR Bilateral 01/22/2016   Procedure: LAPAROSCOPIC BILATERAL FEMORAL AND RIGHT INGUINAL HERNIA REPAIRWITH INSERTION OF MESH;  Surgeon: Elspeth Schultze, MD;  Location: [facility];  Service: General;  Laterality: Bilateral;   LEFT HEART CATH AND CORONARY ANGIOGRAPHY N/A 07/12/2016   Procedure: Left Heart Cath and Coronary Angiography;  Surgeon: Dorn JINNY Lesches, MD;  Location[facility] INVASIVE CV LAB;  Service: Cardiovascular;  Laterality: N/A;   LEFT HEART CATH AND CORS/GRAFTS ANGIOGRAPHY N/A 03/08/2020   Procedure: LEFT HEART CATH AND CORS/GRAFTS ANGIOGRAPHY;  Surgeon: Lesches Dorn JINNY, MD;  Location: [facility] INVASIVE CV LAB;  Service: Cardiovascular;  Laterality: N/A;   MASS EXCISION N/A 01/08/2023   Procedure: REMOVAL OF SUBCUTANEOUS MASSES ON SCALP;  Surgeon: Schultze Elspeth, MD;  Location: [facility];  Service: General;  Laterality: N/A;  GEN/LMA   SVD     x 1  WISDOM TOOTH EXTRACTION     Social History[1] Social History   Social History Narrative   Married 1996.   Adult daughter born 1999, in graduate school at [employer] for speech pathology.   From [place].   Speaks Spanish and English.   Art therapist at [employer].      Grandson Arlen born 2025.       Immunization History  Administered Date(s)  Administered   PFIZER(Purple Top)SARS-COV-2 Vaccination 09/30/2019, 11/01/2019     Objective: Vital Signs: LMP 08/10/2011    Physical Exam   Musculoskeletal Exam: ***  CDAI Exam: CDAI Score: -- Patient Global: --; Provider Global: -- Swollen: --; Tender: -- Joint Exam 07/13/2024   No joint exam has been documented for this visit   There is currently no information documented on the homunculus. Go to the Rheumatology activity and complete the homunculus joint exam.  Investigation: No additional findings.  Imaging: No results found.  Recent Labs: Lab Results  Component Value Date   WBC 5.9 01/15/2024   HGB 12.8 01/15/2024   PLT 273.0 01/15/2024   NA 139 04/27/2024   K 4.2 04/27/2024   CL 103 04/27/2024   CO2 24 04/27/2024   GLUCOSE 143 (H) 04/27/2024   BUN 20 04/27/2024   CREATININE 0.72 04/27/2024   BILITOT 0.6 04/27/2024   ALKPHOS 46 04/27/2024   AST 22 04/27/2024   ALT 20 04/27/2024   PROT 7.4 04/27/2024   ALBUMIN  5.0 04/27/2024   CALCIUM  9.8 04/27/2024   GFRAA 108 03/06/2020    Speciality Comments: No specialty comments available.  Procedures:  No procedures performed Allergies: Statins and Bactrim   Assessment / Plan:     Visit Diagnoses: No diagnosis found.  Orders: No orders of the defined types were placed in this encounter.  No orders of the defined types were placed in this encounter.   Face-to-face time spent with patient was *** minutes. Greater than 50% of time was spent in counseling and coordination of care.  Follow-Up Instructions: No follow-ups on file.   Alfonso Patterson, LPN  Note - This record has been created using [employer].  Chart creation errors have been sought, but may not always  have been located. Such creation errors do not reflect on  the standard of medical care.    [1]  Social History Tobacco Use   Smoking status: Never   Smokeless tobacco: Never  Vaping Use   Vaping status: Never Used  Substance Use  Topics   Alcohol use: No   Drug use: No   "

## 2024-07-13 ENCOUNTER — Ambulatory Visit

## 2024-10-26 ENCOUNTER — Other Ambulatory Visit

## 2024-11-04 ENCOUNTER — Encounter: Admitting: Family Medicine

## 2024-11-14 ENCOUNTER — Ambulatory Visit: Admitting: Cardiovascular Disease
